# Patient Record
Sex: Male | Born: 1935 | Race: White | Hispanic: No | Marital: Married | Smoking: Never smoker
Health system: Southern US, Community
[De-identification: ages and names within clinical notes are randomized; demographics above are authoritative.]

## PROBLEM LIST (undated history)

## (undated) ENCOUNTER — Emergency Department (HOSPITAL_COMMUNITY): Discharge status: Against Medical Advice | Payer: Medicare Other

## (undated) DIAGNOSIS — I2510 Atherosclerotic heart disease of native coronary artery without angina pectoris: Secondary | ICD-10-CM

## (undated) DIAGNOSIS — E119 Type 2 diabetes mellitus without complications: Secondary | ICD-10-CM

## (undated) HISTORY — PX: SHOULDER SURGERY: SHX246

---

## 2011-11-27 ENCOUNTER — Emergency Department: Payer: Self-pay | Admitting: Emergency Medicine

## 2011-12-07 ENCOUNTER — Emergency Department: Payer: Self-pay | Admitting: Emergency Medicine

## 2013-08-27 ENCOUNTER — Inpatient Hospital Stay: Payer: Self-pay | Admitting: Internal Medicine

## 2013-08-27 LAB — BASIC METABOLIC PANEL
Anion Gap: 7 (ref 7–16)
BUN: 12 mg/dL (ref 7–18)
Calcium, Total: 9.1 mg/dL (ref 8.5–10.1)
Chloride: 106 mmol/L (ref 98–107)
Co2: 26 mmol/L (ref 21–32)
Creatinine: 1.22 mg/dL (ref 0.60–1.30)
EGFR (African American): >60
EGFR (Non-African Amer.): 57 — ABNORMAL LOW
Glucose: 154 mg/dL — ABNORMAL HIGH (ref 65–99)
Osmolality: 280 (ref 275–301)
Potassium: 4.2 mmol/L (ref 3.5–5.1)
Sodium: 139 mmol/L (ref 136–145)

## 2013-08-27 LAB — CBC WITH DIFFERENTIAL/PLATELET
Basophil #: 0 10*3/uL (ref 0.0–0.1)
Basophil %: 0.3 %
Eosinophil #: 0 10*3/uL (ref 0.0–0.7)
Eosinophil %: 0.4 %
HCT: 40.3 % (ref 40.0–52.0)
HGB: 12.9 g/dL — ABNORMAL LOW (ref 13.0–18.0)
Lymphocyte #: 0.4 10*3/uL — ABNORMAL LOW (ref 1.0–3.6)
Lymphocyte %: 4.3 %
MCH: 31.3 pg (ref 26.0–34.0)
MCHC: 32.1 g/dL (ref 32.0–36.0)
MCV: 98 fL (ref 80–100)
Monocyte #: 0.2 x10 3/mm (ref 0.2–1.0)
Monocyte %: 1.6 %
Neutrophil #: 8.9 10*3/uL — ABNORMAL HIGH (ref 1.4–6.5)
Neutrophil %: 93.4 %
Platelet: 180 10*3/uL (ref 150–440)
RBC: 4.13 10*6/uL — ABNORMAL LOW (ref 4.40–5.90)
RDW: 14.8 % — ABNORMAL HIGH (ref 11.5–14.5)
WBC: 9.6 10*3/uL (ref 3.8–10.6)

## 2013-08-27 LAB — TROPONIN I
Troponin-I: <0.02 ng/mL
Troponin-I: 0.04 ng/mL

## 2013-08-27 LAB — URINALYSIS, COMPLETE
Bilirubin,UR: NEGATIVE
Glucose,UR: >=500 mg/dL (ref 0–75)
Ketone: NEGATIVE
Leukocyte Esterase: NEGATIVE
Nitrite: NEGATIVE
Ph: 5 (ref 4.5–8.0)
Protein: NEGATIVE
RBC,UR: 2 /HPF (ref 0–5)
Specific Gravity: 1.021 (ref 1.003–1.030)
Squamous Epithelial: NONE SEEN
WBC UR: 1 /HPF (ref 0–5)

## 2013-08-27 LAB — CK-MB
CK-MB: 4 ng/mL — ABNORMAL HIGH (ref 0.5–3.6)
CK-MB: 1.4 ng/mL (ref 0.5–3.6)

## 2013-08-27 LAB — TSH: Thyroid Stimulating Horm: 2.07 u[IU]/mL

## 2013-08-27 LAB — MAGNESIUM: Magnesium: 1.4 mg/dL — ABNORMAL LOW

## 2013-08-28 LAB — CBC WITH DIFFERENTIAL/PLATELET
Basophil #: 0 10*3/uL (ref 0.0–0.1)
Basophil %: 0.2 %
Eosinophil #: 0 10*3/uL (ref 0.0–0.7)
Eosinophil %: 0 %
HCT: 34.9 % — ABNORMAL LOW (ref 40.0–52.0)
HGB: 11.4 g/dL — ABNORMAL LOW (ref 13.0–18.0)
Lymphocyte #: 0.5 10*3/uL — ABNORMAL LOW (ref 1.0–3.6)
Lymphocyte %: 3.7 %
MCH: 31.4 pg (ref 26.0–34.0)
MCHC: 32.6 g/dL (ref 32.0–36.0)
MCV: 96 fL (ref 80–100)
Monocyte #: 0.5 x10 3/mm (ref 0.2–1.0)
Monocyte %: 3.6 %
Neutrophil #: 13.2 10*3/uL — ABNORMAL HIGH (ref 1.4–6.5)
Neutrophil %: 92.5 %
Platelet: 140 10*3/uL — ABNORMAL LOW (ref 150–440)
RBC: 3.62 10*6/uL — ABNORMAL LOW (ref 4.40–5.90)
RDW: 14.8 % — ABNORMAL HIGH (ref 11.5–14.5)
WBC: 14.3 10*3/uL — ABNORMAL HIGH (ref 3.8–10.6)

## 2013-08-28 LAB — URINALYSIS, COMPLETE
Bilirubin,UR: NEGATIVE
Glucose,UR: 150 mg/dL (ref 0–75)
Hyaline Cast: 5
Ketone: NEGATIVE
Leukocyte Esterase: NEGATIVE
Nitrite: NEGATIVE
Ph: 5 (ref 4.5–8.0)
Protein: 30
RBC,UR: 6 /HPF (ref 0–5)
Specific Gravity: 1.018 (ref 1.003–1.030)
Squamous Epithelial: <1
WBC UR: 6 /HPF (ref 0–5)

## 2013-08-28 LAB — BASIC METABOLIC PANEL
Anion Gap: 11 (ref 7–16)
BUN: 18 mg/dL (ref 7–18)
Calcium, Total: 8.2 mg/dL — ABNORMAL LOW (ref 8.5–10.1)
Chloride: 106 mmol/L (ref 98–107)
Co2: 22 mmol/L (ref 21–32)
Creatinine: 1.49 mg/dL — ABNORMAL HIGH (ref 0.60–1.30)
EGFR (African American): 52 — ABNORMAL LOW
EGFR (Non-African Amer.): 45 — ABNORMAL LOW
Glucose: 162 mg/dL — ABNORMAL HIGH (ref 65–99)
Osmolality: 283 (ref 275–301)
Potassium: 3.5 mmol/L (ref 3.5–5.1)
Sodium: 139 mmol/L (ref 136–145)

## 2013-08-28 LAB — LIPID PANEL
Cholesterol: 89 mg/dL (ref 0–200)
HDL Cholesterol: 46 mg/dL (ref 40–60)
Ldl Cholesterol, Calc: 24 mg/dL (ref 0–100)
Triglycerides: 96 mg/dL (ref 0–200)
VLDL Cholesterol, Calc: 19 mg/dL (ref 5–40)

## 2013-08-28 LAB — TROPONIN I: Troponin-I: 0.09 ng/mL — ABNORMAL HIGH

## 2013-08-28 LAB — TSH: Thyroid Stimulating Horm: 1.44 u[IU]/mL

## 2013-08-28 LAB — APTT
Activated PTT: 39.2 secs — ABNORMAL HIGH (ref 23.6–35.9)
Activated PTT: >160 secs (ref 23.6–35.9)

## 2013-08-28 LAB — CK-MB: CK-MB: 1.2 ng/mL (ref 0.5–3.6)

## 2013-08-29 LAB — BASIC METABOLIC PANEL
Anion Gap: 14 (ref 7–16)
BUN: 30 mg/dL — ABNORMAL HIGH (ref 7–18)
Calcium, Total: 8 mg/dL — ABNORMAL LOW (ref 8.5–10.1)
Chloride: 103 mmol/L (ref 98–107)
Co2: 19 mmol/L — ABNORMAL LOW (ref 21–32)
Creatinine: 1.76 mg/dL — ABNORMAL HIGH (ref 0.60–1.30)
EGFR (African American): 42 — ABNORMAL LOW
EGFR (Non-African Amer.): 36 — ABNORMAL LOW
Glucose: 154 mg/dL — ABNORMAL HIGH (ref 65–99)
Osmolality: 281 (ref 275–301)
Potassium: 3.2 mmol/L — ABNORMAL LOW (ref 3.5–5.1)
Sodium: 136 mmol/L (ref 136–145)

## 2013-08-29 LAB — CBC WITH DIFFERENTIAL/PLATELET
Basophil #: 0 10*3/uL (ref 0.0–0.1)
Basophil %: 0.3 %
Eosinophil #: 0 10*3/uL (ref 0.0–0.7)
Eosinophil %: 0 %
HCT: 35.9 % — ABNORMAL LOW (ref 40.0–52.0)
HGB: 12 g/dL — ABNORMAL LOW (ref 13.0–18.0)
Lymphocyte #: 0.6 10*3/uL — ABNORMAL LOW (ref 1.0–3.6)
Lymphocyte %: 5.6 %
MCH: 31.7 pg (ref 26.0–34.0)
MCHC: 33.3 g/dL (ref 32.0–36.0)
MCV: 95 fL (ref 80–100)
Monocyte #: 0.3 x10 3/mm (ref 0.2–1.0)
Monocyte %: 2.8 %
Neutrophil #: 9.5 10*3/uL — ABNORMAL HIGH (ref 1.4–6.5)
Neutrophil %: 91.3 %
Platelet: 84 10*3/uL — ABNORMAL LOW (ref 150–440)
RBC: 3.77 10*6/uL — ABNORMAL LOW (ref 4.40–5.90)
RDW: 14.9 % — ABNORMAL HIGH (ref 11.5–14.5)
WBC: 10.4 10*3/uL (ref 3.8–10.6)

## 2013-08-29 LAB — APTT
Activated PTT: >160 secs (ref 23.6–35.9)
Activated PTT: >160 secs (ref 23.6–35.9)

## 2013-08-29 LAB — CLOSTRIDIUM DIFFICILE(ARMC)

## 2013-08-29 LAB — PLATELET COUNT: Platelet: 71 10*3/uL — ABNORMAL LOW (ref 150–440)

## 2013-08-29 LAB — MAGNESIUM: Magnesium: 2 mg/dL

## 2013-08-30 LAB — CBC WITH DIFFERENTIAL/PLATELET
Basophil #: 0 10*3/uL (ref 0.0–0.1)
Basophil %: 0.2 %
Eosinophil #: 0.3 10*3/uL (ref 0.0–0.7)
Eosinophil %: 2.1 %
HCT: 37.6 % — ABNORMAL LOW (ref 40.0–52.0)
HGB: 12.3 g/dL — ABNORMAL LOW (ref 13.0–18.0)
Lymphocyte #: 1.1 10*3/uL (ref 1.0–3.6)
Lymphocyte %: 8 %
MCH: 31.2 pg (ref 26.0–34.0)
MCHC: 32.7 g/dL (ref 32.0–36.0)
MCV: 96 fL (ref 80–100)
Monocyte #: 0.6 x10 3/mm (ref 0.2–1.0)
Monocyte %: 4.1 %
Neutrophil #: 11.5 10*3/uL — ABNORMAL HIGH (ref 1.4–6.5)
Neutrophil %: 85.6 %
Platelet: 67 10*3/uL — ABNORMAL LOW (ref 150–440)
RBC: 3.94 10*6/uL — ABNORMAL LOW (ref 4.40–5.90)
RDW: 14.7 % — ABNORMAL HIGH (ref 11.5–14.5)
WBC: 13.5 10*3/uL — ABNORMAL HIGH (ref 3.8–10.6)

## 2013-08-30 LAB — BASIC METABOLIC PANEL
Anion Gap: 8 (ref 7–16)
BUN: 28 mg/dL — ABNORMAL HIGH (ref 7–18)
Calcium, Total: 8.3 mg/dL — ABNORMAL LOW (ref 8.5–10.1)
Chloride: 107 mmol/L (ref 98–107)
Co2: 24 mmol/L (ref 21–32)
Creatinine: 1.28 mg/dL (ref 0.60–1.30)
EGFR (African American): >60
EGFR (Non-African Amer.): 54 — ABNORMAL LOW
Glucose: 158 mg/dL — ABNORMAL HIGH (ref 65–99)
Osmolality: 286 (ref 275–301)
Potassium: 3.8 mmol/L (ref 3.5–5.1)
Sodium: 139 mmol/L (ref 136–145)

## 2013-08-31 LAB — CBC WITH DIFFERENTIAL/PLATELET
Bands: 6 %
Bands: 7 %
Comment - H1-Com4: NORMAL
Comment - H1-Com5: NORMAL
HCT: 35.2 % — ABNORMAL LOW (ref 40.0–52.0)
HCT: 34.4 % — ABNORMAL LOW (ref 40.0–52.0)
HGB: 11.8 g/dL — ABNORMAL LOW (ref 13.0–18.0)
HGB: 11.4 g/dL — ABNORMAL LOW (ref 13.0–18.0)
Lymphocytes: 14 %
Lymphocytes: 8 %
MCH: 32.3 pg (ref 26.0–34.0)
MCH: 31.2 pg (ref 26.0–34.0)
MCHC: 33.6 g/dL (ref 32.0–36.0)
MCHC: 33.1 g/dL (ref 32.0–36.0)
MCV: 96 fL (ref 80–100)
MCV: 94 fL (ref 80–100)
Metamyelocyte: 1 %
Monocytes: 4 %
Monocytes: 2 %
Myelocyte: 1 %
Myelocyte: 1 %
NRBC/100 WBC: 1 /
Platelet: 63 10*3/uL — ABNORMAL LOW (ref 150–440)
Platelet: 68 10*3/uL — ABNORMAL LOW (ref 150–440)
RBC: 3.66 10*6/uL — ABNORMAL LOW (ref 4.40–5.90)
RBC: 3.65 10*6/uL — ABNORMAL LOW (ref 4.40–5.90)
RDW: 14.9 % — ABNORMAL HIGH (ref 11.5–14.5)
RDW: 14.7 % — ABNORMAL HIGH (ref 11.5–14.5)
Segmented Neutrophils: 74 %
Segmented Neutrophils: 82 %
WBC: 17.2 10*3/uL — ABNORMAL HIGH (ref 3.8–10.6)
WBC: 17.4 10*3/uL — ABNORMAL HIGH (ref 3.8–10.6)

## 2013-08-31 LAB — BASIC METABOLIC PANEL
Anion Gap: 9 (ref 7–16)
BUN: 28 mg/dL — ABNORMAL HIGH (ref 7–18)
Calcium, Total: 7.7 mg/dL — ABNORMAL LOW (ref 8.5–10.1)
Chloride: 110 mmol/L — ABNORMAL HIGH (ref 98–107)
Co2: 23 mmol/L (ref 21–32)
Creatinine: 1.11 mg/dL (ref 0.60–1.30)
EGFR (African American): >60
EGFR (Non-African Amer.): >60
Glucose: 173 mg/dL — ABNORMAL HIGH (ref 65–99)
Osmolality: 293 (ref 275–301)
Potassium: 4 mmol/L (ref 3.5–5.1)
Sodium: 142 mmol/L (ref 136–145)

## 2013-08-31 LAB — PRO B NATRIURETIC PEPTIDE: B-Type Natriuretic Peptide: 13978 pg/mL — ABNORMAL HIGH (ref 0–450)

## 2013-08-31 LAB — TROPONIN I
Troponin-I: 0.17 ng/mL — ABNORMAL HIGH
Troponin-I: 0.2 ng/mL — ABNORMAL HIGH

## 2013-08-31 LAB — CULTURE, BLOOD (SINGLE)

## 2013-09-01 LAB — CBC WITH DIFFERENTIAL/PLATELET
Bands: 3 %
HCT: 34 % — ABNORMAL LOW (ref 40.0–52.0)
HGB: 11.3 g/dL — ABNORMAL LOW (ref 13.0–18.0)
Lymphocytes: 12 %
MCH: 31.9 pg (ref 26.0–34.0)
MCHC: 33.3 g/dL (ref 32.0–36.0)
MCV: 96 fL (ref 80–100)
Metamyelocyte: 1 %
Monocytes: 3 %
Myelocyte: 1 %
Platelet: 64 10*3/uL — ABNORMAL LOW (ref 150–440)
RBC: 3.55 10*6/uL — ABNORMAL LOW (ref 4.40–5.90)
RDW: 14.7 % — ABNORMAL HIGH (ref 11.5–14.5)
Segmented Neutrophils: 80 %
WBC: 17.4 10*3/uL — ABNORMAL HIGH (ref 3.8–10.6)

## 2013-09-01 LAB — BASIC METABOLIC PANEL
Anion Gap: 4 — ABNORMAL LOW (ref 7–16)
BUN: 30 mg/dL — ABNORMAL HIGH (ref 7–18)
Calcium, Total: 8.1 mg/dL — ABNORMAL LOW (ref 8.5–10.1)
Chloride: 115 mmol/L — ABNORMAL HIGH (ref 98–107)
Co2: 26 mmol/L (ref 21–32)
Creatinine: 1.4 mg/dL — ABNORMAL HIGH (ref 0.60–1.30)
EGFR (African American): 56 — ABNORMAL LOW
EGFR (Non-African Amer.): 48 — ABNORMAL LOW
Glucose: 195 mg/dL — ABNORMAL HIGH (ref 65–99)
Osmolality: 300 (ref 275–301)
Potassium: 3.4 mmol/L — ABNORMAL LOW (ref 3.5–5.1)
Sodium: 145 mmol/L (ref 136–145)

## 2013-09-01 LAB — TROPONIN I: Troponin-I: 0.17 ng/mL — ABNORMAL HIGH

## 2013-09-01 LAB — MAGNESIUM: Magnesium: 2.6 mg/dL — ABNORMAL HIGH

## 2013-09-03 LAB — BASIC METABOLIC PANEL
Anion Gap: 5 — ABNORMAL LOW (ref 7–16)
BUN: 35 mg/dL — ABNORMAL HIGH (ref 7–18)
Calcium, Total: 9.2 mg/dL (ref 8.5–10.1)
Chloride: 126 mmol/L — ABNORMAL HIGH (ref 98–107)
Co2: 29 mmol/L (ref 21–32)
Creatinine: 1.5 mg/dL — ABNORMAL HIGH (ref 0.60–1.30)
EGFR (African American): 51 — ABNORMAL LOW
EGFR (Non-African Amer.): 44 — ABNORMAL LOW
Glucose: 226 mg/dL — ABNORMAL HIGH (ref 65–99)
Osmolality: 332 (ref 275–301)
Potassium: 3.6 mmol/L (ref 3.5–5.1)
Sodium: 160 mmol/L (ref 136–145)

## 2013-09-03 LAB — CBC WITH DIFFERENTIAL/PLATELET
Basophil #: 0 10*3/uL (ref 0.0–0.1)
Basophil %: 0.2 %
Eosinophil #: 0.1 10*3/uL (ref 0.0–0.7)
Eosinophil %: 0.5 %
HCT: 41.1 % (ref 40.0–52.0)
HGB: 13.7 g/dL (ref 13.0–18.0)
Lymphocyte #: 2.7 10*3/uL (ref 1.0–3.6)
Lymphocyte %: 15.1 %
MCH: 31.7 pg (ref 26.0–34.0)
MCHC: 33.3 g/dL (ref 32.0–36.0)
MCV: 95 fL (ref 80–100)
Monocyte #: 1.3 x10 3/mm — ABNORMAL HIGH (ref 0.2–1.0)
Monocyte %: 7.1 %
Neutrophil #: 13.5 10*3/uL — ABNORMAL HIGH (ref 1.4–6.5)
Neutrophil %: 77.1 %
Platelet: 125 10*3/uL — ABNORMAL LOW (ref 150–440)
RBC: 4.31 10*6/uL — ABNORMAL LOW (ref 4.40–5.90)
RDW: 15.4 % — ABNORMAL HIGH (ref 11.5–14.5)
WBC: 17.6 10*3/uL — ABNORMAL HIGH (ref 3.8–10.6)

## 2013-09-03 LAB — CULTURE, BLOOD (SINGLE)

## 2013-09-03 LAB — SODIUM: Sodium: 159 mmol/L — ABNORMAL HIGH (ref 136–145)

## 2013-09-04 LAB — SODIUM: Sodium: 159 mmol/L — ABNORMAL HIGH

## 2013-09-04 LAB — CBC WITH DIFFERENTIAL/PLATELET
Basophil #: 0.1 10*3/uL (ref 0.0–0.1)
Basophil %: 0.4 %
Eosinophil #: 0.2 10*3/uL (ref 0.0–0.7)
Eosinophil %: 1.2 %
HCT: 37.5 % — ABNORMAL LOW (ref 40.0–52.0)
HGB: 12.3 g/dL — ABNORMAL LOW (ref 13.0–18.0)
Lymphocyte #: 2.5 10*3/uL (ref 1.0–3.6)
Lymphocyte %: 12.4 %
MCH: 31.5 pg (ref 26.0–34.0)
MCHC: 32.7 g/dL (ref 32.0–36.0)
MCV: 96 fL (ref 80–100)
Monocyte #: 1 x10 3/mm (ref 0.2–1.0)
Monocyte %: 5.2 %
Neutrophil #: 16 10*3/uL — ABNORMAL HIGH (ref 1.4–6.5)
Neutrophil %: 80.8 %
Platelet: 144 10*3/uL — ABNORMAL LOW (ref 150–440)
RBC: 3.89 10*6/uL — ABNORMAL LOW (ref 4.40–5.90)
RDW: 15.7 % — ABNORMAL HIGH (ref 11.5–14.5)
WBC: 19.8 10*3/uL — ABNORMAL HIGH (ref 3.8–10.6)

## 2013-09-04 LAB — BASIC METABOLIC PANEL
BUN: 31 mg/dL — ABNORMAL HIGH (ref 7–18)
Calcium, Total: 8.7 mg/dL (ref 8.5–10.1)
Chloride: 126 mmol/L — ABNORMAL HIGH (ref 98–107)
Co2: 28 mmol/L (ref 21–32)
Creatinine: 1.56 mg/dL — ABNORMAL HIGH (ref 0.60–1.30)
EGFR (African American): 49 — ABNORMAL LOW
EGFR (Non-African Amer.): 42 — ABNORMAL LOW
Glucose: 272 mg/dL — ABNORMAL HIGH (ref 65–99)
Potassium: 3.2 mmol/L — ABNORMAL LOW (ref 3.5–5.1)
Sodium: >160 mmol/L (ref 136–145)

## 2013-09-05 LAB — BASIC METABOLIC PANEL
Anion Gap: 8 (ref 7–16)
BUN: 27 mg/dL — ABNORMAL HIGH (ref 7–18)
Calcium, Total: 8.1 mg/dL — ABNORMAL LOW (ref 8.5–10.1)
Chloride: 122 mmol/L — ABNORMAL HIGH (ref 98–107)
Co2: 29 mmol/L (ref 21–32)
Creatinine: 1.43 mg/dL — ABNORMAL HIGH (ref 0.60–1.30)
EGFR (African American): 54 — ABNORMAL LOW
EGFR (Non-African Amer.): 47 — ABNORMAL LOW
Glucose: 270 mg/dL — ABNORMAL HIGH (ref 65–99)
Osmolality: 329 (ref 275–301)
Potassium: 3.1 mmol/L — ABNORMAL LOW (ref 3.5–5.1)
Sodium: 159 mmol/L — ABNORMAL HIGH (ref 136–145)

## 2013-09-05 LAB — SODIUM
Sodium: 129 mmol/L — ABNORMAL LOW (ref 136–145)
Sodium: 157 mmol/L — ABNORMAL HIGH (ref 136–145)

## 2013-09-06 LAB — BASIC METABOLIC PANEL
Anion Gap: 6 — ABNORMAL LOW (ref 7–16)
BUN: 25 mg/dL — ABNORMAL HIGH (ref 7–18)
Calcium, Total: 8.2 mg/dL — ABNORMAL LOW (ref 8.5–10.1)
Chloride: 121 mmol/L — ABNORMAL HIGH (ref 98–107)
Co2: 28 mmol/L (ref 21–32)
Creatinine: 1.3 mg/dL (ref 0.60–1.30)
EGFR (African American): >60
EGFR (Non-African Amer.): 53 — ABNORMAL LOW
Glucose: 279 mg/dL — ABNORMAL HIGH (ref 65–99)
Osmolality: 322 (ref 275–301)
Potassium: 3.2 mmol/L — ABNORMAL LOW (ref 3.5–5.1)
Sodium: 155 mmol/L — ABNORMAL HIGH (ref 136–145)

## 2013-09-07 ENCOUNTER — Encounter: Payer: Self-pay | Admitting: *Deleted

## 2013-09-07 LAB — MAGNESIUM: Magnesium: 2.4 mg/dL

## 2013-09-07 LAB — BASIC METABOLIC PANEL
Anion Gap: 6 — ABNORMAL LOW (ref 7–16)
BUN: 24 mg/dL — ABNORMAL HIGH (ref 7–18)
Calcium, Total: 8.3 mg/dL — ABNORMAL LOW (ref 8.5–10.1)
Chloride: 121 mmol/L — ABNORMAL HIGH (ref 98–107)
Co2: 27 mmol/L (ref 21–32)
Creatinine: 1.32 mg/dL — ABNORMAL HIGH (ref 0.60–1.30)
EGFR (African American): 60 — ABNORMAL LOW
EGFR (Non-African Amer.): 52 — ABNORMAL LOW
Glucose: 317 mg/dL — ABNORMAL HIGH (ref 65–99)
Osmolality: 322 (ref 275–301)
Potassium: 3.8 mmol/L (ref 3.5–5.1)
Sodium: 154 mmol/L — ABNORMAL HIGH (ref 136–145)

## 2013-09-07 NOTE — PMR Pre-admission (Signed)
[facility] PMR Admission Coordinator Pre-Admission Assessment  Patient: Elijah [name] is an 80 y.o., male MRN: [id number] DOB: [birth year] Height: 6' 2"  (188 cm) Weight: 93.895 kg (207 lb)  Insurance Information HMO: yes    PPO:      PCP:      IPA:      80/20:      OTHER:  PRIMARY: [employer] Medicare Complete Plan 2      Policy#: [id number]      Subscriber: self CM Name: Anette Guarneri      Phone#: [phone], ext [phone]     Fax#: [phone] Approval given on 09-06-13 for seven days. However, pt was not admitted to inpatient rehab until 09-11-13. Updates due on 09-14-13. Pre-Cert#: [id number]      Employer: retired  Benefits:  Phone #: [phone]     Name: Blenda Bridegroom. Date: 03-22-13     Deduct: none      Out of Pocket Max: [phone] (met $66.80)      Life Max:  none CIR: $430/day for days 1-4; pre-auth needed      SNF: $0/day for days 1-20; $155/day for days 21-64; $0/day for days 65-100 (100 day visit max; pre-auth needed) Outpatient: covered     Co-Pay: $40 copay, no visit limits, based on medical necessity Home Health: covered      Co-Pay: none, no visit limits, based on medical necessity DME: 80%     Co-Pay: 20% Providers: in network  Emergency Contact Information Contact Information   Name Relation Home Work [place] Son [phone]  [place] [phone]        Current Medical History  Patient Admitting Diagnosis: bilateral CVAs  History of Present Illness: This 80 year old patient with previous history of diabetes, had sudden onset of shaking uncontrollably with slurred speech while he was putting some laminate floor on his house. His blood sugar was checked and it was in the 130's. EMS was called. When they arrived, they noticed him to be in irregular heart rhythm. When the patient arrived in ED at [facility], he was noticed to have frequent PVCs and junctional rhythm with some tachycardia. He denied any chest  pain or shortness of breath. Initial MRI brain revealed a very small right frontal infarct on 08-28-13. Pt had confusion and moderate dysarthia with less responsiveness overall. Work up also revealed gram positive cocci (sepsis with methicillin-sensitive staph aureus bacteremia) and pt was switched over to Ancef per infectious disease. Per infectious disease, he will need a total of 4 weeks of antibiotics from date of 6/10 onward. Since admission at [facility], pt was transferred to and from CCU secondary to worsening respiratory status due to aspiration, now improved and on 2L supplemental O2. Pt had a failed attempt at TEE and had bleeding from palate area due to laceration, needing cautery from ENT. Pt also with severe hypernatremia (as high as 160) and sodium has been trending down, now 147 as of 09-10-13 with IVF and on D5W. Repeat MRI brain on 6-19 demonstrated multiple small areas of acute infarct in bilateral cerebral white matter, measuring less than 1 cm each. Pt has had varying levels of alertness and was started on Provigil. Acute inpatient rehab was recommended by therapy and pt and supportive family are agreeable to pursue inpatient rehab to maximize pt's functional recovery.  Patient's medical record from [facility] has been reviewed by the rehabilitation admission coordinator and physician.  Past  Medical History  Diabetes mellitus II, hypertension and hyperlipidemia  Family History  family history is not on file.  Prior Rehab/Hospitalizations: none   Current Medications See MAR  Patients Current Diet:  Had modified barium swallow study on 09-10-13. Current diet recommendations are for: dys 1, nectar thick  Precautions / Restrictions Precautions Precautions: Fall (Aspiration precautions)   Prior Activity Level Community (5-7x/wk): pt was very active prior to CVA, driving, enjoys his garden and managing small corn crop. Pt was also tiling his floors.  Home Assistive  Devices / Equipment Home Assistive Devices/Equipment: None   Prior Functional Level Current Functional Level  Bed Mobility  Independent  Max assist (max assist with rolling, max to dependent assist with supine)   Transfers  Independent  Max assist;Total assist (max to total assist of 2 for squat pivot transfer to chair)   Mobility - Walk/Wheelchair  Independent  Other (not assessed at this time.)   Upper Body Dressing  Independent  Other (not assessed)   Lower Body Dressing  Independent  Other (not assessed)   Grooming  Independent    Not assessed  Eating/Drinking  Independent    Needs assist from family, not assessed  Toilet Transfer  Independent  Other (not assessed)   Bladder Continence   WFL    incontinent  Bowel Management  WFL    Last BM on 09-09-13  Stair Climbing   Independent  Other (not assessed)   Communication  WFL  dysarthric   Memory  WFL  unable to clearly assess   Cooking/Meal Prep  Independent      Housework  Independent    Money Management  Independent    Driving   Independent     Special needs/care consideration BiPAP/CPAP no  CPM no  Continuous Drip IV no  Dialysis no         Life Vest no  Oxygen - currently on 2 L O2 Special Bed no  Trach Size no  Wound Vac (area) no       Skin - right ear with skin irration, both ears red                              Bowel mgmt: incontinent Bladder mgmt: last BM on 09-09-13 Diabetic mgmt - managed with meds at home  Previous Home Environment Living Arrangements: Spouse/significant other  Lives With: Spouse Available Help at Discharge: Family;Available 24 hours/day (wife can provide 24-7 supervision, 2 sons and daughter-in-law can provide physical assistance, son does work and other son volunteers for [employer].) Type of Home: House Home Layout: One level Home Access: Stairs to enter Entrance Stairs-Rails: Psychiatric nurse of Steps: 3  Discharge Living  Setting Plans for Discharge Living Setting: Patient's home Type of Home at Discharge: House Discharge Home Layout: One level Discharge Home Access: Stairs to enter Entrance Stairs-Rails: Right;Left Entrance Stairs-Number of Steps: 3 Does the patient have any problems obtaining your medications?: No  Social/Family/Support Systems Patient Roles: Spouse Contact Information: wife Vaughan Basta and son Alvester Chou are primary contacts Anticipated Caregiver: wife and 2 sons Alvester Chou) (wife will be limited in amount of physical assist she can provide) Anticipated Caregiver's Contact Information: see above Ability/Limitations of Caregiver: wife can provide supervision and poss. light assistance. 2 sons and daughter-in-law can provide physical assist. Caregiver Availability: 24/7 Discharge Plan Discussed with Primary Caregiver: Yes (discussed with wife and son in person and by phone 6-18 & 6-19. Left voicemails  with pt's son on 6-22. Spoke with pt's son on 09-11-13.) Is Caregiver In Agreement with Plan?: Yes Does Caregiver/Family have Issues with Lodging/Transportation while Pt is in Rehab?: No  Goals/Additional Needs Patient/Family Goal for Rehab: Minimal to moderate assistance with PT/OT and minimal assistance with SLP Expected length of stay: 14-18 days Cultural Considerations: none Dietary Needs: dys 1, nectar thick, high aspiration risk Equipment Needs: to be determined Pt/Family Agrees to Admission and willing to participate: Yes Program Orientation Provided & Reviewed with Pt/Caregiver Including Roles  & Responsibilities: Yes  Note: pt may possibly need further skilled nursing care at end of rehab stay, depending on pt's overall progress with activity and family's ability to care for pt. Dr. Naaman Plummer is aware of this possibility and this discussion was made with pt's son and wife.  Patient Condition: I met with pt and his wife at [facility] on 6-18 to explain the possibility of  inpatient rehabilitation. This 80 year old patient was previously independent in mobility and in basic ADLs prior to his recent bilateral cerebral strokes. Pt is currently requiring maximal assistance of 2 to sit on edge of bed and pt has balance issues maintaining midline, needing moderate to contact guard assistance to keep sitting balance. He needed max to total assist of 2 for sit pivot transfer to chair. His ADL functions are quite limited by his decreased activity tolerance and were not able to be determined during OT eval. In addition, pt has had skilled speech therapy to address his dysphagia needs (was on a dysphagia 1 with nectar thick liquids). He is on strict aspiration precautions and will benefit from further skilled SLP services to progress his diet following his barium swallow study and to determine cognitive issues following his CVA. He will benefit greatly from the multi-disciplinary team of skilled PT, OT, SLP and rehab nursing to maximize his functional return following this new CVA. PT, OT and rehab nursing can focus on increasing strength for greater independence in bed mobility, transfers and pre-gait skills. Rehab nursing can focus on pt/family teaching with medication needs and bowel and bladder care as well as monitoring with skin issues. In addition, pt will benefit from further rehab physician intervention to monitor his antibiotic needs in light of sepsis and further anti-coagulation needs. Discussed case with Dr. Naaman Plummer who stated that pt is a good candidate for inpatient rehab and received medical clearance from Dr. Manuella Ghazi at [facility] on 09-11-13. Pt and his family are motivated to come to inpatient rehab and will benefit from the intensive services of skilled therapy under rehab physician guidance. Pt will be admitted today on 09-11-13.  Preadmission Screen Completed By:  Nanetta Batty, PT 09/11/2013 0922 am ______________________________________________________________________    Discussed status with Dr. Naaman Plummer on 09-11-13 at 0922am and received telephone approval for admission today.  Admission Coordinator:  Nanetta Batty, PT time 0922/Date 09-11-13   Assessment/Plan: Diagnosis: bilateral CVA's 1. Does the need for close, 24 hr/day  Medical supervision in concert with the patient's rehab needs make it unreasonable for this patient to be served in a less intensive setting? Yes 2. Co-Morbidities requiring supervision/potential complications: dm, htn, sepsis 3. Due to bladder management, bowel management, safety, skin/wound care, disease management, medication administration, pain management and patient education, does the patient require 24 hr/day rehab nursing? Yes 4. Does the patient require coordinated care of a physician, rehab nurse, PT (1-2 hrs/day, 5 days/week), OT (1-2 hrs/day, 5 days/week) and SLP (1-2 hrs/day, 5 days/week) to address  physical and functional deficits in the context of the above medical diagnosis(es)? Yes Addressing deficits in the following areas: balance, endurance, locomotion, strength, transferring, bowel/bladder control, bathing, dressing, feeding, grooming, toileting, cognition, speech, language, swallowing and psychosocial support 5. Can the patient actively participate in an intensive therapy program of at least 3 hrs of therapy 5 days a week? Yes 6. The potential for patient to make measurable gains while on inpatient rehab is excellent 7. Anticipated functional outcomes upon discharge from inpatients are: min assist and mod assist PT, min assist and mod assist OT, min assist and mod assist SLP 8. Estimated rehab length of stay to reach the above functional goals is: 14-18 9. Does the patient have adequate social supports to accommodate these discharge functional goals? Yes 10. Anticipated D/C setting: Home 11. Anticipated post D/C treatments: HH therapy and Outpatient therapy 12. Overall Rehab/Functional Prognosis:  excellent    RECOMMENDATIONS: This patient's condition is appropriate for continued rehabilitative care in the following setting: CIR Patient has agreed to participate in recommended program. Yes Note that insurance prior authorization may be required for reimbursement for recommended care.  Comment:  Admitting to CIR today.  Meredith Staggers, MD, [place], [place] 09/11/2013

## 2013-09-08 LAB — BASIC METABOLIC PANEL
Anion Gap: 5 — ABNORMAL LOW (ref 7–16)
BUN: 24 mg/dL — ABNORMAL HIGH (ref 7–18)
Calcium, Total: 8.6 mg/dL (ref 8.5–10.1)
Chloride: 119 mmol/L — ABNORMAL HIGH (ref 98–107)
Co2: 26 mmol/L (ref 21–32)
Creatinine: 1.4 mg/dL — ABNORMAL HIGH (ref 0.60–1.30)
EGFR (African American): 56 — ABNORMAL LOW
EGFR (Non-African Amer.): 48 — ABNORMAL LOW
Glucose: 357 mg/dL — ABNORMAL HIGH (ref 65–99)
Osmolality: 316 (ref 275–301)
Potassium: 4.2 mmol/L (ref 3.5–5.1)
Sodium: 150 mmol/L — ABNORMAL HIGH (ref 136–145)

## 2013-09-08 LAB — MAGNESIUM: Magnesium: 2.5 mg/dL — ABNORMAL HIGH

## 2013-09-09 LAB — CBC WITH DIFFERENTIAL/PLATELET
Basophil #: 0.1 10*3/uL (ref 0.0–0.1)
Basophil %: 0.4 %
Eosinophil #: 0.3 10*3/uL (ref 0.0–0.7)
Eosinophil %: 1.6 %
HCT: 33 % — ABNORMAL LOW (ref 40.0–52.0)
HGB: 10.1 g/dL — ABNORMAL LOW (ref 13.0–18.0)
Lymphocyte #: 1.9 10*3/uL (ref 1.0–3.6)
Lymphocyte %: 12 %
MCH: 30 pg (ref 26.0–34.0)
MCHC: 30.8 g/dL — ABNORMAL LOW (ref 32.0–36.0)
MCV: 98 fL (ref 80–100)
Monocyte #: 0.6 x10 3/mm (ref 0.2–1.0)
Monocyte %: 3.6 %
Neutrophil #: 13.4 10*3/uL — ABNORMAL HIGH (ref 1.4–6.5)
Neutrophil %: 82.4 %
Platelet: 166 10*3/uL (ref 150–440)
RBC: 3.38 10*6/uL — ABNORMAL LOW (ref 4.40–5.90)
RDW: 15.3 % — ABNORMAL HIGH (ref 11.5–14.5)
WBC: 16.2 10*3/uL — ABNORMAL HIGH (ref 3.8–10.6)

## 2013-09-09 LAB — BASIC METABOLIC PANEL
Anion Gap: 8 (ref 7–16)
BUN: 26 mg/dL — ABNORMAL HIGH (ref 7–18)
Calcium, Total: 8.6 mg/dL (ref 8.5–10.1)
Chloride: 115 mmol/L — ABNORMAL HIGH (ref 98–107)
Co2: 24 mmol/L (ref 21–32)
Creatinine: 1.47 mg/dL — ABNORMAL HIGH (ref 0.60–1.30)
EGFR (African American): 53 — ABNORMAL LOW
EGFR (Non-African Amer.): 45 — ABNORMAL LOW
Glucose: 268 mg/dL — ABNORMAL HIGH (ref 65–99)
Osmolality: 307 (ref 275–301)
Potassium: 4.1 mmol/L (ref 3.5–5.1)
Sodium: 147 mmol/L — ABNORMAL HIGH (ref 136–145)

## 2013-09-11 ENCOUNTER — Inpatient Hospital Stay (HOSPITAL_COMMUNITY)
Admission: RE | Admit: 2013-09-11 | Discharge: 2013-10-10 | Disposition: A | Discharge status: Skilled Nursing Facility | Payer: Medicare Other | Source: Other Acute Inpatient Hospital | Attending: Physical Medicine & Rehabilitation | Admitting: Physical Medicine & Rehabilitation

## 2013-09-11 ENCOUNTER — Inpatient Hospital Stay (HOSPITAL_COMMUNITY): Payer: Medicare Other

## 2013-09-11 ENCOUNTER — Other Ambulatory Visit (HOSPITAL_COMMUNITY): Payer: Self-pay | Admitting: Physician Assistant

## 2013-09-11 ENCOUNTER — Inpatient Hospital Stay (HOSPITAL_COMMUNITY)
Admission: RE | Admit: 2013-09-11 | Discharge: 2013-09-11 | DRG: 945 | Disposition: A | Discharge status: Skilled Nursing Facility | Payer: Medicare Other | Source: Intra-hospital | Attending: Physical Medicine & Rehabilitation | Admitting: Physical Medicine & Rehabilitation

## 2013-09-11 DIAGNOSIS — I4891 Unspecified atrial fibrillation: Secondary | ICD-10-CM | POA: Diagnosis present

## 2013-09-11 DIAGNOSIS — K4020 Bilateral inguinal hernia, without obstruction or gangrene, not specified as recurrent: Secondary | ICD-10-CM

## 2013-09-11 DIAGNOSIS — I639 Cerebral infarction, unspecified: Secondary | ICD-10-CM | POA: Diagnosis present

## 2013-09-11 DIAGNOSIS — E1149 Type 2 diabetes mellitus with other diabetic neurological complication: Secondary | ICD-10-CM | POA: Diagnosis present

## 2013-09-11 DIAGNOSIS — R1310 Dysphagia, unspecified: Secondary | ICD-10-CM | POA: Diagnosis present

## 2013-09-11 DIAGNOSIS — Z5189 Encounter for other specified aftercare: Principal | ICD-10-CM

## 2013-09-11 DIAGNOSIS — R5381 Other malaise: Secondary | ICD-10-CM | POA: Diagnosis present

## 2013-09-11 DIAGNOSIS — Z79899 Other long term (current) drug therapy: Secondary | ICD-10-CM

## 2013-09-11 DIAGNOSIS — R4789 Other speech disturbances: Secondary | ICD-10-CM | POA: Diagnosis present

## 2013-09-11 DIAGNOSIS — I10 Essential (primary) hypertension: Secondary | ICD-10-CM | POA: Diagnosis present

## 2013-09-11 DIAGNOSIS — I6340 Cerebral infarction due to embolism of unspecified cerebral artery: Secondary | ICD-10-CM

## 2013-09-11 DIAGNOSIS — R5383 Other fatigue: Secondary | ICD-10-CM

## 2013-09-11 DIAGNOSIS — N5089 Other specified disorders of the male genital organs: Secondary | ICD-10-CM | POA: Diagnosis present

## 2013-09-11 DIAGNOSIS — A4901 Methicillin susceptible Staphylococcus aureus infection, unspecified site: Secondary | ICD-10-CM

## 2013-09-11 DIAGNOSIS — E1142 Type 2 diabetes mellitus with diabetic polyneuropathy: Secondary | ICD-10-CM | POA: Diagnosis present

## 2013-09-11 DIAGNOSIS — I6322 Cerebral infarction due to unspecified occlusion or stenosis of basilar arteries: Secondary | ICD-10-CM

## 2013-09-11 DIAGNOSIS — E785 Hyperlipidemia, unspecified: Secondary | ICD-10-CM

## 2013-09-11 DIAGNOSIS — F17200 Nicotine dependence, unspecified, uncomplicated: Secondary | ICD-10-CM | POA: Diagnosis present

## 2013-09-11 DIAGNOSIS — R945 Abnormal results of liver function studies: Secondary | ICD-10-CM

## 2013-09-11 DIAGNOSIS — R7989 Other specified abnormal findings of blood chemistry: Secondary | ICD-10-CM

## 2013-09-11 LAB — CBC WITH DIFFERENTIAL/PLATELET
Basophil #: 0 10*3/uL (ref 0.0–0.1)
Basophil %: 0.4 %
Eosinophil #: 0.2 10*3/uL (ref 0.0–0.7)
Eosinophil %: 1.6 %
HCT: 24.3 % — ABNORMAL LOW (ref 40.0–52.0)
HGB: 8 g/dL — ABNORMAL LOW (ref 13.0–18.0)
Lymphocyte #: 1.9 10*3/uL (ref 1.0–3.6)
Lymphocyte %: 17.6 %
MCH: 32 pg (ref 26.0–34.0)
MCHC: 33.1 g/dL (ref 32.0–36.0)
MCV: 97 fL (ref 80–100)
Monocyte #: 0.5 x10 3/mm (ref 0.2–1.0)
Monocyte %: 4.9 %
Neutrophil #: 7.9 10*3/uL — ABNORMAL HIGH (ref 1.4–6.5)
Neutrophil %: 75.5 %
Platelet: 149 10*3/uL — ABNORMAL LOW (ref 150–440)
RBC: 2.51 10*6/uL — ABNORMAL LOW (ref 4.40–5.90)
RDW: 15.2 % — ABNORMAL HIGH (ref 11.5–14.5)
WBC: 10.5 10*3/uL (ref 3.8–10.6)

## 2013-09-11 LAB — MRSA PCR SCREENING: MRSA by PCR: NEGATIVE

## 2013-09-11 LAB — BASIC METABOLIC PANEL
Anion Gap: 6 — ABNORMAL LOW (ref 7–16)
BUN: 28 mg/dL — ABNORMAL HIGH (ref 7–18)
Calcium, Total: 8.7 mg/dL (ref 8.5–10.1)
Chloride: 115 mmol/L — ABNORMAL HIGH (ref 98–107)
Co2: 24 mmol/L (ref 21–32)
Creatinine: 1.31 mg/dL — ABNORMAL HIGH (ref 0.60–1.30)
EGFR (African American): >60
EGFR (Non-African Amer.): 52 — ABNORMAL LOW
Glucose: 278 mg/dL — ABNORMAL HIGH (ref 65–99)
Osmolality: 304 (ref 275–301)
Potassium: 4.4 mmol/L (ref 3.5–5.1)
Sodium: 145 mmol/L (ref 136–145)

## 2013-09-11 LAB — GLUCOSE, CAPILLARY: Glucose-Capillary: 231 mg/dL — ABNORMAL HIGH (ref 70–99)

## 2013-09-11 MED ORDER — CEFAZOLIN SODIUM-DEXTROSE 2-3 GM-% IV SOLR
2.0000 g | Freq: Three times a day (TID) | INTRAVENOUS | Status: DC
  Administered 2013-09-11 – 2013-09-27 (×47): 2 g via INTRAVENOUS
  Filled 2013-09-11 (×51): qty 50

## 2013-09-11 MED ORDER — ONDANSETRON HCL 4 MG/2ML IJ SOLN
4.0000 mg | Freq: Four times a day (QID) | INTRAMUSCULAR | Status: DC | PRN
  Administered 2013-10-02: 4 mg via INTRAVENOUS
  Filled 2013-09-11: qty 2

## 2013-09-11 MED ORDER — HYDRALAZINE HCL 10 MG PO TABS
10.0000 mg | ORAL_TABLET | Freq: Four times a day (QID) | ORAL | Status: DC
  Administered 2013-09-11 – 2013-10-10 (×110): 10 mg via ORAL
  Filled 2013-09-11 (×121): qty 1

## 2013-09-11 MED ORDER — ACETAMINOPHEN 325 MG PO TABS
325.0000 mg | ORAL_TABLET | ORAL | Status: DC | PRN
  Administered 2013-09-19 – 2013-10-01 (×16): 650 mg via ORAL
  Filled 2013-09-11 (×16): qty 2

## 2013-09-11 MED ORDER — INSULIN DETEMIR 100 UNIT/ML ~~LOC~~ SOLN
20.0000 [IU] | Freq: Every day | SUBCUTANEOUS | Status: DC
  Administered 2013-09-11 – 2013-09-12 (×2): 20 [IU] via SUBCUTANEOUS
  Filled 2013-09-11 (×3): qty 0.2

## 2013-09-11 MED ORDER — AMLODIPINE BESYLATE 10 MG PO TABS
10.0000 mg | ORAL_TABLET | Freq: Every day | ORAL | Status: DC
  Administered 2013-09-12 – 2013-10-10 (×29): 10 mg via ORAL
  Filled 2013-09-11 (×32): qty 1

## 2013-09-11 MED ORDER — METOPROLOL TARTRATE 25 MG PO TABS
25.0000 mg | ORAL_TABLET | Freq: Three times a day (TID) | ORAL | Status: DC
  Administered 2013-09-11 – 2013-10-10 (×84): 25 mg via ORAL
  Filled 2013-09-11 (×91): qty 1

## 2013-09-11 MED ORDER — ONDANSETRON HCL 4 MG PO TABS: 4.0000 mg | ORAL_TABLET | Freq: Four times a day (QID) | ORAL | Status: DC | PRN

## 2013-09-11 MED ORDER — APIXABAN 5 MG PO TABS
5.0000 mg | ORAL_TABLET | Freq: Two times a day (BID) | ORAL | Status: DC
  Administered 2013-09-11 – 2013-09-15 (×8): 5 mg via ORAL
  Filled 2013-09-11 (×10): qty 1

## 2013-09-11 MED ORDER — RESOURCE THICKENUP CLEAR PO POWD
ORAL | Status: DC | PRN
  Filled 2013-09-11: qty 125

## 2013-09-11 MED ORDER — ATORVASTATIN CALCIUM 40 MG PO TABS
40.0000 mg | ORAL_TABLET | Freq: Every day | ORAL | Status: DC
  Administered 2013-09-11 – 2013-09-13 (×3): 40 mg via ORAL
  Filled 2013-09-11 (×4): qty 1

## 2013-09-11 MED ORDER — SORBITOL 70 % SOLN: 30.0000 mL | Freq: Every day | Status: DC | PRN

## 2013-09-11 NOTE — Progress Notes (Signed)
Pt arrived to unit at 1330 via carelink from [facility]. Awaiting family arrival to complete admission process. Assessed skin with WOC nurse consulted.

## 2013-09-11 NOTE — Progress Notes (Signed)
Patient with excoriated buttocks as well as scrotal swelling noted upon history and physical with nurse at bedside. Will consult WOC nurse skin care recommendations. Keep Foley tube in place for now to maintain skin

## 2013-09-11 NOTE — Progress Notes (Signed)
[facility] Admission Coordinator Signed Physical Medicine and Rehabilitation PMR Pre-admission Service date: 09/07/2013 3:17 PM  Related encounter: Documentation from 09/07/2013 in [place] [facility] Admission Coordinator Pre-Admission Assessment   Patient: Elijah [name] is an 80 y.o., male MRN: [id number] DOB: [birth year] Height: 6' 2"  (188 cm) Weight: 93.895 kg (207 lb)   Insurance Information HMO: yes    PPO:      PCP:      IPA:      80/20:      OTHER:   PRIMARY: [employer] Medicare Complete Plan 2      Policy#: [id number]      Subscriber: self CM Name: Anette Guarneri      Phone#: [phone], ext [phone]     Fax#: [phone] Approval given on 09-06-13 for seven days. However, pt was not admitted to inpatient rehab until 09-11-13. Updates due on 09-14-13. Pre-Cert#: [id number]      Employer: retired           Benefits:  Phone #: [phone]     Name: Blenda Bridegroom. Date: 03-22-13     Deduct: none      Out of Pocket Max: [phone] (met $66.80)      Life Max:  none CIR: $430/day for days 1-4; pre-auth needed      SNF: $0/day for days 1-20; $155/day for days 21-64; $0/day for days 65-100 (100 day visit max; pre-auth needed) Outpatient: covered     Co-Pay: $40 copay, no visit limits, based on medical necessity Home Health: covered      Co-Pay: none, no visit limits, based on medical necessity DME: 80%     Co-Pay: 20% Providers: in network   Emergency Contact Information Contact Information     Name  Relation  Home  Work  [place]  Son  [phone]    [place]  [phone]             Current Medical History  Patient Admitting Diagnosis: bilateral CVAs   History of Present Illness: This 80 year old patient with previous history of diabetes, had sudden onset of shaking uncontrollably with slurred speech while he was putting some laminate floor on his house. His blood sugar  was checked and it was in the 130's. EMS was called. When they arrived, they noticed him to be in irregular heart rhythm. When the patient arrived in ED at [facility], he was noticed to have frequent PVCs and junctional rhythm with some tachycardia. He denied any chest pain or shortness of breath. Initial MRI brain revealed a very small right frontal infarct on 08-28-13. Pt had confusion and moderate dysarthia with less responsiveness overall. Work up also revealed gram positive cocci (sepsis with methicillin-sensitive staph aureus bacteremia) and pt was switched over to Ancef per infectious disease. Per infectious disease, he will need a total of 4 weeks of antibiotics from date of 6/10 onward. Since admission at [facility], pt was transferred to and from CCU secondary to worsening respiratory status due to aspiration, now improved and on 2L supplemental O2. Pt had a failed attempt at TEE and had bleeding from palate area due to laceration, needing cautery from ENT. Pt also with severe hypernatremia (as high as 160) and sodium has been trending down, now 147 as of 09-10-13 with IVF and on D5W. Repeat  MRI brain on 6-19 demonstrated multiple small areas of acute infarct in bilateral cerebral white matter, measuring less than 1 cm each. Pt has had varying levels of alertness and was started on Provigil. Acute inpatient rehab was recommended by therapy and pt and supportive family are agreeable to pursue inpatient rehab to maximize pt's functional recovery.   Patient's medical record from [facility] has been reviewed by the rehabilitation admission coordinator and physician.   Past Medical History  Diabetes mellitus II, hypertension and hyperlipidemia   Family History  family history is not on file.   Prior Rehab/Hospitalizations: none               Current Medications See MAR   Patients Current Diet:  Had modified barium swallow study on 09-10-13. Current diet  recommendations are for: dys 1, nectar thick   Precautions / Restrictions Precautions Precautions: Fall (Aspiration precautions)    Prior Activity Level Community (5-7x/wk): pt was very active prior to CVA, driving, enjoys his garden and managing small corn crop. Pt was also tiling his floors.   Home Assistive Devices / Equipment Home Assistive Devices/Equipment: None      Prior Functional Level  Current Functional Level   Bed Mobility   Independent   Max assist (max assist with rolling, max to dependent assist with supine)    Transfers   Independent   Max assist;Total assist (max to total assist of 2 for squat pivot transfer to chair)    Mobility - Walk/Wheelchair   Independent   Other (not assessed at this time.)    Upper Body Dressing   Independent   Other (not assessed)    Lower Body Dressing   Independent   Other (not assessed)    Grooming   Independent   Not assessed   Eating/Drinking   Independent   Needs assist from family, not assessed   Toilet Transfer   Independent   Other (not assessed)    Bladder Continence     WFL   incontinent   Bowel Management   WFL   Last BM on 09-09-13   Stair Climbing   Independent   Other (not assessed)    Communication   WFL   dysarthric    Memory   WFL   unable to clearly assess    Cooking/Meal Prep   Independent        Housework   Independent      Money Management   Independent      Driving   Independent         Special needs/care consideration BiPAP/CPAP no   CPM no   Continuous Drip IV no   Dialysis no          Life Vest no   Oxygen - currently on 2 L O2 Special Bed no   Trach Size no   Wound Vac (area) no        Skin - right ear with skin irration, both ears red                               Bowel mgmt: incontinent Bladder mgmt: last BM on 09-09-13 Diabetic mgmt - managed with meds at home   Previous Home Environment Living Arrangements: Spouse/significant other  Lives With:  Spouse Available Help at Discharge: Family;Available 24 hours/day (wife can provide 24-7 supervision, 2 sons and daughter-in-law can provide physical assistance, son does work and other  son volunteers for [employer].) Type of Home: House Home Layout: One level Home Access: Stairs to enter Entrance Stairs-Rails: Psychiatric nurse of Steps: 3   Discharge Living Setting Plans for Discharge Living Setting: Patient's home Type of Home at Discharge: House Discharge Home Layout: One level Discharge Home Access: Stairs to enter Entrance Stairs-Rails: Right;Left Entrance Stairs-Number of Steps: 3 Does the patient have any problems obtaining your medications?: No   Social/Family/Support Systems Patient Roles: Spouse Contact Information: wife Vaughan Basta and son Alvester Chou are primary contacts Anticipated Caregiver: wife and 2 sons Alvester Chou) (wife will be limited in amount of physical assist she can provide) Anticipated Caregiver's Contact Information: see above Ability/Limitations of Caregiver: wife can provide supervision and poss. light assistance. 2 sons and daughter-in-law can provide physical assist. Caregiver Availability: 24/7 Discharge Plan Discussed with Primary Caregiver: Yes (discussed with wife and son in person and by phone 6-18 & 6-19. Left voicemails with pt's son on 6-22. Spoke with pt's son on 09-11-13.) Is Caregiver In Agreement with Plan?: Yes Does Caregiver/Family have Issues with Lodging/Transportation while Pt is in Rehab?: No   Goals/Additional Needs Patient/Family Goal for Rehab: Minimal to moderate assistance with PT/OT and minimal assistance with SLP Expected length of stay: 14-18 days Cultural Considerations: none Dietary Needs: dys 1, nectar thick, high aspiration risk Equipment Needs: to be determined Pt/Family Agrees to Admission and willing to participate: Yes Program Orientation Provided & Reviewed with Pt/Caregiver Including Roles  & Responsibilities:  Yes   Note: pt may possibly need further skilled nursing care at end of rehab stay, depending on pt's overall progress with activity and family's ability to care for pt. Dr. Naaman Plummer is aware of this possibility and this discussion was made with pt's son and wife.   Patient Condition: I met with pt and his wife at [facility] on 6-18 to explain the possibility of inpatient rehabilitation. This 80 year old patient was previously independent in mobility and in basic ADLs prior to his recent bilateral cerebral strokes. Pt is currently requiring maximal assistance of 2 to sit on edge of bed and pt has balance issues maintaining midline, needing moderate to contact guard assistance to keep sitting balance. He needed max to total assist of 2 for sit pivot transfer to chair. His ADL functions are quite limited by his decreased activity tolerance and were not able to be determined during OT eval. In addition, pt has had skilled speech therapy to address his dysphagia needs (was on a dysphagia 1 with nectar thick liquids). He is on strict aspiration precautions and will benefit from further skilled SLP services to progress his diet following his barium swallow study and to determine cognitive issues following his CVA. He will benefit greatly from the multi-disciplinary team of skilled PT, OT, SLP and rehab nursing to maximize his functional return following this new CVA. PT, OT and rehab nursing can focus on increasing strength for greater independence in bed mobility, transfers and pre-gait skills. Rehab nursing can focus on pt/family teaching with medication needs and bowel and bladder care as well as monitoring with skin issues. In addition, pt will benefit from further rehab physician intervention to monitor his antibiotic needs in light of sepsis and further anti-coagulation needs. Discussed case with Dr. Naaman Plummer who stated that pt is a good candidate for inpatient rehab and received medical  clearance from Dr. Manuella Ghazi at [facility] on 09-11-13. Pt and his family are motivated to come to inpatient rehab and will benefit from the  intensive services of skilled therapy under rehab physician guidance. Pt will be admitted today on 09-11-13.   Preadmission Screen Completed By:  Nanetta Batty, PT 09/11/2013 0922 am ______________________________________________________________________    Discussed status with Dr. Naaman Plummer on 09-11-13 at 0922am and received telephone approval for admission today.   Admission Coordinator:  Nanetta Batty, PT time 0922/Date 09-11-13    Assessment/Plan: Diagnosis: bilateral CVA's Does the need for close, 24 hr/day  Medical supervision in concert with the patient's rehab needs make it unreasonable for this patient to be served in a less intensive setting? Yes Co-Morbidities requiring supervision/potential complications: dm, htn, sepsis Due to bladder management, bowel management, safety, skin/wound care, disease management, medication administration, pain management and patient education, does the patient require 24 hr/day rehab nursing? Yes Does the patient require coordinated care of a physician, rehab nurse, PT (1-2 hrs/day, 5 days/week), OT (1-2 hrs/day, 5 days/week) and SLP (1-2 hrs/day, 5 days/week) to address physical and functional deficits in the context of the above medical diagnosis(es)? Yes Addressing deficits in the following areas: balance, endurance, locomotion, strength, transferring, bowel/bladder control, bathing, dressing, feeding, grooming, toileting, cognition, speech, language, swallowing and psychosocial support Can the patient actively participate in an intensive therapy program of at least 3 hrs of therapy 5 days a week? Yes The potential for patient to make measurable gains while on inpatient rehab is excellent Anticipated functional outcomes upon discharge from inpatients are: min assist and mod assist PT, min assist and mod assist OT, min  assist and mod assist SLP Estimated rehab length of stay to reach the above functional goals is: 14-18 Does the patient have adequate social supports to accommodate these discharge functional goals? Yes Anticipated D/C setting: Home Anticipated post D/C treatments: HH therapy and Outpatient therapy Overall Rehab/Functional Prognosis: excellent       RECOMMENDATIONS: This patient's condition is appropriate for continued rehabilitative care in the following setting: CIR Patient has agreed to participate in recommended program. Yes Note that insurance prior authorization may be required for reimbursement for recommended care.   Comment:  Admitting to CIR today.   Meredith Staggers, MD, [place], [place] 09/11/2013  Revision History...     Date/Time User Action   09/11/2013 9:38 AM Meredith Staggers, MD Sign   09/11/2013 9:34 AM [place] Details Report

## 2013-09-11 NOTE — H&P (Addendum)
Physical Medicine and Rehabilitation Admission H&P  CC: weakness, confusion :  HPI: 80 year old right-handed Caucasian male with history of diabetes mellitus with peripheral neuropathy. Patient independent and active prior to admission. Ad[facility] 08[facility]27/2013 with slurred speech as well as diffuse weakness. His blood sugar was in the 130s. EKG showed frequent PVCs and junctional rhythm as well as tachycardia with nonspecific ST-T wave changes. Troponin level 0.02. CT of the head negative for acute changes. MRI of the brain showed multiple areas of acute infarct in the cerebral white matter bilaterally. Echocardiogram with ejection fraction of 60% without thrombi. Chest x-ray negative. Carotid Dopplers with no significant ICA stenosis. Initially placed on aspirin for CVA prophylaxis changed to eliquis secondary to runs of atrial fibrillation. An attempted TEE was aborted due to a lot of bleeding in the mouth due to the probe that needed cautery by ENT Dr. Andee PolesVaught. The Elliquis was was held for one day and later resumed without any further bleeding episodes. Hospital course hypernatremia 160 maintained on D5W IV fluids and his sodium slowly improved 139. Blood cultures methicillin sensitive staph aureus placed on Unasyn per infectious disease x4 weeks from date of 08/29/2013. Noted dysphagia modified barium swallow dysphagia 1 nectar liquids. Physical and occupational therapy ongoing patient max total assist. Patient is admitted for comprehensive rehabilitation program    Review of Systems  Gastrointestinal: Positive for constipation.  Musculoskeletal: Positive for myalgias.  Neurological: Positive for weakness.  All other systems reviewed and are negative.   PMH: Diabetes mellitus, hypertension, hyperlipidemia  PSH: None  Family history: Diabetes mellitus and hypertension  Social History: Patient smokes an occasional cigar denies alcohol. Lives with family and  assistance as needed  Allergies: None  PCP: Dr. Dewaine Oatsenny Tate  Medications prior to admission.: Metformin 500 mg 2 tablets twice a day.Pioglitazone 30 mg daily  Home:  married lives with wife one level home 3 steps to entry  Functional History:  independent prior to admission and active  Functional Status:  Mobility:  max total assist for mobility. Max total assist activities of daily living     ADL:  max total assist  Cognition:  slurred speech   Physical Exam:  Physical Exam  Constitutional: He appears well-developed. In no acute distress HENT: oral mucosa dry,pink Head: Normocephalic.  Eyes: EOM are normal.  Neck: Normal range of motion. Neck supple. No thyromegaly present.  Cardiovascular:  Cardiac rate controlled,. No murmurs or rubs, Respiratory: Effort normal and breath sounds normal. No respiratory distress. Mouth breathes (sometimes sounds labored). No wheezes or rhonchi GI: Soft. Bowel sounds are normal. He exhibits no distension. Non-tender Neurological: He appears intermittently alert. [facility] [place]. He told me where he lived and that he was here for a CVA.  Sometimes falls asleep during exam.  Speech is dysarthric but intelligible. Decreased oro-motor control. ?left HH and inattention to the left.  Moves right side more spontaneously then the left. RUE grossly 3+ to 4/5. LUE and LLE 2+ to 3/5. Sense pain in all 4's but right more sensitive than left.  Follows simple commands.  Skin: Skin is warm and dry. Excoriated area about sacrum/buttocks Psych: generally pleasant  Vitals: 130/58, temperature 98.4, pulse 100, respirations 18, oxygen saturation is 96% room air  No results found for this or any previous visit (from the past 48 hour(s)).  No results found.   Medical Problem List and Plan:  1. Functional deficits secondary to  acute cerebral white matter bilateral infarct  2. DVT Prophylaxis/Anticoagulation: Eliquis. Monitor for any  bleeding episodes  3. Pain Management: Tylenol as needed  4. Dysphagia with decreased nutritional storage. Dysphagia 1 nectar liquids. Followup speech therapy monitor hydration. Consider appetite stimulant  5. Neuropsych: This patient is not capable of making decisions on his own behalf.  6. Hypertension/atrial fibrillation. Norvasc 10 mg daily, hydralazine 10 mg 4 times a day, metoprolol 25 mg every 8 hours. Monitor with increased mobility  7 ID/methicillin sensitive staph aureus. Continue cefazolin 2 g intravenously every 8 hours x4 weeks total initiated 08/29/2013. Monitor for any fever  8. Diabetes mellitus with peripheral neuropathy. Check CBGs a.c. and at bedtime. Levemir 20 units each bedtime.  9. Integumentary: local skin care, turning, maintain foley for now   Post Admission Physician Evaluation:  1. Functional deficits secondary to embolic bi-cerebral infarcts. 2. Patient is admitted to receive collaborative, interdisciplinary care between the physiatrist, rehab nursing staff, and therapy team. 3. Patient's level of medical complexity and substantial therapy needs in context of that medical necessity cannot be provided at a lesser intensity of care such as a SNF. 4. Patient has experienced substantial functional loss from his/her baseline which was documented above under the "Functional History" and "Functional Status" headings. Judging by the patient's diagnosis, physical exam, and functional history, the patient has potential for functional progress which will result in measurable gains while on inpatient rehab. These gains will be of substantial and practical use upon discharge in facilitating mobility and self-care at the household level. 5. Physiatrist will provide 24 hour management of medical needs as well as oversight of the therapy plan/treatment and provide guidance as appropriate regarding the interaction of the two. 6. 24 hour rehab nursing will assist with bladder management,  bowel management, safety, skin/wound care, disease management, medication administration, pain management and patient education and help integrate therapy concepts, techniques,education, etc. 7. PT will assess and treat for/with: Lower extremity strength, range of motion, stamina, balance, functional mobility, safety, adaptive techniques and equipment, NMR, cognitive perceptual and visual perceptual awareness, stroke education. Goals are: supervision to min assist. 8. OT will assess and treat for/with: ADL's, functional mobility, safety, upper extremity strength, adaptive techniques and equipment, NMR, visual and cognitive perceptual awareness, family/caregiver ed. Goals are: supervision to min assist. 9. SLP will assess and treat for/with: cognition, communication, swallowing, caregiver ed. Goals are: min assist. 10. Case Management and Social Worker will assess and treat for psychological issues and discharge planning. 11. Team conference will be held weekly to assess progress toward goals and to determine barriers to discharge. 12. Patient will receive at least 3 hours of therapy per day at least 5 days per week. 13. ELOS: 20-25 days  14. Prognosis: good  Ranelle OysterZachary T. Swartz, MD, [facility] [place] [facility]   09/11/2013

## 2013-09-12 ENCOUNTER — Inpatient Hospital Stay (HOSPITAL_COMMUNITY): Payer: Medicare Other

## 2013-09-12 ENCOUNTER — Inpatient Hospital Stay (HOSPITAL_COMMUNITY): Payer: Medicare Other | Admitting: Occupational Therapy

## 2013-09-12 DIAGNOSIS — I6340 Cerebral infarction due to embolism of unspecified cerebral artery: Secondary | ICD-10-CM

## 2013-09-12 DIAGNOSIS — I69921 Dysphasia following unspecified cerebrovascular disease: Secondary | ICD-10-CM

## 2013-09-12 LAB — COMPREHENSIVE METABOLIC PANEL
ALT: 109 U/L — ABNORMAL HIGH (ref 0–53)
AST: 142 U/L — ABNORMAL HIGH (ref 0–37)
Albumin: 1.8 g/dL — ABNORMAL LOW (ref 3.5–5.2)
Alkaline Phosphatase: 1091 U/L — ABNORMAL HIGH (ref 39–117)
BUN: 22 mg/dL (ref 6–23)
CO2: 23 mEq/L (ref 19–32)
Calcium: 8.8 mg/dL (ref 8.4–10.5)
Chloride: 109 mEq/L (ref 96–112)
Creatinine, Ser: 0.99 mg/dL (ref 0.50–1.35)
GFR calc Af Amer: 89 mL/min — ABNORMAL LOW (ref >90)
GFR calc non Af Amer: 77 mL/min — ABNORMAL LOW (ref >90)
Glucose, Bld: 207 mg/dL — ABNORMAL HIGH (ref 70–99)
Potassium: 4.2 mEq/L (ref 3.7–5.3)
Sodium: 144 mEq/L (ref 137–147)
Total Bilirubin: 0.7 mg/dL (ref 0.3–1.2)
Total Protein: 7.2 g/dL (ref 6.0–8.3)

## 2013-09-12 LAB — CBC WITH DIFFERENTIAL/PLATELET
Basophils Absolute: 0 10*3/uL (ref 0.0–0.1)
Basophils Relative: 0 % (ref 0–1)
Eosinophils Absolute: 0.2 10*3/uL (ref 0.0–0.7)
Eosinophils Relative: 2 % (ref 0–5)
HCT: 29.3 % — ABNORMAL LOW (ref 39.0–52.0)
Hemoglobin: 9.5 g/dL — ABNORMAL LOW (ref 13.0–17.0)
Lymphocytes Relative: 23 % (ref 12–46)
Lymphs Abs: 2.8 10*3/uL (ref 0.7–4.0)
MCH: 30.8 pg (ref 26.0–34.0)
MCHC: 32.4 g/dL (ref 30.0–36.0)
MCV: 95.1 fL (ref 78.0–100.0)
Monocytes Absolute: 0.5 10*3/uL (ref 0.1–1.0)
Monocytes Relative: 4 % (ref 3–12)
Neutro Abs: 8.7 10*3/uL — ABNORMAL HIGH (ref 1.7–7.7)
Neutrophils Relative %: 71 % (ref 43–77)
Platelets: 221 10*3/uL (ref 150–400)
RBC: 3.08 MIL/uL — ABNORMAL LOW (ref 4.22–5.81)
RDW: 15.5 % (ref 11.5–15.5)
WBC: 12.2 10*3/uL — ABNORMAL HIGH (ref 4.0–10.5)

## 2013-09-12 LAB — GLUCOSE, CAPILLARY
Glucose-Capillary: 202 mg/dL — ABNORMAL HIGH (ref 70–99)
Glucose-Capillary: 142 mg/dL — ABNORMAL HIGH (ref 70–99)
Glucose-Capillary: 164 mg/dL — ABNORMAL HIGH (ref 70–99)
Glucose-Capillary: 358 mg/dL — ABNORMAL HIGH (ref 70–99)
Glucose-Capillary: 298 mg/dL — ABNORMAL HIGH (ref 70–99)
Glucose-Capillary: 336 mg/dL — ABNORMAL HIGH (ref 70–99)

## 2013-09-12 LAB — AMYLASE: Amylase: 65 U/L (ref 0–105)

## 2013-09-12 LAB — LIPASE, BLOOD: Lipase: 126 U/L — ABNORMAL HIGH (ref 11–59)

## 2013-09-12 LAB — GAMMA GT: GGT: 702 U/L — ABNORMAL HIGH (ref 7–51)

## 2013-09-12 MED ORDER — ENSURE PUDDING PO PUDG
1.0000 | Freq: Three times a day (TID) | ORAL | Status: DC
  Administered 2013-09-12 – 2013-10-09 (×73): 1 via ORAL

## 2013-09-12 MED ORDER — IOHEXOL 300 MG/ML  SOLN: 25.0000 mL | INTRAMUSCULAR | Status: AC

## 2013-09-12 MED ORDER — BISACODYL 10 MG RE SUPP
10.0000 mg | Freq: Every day | RECTAL | Status: DC | PRN
  Administered 2013-09-12 – 2013-09-21 (×2): 10 mg via RECTAL
  Filled 2013-09-12: qty 1

## 2013-09-12 MED ORDER — CHLORHEXIDINE GLUCONATE 0.12 % MT SOLN
15.0000 mL | Freq: Two times a day (BID) | OROMUCOSAL | Status: DC
  Administered 2013-09-13 – 2013-10-09 (×43): 15 mL via OROMUCOSAL
  Filled 2013-09-12 (×58): qty 15

## 2013-09-12 MED ORDER — BIOTENE DRY MOUTH MT LIQD
15.0000 mL | Freq: Two times a day (BID) | OROMUCOSAL | Status: DC
  Administered 2013-09-12 – 2013-10-10 (×42): 15 mL via OROMUCOSAL

## 2013-09-12 MED ORDER — IOHEXOL 300 MG/ML  SOLN: 100.0000 mL | Freq: Once | INTRAMUSCULAR | Status: AC | PRN

## 2013-09-12 NOTE — Evaluation (Signed)
Speech Language Pathology Assessment and Plan  Patient Details  Name: Elijah [name] MRN: [id number] Date of Birth: [birth year]  SLP Diagnosis: Dysarthria;Cognitive Impairments;Dysphagia  Rehab Potential: Good ELOS: 25-28 days   Today's Date: 09/12/2013 Time: 1320 (30 min co-tx with PT (CC))-1400 Time Calculation (min): 40 min  Problem List:  Patient Active Problem List   Diagnosis Date Noted  . CVA (cerebral infarction) 09/11/2013   Past Medical History: No past medical history on file. Past Surgical History: No past surgical history on file.  Assessment / Plan / Recommendation Clinical Impression Pt 80 year old right-handed Caucasian male with h/o diabetes mellitus with peripheral neuropathy. Pt independent and active PTA. Admitted to [facility] 08/27/2013 with slurred speech and diffuse weakness. His blood sugar was in the 130s. EKG showed frequent PVCs and junctional rhythm as well as tachycardia with nonspecific ST-T wave changes. Troponin level 0.02. CT of the head negative for acute changes. MRI of the brain showed multiple areas of acute infarct in the cerebral white matter bilaterally. Echocardiogram with ejection fraction of 60% without thrombi. CXR negative. Carotid Dopplers with no significant ICA stenosis. Initially placed on aspirin for CVA prophylaxis changed to Eliquis secondary to runs of atrial fibrillation. An attempted TEE was aborted due to a lot of bleeding in the mouth due to the probe that needed cautery by ENT Dr. Pryor Ochoa. The Elliquis was held for one day and later resumed without any further bleeding episodes. Hospital course hypernatremia 160 maintained on D5W IV fluids and his sodium slowly improved 139. Blood cultures methicillin sensitive staph aureus placed on Unasyn per infectious disease x4 weeks from date of 08/29/2013. Noted dysphagia MBS dysphagia 1 nectar liquids. PT/OT ongoing, pt Max-Total assist. Pt is admitted for comprehensive  rehabilitation program. SLP bedside-swallow and cognitive-linguistic evaluations were administered. Recommend to continue current diet textures (Dys 1, nectar thick liquids) given signs of oral and possible pharyngeal dysphagia. Pt is disoriented to time with impaired sustained attention and basic problem solving, storage of new information, and ability to follow basic commands. His speech is also noted to be moderately dysarthric and characterized by imprecise articulation and reduced breath support. Pt will benefit from skilled SLP services to maximize swallowing safety, cognitive function, and functional independence prior to discharge.   Skilled Therapeutic Interventions          Cognitive-linguistic and bedside-swallow evaluations completed with results and recommendations reviewed with patient.     SLP Assessment  Patient will need skilled Speech Lanaguage Pathology Services during CIR admission    Recommendations  Diet Recommendations: Dysphagia 1 (Puree);Nectar-thick liquid Liquid Administration via: Cup Medication Administration: Crushed with puree Supervision: Patient able to self feed;Full supervision/cueing for compensatory strategies Compensations: Slow rate;Small sips/bites;Check for pocketing Postural Changes and/or Swallow Maneuvers: Seated upright 90 degrees;Upright 30-60 min after meal Oral Care Recommendations: Oral care BID;Other (Comment) (after meals PRN) Patient destination: Home Follow up Recommendations: Home Health SLP;24 hour supervision/assistance Equipment Recommended: None recommended by SLP    SLP Frequency 5 out of 7 days   SLP Treatment/Interventions Cognitive remediation/compensation;Cueing hierarchy;Dysphagia/aspiration precaution training;Environmental controls;Functional tasks;Internal/external aids;Oral motor exercises;Patient/family education;Speech/Language facilitation    Pain Pain Assessment Pain Assessment: No/denies pain Prior  Functioning Cognitive/Linguistic Baseline: Information not available Type of Home: House  Lives With: Spouse Available Help at Discharge: Family;Available 24 hours/day Vocation: Retired  Industrial/product designer Term Goals: Week 1: SLP Short Term Goal 1 (Week 1): Pt will utilize recommended strategies with recommended diet with Mod cues SLP Short Term Goal 2 (Week  1): Pt will utilize speech intelligibility strategies at the phrase level with Mod cues SLP Short Term Goal 3 (Week 1): Pt will sustain attention to basic familiar task for 5 minutes with Mod cues SLP Short Term Goal 4 (Week 1): Pt will verbalize at least 1cognitive and 1 physical impairment with Mod cues SLP Short Term Goal 5 (Week 1): Pt will demonstrate basic problem solving during basic, familiar task with Mod cues SLP Short Term Goal 6 (Week 1): Pt will utilize external memory aids to recall new and daily information with Max cues  See FIM for current functional status Refer to Care Plan for Long Term Goals  Recommendations for other services: None  Discharge Criteria: Patient will be discharged from SLP if patient refuses treatment 3 consecutive times without medical reason, if treatment goals not met, if there is a change in medical status, if patient makes no progress towards goals or if patient is discharged from hospital.  The above assessment, treatment plan, treatment alternatives and goals were discussed and mutually agreed upon: No family available/patient unable   Germain Osgood, M.A. CCC-SLP [phone]  Germain Osgood 09/12/2013, 5:40 PM

## 2013-09-12 NOTE — Evaluation (Signed)
Occupational Therapy Assessment and Plan  Patient Details  Name: Elijah [name] MRN: [id number] Date of Birth: 1933/04/12  OT Diagnosis: abnormal posture, apraxia, cognitive deficits, disturbance of vision, hemiplegia affecting non-dominant side and muscle weakness (generalized) Rehab Potential: Rehab Potential: Good ELOS: 3-4 weeks   Today's Date: 09/12/2013 Time: 0800-0900 Time Calculation (min): 60 min  Problem List:  Patient Active Problem List   Diagnosis Date Noted  . CVA (cerebral infarction) 09/11/2013    Past Medical History: No past medical history on file. Past Surgical History: No past surgical history on file.  Assessment & Plan Clinical Impression:  80 year old right-handed Caucasian male with history of diabetes mellitus with peripheral neuropathy. Patient independent and active prior to admission. Admitted to [facility] 08/27/2013 with slurred speech as well as diffuse weakness. His blood sugar was in the 130s. EKG showed frequent PVCs and junctional rhythm as well as tachycardia with nonspecific ST-T wave changes. Troponin level 0.02. CT of the head negative for acute changes. MRI of the brain showed multiple areas of acute infarct in the cerebral white matter bilaterally. Echocardiogram with ejection fraction of 60% without thrombi. Chest x-ray negative. Carotid Dopplers with no significant ICA stenosis. Initially placed on aspirin for CVA prophylaxis changed to eliquis secondary to runs of atrial fibrillation. An attempted TEE was aborted due to a lot of bleeding in the mouth due to the probe that needed cautery by ENT Dr. Pryor Ochoa. The Elliquis was was held for one day and later resumed without any further bleeding episodes. Hospital course hypernatremia 160 maintained on D5W IV fluids and his sodium slowly improved 139. Blood cultures methicillin sensitive staph aureus placed on Unasyn per infectious disease x4 weeks from date of 08/29/2013. Noted  dysphagia modified barium swallow dysphagia 1 nectar liquids. Physical and occupational therapy ongoing patient max total assist. Patient is admitted for comprehensive rehabilitation program  Patient transferred to CIR on 09/11/2013 .    Patient currently requires total with basic self-care skills secondary to muscle weakness, decreased cardiorespiratoy endurance, abnormal tone, unbalanced muscle activation and motor apraxia, decreased visual perceptual skills, decreased visual motor skills and field cut, decreased attention to right, decreased initiation, decreased attention, decreased awareness, decreased problem solving, decreased safety awareness, decreased memory and delayed processing and decreased sitting balance, decreased standing balance, decreased postural control and hemiplegia.  Prior to hospitalization, patient was fully independent and very active working on home repairs.   Patient will benefit from skilled intervention to increase independence with basic self-care skills prior to discharge home with care partner.  Anticipate patient will require minimal physical assistance and follow up home health.  OT - End of Session Endurance Deficit: Yes OT Assessment Rehab Potential: Good OT Patient demonstrates impairments in the following area(s): Balance;Behavior;Cognition;Endurance;Motor;Safety;Perception;Vision OT Basic ADL's Functional Problem(s): Eating;Grooming;Bathing;Dressing;Toileting OT Transfers Functional Problem(s): Toilet;Tub/Shower OT Additional Impairment(s): Fuctional Use of Upper Extremity OT Plan OT Intensity: Minimum of 1-2 x/day, 45 to 90 minutes OT Frequency: 5 out of 7 days OT Duration/Estimated Length of Stay: 3-4 weeks OT Treatment/Interventions: Balance/vestibular training;Cognitive remediation/compensation;Discharge planning;DME/adaptive equipment instruction;Functional mobility training;Neuromuscular re-education;Patient/family education;Self Care/advanced ADL  retraining;Therapeutic Activities;Therapeutic Exercise;UE/LE Strength taining/ROM;Visual/perceptual remediation/compensation;UE/LE Coordination activities OT Self Feeding Anticipated Outcome(s): supervision/ set up OT Basic Self-Care Anticipated Outcome(s): supervision for grooming and UB dressing, min bathing and LB dressing OT Toileting Anticipated Outcome(s): min A OT Bathroom Transfers Anticipated Outcome(s): min A OT Recommendation Patient destination: Home Follow Up Recommendations: Home health OT Equipment Recommended: 3 in 1 bedside comode;Tub/shower bench  Skilled Therapeutic Intervention Pain: no c/o pain. Tx: Pt seen for initial evaluation and ADL retraining at a limited level. Pt was having a great deal of bowel incontinence during the session so much of the session involved bed mobility and clean up. Pt with significant skin issues, nursing will have wound care evaluate pt.  Pt was not able to follow commands well, overall total A today. Nursing with pt at end of the session.  OT Evaluation Precautions/Restrictions  Precautions Precautions: Fall Restrictions Weight Bearing Restrictions: No General Amount of Missed OT Time (min): 30 Minutes - Pt out for CT scan.      Home Living/Prior Functioning Home Living Available Help at Discharge: Family;Available 24 hours/day Type of Home: House Home Access: Stairs to enter [employer] of Steps: 3 Entrance Stairs-Rails: Right;Left Home Layout: One level  Lives With: Spouse Prior Function Level of Independence: Independent with basic ADLs;Independent with gait;Independent with transfers Comments: Family not present for full interview ADL ADL ADL Comments: Total A Vision/Perception  Vision- History Patient Visual Report: Other (comment) (Pt unable to report) Vision- Assessment Vision Assessment?: Yes Eye Alignment: Impaired (comment) Ocular Range of Motion: Restricted on the right;Restricted looking  up Alignment/Gaze Preference: Gaze left Tracking/Visual Pursuits: Unable to hold eye position out of midline;Requires cues, head turns, or add eye shifts to track Saccades: Impaired - to be further tested in functional context Convergence: Impaired (comment) Visual Fields: Right visual field deficit Diplopia Assessment: Other (comment) (Pt states he does not have double vision, but frequently blinks his eyes.) Depth Perception: Undershoots  Cognition Overall Cognitive Status: Impaired/Different from baseline Orientation Level: Oriented to person;Oriented to place;Disoriented to time;Oriented to situation Attention: Sustained Sustained Attention: Impaired Sustained Attention Impairment: Functional basic Memory: Impaired Memory Impairment: Storage deficit;Decreased recall of new information Awareness: Impaired Awareness Impairment: Intellectual impairment Problem Solving: Impaired Problem Solving Impairment: Functional basic Behaviors: Poor frustration tolerance Safety/Judgment: Impaired Sensation Sensation Light Touch: Appears Intact Stereognosis: Not tested Hot/Cold: Not tested Proprioception: Impaired by gross assessment Coordination Gross Motor Movements are Fluid and Coordinated: No Fine Motor Movements are Fluid and Coordinated: No Coordination and Movement Description: limb apraxia with functional movements, not able to follow directions for coordination testing Motor  Motor Motor: Motor apraxia Mobility    Total assist  Trunk/Postural Assessment    Not evaluated as pt was not able to get out of bed due to bowel incontinence Balance   Not evaluated as pt was not able to get out of bed due to bowel incontinence Extremity/Trunk Assessment RUE Assessment RUE Assessment: Exceptions to [facility] RUE Strength RUE Overall Strength Comments: 3+ to 4-/5 LUE Assessment LUE Assessment: Exceptions to [facility] LUE Strength LUE Overall Strength Comments: 3+ to 4-/5  FIM:  FIM -  Grooming Grooming Steps: Wash, rinse, dry face Grooming: 1: Patient completes 0 of 4 or 1 of 5 steps, or requires 2 helpers FIM - Bathing Bathing Steps Patient Completed: Chest Bathing: 1: Total-Patient completes 0-2 of 10 parts or less than 25% FIM - Upper Body Dressing/Undressing Upper body dressing/undressing: 0: Wears gown/pajamas-no public clothing FIM - Lower Body Dressing/Undressing Lower body dressing/undressing: 0: Wears Interior and spatial designer FIM - Toileting Toileting: 0: Activity did not occur FIM - Air cabin crew Transfers: 0-Activity did not occur FIM - Camera operator Transfers: 0-Activity did not occur or was simulated   Refer to Care Plan for Long Term Goals  Recommendations for other services: None  Discharge Criteria: Patient will be discharged from OT if patient refuses treatment  3 consecutive times without medical reason, if treatment goals not met, if there is a change in medical status, if patient makes no progress towards goals or if patient is discharged from hospital.  The above assessment, treatment plan, treatment alternatives and goals were discussed and mutually agreed upon: No family available/patient unable  [facility] 09/12/2013, 2:35 PM

## 2013-09-12 NOTE — Plan of Care (Signed)
Problem: RH SKIN INTEGRITY Goal: RH STG SKIN FREE OF INFECTION/BREAKDOWN Pt will remain free of infection and further skin breakdown with max assistance Outcome: Progressing Air mattress overlay; turn q2 hrs by staff; skin care as ordered

## 2013-09-12 NOTE — Plan of Care (Signed)
Problem: RH SKIN INTEGRITY Goal: RH STG MAINTAIN SKIN INTEGRITY WITH ASSISTANCE STG Maintain Skin Integrity With max Assistance.  Outcome: Not Progressing Pt incont of bowel; unable to turn self and provide skin care; staff provides skin care and keeps pt off sacrum and heels off bed

## 2013-09-12 NOTE — Plan of Care (Signed)
Problem: RH SAFETY Goal: RH STG ADHERE TO SAFETY PRECAUTIONS W/ASSISTANCE/DEVICE STG Adhere to Safety Precautions With max Assistance/Device.  Outcome: Not Progressing Pt requires total assist at this time

## 2013-09-12 NOTE — Plan of Care (Signed)
Problem: RH BOWEL ELIMINATION Goal: RH STG MANAGE BOWEL WITH ASSISTANCE STG Manage Bowel with max Assistance.  Outcome: Not Progressing Total assist

## 2013-09-12 NOTE — Progress Notes (Signed)
Since admission pt has continuous loose stools.  Suppository given at 0900 to help pt clear stool. Pt continues to have constant loose stool. Pt sacral area cleaned with barrier cream applied and ota. Pt on overlay mattress, turned to side with call bell within reach.  Notified Harvel Ricksan Anguilli., PA with no new orders at this time.

## 2013-09-12 NOTE — Consult Note (Addendum)
WOC wound consult note Reason for Consult: Consult requested for buttocks and sacral area.   Wound type: Skin red and macerated, moist with patchy areas of partial thickness skin loss across area approx 10X10cm to bilat buttocks and sacrum. Open areas are red and moist with small amt yellow-pink drainage.  No odor.  Red raised satellite lesions distributed across buttocks, scrotum, and lower back.  Appearace consistent with moisture-associated skin damage and candidiasis.    Pressure Ulcer POA: Yes; present on admission but this is NOT a pressure ulcer Dressing procedure/placement/frequency: Pt has been incontinent.  Currently foam dressings are in place.  If they become frequently soiled, then leave open to air and apply barrier cream and antifungal power with each turning or cleansing episode.  Air mattress to reduce pressure and promote airflow to affected areas. Pt has foley to contain urine but is frequently incontinent of stool.    Left heel with red boggy skin but does blanch.  Right heel with 2 areas of deep tissue injury; .1X.1cm and .1X.2cm.  Both present on admission. Float heels to reduce pressure. No other topical treatment indicated at this time.  Please re-consult if further assistance is needed.  Thank-you,  Cammie Mcgeeawn Engels MSN, RN, CWOCN, [place] West[place]CN-AP, CNS [phone]

## 2013-09-12 NOTE — Progress Notes (Signed)
INITIAL NUTRITION ASSESSMENT  Patient meets the criteria for severe MALNUTRITION in the context of acute illness with 4% weight loss in 1 week and PO intake <75% of estimated needs.   DOCUMENTATION CODES Per approved criteria  -Severe malnutrition in the context of acute illness or injury   INTERVENTION: -Ensure Pudding TID, each provides 170 kcal, 4 g protein -Encourage adequate intake of meals with assistance when alert.  -May benefit from appetite stimulant  NUTRITION DIAGNOSIS: Inadequate oral intake related to CVA as evidenced by 10% meal intake.   Goal: Patient will meet >/=90% of estimated nutrition needs  Monitor:  PO intake, weight, labs  Reason for Assessment: Low Braden  80 y.o. male  Admitting Dx: CVA  ASSESSMENT: 80 year old right-handed Caucasian male with history of diabetes mellitus with peripheral neuropathy. Patient independent and active prior to admission. Ad[facility] 08/27/2013 with slurred speech as well as diffuse weakness.  Unable to obtain history from patient due to decreased alertness. Patient with poor PO intake 0-10% of meals. MBS on 6/22 recommended Dysphagia 1-Nectar thick liquids. Patient with excoriated buttocks/sacrum and deep tissue injury to right heel. Consideration of appetite stimulant noted. Patient has lost about 9 pounds in the last week (4% of UBW), meeting the criteria for severe malnutrition in acute illness.   Height: Ht Readings from Last 1 Encounters:  09/11/13 6\' 2"  (1.88 m)    Weight: Wt Readings from Last 1 Encounters:  09/11/13 198 lb 13.7 oz (90.2 kg)    Ideal Body Weight: 190 pounds  % Ideal Body Weight: 108%  Wt Readings from Last 10 Encounters:  09/11/13 198 lb 13.7 oz (90.2 kg)  09/07/13 207 lb (93.895 kg)    Usual Body Weight: 207 pounds 1 week ago  % Usual Body Weight: 96%  BMI:  There is no weight on file to calculate BMI.  Estimated Nutritional Needs: Kcal:  2050-2200 kcal Protein: 110-125 g Fluid: >2.7 L/day  Skin: Excoriated area on buttocks/sacrum, deep tissue injury right heel  Diet Order: Dysphagia 1, nectar thick  EDUCATION NEEDS: -No education needs identified at this time   Intake/Output Summary (Last 24 hours) at 09/12/13 1317 Last data filed at 09/12/13 1254  Gross per 24 hour  Intake    120 ml  Output   2400 ml  Net  -2280 ml    Last BM: 6/24   Labs:   Recent Labs Lab 09/12/13 0120  NA 144  K 4.2  CL 109  CO2 23  BUN 22  CREATININE 0.99  CALCIUM 8.8  GLUCOSE 207*    CBG (last 3)   Recent Labs  09/11/13 2126 09/12/13 0735 09/12/13 1214  GLUCAP 202* 142* 164*    Scheduled Meds: . amLODipine  10 mg Oral Daily  . apixaban  5 mg Oral BID  . atorvastatin  40 mg Oral q1800  .  ceFAZolin (ANCEF) IV  2 g Intravenous 3 times per day  . hydrALAZINE  10 mg Oral 4 times per day  . insulin detemir  20 Units Subcutaneous QHS  . metoprolol tartrate  25 mg Oral 3 times per day    Continuous Infusions:   No past medical history on file.  No past surgical history on file.  Linnell FullingElyse Shearer, RD, LDN Pager #: [phone] After-Hours Pager #: [phone]

## 2013-09-12 NOTE — Progress Notes (Addendum)
Subjective/Complaints: No c/os, had some breakfast this am RN and OT note skin issues on buttocks and scrotum Pt oriented to self and hospital, mild lethargy some problems following commands for MMT No nausea or vomiting  Review of Systems - limited secondary to mental status Objective: Vital Signs: Blood pressure 116/67, pulse 78, temperature 98 F (36.7 C), temperature source Oral, resp. rate 18, SpO2 98.00%. Dg Chest Port 1 View  09/11/2013   CLINICAL DATA:  PICC line placement  EXAM: PORTABLE CHEST - 1 VIEW  COMPARISON:  Portable exam 1821 hr compared to 09/04/2013  FINDINGS: Kyphotic positioning with rotation to the LEFT.  Tip of RIGHT arm PICC line projects over cavoatrial junction.  Normal heart size, mediastinal contours and pulmonary vascularity.  RIGHT basilar atelectasis.  Improved aeration LEFT lower lobe.  Upper lungs clear.  No pleural effusion or pneumothorax.  Bones demineralized.  K-wires at RIGHT acromion and distal RIGHT clavicle.  IMPRESSION: Tip of RIGHT arm PICC line projects over cavoatrial junction.  RIGHT basilar atelectasis persists with improved aeration in LEFT lower lobe.   Electronically Signed   By: Lavonia Dana M.D.   On: 09/11/2013 18:35   Results for orders placed during the hospital encounter of 09/11/13 (from the past 72 hour(s))  GLUCOSE, CAPILLARY     Status: Abnormal   Collection Time    09/11/13  4:30 PM      Result Value Ref Range   Glucose-Capillary 231 (*) 70 - 99 mg/dL  GLUCOSE, CAPILLARY     Status: Abnormal   Collection Time    09/11/13  9:26 PM      Result Value Ref Range   Glucose-Capillary 202 (*) 70 - 99 mg/dL  CBC WITH DIFFERENTIAL     Status: Abnormal   Collection Time    09/12/13  1:20 AM      Result Value Ref Range   WBC 12.2 (*) 4.0 - 10.5 K/uL   RBC 3.08 (*) 4.22 - 5.81 MIL/uL   Hemoglobin 9.5 (*) 13.0 - 17.0 g/dL   HCT 29.3 (*) 39.0 - 52.0 %   MCV 95.1  78.0 - 100.0 fL   MCH 30.8  26.0 - 34.0 pg   MCHC 32.4  30.0 - 36.0 g/dL    RDW 15.5  11.5 - 15.5 %   Platelets 221  150 - 400 K/uL   Neutrophils Relative % 71  43 - 77 %   Neutro Abs 8.7 (*) 1.7 - 7.7 K/uL   Lymphocytes Relative 23  12 - 46 %   Lymphs Abs 2.8  0.7 - 4.0 K/uL   Monocytes Relative 4  3 - 12 %   Monocytes Absolute 0.5  0.1 - 1.0 K/uL   Eosinophils Relative 2  0 - 5 %   Eosinophils Absolute 0.2  0.0 - 0.7 K/uL   Basophils Relative 0  0 - 1 %   Basophils Absolute 0.0  0.0 - 0.1 K/uL  COMPREHENSIVE METABOLIC PANEL     Status: Abnormal   Collection Time    09/12/13  1:20 AM      Result Value Ref Range   Sodium 144  137 - 147 mEq/L   Potassium 4.2  3.7 - 5.3 mEq/L   Chloride 109  96 - 112 mEq/L   CO2 23  19 - 32 mEq/L   Glucose, Bld 207 (*) 70 - 99 mg/dL   BUN 22  6 - 23 mg/dL   Creatinine, Ser 0.99  0.50 - 1.35  mg/dL   Calcium 8.8  8.4 - 10.5 mg/dL   Total Protein 7.2  6.0 - 8.3 g/dL   Albumin 1.8 (*) 3.5 - 5.2 g/dL   AST 142 (*) 0 - 37 U/L   ALT 109 (*) 0 - 53 U/L   Alkaline Phosphatase 1091 (*) 39 - 117 U/L   Total Bilirubin 0.7  0.3 - 1.2 mg/dL   GFR calc non Af Amer 77 (*) >90 mL/min   GFR calc Af Amer 89 (*) >90 mL/min   Comment: (NOTE)     The eGFR has been calculated using the CKD EPI equation.     This calculation has not been validated in all clinical situations.     eGFR's persistently <90 mL/min signify possible Chronic Kidney     Disease.  GLUCOSE, CAPILLARY     Status: Abnormal   Collection Time    09/12/13  7:35 AM      Result Value Ref Range   Glucose-Capillary 142 (*) 70 - 99 mg/dL     HEENT: normal Cardio: irregular and no murmurs Resp: CTA B/L and unlabored GI: BS positive and large bilateral inguinal mass, non reducable mildly tender Extremity:  No Edema Skin:   Breakdown sacral erythema, eschar over L gluteal area, erythematous satellite lesions in peri area Neuro: Confused, Abnormal Sensory cannot assess secondary to mental status, Abnormal Motor 4/5 in BUE, 3/5 in BLE, Abnormal FMC Ataxic/ dec FMC,  Dysarthric and Apraxic Musc/Skel:  Normal Gen NAD   Assessment/Plan: 1. Functional deficits secondary to bilateral cerebral infarct embolic which require 3+ hours per day of interdisciplinary therapy in a comprehensive inpatient rehab setting. Physiatrist is providing close team supervision and 24 hour management of active medical problems listed below. Physiatrist and rehab team continue to assess barriers to discharge/monitor patient progress toward functional and medical goals. Team conference today please see physician documentation under team conference tab, met with team face-to-face to discuss problems,progress, and goals. Formulized individual treatment plan based on medical history, underlying problem and comorbidities. FIM:                                  Medical Problem List and Plan:  1. Functional deficits secondary to acute cerebral white matter bilateral infarct  2. DVT Prophylaxis/Anticoagulation: Eliquis. Monitor for any bleeding episodes  3. Pain Management: Tylenol as needed  4. Dysphagia with decreased nutritional storage. Dysphagia 1 nectar liquids. Followup speech therapy monitor hydration. Consider appetite stimulant  5. Neuropsych: This patient is not capable of making decisions on his own behalf.  6. Hypertension/atrial fibrillation. Norvasc 10 mg daily, hydralazine 10 mg 4 times a day, metoprolol 25 mg every 8 hours. Monitor with increased mobility  7 ID/methicillin sensitive staph aureus. Continue cefazolin 2 g intravenously every 8 hours x4 weeks total initiated 08/29/2013. Monitor for any fever  8. Diabetes mellitus with peripheral neuropathy. Check CBGs a.c. and at bedtime. Levemir 20 units each bedtime.  9. Integumentary: local skin care, turning, maintain foley for now, mattress overlay, WOC, Diflucan for yeast 10.  Inguinal mass, probable hernia, also has elevated LFTs, check CT abd and pelvis, ask Gen Surg to eval  LOS (Days) 1 A FACE  TO FACE EVALUATION WAS PERFORMED  Senna Lape E 09/12/2013, 8:38 AM

## 2013-09-12 NOTE — Progress Notes (Signed)
Patient information reviewed and entered into eRehab system by Marie Noel, RN, CRRN, PPS Coordinator.  Information including medical coding and functional independence measure will be reviewed and updated through discharge.     Per nursing patient was given "Data Collection Information Summary for Patients in Inpatient Rehabilitation Facilities with attached "Privacy Act Statement-Health Care Records" upon admission.  

## 2013-09-12 NOTE — Progress Notes (Signed)
Family came to visit at 1830.  Discussed rehab process and plan of care with family. Reviewed therapies and nursing care. Family very appreciative and thankful for the care thus far. Family pleased with care and feel pt is at the best place possible and is in good hands. I showed them around the unit and answered all questions at his time.  Pt resting in bed with call bell within reach.

## 2013-09-12 NOTE — Evaluation (Signed)
Physical Therapy Assessment and Plan  Patient Details  Name: Elijah [name] MRN: [id number] Date of Birth: [birth year]  PT Diagnosis: Abnormal posture, Abnormality of gait, Cognitive deficits, Hemiparesis non-dominant and Muscle weakness Rehab Potential: Good ELOS: 25-28 days   Today's Date: 09/12/2013 Time: 1320 (30 min co-tx with PT (CC))-1400 Time Calculation (min): 40 min  Problem List:  Patient Active Problem List   Diagnosis Date Noted  . CVA (cerebral infarction) 09/11/2013    Past Medical History: No past medical history on file. Past Surgical History: No past surgical history on file.  Assessment & Plan Clinical Impression:   80 year old right-handed Caucasian male with history of diabetes mellitus with peripheral neuropathy. Patient independent and active prior to admission. Admitted to [facility] 08/27/2013 with slurred speech as well as diffuse weakness. His blood sugar was in the 130s. EKG showed frequent PVCs and junctional rhythm as well as tachycardia with nonspecific ST-T wave changes. Troponin level 0.02. CT of the head negative for acute changes. MRI of the brain showed multiple areas of acute infarct in the cerebral white matter bilaterally. Echocardiogram with ejection fraction of 60% without thrombi. Chest x-ray negative. Carotid Dopplers with no significant ICA stenosis. Initially placed on aspirin for CVA prophylaxis changed to eliquis secondary to runs of atrial fibrillation.  Patient transferred to CIR on 09/11/2013 .   Patient currently requires total +2 assist with mobility secondary to decreased cardiorespiratoy endurance, impaired timing and sequencing, motor apraxia and decreased coordination and decreased initiation, decreased attention, decreased awareness, decreased problem solving, decreased safety awareness and delayed processing.  Prior to hospitalization, patient was independent  with mobility and lived with Spouse in a House home.   Home access is 3Stairs to enter.  Patient will benefit from skilled PT intervention to maximize safe functional mobility, minimize fall risk and decrease caregiver burden for planned discharge home with 24 hour assist.  Anticipate patient will benefit from follow up [facility] at discharge.  PT - End of Session Activity Tolerance: Tolerates < 10 min activity with changes in vital signs Endurance Deficit: Yes Endurance Deficit Description: DYSOE 3/4 upon sitting up EOB PT Assessment Rehab Potential: Good Barriers to Discharge: Decreased caregiver support PT Patient demonstrates impairments in the following area(s): Balance;Behavior;Endurance;Motor;Safety;Perception;Sensory;Skin Integrity PT Transfers Functional Problem(s): Bed Mobility;Bed to Chair;Car;Furniture PT Locomotion Functional Problem(s): Wheelchair Mobility (ambulation and stairs TBD) PT Plan PT Intensity: Minimum of 1-2 x/day ,45 to 90 minutes PT Frequency: 5 out of 7 days PT Duration Estimated Length of Stay: 25-28 days PT Treatment/Interventions: Ambulation/gait training;Balance/vestibular training;Cognitive remediation/compensation;Discharge planning;DME/adaptive equipment instruction;Functional electrical stimulation;Functional mobility training;Patient/family education;Neuromuscular re-education;Splinting/orthotics;Therapeutic Exercise;Therapeutic Activities;Stair training;UE/LE Strength taining/ROM;UE/LE Coordination activities;Visual/perceptual remediation/compensation;Wheelchair propulsion/positioning PT Transfers Anticipated Outcome(s): min assist basic and car PT Locomotion Anticipated Outcome(s): supervision w/c x 150'; ambulation and stairs TBD PT Recommendation Follow Up Recommendations: Home health PT Patient destination: Home Equipment Recommended: Wheelchair (measurements);Wheelchair cushion (measurements) (AD TBD)  Skilled Therapeutic Intervention: Evaluation time in AM missed due to pt going for abdominal CT due to  abdominal swelling.  CT results = inguinal hernias. Therapist attempted to recoup time in PM.  Short session with SLP focusing on bed mobility and transfer OOB to tilt-in-space w/c.  Pt has badly excoriated buttocks and RN highly recommended pt get OOB. Evalution is ongoing due to short session. Locomotion and steps LTGs TBD.  PT Evaluation Precautions/Restrictions Precautions Precautions: Fall Precaution Comments: pt resists movement Restrictions Weight Bearing Restrictions: No General Amount of Missed PT Time (min): 60 Minutes Missed Time Reason:  CT/MRI  Vital SignsTherapy Vitals Temp: 98.4 F (36.9 C) Temp src: Oral Pulse Rate: 90 Resp: 18 BP: 132/78 mmHg Patient Position (if appropriate): Sitting Oxygen Therapy SpO2: 96 % O2 Device: None (Room air) Pain Pain Assessment Pain Assessment: No/denies pain Faces Pain Scale: No hurt Home Living/Prior Functioning Home Living Available Help at Discharge: Family;Available 24 hours/day Type of Home: House Home Access: Stairs to enter [employer] of Steps: 3 Entrance Stairs-Rails: Right;Left Home Layout: One level  Lives With: Spouse Prior Function Level of Independence: Independent with basic ADLs;Independent with gait;Independent with transfers Vocation: Retired Comments: Family not present for full interview Vision/Perception  Vision - Assessment Eye Alignment: Impaired (comment) Tracking/Visual Pursuits: Unable to hold eye position out of midline;Requires cues, head turns, or add eye shifts to track Saccades: Impaired - to be further tested in functional context  Cognition Overall Cognitive Status: Impaired/Different from baseline Arousal/Alertness: Lethargic Orientation Level: Oriented to place;Oriented to situation;Oriented to person;Disoriented to time Attention: Sustained Sustained Attention: Impaired Sustained Attention Impairment: Functional basic;Verbal basic Memory: Impaired Memory Impairment:  Storage deficit;Decreased recall of new information Sensation Sensation Light Touch: Appears Intact (bil LEs) Proprioception: Not tested (TBD) Coordination Gross Motor Movements are Fluid and Coordinated: No Fine Motor Movements are Fluid and Coordinated: No Heel Shin Test: NT Motor  Motor Motor: Motor apraxia Motor - Skilled Clinical Observations: generalized weakness LLE more affected than RLE Mobility Bed Mobility Bed Mobility: Rolling Left;Left Sidelying to Sit Rolling Left: 3: Mod assist Rolling Left Details: Verbal cues for technique;Manual facilitation for weight shifting;Manual facilitation for placement Left Sidelying to Sit: HOB flat;1: +2 Total assist Left Sidelying to Sit: Patient Percentage: 20% Left Sidelying to Sit Details: Manual facilitation for placement;Verbal cues for technique Transfers Transfers: Yes Squat Pivot Transfers: 1: +2 Total assist;From elevated surface Squat Pivot Transfer Details: Manual facilitation for placement;Manual facilitation for weight shifting;Verbal cues for technique Squat Pivot Transfer Details (indicate cue type and reason): pt actively resisting movement using R hand; unable to shift wt onto feet, remained in posterior pelvic tilt Locomotion - TBD Ambulation Ambulation: No Gait Gait: No Wheelchair Mobility Wheelchair Mobility: No  Trunk/Postural Assessment  Cervical Assessment Cervical Assessment: Within Functional Limits Thoracic Assessment Thoracic Assessment: Exceptions to [facility] Thoracic PROM Overall Thoracic PROM Comments: kyphotic Lumbar Assessment Lumbar Assessment: Exceptions to WFL (TBD) Postural Control Postural Control: Deficits on evaluation (TBD)  Balance Balance Balance Assessed: Yes Static Sitting Balance Static Sitting - Balance Support: Bilateral upper extremity supported;Feet supported Static Sitting - Level of Assistance: 1: +2 Total assist (initially +2 assist upon sitting up, improving to stand by  assist during tx) Static Sitting - Comment/# of Minutes: 5 Extremity Assessment      RLE Assessment RLE Assessment: Exceptions to [facility] RLE Strength RLE Overall Strength Comments: very grossly in sitting: hip flex  and knee ext 3+/5; to be further tested LLE Assessment LLE Assessment: Exceptions to [facility] LLE Strength LLE Overall Strength Comments: very grossly in sitting: hip flex and knee ext 3-/5; to be further assessed  FIM:  FIM - Bed/Chair Transfer Bed/Chair Transfer: 1: Two helpers FIM - Locomotion: Wheelchair Locomotion: Wheelchair: 0: Activity did not occur FIM - Locomotion: Ambulation Locomotion: Ambulation: 0: Activity did not occur FIM - Locomotion: Stairs Locomotion: Stairs: 0: Activity did not occur   Refer to Care Plan for Long Term Goals  Recommendations for other services: None  Discharge Criteria: Patient will be discharged from PT if patient refuses treatment 3 consecutive times without medical reason, if treatment goals  not met, if there is a change in medical status, if patient makes no progress towards goals or if patient is discharged from hospital.  The above assessment, treatment plan, treatment alternatives and goals were discussed and mutually agreed upon: No family available/patient unable  COOK,CAROLINE 09/12/2013, 5:11 PM

## 2013-09-12 NOTE — Progress Notes (Signed)
Inpatient Diabetes Program Recommendations  AACE/ADA: New Consensus Statement on Inpatient Glycemic Control (2013)  Target Ranges:  Prepandial:   less than 140 mg/dL      Peak postprandial:   less than 180 mg/dL (1-2 hours)      Critically ill patients:  140 - 180 mg/dL  Results for [name], Tevion A (MRN [id number]) as of 09/12/2013 12:41  Ref. Range 09/11/2013 16:30 09/11/2013 21:26 09/12/2013 07:35 09/12/2013 12:14  Glucose-Capillary Latest Range: 70-99 mg/dL 829231 (H) 562202 (H) 130142 (H) 164 (H)   Inpatient Diabetes Program Recommendations Correction (SSI): consider adding Novolog sensitive scale TID per Glycemic Control Order-set Thank you  Piedad ClimesGina Rue Tinnel BSN, RN,CDE Inpatient Diabetes Coordinator [phone] (team pager)

## 2013-09-13 ENCOUNTER — Inpatient Hospital Stay (HOSPITAL_COMMUNITY): Payer: Medicare Other

## 2013-09-13 ENCOUNTER — Inpatient Hospital Stay (HOSPITAL_COMMUNITY): Payer: Medicare Other | Admitting: Occupational Therapy

## 2013-09-13 ENCOUNTER — Inpatient Hospital Stay (HOSPITAL_COMMUNITY): Payer: Medicare Other | Admitting: Speech Pathology

## 2013-09-13 DIAGNOSIS — I6340 Cerebral infarction due to embolism of unspecified cerebral artery: Secondary | ICD-10-CM

## 2013-09-13 LAB — GLUCOSE, CAPILLARY
Glucose-Capillary: 214 mg/dL — ABNORMAL HIGH (ref 70–99)
Glucose-Capillary: 264 mg/dL — ABNORMAL HIGH (ref 70–99)
Glucose-Capillary: 443 mg/dL — ABNORMAL HIGH (ref 70–99)
Glucose-Capillary: 329 mg/dL — ABNORMAL HIGH (ref 70–99)

## 2013-09-13 MED ORDER — FLUCONAZOLE 100 MG PO TABS
100.0000 mg | ORAL_TABLET | Freq: Every day | ORAL | Status: AC
  Administered 2013-09-13 – 2013-09-26 (×14): 100 mg via ORAL
  Filled 2013-09-13 (×14): qty 1

## 2013-09-13 MED ORDER — INSULIN ASPART 100 UNIT/ML ~~LOC~~ SOLN
0.0000 [IU] | Freq: Three times a day (TID) | SUBCUTANEOUS | Status: DC
  Administered 2013-09-13: 15 [IU] via SUBCUTANEOUS
  Administered 2013-09-14: 11 [IU] via SUBCUTANEOUS
  Administered 2013-09-14: 8 [IU] via SUBCUTANEOUS
  Administered 2013-09-14: 2 [IU] via SUBCUTANEOUS

## 2013-09-13 MED ORDER — INSULIN DETEMIR 100 UNIT/ML ~~LOC~~ SOLN
25.0000 [IU] | Freq: Every day | SUBCUTANEOUS | Status: DC
  Administered 2013-09-13: 25 [IU] via SUBCUTANEOUS
  Filled 2013-09-13 (×2): qty 0.25

## 2013-09-13 MED ORDER — SODIUM CHLORIDE 0.9 % IJ SOLN
10.0000 mL | INTRAMUSCULAR | Status: DC | PRN
  Administered 2013-09-13: 20 mL
  Administered 2013-09-14 – 2013-09-15 (×2): 10 mL
  Administered 2013-09-15: 20 mL
  Administered 2013-09-16: 10 mL
  Administered 2013-09-16: 20 mL
  Administered 2013-09-17 – 2013-10-07 (×17): 10 mL
  Administered 2013-10-08 (×2): 20 mL
  Administered 2013-10-09 – 2013-10-10 (×6): 10 mL

## 2013-09-13 NOTE — Care Management Note (Signed)
Inpatient Rehabilitation Center Individual Statement of Services  Patient Name:  Elijah [name]  Date:  09/13/2013  Welcome to the Inpatient Rehabilitation Center.  Our goal is to provide you with an individualized program based on your diagnosis and situation, designed to meet your specific needs.  With this comprehensive rehabilitation program, you will be expected to participate in at least 3 hours of rehabilitation therapies Monday-Friday, with modified therapy programming on the weekends.  Your rehabilitation program will include the following services:  Physical Therapy (PT), Occupational Therapy (OT), Speech Therapy (ST), 24 hour per day rehabilitation nursing, Neuropsychology, Case Management (Social Worker), Rehabilitation Medicine, Nutrition Services and Pharmacy Services  Weekly team conferences will be held on Wednesday to discuss your progress.  Your Social Worker will talk with you frequently to get your input and to update you on team discussions.  Team conferences with you and your family in attendance may also be held.  Expected length of stay: 25-28 days  Overall anticipated outcome: min level  Depending on your progress and recovery, your program may change. Your Social Worker will coordinate services and will keep you informed of any changes. Your Social Worker's name and contact numbers are listed  below.  The following services may also be recommended but are not provided by the Inpatient Rehabilitation Center:   Driving Evaluations  Home Health Rehabiltiation Services  Outpatient Rehabilitation Services    Arrangements will be made to provide these services after discharge if needed.  Arrangements include referral to agencies that provide these services.  Your insurance has been verified to be:  UHC-Medicare Your primary doctor is:  Elijah [name]  Pertinent information will be shared with your doctor and your insurance company.  Social Worker:  Dossie DerBecky Dupree, SW  [phone] or [phone]) [phone]  Information discussed with and copy given to patient by: Lucy Chrisupree, Rebecca G, 09/13/2013, 10:09 AM

## 2013-09-13 NOTE — Progress Notes (Signed)
Social Work Assessment and Plan Social Work Assessment and Plan  Patient Details  Name: Elijah [name] MRN: [id number] Date of Birth: 08/14/1933  Today's Date: 09/13/2013  Problem List:  Patient Active Problem List   Diagnosis Date Noted  . CVA (cerebral infarction) 09/11/2013   Past Medical History: No past medical history on file. Past Surgical History: No past surgical history on file. Social History:  has no tobacco, alcohol, and drug history on file.  Family / Support Systems Marital Status: Married How Long?: 52 years Patient Roles: Spouse;Parent;Volunteer Spouse/Significant Other: Linda  [phone]-home Children: Barry-son  [phone]-home  [phone] Other Supports: Another son who is local also Anticipated Caregiver: Wife and son's Ability/Limitations of Caregiver: Wife has some health issues, son's work but are Teacher, musiclocal Caregiver Availability: 24/7 Family Dynamics: Close knit family who live close to one another.  Son's do work but will do all they can to assist Dad at home.  Wife is in good health and can assist, does have a leg she fractured in 2010 that acts up when it rains.  All are hopeful he will do well here.  Social History Preferred language: English Religion: None Cultural Background: No issues Education: Trade School Read: Yes Write: Yes Employment Status: Retired Fish farm managerLegal Hisotry/Current Legal Issues: No issues Guardian/Conservator: None-according to MD pt is not capable of making decisions at this time.  Will look toward wife to make any decisions while here, wife wants Fonnie BirkenheadBarry-son also included in the decision   Abuse/Neglect Physical Abuse: Denies Verbal Abuse: Denies Sexual Abuse: Denies Exploitation of patient/patient's resources: Denies Self-Neglect: Denies  Emotional Status Pt's affect, behavior adn adjustment status: Wife reports she has been worried and upset since this happened.  She feels [facility] and is glad he is  here.  She feels he is getting good care now.  He has always been independent and taken care of everything, very active and thrives on this.  This is a huge change it is hard to adjust. Recent Psychosocial Issues: Healthy prior to admission and was active Pyschiatric History: No issues-deferred depression screen due to pt unable to participate in screen due to deficits.  Will continue to re-evaluate throughout stay Substance Abuse History: No issues  Patient / Family Perceptions, Expectations & Goals Pt/Family understanding of illness & functional limitations: Wife can explain his stroke and deficits.  She reports they talk to the MD and find out as much information as possible.  They want to know how he is doing and waht can they do to assist him.  Wife states: " I want him fixed."  She has a basic understanding of his condition.  Much has happened to him since admission June 9. Premorbid pt/family roles/activities: HUsband, Father, grandfather, retiree, Home owner, etc Anticipated changes in roles/activities/participation: resume Pt/family expectations/goals: Wife states: " I want him better and able to come home."    [employer] Resources Community Agencies: None Premorbid Home Care/DME Agencies: None Transportation available at discharge: Family-son's transport Mom, she doesn;t drive anymore Resource referrals recommended: Support group (specify);Neuropsychology ([facility] SUpport Group)  Discharge Planning Living Arrangements: Spouse/significant other Support Systems: Spouse/significant other;Children;Friends/neighbors;Church/faith community Type of Residence: Private residence Insurance Resources: Media plannerrivate Insurance (specify) Investment banker, operational([employer]HC-Medicare) Financial Resources: Social Security (laided tile) Financial Screen Referred: No Living Expenses: Lives with family Money Management: Spouse;Patient Does the patient have any problems obtaining your medications?: No Home Management: Wife, 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  Son's work during the day and can assist at evenings and nights.  Wife reports being in good health but have not seen her, visits in the evenings when son get off work and bring her up to hospital.  Will see how much progress pt makes and if realistic to discharge home. Social Work Anticipated Follow Up Needs: HH/OP;SNF;Support Group  Clinical Impression Pt due to deficits is unable to participate in my assessment.  Will continue to follow and re-assess when appropriate.  Wife seems to be still processing all that has occurred since coming into the  Hospital.  He was very active and independent and it is hard for wife to see him like this.  Family is very supportive and want to take home, but will need to see how he progresses and come up with  A realistic discharge plan.  Will provide support to family and work on discharge.  Lucy Chrisupree, Rebecca G 09/13/2013, 2:59 PM

## 2013-09-13 NOTE — Progress Notes (Signed)
Subjective/Complaints: No abd pain, no nausea or vomiting No nausea or vomiting  Review of Systems - limited secondary to mental status Objective: Vital Signs: Blood pressure 122/74, pulse 93, temperature 97.3 F (36.3 C), temperature source Oral, resp. rate 18, weight 85.594 kg (188 lb 11.2 oz), SpO2 94.00%. Ct Abdomen Pelvis W Contrast  09/12/2013   CLINICAL DATA:  Inguinal mass.  Elevated LFTs.  EXAM: CT ABDOMEN AND PELVIS WITH CONTRAST  TECHNIQUE: Multidetector CT imaging of the abdomen and pelvis was performed using the standard protocol following bolus administration of intravenous contrast.  CONTRAST:  100 cc Omnipaque 300 IV.  COMPARISON:  None.  FINDINGS: Linear areas of atelectasis in the lower lobes bilaterally. Dense coronary artery calcifications. Heart is normal size. No effusions.  Gallbladder is contracted. Liver, spleen, adrenals, kidneys are unremarkable.  Calcifications scattered throughout the pancreas compatible with chronic pancreatitis. Small cystic lesion at the tip of the pancreatic tail measuring up to 2.0 cm. No pancreatic or biliary ductal dilatation.  Appendix is visualized and is normal. Right colonic and sigmoid diverticulosis. No active diverticulitis.  Bilateral inguinal hernias are present. The right inguinal hernia contains distal small bowel. The left inguinal hernia contains sigmoid colon. No evidence of bowel obstruction.  Urinary bladder is decompressed with Foley catheter in place. No free fluid, free air or adenopathy. Moderate stool throughout the colon, particularly rectosigmoid colon.  Aorta and iliac vessels are calcified, non aneurysmal. No free fluid, free air or adenopathy.  No acute bony abnormality or focal bone lesion. Degenerative changes in the lumbar spine.  IMPRESSION: Large bilateral inguinal hernias. The right inguinal hernia contains distal small bowel. The left inguinal hernia contains sigmoid colon. No evidence of bowel obstruction.  Moderate  stool burden throughout the colon. Right colonic and sigmoid diverticulosis.  Changes of chronic pancreatitis. 2 cm cystic lesion within the pancreatic tail may represent a small pseudocyst given the chronic pancreatitis changes, but is indeterminate. This could be further characterized with MRI with and without contrast if clinically desirable, or followed with repeat CT in 6 months.  Coronary artery disease.   Electronically Signed   By: Rolm Baptise M.D.   On: 09/12/2013 11:34   Dg Chest Port 1 View  09/11/2013   CLINICAL DATA:  PICC line placement  EXAM: PORTABLE CHEST - 1 VIEW  COMPARISON:  Portable exam 1821 hr compared to 09/04/2013  FINDINGS: Kyphotic positioning with rotation to the LEFT.  Tip of RIGHT arm PICC line projects over cavoatrial junction.  Normal heart size, mediastinal contours and pulmonary vascularity.  RIGHT basilar atelectasis.  Improved aeration LEFT lower lobe.  Upper lungs clear.  No pleural effusion or pneumothorax.  Bones demineralized.  K-wires at RIGHT acromion and distal RIGHT clavicle.  IMPRESSION: Tip of RIGHT arm PICC line projects over cavoatrial junction.  RIGHT basilar atelectasis persists with improved aeration in LEFT lower lobe.   Electronically Signed   By: Lavonia Dana M.D.   On: 09/11/2013 18:35   Results for orders placed during the hospital encounter of 09/11/13 (from the past 72 hour(s))  GLUCOSE, CAPILLARY     Status: Abnormal   Collection Time    09/11/13  4:30 PM      Result Value Ref Range   Glucose-Capillary 231 (*) 70 - 99 mg/dL  GLUCOSE, CAPILLARY     Status: Abnormal   Collection Time    09/11/13  9:26 PM      Result Value Ref Range   Glucose-Capillary 202 (*) 70 -  99 mg/dL  CBC WITH DIFFERENTIAL     Status: Abnormal   Collection Time    09/12/13  1:20 AM      Result Value Ref Range   WBC 12.2 (*) 4.0 - 10.5 K/uL   RBC 3.08 (*) 4.22 - 5.81 MIL/uL   Hemoglobin 9.5 (*) 13.0 - 17.0 g/dL   HCT 29.3 (*) 39.0 - 52.0 %   MCV 95.1  78.0 - 100.0  fL   MCH 30.8  26.0 - 34.0 pg   MCHC 32.4  30.0 - 36.0 g/dL   RDW 15.5  11.5 - 15.5 %   Platelets 221  150 - 400 K/uL   Neutrophils Relative % 71  43 - 77 %   Neutro Abs 8.7 (*) 1.7 - 7.7 K/uL   Lymphocytes Relative 23  12 - 46 %   Lymphs Abs 2.8  0.7 - 4.0 K/uL   Monocytes Relative 4  3 - 12 %   Monocytes Absolute 0.5  0.1 - 1.0 K/uL   Eosinophils Relative 2  0 - 5 %   Eosinophils Absolute 0.2  0.0 - 0.7 K/uL   Basophils Relative 0  0 - 1 %   Basophils Absolute 0.0  0.0 - 0.1 K/uL  COMPREHENSIVE METABOLIC PANEL     Status: Abnormal   Collection Time    09/12/13  1:20 AM      Result Value Ref Range   Sodium 144  137 - 147 mEq/L   Potassium 4.2  3.7 - 5.3 mEq/L   Chloride 109  96 - 112 mEq/L   CO2 23  19 - 32 mEq/L   Glucose, Bld 207 (*) 70 - 99 mg/dL   BUN 22  6 - 23 mg/dL   Creatinine, Ser 0.99  0.50 - 1.35 mg/dL   Calcium 8.8  8.4 - 10.5 mg/dL   Total Protein 7.2  6.0 - 8.3 g/dL   Albumin 1.8 (*) 3.5 - 5.2 g/dL   AST 142 (*) 0 - 37 U/L   ALT 109 (*) 0 - 53 U/L   Alkaline Phosphatase 1091 (*) 39 - 117 U/L   Total Bilirubin 0.7  0.3 - 1.2 mg/dL   GFR calc non Af Amer 77 (*) >90 mL/min   GFR calc Af Amer 89 (*) >90 mL/min   Comment: (NOTE)     The eGFR has been calculated using the CKD EPI equation.     This calculation has not been validated in all clinical situations.     eGFR's persistently <90 mL/min signify possible Chronic Kidney     Disease.  GLUCOSE, CAPILLARY     Status: Abnormal   Collection Time    09/12/13  7:35 AM      Result Value Ref Range   Glucose-Capillary 142 (*) 70 - 99 mg/dL  GAMMA GT     Status: Abnormal   Collection Time    09/12/13 10:25 AM      Result Value Ref Range   GGT 702 (*) 7 - 51 U/L  GLUCOSE, CAPILLARY     Status: Abnormal   Collection Time    09/12/13 12:14 PM      Result Value Ref Range   Glucose-Capillary 164 (*) 70 - 99 mg/dL  AMYLASE     Status: None   Collection Time    09/12/13  1:00 PM      Result Value Ref Range    Amylase 65  0 - 105 U/L  LIPASE, BLOOD  Status: Abnormal   Collection Time    09/12/13  1:00 PM      Result Value Ref Range   Lipase 126 (*) 11 - 59 U/L  GLUCOSE, CAPILLARY     Status: Abnormal   Collection Time    09/12/13  3:19 PM      Result Value Ref Range   Glucose-Capillary 358 (*) 70 - 99 mg/dL  GLUCOSE, CAPILLARY     Status: Abnormal   Collection Time    09/12/13  5:31 PM      Result Value Ref Range   Glucose-Capillary 298 (*) 70 - 99 mg/dL   Comment 1 Notify RN    GLUCOSE, CAPILLARY     Status: Abnormal   Collection Time    09/12/13  8:40 PM      Result Value Ref Range   Glucose-Capillary 336 (*) 70 - 99 mg/dL  GLUCOSE, CAPILLARY     Status: Abnormal   Collection Time    09/13/13  7:15 AM      Result Value Ref Range   Glucose-Capillary 214 (*) 70 - 99 mg/dL   Comment 1 Notify RN       HEENT: normal Cardio: irregular and no murmurs Resp: CTA B/L and unlabored GI: BS positive and large bilateral inguinal mass, non reducable mildly tender Extremity:  No Edema Skin:   Breakdown sacral erythema, eschar over L gluteal area, erythematous satellite lesions in peri area Neuro: Confused, Abnormal Sensory cannot assess secondary to mental status, Abnormal Motor 4/5 in BUE, 3/5 in BLE, Abnormal FMC Ataxic/ dec FMC, Dysarthric and Apraxic Musc/Skel:  Normal Gen NAD   Assessment/Plan: 1. Functional deficits secondary to bilateral cerebral infarct embolic which require 3+ hours per day of interdisciplinary therapy in a comprehensive inpatient rehab setting. Physiatrist is providing close team supervision and 24 hour management of active medical problems listed below. Physiatrist and rehab team continue to assess barriers to discharge/monitor patient progress toward functional and medical goals.  FIM: FIM - Bathing Bathing Steps Patient Completed: Chest Bathing: 1: Total-Patient completes 0-2 of 10 parts or less than 25%  FIM - Upper Body Dressing/Undressing Upper body  dressing/undressing: 0: Wears gown/pajamas-no public clothing FIM - Lower Body Dressing/Undressing Lower body dressing/undressing: 0: Wears gown/pajamas-no public clothing  FIM - Toileting Toileting: 0: Activity did not occur  FIM - Air cabin crew Transfers: 0-Activity did not occur  FIM - IT sales professional Transfer: 1: Mechanical lift  FIM - Locomotion: Wheelchair Locomotion: Wheelchair: 0: Activity did not occur FIM - Locomotion: Ambulation Locomotion: Ambulation: 0: Activity did not occur  Comprehension Comprehension Mode: Auditory Comprehension: 2-Understands basic 25 - 49% of the time/requires cueing 51 - 75% of the time  Expression Expression Mode: Verbal Expression: 2-Expresses basic 25 - 49% of the time/requires cueing 50 - 75% of the time. Uses single words/gestures.  Social Interaction Social Interaction: 2-Interacts appropriately 25 - 49% of time - Needs frequent redirection.  Problem Solving Problem Solving: 1-Solves basic less than 25% of the time - needs direction nearly all the time or does not effectively solve problems and may need a restraint for safety  Memory Memory: 1-Recognizes or recalls less than 25% of the time/requires cueing greater than 75% of the time  Medical Problem List and Plan:  1. Functional deficits secondary to acute cerebral white matter bilateral infarct  2. DVT Prophylaxis/Anticoagulation: Eliquis. Monitor for any bleeding episodes  3. Pain Management: Tylenol as needed  4. Dysphagia with decreased nutritional storage. Dysphagia 1  nectar liquids. Followup speech therapy monitor hydration. Consider appetite stimulant  5. Neuropsych: This patient is not capable of making decisions on his own behalf.  6. Hypertension/atrial fibrillation. Norvasc 10 mg daily, hydralazine 10 mg 4 times a day, metoprolol 25 mg every 8 hours. Monitor with increased mobility  7 ID/methicillin sensitive staph aureus. Continue cefazolin 2 g  intravenously every 8 hours x4 weeks total initiated 08/29/2013. Monitor for any fever  8. Diabetes mellitus with peripheral neuropathy. Check CBGs a.c. and at bedtime. Levemir 20 units each bedtime.  9. Integumentary: local skin care, turning, maintain foley for now, mattress overlay, WOC, Diflucan for yeast 10.  Inguinal mass, bilateral hernia, will see if pt has been seen by Gen Surg in past, obviously chronic and not incarcerated, given recent CVA, no elective surg recommended at this time 11.  Elevated alk phos and lipase,? etiol abd exam normal except hernia LOS (Days) 2 A FACE TO FACE EVALUATION WAS PERFORMED  KIRSTEINS,ANDREW E 09/13/2013, 8:34 AM

## 2013-09-13 NOTE — Progress Notes (Signed)
Occupational Therapy Session Note  Patient Details  Name: Elijah [name] MRN: [id number] Date of Birth: 12/28/1933  Today's Date: 09/13/2013 Time: 0800-0900 and [id number] Time Calculation (min): 60 min and 30 min  Short Term Goals: Week 1:  OT Short Term Goal 1 (Week 1): Pt will be able to sit EOB with mod A x 1 for 10 min to engage in UB bathing and dressing. OT Short Term Goal 2 (Week 1): Pt will be able to bathe his UB with min A. OT Short Term Goal 3 (Week 1): Pt will be able to don a tshirt with mod A. OT Short Term Goal 4 (Week 1): Pt will be able to transfer to w/c from bed with max A x1. OT Short Term Goal 5 (Week 1): Pt will demonstrate improved direction following during simple grooming with min cues.  Skilled Therapeutic Interventions/Progress Updates:    Visit 1:  Pain: No c/o pain. Tx: Pt seen for ADL retraining of eating, grooming, bathing this am with a focus on attention and visual scanning to the R. Pt was sleeping in bed and it took awhile to arouse pt.  He was encouraged to wash his face, but continually verbally mimicked my requests. For example, "Elijah [name] please wash your face" Pt would repeat with a child like voice through out the start of the session. Pt would not use his hand and arms to try to wash his face or body even with hand over hand guiding. Pt needed max cues to look to his R but his eyes would immediately shift back to left.  Pt did state where he was and that he was in rehab, but it was very difficult to engage him.  Pt's breakfast tray was brought to him. Total A x 2 to adjust pt in bed.  He needed max A for the first 70% of the meal as he was having difficulty with using either hand and his attention was extremely poor. Pt demonstrated poor oral motor skills with Dys 1 eggs/ grits, not using his lips or tongue. Pt was then offered applesauce and sherbet and he became more alert, used his R hand to self feed with spoon.He also demonstrated adequate oral motor  skills with pushing food back into his mouth.  Nurse tech arrived to continue breakfast with pt at end of session.  Visit 2: Pain: no c/o pain Tx: Pt in bed sleeping at start of session.  Attempted for 10 min to arouse pt with UE ROM exercises, pt would wake briefly for less than 30 sec at a time.  A second therapist was available to assist with sitting pt to EOB. Pt given short, simple verbal instructions to sit to EOB. Pt became more alert and did put in good effort to sit up. Needed max A to lift trunk off bed. Once in sitting, the amount of A pt required varied from max A from posterior lean to very close Supervision when pt leaned forward slightly. Pt tolerated sitting for 10 minutes. Attempted to stand pt, but he was pushing back with great resistance. Adjusted pt back in bed. At end of session, pt stated he needed to have a BM. Pt fell asleep quickly after that, nursing informed that pt may need to be changed. Bed alarm set with call light in reach.  Therapy Documentation Precautions:  Precautions Precautions: Fall Precaution Comments: pt resists movement Restrictions Weight Bearing Restrictions: No       Pain Assessment Pain Assessment: No/denies pain ADL:  ADL ADL Comments: Total A  See FIM for current functional status  Therapy/Group: Individual Therapy  SAGUIER,JULIA 09/13/2013, 10:39 AM

## 2013-09-13 NOTE — Progress Notes (Signed)
09/13/13 1628 nsg MD made aware of patients cbg.

## 2013-09-13 NOTE — Plan of Care (Signed)
Problem: RH SKIN INTEGRITY Goal: RH STG SKIN FREE OF INFECTION/BREAKDOWN Pt will remain free of infection and further skin breakdown with max assistance  Outcome: Progressing Continued barrier cream and nystatin powder; antifungal medication ordered

## 2013-09-13 NOTE — Plan of Care (Signed)
Problem: RH KNOWLEDGE DEFICIT Goal: RH STG INCREASE KNOWLEDGE OF HYPERTENSION Outcome: Not Progressing Not retaining at this time needs to be cued most of the time

## 2013-09-13 NOTE — Progress Notes (Signed)
Speech Language Pathology Daily Session Note  Patient Details  Name: Elijah [name] MRN: [id number] Date of Birth: 07/30/1933  Today's Date: 09/13/2013 Time: [id number] Time Calculation (min): 40 min  Short Term Goals: Week 1: SLP Short Term Goal 1 (Week 1): Pt will utilize recommended strategies with recommended diet with Mod cues SLP Short Term Goal 2 (Week 1): Pt will utilize speech intelligibility strategies at the phrase level with Mod cues SLP Short Term Goal 3 (Week 1): Pt will sustain attention to basic familiar task for 5 minutes with Mod cues SLP Short Term Goal 4 (Week 1): Pt will verbalize at least 1cognitive and 1 physical impairment with Mod cues SLP Short Term Goal 5 (Week 1): Pt will demonstrate basic problem solving during basic, familiar task with Mod cues SLP Short Term Goal 6 (Week 1): Pt will utilize external memory aids to recall new and daily information with Max cues  Skilled Therapeutic Interventions: Skilled treatment session focused on addressing dysphagia and cognition goals.  SLP facilitated session with set-up and positioning assist for optional ability to self-feed.  Patient consumed Dys 1 textures and nectar-thick liquids with Mod physical assist and Max verbal cues for consumption of 4 bites.  Clinician increased physical cues to Max assist physical and verbal cues for 3 bites and then finally 2 bites at Total assist.  Patient sustained attention to PO intake for 1 bite at a time with Max cuing from SLP.  Patient's discomfort from upright positioning in chair appeared to impact abilities and at end of session patient reported pain and a bowel movement as a result patient was assisted back to bed 3+ and left in bed with nurse tech at side.      FIM:  Comprehension Comprehension Mode: Auditory Comprehension: 2-Understands basic 25 - 49% of the time/requires cueing 51 - 75% of the time Expression Expression Mode: Verbal Expression: 2-Expresses basic 25 - 49% of  the time/requires cueing 50 - 75% of the time. Uses single words/gestures. Social Interaction Social Interaction: 2-Interacts appropriately 25 - 49% of time - Needs frequent redirection. Problem Solving Problem Solving: 1-Solves basic less than 25% of the time - needs direction nearly all the time or does not effectively solve problems and may need a restraint for safety Memory Memory: 1-Recognizes or recalls less than 25% of the time/requires cueing greater than 75% of the time FIM - Eating Eating Activity: 4: Helper occasionally scoops food on utensil;5: Needs verbal cues/supervision;5: Set-up assist for open containers;4: Helper checks for pocketed food;4: Helper occasionally brings food to mouth;1: Helper feeds patient;4: Help with managing cup/glass  Pain Pain Assessment Pain Assessment: Faces Faces Pain Scale: Hurts whole lot Pain Location: Buttocks Pain Onset: Gradual Pain Intervention(s): RN made aware;Repositioned Multiple Pain Sites: No  Therapy/Group: Individual Therapy  Charlane FerrettiMelissa Bowie, M.A., CCC-SLP [phone]  BOWIE,MELISSA 09/13/2013, 12:29 PM

## 2013-09-13 NOTE — Progress Notes (Signed)
Physical Therapy Session Note  Patient Details  Name: Elijah [name] MRN: [id number] Date of Birth: 07/22/1933  Today's Date: 09/13/2013 Time: 0930-1030 Time Calculation (min): 60 min  Short Term Goals: Week 1:  PT Short Term Goal 1 (Week 1): pt will roll L with min assist PT Short Term Goal 2 (Week 1): pt will transfer bed >< w/c with assist of 1 person PT Short Term Goal 3 (Week 1): pt will tolerate standing x 5 minutes  Skilled Therapeutic Interventions/Progress Updates:   No pain voiced, but pt obviously uncomfortable sitting upright in w/c, trunk leaning R to un-weight L buttock. Pt tolerated more symmetrical sitting in tilted position.  Tx focused on orientation, bed mobility, sitting balance, activity tolerance, neuro re-ed,  bed> w/c transfer.  Pt extremely lethargic, with eyes closed and dozing the majority of session.  Pt rolling L with max assist, +2 for L sidelying> sit EOB, and +2 SBT to R.  Attempted to have pt stand from raised bed, and pt initiated movement, but unable to elevate hips. Pt did not resist movement as strongly as yesterday, and wt shifted intermittently to assist with SBT.  Pt assisted with legrest management, following directions for removing legs and placing back on legrests.  Pt was conversational for short conversation, stating his wife's and sons' names, and where sons work.  In tilt-in -space w/c, pt positioned with feet on tall footrest for comfort.  Left at nurse's station with quick release belt on, and RN informed.    Therapy Documentation Precautions:  Precautions Precautions: Fall Precaution Comments: pt resists movement Restrictions Weight Bearing Restrictions: No     See FIM for current functional status  Therapy/Group: Individual Therapy  COOK,CAROLINE 09/13/2013, 4:49 PM

## 2013-09-13 NOTE — Progress Notes (Signed)
Inpatient Diabetes Program Recommendations  AACE/ADA: New Consensus Statement on Inpatient Glycemic Control (2013)  Target Ranges:  Prepandial:   less than 140 mg/dL      Peak postprandial:   less than 180 mg/dL (1-2 hours)      Critically ill patients:  140 - 180 mg/dL  Results for Elijah [name], [name] (MRN [id number]) as of 09/13/2013 12:15  Ref. Range 09/12/2013 15:19 09/12/2013 17:31 09/12/2013 20:40 09/13/2013 07:15 09/13/2013 11:39  Glucose-Capillary Latest Range: 70-99 mg/dL 409358 (H) 811298 (H) 914336 (H) 214 (H) 264 (H)   Inpatient Diabetes Program Recommendations Correction (SSI): consider adding Novolog sensitive scale TID per Glycemic Control Order-set May also need to titrate basal dose up. Thank you  Piedad ClimesGina Davis BSN, RN,CDE Inpatient Diabetes Coordinator [phone] (team pager)

## 2013-09-14 ENCOUNTER — Inpatient Hospital Stay (HOSPITAL_COMMUNITY): Payer: Medicare Other | Admitting: *Deleted

## 2013-09-14 ENCOUNTER — Inpatient Hospital Stay (HOSPITAL_COMMUNITY): Payer: Medicare Other | Admitting: Occupational Therapy

## 2013-09-14 ENCOUNTER — Inpatient Hospital Stay (HOSPITAL_COMMUNITY): Payer: Medicare Other

## 2013-09-14 DIAGNOSIS — I6340 Cerebral infarction due to embolism of unspecified cerebral artery: Secondary | ICD-10-CM

## 2013-09-14 LAB — GLUCOSE, CAPILLARY
Glucose-Capillary: 141 mg/dL — ABNORMAL HIGH (ref 70–99)
Glucose-Capillary: 280 mg/dL — ABNORMAL HIGH (ref 70–99)
Glucose-Capillary: 304 mg/dL — ABNORMAL HIGH (ref 70–99)
Glucose-Capillary: 377 mg/dL — ABNORMAL HIGH (ref 70–99)
Glucose-Capillary: 378 mg/dL — ABNORMAL HIGH (ref 70–99)

## 2013-09-14 LAB — COMPREHENSIVE METABOLIC PANEL
ALT: 200 U/L — ABNORMAL HIGH (ref 0–53)
AST: 313 U/L — ABNORMAL HIGH (ref 0–37)
Albumin: 1.8 g/dL — ABNORMAL LOW (ref 3.5–5.2)
Alkaline Phosphatase: 1587 U/L — ABNORMAL HIGH (ref 39–117)
BUN: 30 mg/dL — ABNORMAL HIGH (ref 6–23)
CO2: 23 mEq/L (ref 19–32)
Calcium: 9.1 mg/dL (ref 8.4–10.5)
Chloride: 117 mEq/L — ABNORMAL HIGH (ref 96–112)
Creatinine, Ser: 1 mg/dL (ref 0.50–1.35)
GFR calc Af Amer: 82 mL/min — ABNORMAL LOW (ref >90)
GFR calc non Af Amer: 70 mL/min — ABNORMAL LOW (ref >90)
Glucose, Bld: 179 mg/dL — ABNORMAL HIGH (ref 70–99)
Potassium: 3.9 mEq/L (ref 3.7–5.3)
Sodium: 153 mEq/L — ABNORMAL HIGH (ref 137–147)
Total Bilirubin: 0.5 mg/dL (ref 0.3–1.2)
Total Protein: 7.7 g/dL (ref 6.0–8.3)

## 2013-09-14 LAB — IRON AND TIBC
Iron: 30 ug/dL — ABNORMAL LOW (ref 42–135)
Saturation Ratios: 16 % — ABNORMAL LOW (ref 20–55)
TIBC: 182 ug/dL — ABNORMAL LOW (ref 215–435)
UIBC: 152 ug/dL (ref 125–400)

## 2013-09-14 LAB — HEPATITIS B SURFACE ANTIBODY,QUALITATIVE: Hep B S Ab: NEGATIVE

## 2013-09-14 LAB — HEPATITIS B SURFACE ANTIGEN: Hepatitis B Surface Ag: NEGATIVE

## 2013-09-14 LAB — LIPASE, BLOOD: Lipase: 116 U/L — ABNORMAL HIGH (ref 11–59)

## 2013-09-14 LAB — FERRITIN: Ferritin: 1492 ng/mL — ABNORMAL HIGH (ref 22–322)

## 2013-09-14 LAB — HEPATITIS C ANTIBODY: HCV Ab: NEGATIVE

## 2013-09-14 MED ORDER — INSULIN DETEMIR 100 UNIT/ML ~~LOC~~ SOLN
25.0000 [IU] | Freq: Every day | SUBCUTANEOUS | Status: DC
  Administered 2013-09-14 – 2013-09-18 (×5): 25 [IU] via SUBCUTANEOUS
  Filled 2013-09-14 (×6): qty 0.25

## 2013-09-14 MED ORDER — INSULIN ASPART 100 UNIT/ML ~~LOC~~ SOLN: 0.0000 [IU] | Freq: Three times a day (TID) | SUBCUTANEOUS | Status: DC

## 2013-09-14 MED ORDER — INSULIN ASPART 100 UNIT/ML ~~LOC~~ SOLN
0.0000 [IU] | Freq: Every day | SUBCUTANEOUS | Status: DC
  Administered 2013-09-14: 5 [IU] via SUBCUTANEOUS
  Administered 2013-09-16: 4 [IU] via SUBCUTANEOUS
  Administered 2013-09-17: 3 [IU] via SUBCUTANEOUS
  Administered 2013-09-18: 4 [IU] via SUBCUTANEOUS
  Administered 2013-09-19: 2 [IU] via SUBCUTANEOUS
  Administered 2013-09-20: 3 [IU] via SUBCUTANEOUS
  Administered 2013-09-21 – 2013-10-07 (×6): 2 [IU] via SUBCUTANEOUS

## 2013-09-14 MED ORDER — INSULIN ASPART 100 UNIT/ML ~~LOC~~ SOLN
0.0000 [IU] | Freq: Three times a day (TID) | SUBCUTANEOUS | Status: DC
  Administered 2013-09-15 (×3): 5 [IU] via SUBCUTANEOUS
  Administered 2013-09-16: 2 [IU] via SUBCUTANEOUS
  Administered 2013-09-17: 3 [IU] via SUBCUTANEOUS
  Administered 2013-09-18: 2 [IU] via SUBCUTANEOUS
  Administered 2013-09-18 – 2013-09-19 (×4): 5 [IU] via SUBCUTANEOUS
  Administered 2013-09-20 (×2): 3 [IU] via SUBCUTANEOUS
  Administered 2013-09-20 – 2013-09-21 (×2): 5 [IU] via SUBCUTANEOUS
  Administered 2013-09-21: 2 [IU] via SUBCUTANEOUS
  Administered 2013-09-21: 8 [IU] via SUBCUTANEOUS
  Administered 2013-09-22: 3 [IU] via SUBCUTANEOUS
  Administered 2013-09-22: 8 [IU] via SUBCUTANEOUS
  Administered 2013-09-22: 5 [IU] via SUBCUTANEOUS
  Administered 2013-09-23: 8 [IU] via SUBCUTANEOUS
  Administered 2013-09-23: 08:00:00 via SUBCUTANEOUS
  Administered 2013-09-23: 3 [IU] via SUBCUTANEOUS
  Administered 2013-09-24: 5 [IU] via SUBCUTANEOUS
  Administered 2013-09-24 (×2): 2 [IU] via SUBCUTANEOUS
  Administered 2013-09-25 (×2): 3 [IU] via SUBCUTANEOUS
  Administered 2013-09-26 – 2013-09-27 (×5): 2 [IU] via SUBCUTANEOUS
  Administered 2013-09-28: 5 [IU] via SUBCUTANEOUS
  Administered 2013-09-28 – 2013-09-29 (×2): 3 [IU] via SUBCUTANEOUS
  Administered 2013-09-29 (×2): 2 [IU] via SUBCUTANEOUS
  Administered 2013-09-30 (×2): 3 [IU] via SUBCUTANEOUS
  Administered 2013-09-30: 2 [IU] via SUBCUTANEOUS
  Administered 2013-10-01: 8 [IU] via SUBCUTANEOUS
  Administered 2013-10-01: 3 [IU] via SUBCUTANEOUS
  Administered 2013-10-02: 2 [IU] via SUBCUTANEOUS
  Administered 2013-10-02 – 2013-10-04 (×4): 3 [IU] via SUBCUTANEOUS
  Administered 2013-10-04: 2 [IU] via SUBCUTANEOUS
  Administered 2013-10-05 (×2): 3 [IU] via SUBCUTANEOUS
  Administered 2013-10-06 (×2): 2 [IU] via SUBCUTANEOUS
  Administered 2013-10-07: 3 [IU] via SUBCUTANEOUS
  Administered 2013-10-07: 5 [IU] via SUBCUTANEOUS
  Administered 2013-10-08: 2 [IU] via SUBCUTANEOUS
  Administered 2013-10-08 – 2013-10-10 (×4): 3 [IU] via SUBCUTANEOUS

## 2013-09-14 MED ORDER — INSULIN DETEMIR 100 UNIT/ML ~~LOC~~ SOLN
25.0000 [IU] | Freq: Every day | SUBCUTANEOUS | Status: DC
  Filled 2013-09-14: qty 0.25

## 2013-09-14 NOTE — Progress Notes (Signed)
Physical Therapy Session Note  Patient Details  Name: Elijah [name] MRN: [id number] Date of Birth: 01/11/1934  Today's Date: 09/14/2013 Time: [id number] and 11:40-12:05 (25min)  Time Calculation (min): 60 min  Short Term Goals: Week 1:  PT Short Term Goal 1 (Week 1): pt will roll L with min assist PT Short Term Goal 2 (Week 1): pt will transfer bed >< w/c with assist of 1 person PT Short Term Goal 3 (Week 1): pt will tolerate standing x 5 minutes  Skilled Therapeutic Interventions/Progress Updates:  First tx focused on functional mobility, cognitive remediation, and NMR via forced use, manual facilitation, and verbal cues. Pt needed +2/total A for all aspects of mobility, but was more alert and able to follow 1-step commands 50% of the time as well as assist with transfers reaching and leaning. Pt oriented x3 following mod cues and was able to follow instructions for self-swabbing and drinking nectar-thick.   Performed bed mobility with total A for advancing LEs and bringing trunk forward. Pt able to reach for targets to grab and pull.    Pt needed +2 assist for slidingboard transfers bed>WC and WC>WC for better fitted seating. Assist needed for boar dmanagement, anterior weight shift and hip advancement.   Second tx focused on sustained attention and upright posture in standing frame. Pt very lethargic compared to morning, tx, unable to keep eyes open >3 seconds.   Performed manual facilitation for upright trunk and head in standing frame x5310min (WC moved so therapist could come closer for assistance. Attempted various UE tasks for attention, but pt limited by fatigue. Pt unable to assist with positioning in WC, but was able to use UEs to pull/push on frame to lean/forward.   Attempted single-step commands in WC, but also limited by fatigue. Positioned blanket rolls for optimal positioning in WC. Pt left up at nurses station, handoff.        Therapy Documentation Precautions:   Precautions Precautions: Fall Precaution Comments: pt resists movement Restrictions Weight Bearing Restrictions: No    Pain: no complaints    See FIM for current functional status  Therapy/Group: Individual Therapy Clydene Lamingole Kampen, PT, DPT   09/14/2013, 9:45 AM

## 2013-09-14 NOTE — Progress Notes (Signed)
Occupational Therapy Session Note  Patient Details  Name: Elijah CardJames A Schnetzer MRN: [id number] Date of Birth: 05/14/1933  Today's Date: 09/14/2013 Time: 0930-1100 Time Calculation (min): 90 min  Short Term Goals: Week 1:  OT Short Term Goal 1 (Week 1): Pt will be able to sit EOB with mod A x 1 for 10 min to engage in UB bathing and dressing. OT Short Term Goal 2 (Week 1): Pt will be able to bathe his UB with min A. OT Short Term Goal 3 (Week 1): Pt will be able to don a tshirt with mod A. OT Short Term Goal 4 (Week 1): Pt will be able to transfer to w/c from bed with max A x1. OT Short Term Goal 5 (Week 1): Pt will demonstrate improved direction following during simple grooming with min cues.  Skilled Therapeutic Interventions/Progress Updates:    Pt seen this session for ADL retraining with a focus on sustained attention, alertness, and functional mobility. Prior to start of session, assisted the PT with a SB from one w/c to the next to provide pt with increased support in sitting. Pt required total A x 2 to use SB and forward lean. Once in chair, pt engaged in basic grooming of oral care and washing his face with cues to attend to task. Pt can attend for 3-5 seconds at a time with basic functional tasks. He needs a cue to return to task. Pt was more focused and alert today with engagement in donning a shirt and shorts, Pt required total A but he did initiated and attempted to pull the clothing over his limbs. Pt required total A to come into a partial stand to adjust pants over his hips.  Pt did spontaneously forward lean without cues to prepare for the stand. Pt stated he needed to use the bathroom. Using grab bars,pt was able to assist more but continues to require total A with squat pivot, sit to partial stand and total for toileting tasks. Pt adjusted in w/c with rolled towels on each side of thighs to reduce hip abduction in sitting.  Pt taken to his nurse tech to provide him with  supervision.  Therapy Documentation Precautions:  Precautions Precautions: Fall Precaution Comments: pt resists movement Restrictions Weight Bearing Restrictions: No   Pain: Pain Assessment Pain Assessment: No/denies pain ADL: ADL ADL Comments: Total A     See FIM for current functional status  Therapy/Group: Individual Therapy  SAGUIER,JULIA 09/14/2013, 11:50 AM

## 2013-09-14 NOTE — Progress Notes (Signed)
Inpatient Diabetes Program Recommendations  AACE/ADA: New Consensus Statement on Inpatient Glycemic Control (2013)  Target Ranges:  Prepandial:   less than 140 mg/dL      Peak postprandial:   less than 180 mg/dL (1-2 hours)      Critically ill patients:  140 - 180 mg/dL  Results for Elijah [name], Elijah [name] (MRN [id number]) as of 09/14/2013 12:41  Ref. Range 09/13/2013 07:15 09/13/2013 11:39 09/13/2013 16:22 09/13/2013 21:29 09/14/2013 07:21 09/14/2013 12:14  Glucose-Capillary Latest Range: 70-99 mg/dL 782214 (H) 956264 (H) 213443 (H) 329 (H) 141 (H) 304 (H)   Between meal snack Ensure pudding is likely impacting cbgs.  Consider adding Novolog prandial dose. Thank you  Piedad ClimesGina Davis BSN, RN,CDE Inpatient Diabetes Coordinator [phone] (team pager)

## 2013-09-14 NOTE — Progress Notes (Signed)
Speech Language Pathology Daily Session Note  Patient Details  Name: Elijah [name] MRN: [id number] Date of Birth: 12/26/1933  Today's Date: 09/14/2013 Time: [id number] Time Calculation (min): 43 min  Short Term Goals: Week 1: SLP Short Term Goal 1 (Week 1): Pt will utilize recommended strategies with recommended diet with Mod cues SLP Short Term Goal 2 (Week 1): Pt will utilize speech intelligibility strategies at the phrase level with Mod cues SLP Short Term Goal 3 (Week 1): Pt will sustain attention to basic familiar task for 5 minutes with Mod cues SLP Short Term Goal 4 (Week 1): Pt will verbalize at least 1cognitive and 1 physical impairment with Mod cues SLP Short Term Goal 5 (Week 1): Pt will demonstrate basic problem solving during basic, familiar task with Mod cues SLP Short Term Goal 6 (Week 1): Pt will utilize external memory aids to recall new and daily information with Max cues  Skilled Therapeutic Interventions: Skilled treatment focused on cognitive goals. SLP facilitated session with Max cues for orientation to self, time, and location. Pt verbalized that he "had a stroke" without further cueing. Pt required Max cues throughout session for brief seconds of sustained attention to structured table tasks. Pt was however able to engage in conversation about biographical information for ~5 minutes without redirection, and provided accurate responses per information available in his chart. Pt receptively identified colors from a field of two with Max cues for sustained attention.  Continue plan of care.   FIM:  Comprehension Comprehension Mode: Auditory Comprehension: 2-Understands basic 25 - 49% of the time/requires cueing 51 - 75% of the time Expression Expression Mode: Verbal Expression: 2-Expresses basic 25 - 49% of the time/requires cueing 50 - 75% of the time. Uses single words/gestures. Social Interaction Social Interaction: 2-Interacts appropriately 25 - 49% of time -  Needs frequent redirection. Problem Solving Problem Solving: 1-Solves basic less than 25% of the time - needs direction nearly all the time or does not effectively solve problems and may need a restraint for safety Memory Memory: 1-Recognizes or recalls less than 25% of the time/requires cueing greater than 75% of the time  Pain Pain Assessment Pain Assessment: Faces Faces Pain Scale: No hurt  Therapy/Group: Individual Therapy   Elijah [name], M.A. CCC-SLP [phone]  Elijah Hamaiewonsky, Elijah [name] 09/14/2013, 3:59 PM

## 2013-09-14 NOTE — IPOC Note (Signed)
Overall Plan of Care [facility](IPOC) Patient Details Name: Elijah [name] MRN: [id number] DOB: 03/20/1934  Admitting Diagnosis: CVA  Hospital Problems: Active Problems:   CVA (cerebral infarction)     Functional Problem List: Nursing Bladder;Bowel;Edema;Endurance;Medication Management;Nutrition;Pain;Safety;Skin Integrity;Perception  PT Balance;Behavior;Endurance;Motor;Safety;Perception;Sensory;Skin Integrity  OT Balance;Behavior;Cognition;Endurance;Motor;Safety;Perception;Vision  SLP Cognition  TR         Basic ADL's: OT Eating;Grooming;Bathing;Dressing;Toileting     Advanced  ADL's: OT       Transfers: PT Bed Mobility;Bed to Chair;Car;Furniture  OT Toilet;Tub/Shower     Locomotion: PT Wheelchair Mobility (ambulation and stairs TBD)     Additional Impairments: OT Fuctional Use of Upper Extremity  SLP Swallowing;Communication;Social Cognition expression Problem Solving;Memory;Attention;Awareness  TR      Anticipated Outcomes Item Anticipated Outcome  Self Feeding supervision/ set up  Swallowing  Min with least restrictive PO   Basic self-care  supervision for grooming and UB dressing, min bathing and LB dressing  Toileting  min A   Bathroom Transfers min A  Bowel/Bladder  manage bowel and bladder with minimal assist.   Transfers  min assist basic and car  Locomotion  supervision w/c x 150'; ambulation and stairs TBD  Communication  Min  Cognition  Min  Pain  3 or less  Safety/Judgment  Mod assist   Therapy Plan: PT Intensity: Minimum of 1-2 x/day ,45 to 90 minutes PT Frequency: 5 out of 7 days PT Duration Estimated Length of Stay: 25-28 days OT Intensity: Minimum of 1-2 x/day, 45 to 90 minutes OT Frequency: 5 out of 7 days OT Duration/Estimated Length of Stay: 3-4 weeks SLP Intensity: Minumum of 1-2 x/day, 30 to 90 minutes SLP Frequency: 5 out of 7 days SLP Duration/Estimated Length of Stay: 25-28 days       Team Interventions: Nursing  Interventions Patient/Family Education;Bladder Management;Bowel Management;Disease Management/Prevention;Pain Management;Skin Care/Wound Management;Medication Management;Dysphagia/Aspiration Precaution Training;Cognitive Remediation/Compensation  PT interventions Ambulation/gait training;Balance/vestibular training;Cognitive remediation/compensation;Discharge planning;DME/adaptive equipment instruction;Functional electrical stimulation;Functional mobility training;Patient/family education;Neuromuscular re-education;Splinting/orthotics;Therapeutic Exercise;Therapeutic Activities;Stair training;UE/LE Strength taining/ROM;UE/LE Coordination activities;Visual/perceptual remediation/compensation;Wheelchair propulsion/positioning  OT Interventions Balance/vestibular training;Cognitive remediation/compensation;Discharge planning;DME/adaptive equipment instruction;Functional mobility training;Neuromuscular re-education;Patient/family education;Self Care/advanced ADL retraining;Therapeutic Activities;Therapeutic Exercise;UE/LE Strength taining/ROM;Visual/perceptual remediation/compensation;UE/LE Coordination activities  SLP Interventions Cognitive remediation/compensation;Cueing hierarchy;Dysphagia/aspiration precaution training;Environmental controls;Functional tasks;Internal/external aids;Oral motor exercises;Patient/family education;Speech/Language facilitation  TR Interventions    SW/CM Interventions Discharge Planning;Psychosocial Support;Patient/Family Education    Team Discharge Planning: Destination: PT-Home ,OT- Home , SLP-Home Projected Follow-up: PT-Home health PT, OT-  Home health OT, SLP-Home Health SLP;24 hour supervision/assistance Projected Equipment Needs: PT-Wheelchair (measurements);Wheelchair cushion (measurements) (AD TBD), OT- 3 in 1 bedside comode;Tub/shower bench, SLP-None recommended by SLP Equipment Details: PT- , OT-  Patient/family involved in discharge planning: PT- Patient  unable/family or caregiver not available,  OT-Patient unable/family or caregiver not available, SLP-Patient unable/family or caregive not available  MD ELOS: 20-25d Medical Rehab Prognosis:  Good Assessment: 80 year old right-handed Caucasian male with history of diabetes mellitus with peripheral neuropathy. Patient independent and active prior to admission. Admitted to [facility] 78/10/2013 with slurred speech as well as diffuse weakness. His blood sugar was in the 130s. EKG showed frequent PVCs and junctional rhythm as well as tachycardia with nonspecific ST-T wave changes. Troponin level 0.02. CT of the head negative for acute changes. MRI of the brain showed multiple areas of acute infarct in the cerebral white matter bilaterally. Echocardiogram with ejection fraction of 60% without thrombi. Chest x-ray negative. Carotid Dopplers with no significant ICA stenosis. Initially placed on aspirin for CVA prophylaxis changed to eliquis secondary to runs of  atrial fibrillation. An attempted TEE was aborted due to a lot of bleeding in the mouth due to the probe that needed cautery by ENT Dr. Andee PolesVaught. The Elliquis was was held for one day and later resumed without any further bleeding episodes  Now requiring 24/7 Rehab RN,MD, as well as CIR level PT, OT and SLP.  Treatment team will focus on ADLs, swallowing, cognition and mobility with goals set at min A    See Team Conference Notes for weekly updates to the plan of care

## 2013-09-14 NOTE — Progress Notes (Signed)
Subjective/Complaints: No abd pain, no nausea or vomiting No nausea or vomiting Confused oriented to self (unchanged vs admit) Review of Systems - limited secondary to mental status Objective: Vital Signs: Blood pressure 131/79, pulse 91, temperature 98.3 F (36.8 C), temperature source Oral, resp. rate 18, weight 85.594 kg (188 lb 11.2 oz), SpO2 95.00%. Ct Abdomen Pelvis W Contrast  09/12/2013   CLINICAL DATA:  Inguinal mass.  Elevated LFTs.  EXAM: CT ABDOMEN AND PELVIS WITH CONTRAST  TECHNIQUE: Multidetector CT imaging of the abdomen and pelvis was performed using the standard protocol following bolus administration of intravenous contrast.  CONTRAST:  100 cc Omnipaque 300 IV.  COMPARISON:  None.  FINDINGS: Linear areas of atelectasis in the lower lobes bilaterally. Dense coronary artery calcifications. Heart is normal size. No effusions.  Gallbladder is contracted. Liver, spleen, adrenals, kidneys are unremarkable.  Calcifications scattered throughout the pancreas compatible with chronic pancreatitis. Small cystic lesion at the tip of the pancreatic tail measuring up to 2.0 cm. No pancreatic or biliary ductal dilatation.  Appendix is visualized and is normal. Right colonic and sigmoid diverticulosis. No active diverticulitis.  Bilateral inguinal hernias are present. The right inguinal hernia contains distal small bowel. The left inguinal hernia contains sigmoid colon. No evidence of bowel obstruction.  Urinary bladder is decompressed with Foley catheter in place. No free fluid, free air or adenopathy. Moderate stool throughout the colon, particularly rectosigmoid colon.  Aorta and iliac vessels are calcified, non aneurysmal. No free fluid, free air or adenopathy.  No acute bony abnormality or focal bone lesion. Degenerative changes in the lumbar spine.  IMPRESSION: Large bilateral inguinal hernias. The right inguinal hernia contains distal small bowel. The left inguinal hernia contains sigmoid colon. No  evidence of bowel obstruction.  Moderate stool burden throughout the colon. Right colonic and sigmoid diverticulosis.  Changes of chronic pancreatitis. 2 cm cystic lesion within the pancreatic tail may represent a small pseudocyst given the chronic pancreatitis changes, but is indeterminate. This could be further characterized with MRI with and without contrast if clinically desirable, or followed with repeat CT in 6 months.  Coronary artery disease.   Electronically Signed   By: Kevin  Dover M.D.   On: 09/12/2013 11:34   Results for orders placed during the hospital encounter of 09/11/13 (from the past 72 hour(s))  GLUCOSE, CAPILLARY     Status: Abnormal   Collection Time    09/11/13  4:30 PM      Result Value Ref Range   Glucose-Capillary 231 (*) 70 - 99 mg/dL  GLUCOSE, CAPILLARY     Status: Abnormal   Collection Time    09/11/13  9:26 PM      Result Value Ref Range   Glucose-Capillary 202 (*) 70 - 99 mg/dL  CBC WITH DIFFERENTIAL     Status: Abnormal   Collection Time    09/12/13  1:20 AM      Result Value Ref Range   WBC 12.2 (*) 4.0 - 10.5 K/uL   RBC 3.08 (*) 4.22 - 5.81 MIL/uL   Hemoglobin 9.5 (*) 13.0 - 17.0 g/dL   HCT 29.3 (*) 39.0 - 52.0 %   MCV 95.1  78.0 - 100.0 fL   MCH 30.8  26.0 - 34.0 pg   MCHC 32.4  30.0 - 36.0 g/dL   RDW 15.5  11.5 - 15.5 %   Platelets 221  150 - 400 K/uL   Neutrophils Relative % 71  43 - 77 %   Neutro Abs   8.7 (*) 1.7 - 7.7 K/uL   Lymphocytes Relative 23  12 - 46 %   Lymphs Abs 2.8  0.7 - 4.0 K/uL   Monocytes Relative 4  3 - 12 %   Monocytes Absolute 0.5  0.1 - 1.0 K/uL   Eosinophils Relative 2  0 - 5 %   Eosinophils Absolute 0.2  0.0 - 0.7 K/uL   Basophils Relative 0  0 - 1 %   Basophils Absolute 0.0  0.0 - 0.1 K/uL  COMPREHENSIVE METABOLIC PANEL     Status: Abnormal   Collection Time    09/12/13  1:20 AM      Result Value Ref Range   Sodium 144  137 - 147 mEq/L   Potassium 4.2  3.7 - 5.3 mEq/L   Chloride 109  96 - 112 mEq/L   CO2 23  19 -  32 mEq/L   Glucose, Bld 207 (*) 70 - 99 mg/dL   BUN 22  6 - 23 mg/dL   Creatinine, Ser 0.99  0.50 - 1.35 mg/dL   Calcium 8.8  8.4 - 10.5 mg/dL   Total Protein 7.2  6.0 - 8.3 g/dL   Albumin 1.8 (*) 3.5 - 5.2 g/dL   AST 142 (*) 0 - 37 U/L   ALT 109 (*) 0 - 53 U/L   Alkaline Phosphatase 1091 (*) 39 - 117 U/L   Total Bilirubin 0.7  0.3 - 1.2 mg/dL   GFR calc non Af Amer 77 (*) >90 mL/min   GFR calc Af Amer 89 (*) >90 mL/min   Comment: (NOTE)     The eGFR has been calculated using the CKD EPI equation.     This calculation has not been validated in all clinical situations.     eGFR's persistently <90 mL/min signify possible Chronic Kidney     Disease.  GLUCOSE, CAPILLARY     Status: Abnormal   Collection Time    09/12/13  7:35 AM      Result Value Ref Range   Glucose-Capillary 142 (*) 70 - 99 mg/dL  GAMMA GT     Status: Abnormal   Collection Time    09/12/13 10:25 AM      Result Value Ref Range   GGT 702 (*) 7 - 51 U/L  GLUCOSE, CAPILLARY     Status: Abnormal   Collection Time    09/12/13 12:14 PM      Result Value Ref Range   Glucose-Capillary 164 (*) 70 - 99 mg/dL  AMYLASE     Status: None   Collection Time    09/12/13  1:00 PM      Result Value Ref Range   Amylase 65  0 - 105 U/L  LIPASE, BLOOD     Status: Abnormal   Collection Time    09/12/13  1:00 PM      Result Value Ref Range   Lipase 126 (*) 11 - 59 U/L  GLUCOSE, CAPILLARY     Status: Abnormal   Collection Time    09/12/13  3:19 PM      Result Value Ref Range   Glucose-Capillary 358 (*) 70 - 99 mg/dL  GLUCOSE, CAPILLARY     Status: Abnormal   Collection Time    09/12/13  5:31 PM      Result Value Ref Range   Glucose-Capillary 298 (*) 70 - 99 mg/dL   Comment 1 Notify RN    GLUCOSE, CAPILLARY     Status: Abnormal     Collection Time    09/12/13  8:40 PM      Result Value Ref Range   Glucose-Capillary 336 (*) 70 - 99 mg/dL  GLUCOSE, CAPILLARY     Status: Abnormal   Collection Time    09/13/13  7:15 AM       Result Value Ref Range   Glucose-Capillary 214 (*) 70 - 99 mg/dL   Comment 1 Notify RN    GLUCOSE, CAPILLARY     Status: Abnormal   Collection Time    09/13/13 11:39 AM      Result Value Ref Range   Glucose-Capillary 264 (*) 70 - 99 mg/dL   Comment 1 Notify RN    GLUCOSE, CAPILLARY     Status: Abnormal   Collection Time    09/13/13  4:22 PM      Result Value Ref Range   Glucose-Capillary 443 (*) 70 - 99 mg/dL   Comment 1 Notify RN    GLUCOSE, CAPILLARY     Status: Abnormal   Collection Time    09/13/13  9:29 PM      Result Value Ref Range   Glucose-Capillary 329 (*) 70 - 99 mg/dL  COMPREHENSIVE METABOLIC PANEL     Status: Abnormal   Collection Time    09/14/13  5:35 AM      Result Value Ref Range   Sodium 153 (*) 137 - 147 mEq/L   Potassium 3.9  3.7 - 5.3 mEq/L   Chloride 117 (*) 96 - 112 mEq/L   CO2 23  19 - 32 mEq/L   Glucose, Bld 179 (*) 70 - 99 mg/dL   BUN 30 (*) 6 - 23 mg/dL   Creatinine, Ser 1.00  0.50 - 1.35 mg/dL   Calcium 9.1  8.4 - 10.5 mg/dL   Total Protein 7.7  6.0 - 8.3 g/dL   Albumin 1.8 (*) 3.5 - 5.2 g/dL   AST 313 (*) 0 - 37 U/L   ALT 200 (*) 0 - 53 U/L   Alkaline Phosphatase 1587 (*) 39 - 117 U/L   Total Bilirubin 0.5  0.3 - 1.2 mg/dL   GFR calc non Af Amer 70 (*) >90 mL/min   GFR calc Af Amer 82 (*) >90 mL/min   Comment: (NOTE)     The eGFR has been calculated using the CKD EPI equation.     This calculation has not been validated in all clinical situations.     eGFR's persistently <90 mL/min signify possible Chronic Kidney     Disease.  LIPASE, BLOOD     Status: Abnormal   Collection Time    09/14/13  5:35 AM      Result Value Ref Range   Lipase 116 (*) 11 - 59 U/L  GLUCOSE, CAPILLARY     Status: Abnormal   Collection Time    09/14/13  7:21 AM      Result Value Ref Range   Glucose-Capillary 141 (*) 70 - 99 mg/dL   Comment 1 Notify RN       HEENT: normal Cardio: irregular and no murmurs Resp: CTA B/L and unlabored GI: BS positive and  large bilateral inguinal mass, non reducable mildly tender Extremity:  No Edema Skin:   Breakdown sacral erythema, eschar over L gluteal area, erythematous satellite lesions in peri area Neuro: Confused, Abnormal Sensory cannot assess secondary to mental status, Abnormal Motor 4/5 in BUE, 3/5 in BLE, Abnormal FMC Ataxic/ dec FMC, Dysarthric and Apraxic Musc/Skel:  Normal Gen NAD  Assessment/Plan: 1. Functional deficits secondary to bilateral cerebral infarct embolic which require 3+ hours per day of interdisciplinary therapy in a comprehensive inpatient rehab setting. Physiatrist is providing close team supervision and 24 hour management of active medical problems listed below. Physiatrist and rehab team continue to assess barriers to discharge/monitor patient progress toward functional and medical goals.  FIM: FIM - Bathing Bathing Steps Patient Completed: Chest Bathing: 1: Total-Patient completes 0-2 of 10 parts or less than 25%  FIM - Upper Body Dressing/Undressing Upper body dressing/undressing: 0: Wears gown/pajamas-no public clothing FIM - Lower Body Dressing/Undressing Lower body dressing/undressing: 0: Wears gown/pajamas-no public clothing  FIM - Toileting Toileting: 0: Activity did not occur  FIM - Air cabin crew Transfers: 0-Activity did not occur  FIM - Control and instrumentation engineer Devices: Sliding board Bed/Chair Transfer: 1: Two helpers  FIM - Locomotion: Wheelchair Locomotion: Wheelchair: 1: Total Assistance/staff pushes wheelchair (Pt<25%) FIM - Locomotion: Ambulation Locomotion: Ambulation: 0: Activity did not occur  Comprehension Comprehension Mode: Auditory Comprehension: 2-Understands basic 25 - 49% of the time/requires cueing 51 - 75% of the time  Expression Expression Mode: Verbal Expression: 2-Expresses basic 25 - 49% of the time/requires cueing 50 - 75% of the time. Uses single words/gestures.  Social  Interaction Social Interaction: 2-Interacts appropriately 25 - 49% of time - Needs frequent redirection.  Problem Solving Problem Solving: 1-Solves basic less than 25% of the time - needs direction nearly all the time or does not effectively solve problems and may need a restraint for safety  Memory Memory: 1-Recognizes or recalls less than 25% of the time/requires cueing greater than 75% of the time  Medical Problem List and Plan:  1. Functional deficits secondary to acute cerebral white matter bilateral infarct  2. DVT Prophylaxis/Anticoagulation: Eliquis. Monitor for any bleeding episodes  3. Pain Management: Tylenol as needed  4. Dysphagia with decreased nutritional storage. Dysphagia 1 nectar liquids. Followup speech therapy monitor hydration. Consider appetite stimulant  5. Neuropsych: This patient is not capable of making decisions on his own behalf.  6. Hypertension/atrial fibrillation. Norvasc 10 mg daily, hydralazine 10 mg 4 times a day, metoprolol 25 mg every 8 hours. Monitor with increased mobility  7 ID/methicillin sensitive staph aureus. Continue cefazolin 2 g intravenously every 8 hours x4 weeks total initiated 08/29/2013. Monitor for any fever  8. Diabetes mellitus with peripheral neuropathy. Check CBGs a.c. and at bedtime. Levemir 20 units each bedtime.  9. Integumentary: local skin care, turning, maintain foley for now, mattress overlay, WOC, Diflucan for yeast 10.  Inguinal mass, bilateral hernia, obviously chronic and not incarcerated, given recent CVA, no elective surg recommended at this time 11.  Elevated alk phos, AST,ALT and lipase, stop Lipitor, ? Etiol, CT abd with small pancreatic cyst at tail ,  abd exam normal except hernia, GI eval LOS (Days) 3 A FACE TO FACE EVALUATION WAS PERFORMED  KIRSTEINS,ANDREW E 09/14/2013, 8:14 AM

## 2013-09-14 NOTE — Consult Note (Signed)
Unassigned Consult  Reason for Consult: Elevated AP/AST/ALT Referring Physician: Rehab.  Stormy CardJames A Normoyle HPI: This is a 80 year old male transferred from [facility] for total rehabilitative treatment.  He initially presented to [place] on 08/27/2013 with slurred speech and weakness.  Further evaluation revealed that he had bilateral cerebral white matter infarcts.  Since his admission, to [facility] on 09/11/2013, he was noted to have an increase in his AP in the 1000 range.  His statin was discontinued.  No prior blood work prior to this [facility] admission for comparison.  CT scan of the ABM reveals chronic calcific pancreatitis and a 2 cm tail of the pancreas cyst.  He also has two large bilateral inguinal hernias.  When he was at [facility] he developed issues with bleeding while he was taking Eliquis.  The bleeding occurred when a TEE was attempted, which resulted in a laceration.  No past medical history on file.  No past surgical history on file.  No family history on file.  Social History:  has no tobacco, alcohol, and drug history on file.  Allergies: No Known Allergies  Medications:  Scheduled: . amLODipine  10 mg Oral Daily  . antiseptic oral rinse  15 mL Mouth Rinse q12n4p  . apixaban  5 mg Oral BID  .  ceFAZolin (ANCEF) IV  2 g Intravenous 3 times per day  . chlorhexidine  15 mL Mouth Rinse BID  . feeding supplement (ENSURE)  1 Container Oral TID BM  . fluconazole  100 mg Oral Daily  . hydrALAZINE  10 mg Oral 4 times per day  . insulin aspart  0-15 Units Subcutaneous TID WC  . insulin detemir  25 Units Subcutaneous QHS  . metoprolol tartrate  25 mg Oral 3 times per day   Continuous:   Results for orders placed during the hospital encounter of 09/11/13 (from the past 24 hour(s))  GLUCOSE, CAPILLARY     Status: Abnormal   Collection Time    09/13/13 11:39 AM      Result Value Ref Range   Glucose-Capillary 264 (*) 70 - 99 mg/dL   Comment 1 Notify RN     GLUCOSE, CAPILLARY     Status: Abnormal   Collection Time    09/13/13  4:22 PM      Result Value Ref Range   Glucose-Capillary 443 (*) 70 - 99 mg/dL   Comment 1 Notify RN    GLUCOSE, CAPILLARY     Status: Abnormal   Collection Time    09/13/13  9:29 PM      Result Value Ref Range   Glucose-Capillary 329 (*) 70 - 99 mg/dL  COMPREHENSIVE METABOLIC PANEL     Status: Abnormal   Collection Time    09/14/13  5:35 AM      Result Value Ref Range   Sodium 153 (*) 137 - 147 mEq/L   Potassium 3.9  3.7 - 5.3 mEq/L   Chloride 117 (*) 96 - 112 mEq/L   CO2 23  19 - 32 mEq/L   Glucose, Bld 179 (*) 70 - 99 mg/dL   BUN 30 (*) 6 - 23 mg/dL   Creatinine, Ser 1.611.00  0.50 - 1.35 mg/dL   Calcium 9.1  8.4 - 09.610.5 mg/dL   Total Protein 7.7  6.0 - 8.3 g/dL   Albumin 1.8 (*) 3.5 - 5.2 g/dL   AST 045313 (*) 0 - 37 U/L   ALT 200 (*) 0 - 53 U/L  Alkaline Phosphatase 1587 (*) 39 - 117 U/L   Total Bilirubin 0.5  0.3 - 1.2 mg/dL   GFR calc non Af Amer 70 (*) >90 mL/min   GFR calc Af Amer 82 (*) >90 mL/min  LIPASE, BLOOD     Status: Abnormal   Collection Time    09/14/13  5:35 AM      Result Value Ref Range   Lipase 116 (*) 11 - 59 U/L  GLUCOSE, CAPILLARY     Status: Abnormal   Collection Time    09/14/13  7:21 AM      Result Value Ref Range   Glucose-Capillary 141 (*) 70 - 99 mg/dL   Comment 1 Notify RN       Ct Abdomen Pelvis W Contrast  09/12/2013   CLINICAL DATA:  Inguinal mass.  Elevated LFTs.  EXAM: CT ABDOMEN AND PELVIS WITH CONTRAST  TECHNIQUE: Multidetector CT imaging of the abdomen and pelvis was performed using the standard protocol following bolus administration of intravenous contrast.  CONTRAST:  100 cc Omnipaque 300 IV.  COMPARISON:  None.  FINDINGS: Linear areas of atelectasis in the lower lobes bilaterally. Dense coronary artery calcifications. Heart is normal size. No effusions.  Gallbladder is contracted. Liver, spleen, adrenals, kidneys are unremarkable.  Calcifications scattered  throughout the pancreas compatible with chronic pancreatitis. Small cystic lesion at the tip of the pancreatic tail measuring up to 2.0 cm. No pancreatic or biliary ductal dilatation.  Appendix is visualized and is normal. Right colonic and sigmoid diverticulosis. No active diverticulitis.  Bilateral inguinal hernias are present. The right inguinal hernia contains distal small bowel. The left inguinal hernia contains sigmoid colon. No evidence of bowel obstruction.  Urinary bladder is decompressed with Foley catheter in place. No free fluid, free air or adenopathy. Moderate stool throughout the colon, particularly rectosigmoid colon.  Aorta and iliac vessels are calcified, non aneurysmal. No free fluid, free air or adenopathy.  No acute bony abnormality or focal bone lesion. Degenerative changes in the lumbar spine.  IMPRESSION: Large bilateral inguinal hernias. The right inguinal hernia contains distal small bowel. The left inguinal hernia contains sigmoid colon. No evidence of bowel obstruction.  Moderate stool burden throughout the colon. Right colonic and sigmoid diverticulosis.  Changes of chronic pancreatitis. 2 cm cystic lesion within the pancreatic tail may represent a small pseudocyst given the chronic pancreatitis changes, but is indeterminate. This could be further characterized with MRI with and without contrast if clinically desirable, or followed with repeat CT in 6 months.  Coronary artery disease.   Electronically Signed   By: Charlett Nose M.D.   On: 09/12/2013 11:34    ROS:  As stated above in the HPI otherwise negative.  Blood pressure 131/79, pulse 91, temperature 98.3 F (36.8 C), temperature source Oral, resp. rate 18, weight 188 lb 11.2 oz (85.594 kg), SpO2 95.00%.    PE: Gen: Somnolent Lungs: CTA Bilaterally CV: Irreg Irreg ABM: Soft, NTND, +BS Ext: No C/C/E  Assessment/Plan: 1) Elevated AP. 2) Elevated AST/ALT. 3) S/p bilateral embolic strokes. 4) Afib. 5) Chronic calcific  pancreatitis. 6) Pancreatic tail cyst - ? Pseudocyst.   His AP elevation is very high and he has also increased AST/ALT, but no increase in his TB.  With the high elevation in his AP I suspect this is from a drug reaction.  His GGT was also increased.  I was not able to obtain any baseline level of his transaminases from his work up at [facility].  In my review, I have a suspicion that this may be secondary to Eliquis, but this is a very rare occurrence.  It will also be reasonable to check other etiologies with the degree of his AP elevation.  Autoimmune etiologies can cause a very high elevation in transaminases.  Other infiltrative considerations would be sarcoidosis, amyloidosis, and metastatic diseases, but I think these are far less likely.  Plan: 1) Check HBV, HCV, AMA, ANA, ASMA, alpha-1-antitypsin, iron panel, and ferritin. 2) Hold Eliquis and switch to another anticoagulant. 3) Follow liver panel. 4) Obtain blood work from [facility].  HUNG,PATRICK D 09/14/2013, 10:10 AM

## 2013-09-15 ENCOUNTER — Inpatient Hospital Stay (HOSPITAL_COMMUNITY): Payer: Medicare Other | Admitting: Speech Pathology

## 2013-09-15 ENCOUNTER — Inpatient Hospital Stay (HOSPITAL_COMMUNITY): Payer: Medicare Other | Admitting: Occupational Therapy

## 2013-09-15 ENCOUNTER — Inpatient Hospital Stay (HOSPITAL_COMMUNITY): Payer: Medicare Other | Admitting: Physical Therapy

## 2013-09-15 DIAGNOSIS — I6322 Cerebral infarction due to unspecified occlusion or stenosis of basilar arteries: Secondary | ICD-10-CM

## 2013-09-15 LAB — GLUCOSE, CAPILLARY
Glucose-Capillary: 226 mg/dL — ABNORMAL HIGH (ref 70–99)
Glucose-Capillary: 230 mg/dL — ABNORMAL HIGH (ref 70–99)
Glucose-Capillary: 237 mg/dL — ABNORMAL HIGH (ref 70–99)
Glucose-Capillary: 141 mg/dL — ABNORMAL HIGH (ref 70–99)

## 2013-09-15 LAB — COMPREHENSIVE METABOLIC PANEL
ALT: 444 U/L — ABNORMAL HIGH (ref 0–53)
AST: 802 U/L — ABNORMAL HIGH (ref 0–37)
Albumin: 1.9 g/dL — ABNORMAL LOW (ref 3.5–5.2)
Alkaline Phosphatase: 1805 U/L — ABNORMAL HIGH (ref 39–117)
BUN: 33 mg/dL — ABNORMAL HIGH (ref 6–23)
CO2: 23 mEq/L (ref 19–32)
Calcium: 8.9 mg/dL (ref 8.4–10.5)
Chloride: 121 mEq/L — ABNORMAL HIGH (ref 96–112)
Creatinine, Ser: 1.12 mg/dL (ref 0.50–1.35)
GFR calc Af Amer: 71 mL/min — ABNORMAL LOW (ref >90)
GFR calc non Af Amer: 61 mL/min — ABNORMAL LOW (ref >90)
Glucose, Bld: 281 mg/dL — ABNORMAL HIGH (ref 70–99)
Potassium: 4 mEq/L (ref 3.7–5.3)
Sodium: 157 mEq/L — ABNORMAL HIGH (ref 137–147)
Total Bilirubin: 0.6 mg/dL (ref 0.3–1.2)
Total Protein: 7.9 g/dL (ref 6.0–8.3)

## 2013-09-15 LAB — IGG, IGA, IGM
IgA: 1040 mg/dL — ABNORMAL HIGH (ref 68–379)
IgG (Immunoglobin G), Serum: 2290 mg/dL — ABNORMAL HIGH (ref 650–1600)
IgM, Serum: 56 mg/dL — ABNORMAL LOW (ref 41–251)

## 2013-09-15 MED ORDER — SODIUM CHLORIDE 0.45 % IV SOLN
INTRAVENOUS | Status: DC
  Administered 2013-09-15 (×2): via INTRAVENOUS

## 2013-09-15 MED ORDER — ENOXAPARIN SODIUM 100 MG/ML ~~LOC~~ SOLN
85.0000 mg | Freq: Two times a day (BID) | SUBCUTANEOUS | Status: DC
Start: 2013-09-15 — End: 2013-09-20
  Administered 2013-09-15 – 2013-09-20 (×8): 85 mg via SUBCUTANEOUS
  Filled 2013-09-15 (×14): qty 1

## 2013-09-15 MED ORDER — ALTEPLASE 2 MG IJ SOLR
2.0000 mg | Freq: Once | INTRAMUSCULAR | Status: AC
  Administered 2013-09-15: 2 mg
  Filled 2013-09-15: qty 2

## 2013-09-15 NOTE — Progress Notes (Signed)
[name], male   DOB: 07/13/1933, 80 y.o.   MRN: [id number]  09/15/13.  Subjective/Complaints: 80 y/o admit for CIR with functional deficits secondary to bilateral cerebral infarct embolic No abd pain, no nausea or vomiting No nausea or vomiting Confused oriented to self (unchanged vs admit)  Review of Systems - limited secondary to mental status  Objective: Vital Signs: Blood pressure 133/83, pulse 86, temperature 97.7 F (36.5 C), temperature source Oral, resp. rate 19, weight 188 lb 11.2 oz (85.594 kg), SpO2 96.00%. No results found. Results for orders placed during the hospital encounter of 09/11/13 (from the past 72 hour(s))  GAMMA GT     Status: Abnormal   Collection Time    09/12/13 10:25 AM      Result Value Ref Range   GGT 702 (*) 7 - 51 U/L  GLUCOSE, CAPILLARY     Status: Abnormal   Collection Time    09/12/13 12:14 PM      Result Value Ref Range   Glucose-Capillary 164 (*) 70 - 99 mg/dL  AMYLASE     Status: None   Collection Time    09/12/13  1:00 PM      Result Value Ref Range   Amylase 65  0 - 105 U/L  LIPASE, BLOOD     Status: Abnormal   Collection Time    09/12/13  1:00 PM      Result Value Ref Range   Lipase 126 (*) 11 - 59 U/L  GLUCOSE, CAPILLARY     Status: Abnormal   Collection Time    09/12/13  3:19 PM      Result Value Ref Range   Glucose-Capillary 358 (*) 70 - 99 mg/dL  GLUCOSE, CAPILLARY     Status: Abnormal   Collection Time    09/12/13  5:31 PM      Result Value Ref Range   Glucose-Capillary 298 (*) 70 - 99 mg/dL   Comment 1 Notify RN    GLUCOSE, CAPILLARY     Status: Abnormal   Collection Time    09/12/13  8:40 PM      Result Value Ref Range   Glucose-Capillary 336 (*) 70 - 99 mg/dL  GLUCOSE, CAPILLARY     Status: Abnormal   Collection Time    09/13/13  7:15 AM      Result Value Ref Range   Glucose-Capillary 214 (*) 70 - 99 mg/dL   Comment 1 Notify RN    GLUCOSE, CAPILLARY     Status: Abnormal   Collection Time     09/13/13 11:39 AM      Result Value Ref Range   Glucose-Capillary 264 (*) 70 - 99 mg/dL   Comment 1 Notify RN    GLUCOSE, CAPILLARY     Status: Abnormal   Collection Time    09/13/13  4:22 PM      Result Value Ref Range   Glucose-Capillary 443 (*) 70 - 99 mg/dL   Comment 1 Notify RN    GLUCOSE, CAPILLARY     Status: Abnormal   Collection Time    09/13/13  9:29 PM      Result Value Ref Range   Glucose-Capillary 329 (*) 70 - 99 mg/dL  COMPREHENSIVE METABOLIC PANEL     Status: Abnormal   Collection Time    09/14/13  5:35 AM      Result Value Ref Range   Sodium 153 (*) 137 - 147 mEq/L   Potassium 3.9  3.7 - 5.3 mEq/L   Chloride 117 (*) 96 - 112 mEq/L   CO2 23  19 - 32 mEq/L   Glucose, Bld 179 (*) 70 - 99 mg/dL   BUN 30 (*) 6 - 23 mg/dL   Creatinine, Ser 1.00  0.50 - 1.35 mg/dL   Calcium 9.1  8.4 - 10.5 mg/dL   Total Protein 7.7  6.0 - 8.3 g/dL   Albumin 1.8 (*) 3.5 - 5.2 g/dL   AST 313 (*) 0 - 37 U/L   ALT 200 (*) 0 - 53 U/L   Alkaline Phosphatase 1587 (*) 39 - 117 U/L   Total Bilirubin 0.5  0.3 - 1.2 mg/dL   GFR calc non Af Amer 70 (*) >90 mL/min   GFR calc Af Amer 82 (*) >90 mL/min   Comment: (NOTE)     The eGFR has been calculated using the CKD EPI equation.     This calculation has not been validated in all clinical situations.     eGFR's persistently <90 mL/min signify possible Chronic Kidney     Disease.  LIPASE, BLOOD     Status: Abnormal   Collection Time    09/14/13  5:35 AM      Result Value Ref Range   Lipase 116 (*) 11 - 59 U/L  GLUCOSE, CAPILLARY     Status: Abnormal   Collection Time    09/14/13  7:21 AM      Result Value Ref Range   Glucose-Capillary 141 (*) 70 - 99 mg/dL   Comment 1 Notify RN    GLUCOSE, CAPILLARY     Status: Abnormal   Collection Time    09/14/13 12:14 PM      Result Value Ref Range   Glucose-Capillary 304 (*) 70 - 99 mg/dL   Comment 1 Notify RN    HEPATITIS B SURFACE ANTIBODY     Status: None   Collection Time    09/14/13   1:20 PM      Result Value Ref Range   Hep B S Ab NEGATIVE  NEGATIVE   Comment: Performed at [place]     Status: None   Collection Time    09/14/13  1:20 PM      Result Value Ref Range   Hepatitis B Surface Ag NEGATIVE  NEGATIVE   Comment: Performed at [place] ANTIBODY     Status: None   Collection Time    09/14/13  1:20 PM      Result Value Ref Range   HCV Ab NEGATIVE  NEGATIVE   Comment: Performed at [place], IGA, IGM     Status: Abnormal   Collection Time    09/14/13  1:20 PM      Result Value Ref Range   IgG (Immunoglobin G), Serum 2290 (*) 650 - 1600 mg/dL   IgA 1040 (*) 68 - 379 mg/dL   IgM, Serum 56 (*) 41 - 251 mg/dL   Comment: Performed at [place]     Status: Abnormal   Collection Time    09/14/13  1:20 PM      Result Value Ref Range   Ferritin 1492 (*) 22 - 322 ng/mL   Comment: Result repeated and verified.     Performed at [place] TIBC     Status: Abnormal   Collection Time    09/14/13  1:20  PM      Result Value Ref Range   Iron 30 (*) 42 - 135 ug/dL   TIBC 182 (*) 215 - 435 ug/dL   Saturation Ratios 16 (*) 20 - 55 %   UIBC 152  125 - 400 ug/dL   Comment: Performed at [place], CAPILLARY     Status: Abnormal   Collection Time    09/14/13  4:56 PM      Result Value Ref Range   Glucose-Capillary 280 (*) 70 - 99 mg/dL  GLUCOSE, CAPILLARY     Status: Abnormal   Collection Time    09/14/13  9:03 PM      Result Value Ref Range   Glucose-Capillary 378 (*) 70 - 99 mg/dL  GLUCOSE, CAPILLARY     Status: Abnormal   Collection Time    09/14/13 11:58 PM      Result Value Ref Range   Glucose-Capillary 377 (*) 70 - 99 mg/dL  COMPREHENSIVE METABOLIC PANEL     Status: Abnormal   Collection Time    09/15/13  4:49 AM      Result Value Ref Range   Sodium 157 (*) 137 - 147 mEq/L   Potassium 4.0  3.7 - 5.3 mEq/L    Chloride 121 (*) 96 - 112 mEq/L   CO2 23  19 - 32 mEq/L   Glucose, Bld 281 (*) 70 - 99 mg/dL   BUN 33 (*) 6 - 23 mg/dL   Creatinine, Ser 1.12  0.50 - 1.35 mg/dL   Calcium 8.9  8.4 - 10.5 mg/dL   Total Protein 7.9  6.0 - 8.3 g/dL   Albumin 1.9 (*) 3.5 - 5.2 g/dL   AST 802 (*) 0 - 37 U/L   ALT 444 (*) 0 - 53 U/L   Alkaline Phosphatase 1805 (*) 39 - 117 U/L   Total Bilirubin 0.6  0.3 - 1.2 mg/dL   GFR calc non Af Amer 61 (*) >90 mL/min   GFR calc Af Amer 71 (*) >90 mL/min   Comment: (NOTE)     The eGFR has been calculated using the CKD EPI equation.     This calculation has not been validated in all clinical situations.     eGFR's persistently <90 mL/min signify possible Chronic Kidney     Disease.  GLUCOSE, CAPILLARY     Status: Abnormal   Collection Time    09/15/13  7:16 AM      Result Value Ref Range   Glucose-Capillary 226 (*) 70 - 99 mg/dL   Comment 1 Notify RN       Intake/Output Summary (Last 24 hours) at 09/15/13 0844 Last data filed at 09/15/13 0508  Gross per 24 hour  Intake    600 ml  Output   1500 ml  Net   -900 ml    Patient Vitals for the past 24 hrs:  BP Temp Temp src Pulse Resp SpO2  09/15/13 0508 133/83 mmHg 97.7 F (36.5 C) Oral 86 19 96 %  09/14/13 2100 125/60 mmHg 99.2 F (37.3 C) Oral 91 18 99 %  09/14/13 1700 135/75 mmHg 98.5 F (36.9 C) Oral 86 17 95 %  09/14/13 1626 129/71 mmHg - - 97 - -  09/14/13 1230 137/72 mmHg - - - - -   CBG (last 3)   Recent Labs  09/14/13 2103 09/14/13 2358 09/15/13 0716  GLUCAP 378* 377* 226*    HEENT: normal Cardio: irregular and no murmurs Resp:  CTA B/L and unlabored GI: BS positive and large bilateral inguinal mass, non reducable mildly tender Extremity:  No Edema Skin:    PICC line R armNeuro: Confused, Abnormal Sensory cannot assess secondary to mental status, Abnormal Motor 4/5 in BUE, 3/5 in BLE, Abnormal FMC Ataxic/ dec FMC, Dysarthric and Apraxic Musc/Skel:  Normal Gen NAD GU- foley  cath   Assessment/Plan:  Medical Problem List and Plan:  1. Functional deficits secondary to acute cerebral white matter bilateral infarct  2. DVT Prophylaxis/Anticoagulation: Eliquis. Monitor for any bleeding episodes  3. Pain Management: Tylenol as needed  4. Dysphagia with decreased nutritional storage. Dysphagia 1 nectar liquids. Followup speech therapy monitor hydration. Consider appetite stimulant  5. Neuropsych: This patient is not capable of making decisions on his own behalf.  6. Hypertension/atrial fibrillation. Norvasc 10 mg daily, hydralazine 10 mg 4 times a day, metoprolol 25 mg every 8 hours. Monitor with increased mobility  7 ID/methicillin sensitive staph aureus. Continue cefazolin 2 g intravenously every 8 hours x4 weeks total initiated 08/29/2013. Monitor for any fever  8. Diabetes mellitus with peripheral neuropathy. Check CBGs a.c. and at bedtime. Levemir 20 units each bedtime.  Continue SSI coverage 9. Integumentary: local skin care, turning, maintain foley for now, mattress overlay, WOC, Diflucan for yeast 10.  Inguinal mass, bilateral hernia, obviously chronic and not incarcerated, given recent CVA, no elective surg recommended at this time 11.  Elevated alk phos, AST,ALT and lipase, stop Lipitor, ? Etiol, CT abd with small pancreatic cyst at tail ,  abd exam normal except hernia, GI eval 12. Worsening hypernatremia with poor po intake;  Will supplement with IVF today and check lab in am  LOS (Days) 4 A FACE TO FACE EVALUATION WAS PERFORMED  Nyoka Cowden 09/15/2013, 8:40 AM

## 2013-09-15 NOTE — Progress Notes (Signed)
Occupational Therapy Session Note  Patient Details  Name: Elijah [name] MRN: [id number] Date of Birth: 09/06/1933  Today's Date: 09/15/2013 Time: 0930-1040 Time Calculation (min): 70 min  Short Term Goals: Week 1:  OT Short Term Goal 1 (Week 1): Pt will be able to sit EOB with mod A x 1 for 10 min to engage in UB bathing and dressing. OT Short Term Goal 2 (Week 1): Pt will be able to bathe his UB with min A. OT Short Term Goal 3 (Week 1): Pt will be able to don a tshirt with mod A. OT Short Term Goal 4 (Week 1): Pt will be able to transfer to w/c from bed with max A x1. OT Short Term Goal 5 (Week 1): Pt will demonstrate improved direction following during simple grooming with min cues.  Skilled Therapeutic Interventions/Progress Updates: Patient scheduled for ADL and was "in and out" with lethargy-mostly out except he was attentive and focused enough to complete stand pivot Max A x 2 transfers twice this morning.  Patient visual perceptual (stated he saw 4 of this particular clinician & reported he has depth perception issues and left inattention)  and cognitive (signitificant "across the board" during this session) status inhibiting to his ability to attend, stay awake, and initiate tasks.   Patient with difficulty attending and planning self care tasks and required extra time to process and participate when he did.   His greatest accomplishments during this session were stand pivot transfers with Max A X 2 (once bed to w/c and then w/c to better w/c).   Patient was handed off to Phys Therapy team members following the OT session.      Therapy Documentation Precautions:  Precautions Precautions: Fall Precaution Comments: pt resists movement Restrictions Weight Bearing Restrictions: No Pain:  "hurting in my rear" but not able to rate.  Patient with rash and reddened skin but did not prefer pain meds,   Positioned in chair with pressure off bottom as much as possible   ADL ADL  Comments: Total Ax2 overall See FIM for current functional status  Therapy/Group: Individual Therapy  Bud Faceickett, [facility] 09/15/2013, 12:21 PM

## 2013-09-15 NOTE — Progress Notes (Signed)
ANTICOAGULATION CONSULT NOTE - Initial Consult  Pharmacy Consult for Lovenox Indication: atrial fibrillation  No Known Allergies  Patient Measurements: Weight: 188 lb 11.2 oz (85.594 kg) Heparin Dosing Weight:   Vital Signs: Temp: 97.7 F (36.5 C) (06/27 0508) Temp src: Oral (06/27 0508) BP: 133/83 mmHg (06/27 0508) Pulse Rate: 86 (06/27 0508)  Labs:  Recent Labs  09/14/13 0535 09/15/13 0449  CREATININE 1.00 1.12    The CrCl is unknown because both a height and weight (above a minimum accepted value) are required for this calculation.   Medical History: No past medical history on file.   Assessment: [age] YOM [facility] from [facility] CIR on 6/23, after being admitted for stroke on 6/8.   Anticoag: He was started on Eliquis on 6/15 [facility]. Wt = 90 kg, 5mg  BID dosage appropriate.Had bleeding ar [facility] with TEE. See GI note and assessment 6/26.  GI: LFTs extremely elevated and worsening-- lipitor d/c'd, CT abd chronic calcific pancreatitis and a 2 cm tail of the pancreas cyst. GI recommended Hold Eliquis and switch to another anticoagulant for possibly immune reaction. I paged and discussed GI recommendations with Dr. Amador CunasKwiatkowski. Coumadin not an option with severely elevated LFT's right now, but maybe when they correct.  ID: [facility] - on ancef x 4 weeks from 6/10, afebrile  Plan:  1. D/c Eliquis per GI recommendations 6/26. 2. Start Lovenox 85mg  SQ BID tonight when next dose of Eliquis would normally be due. 3. CBC q72h while on Lovenox. 4. Awaiting other autoimmune lab results.   Crystal S. Merilynn Finlandobertson, PharmD, BCPS Clinical Staff Pharmacist Pager [phone]  Misty Stanleyobertson, Crystal Stillinger 09/15/2013,12:01 PM

## 2013-09-15 NOTE — Progress Notes (Signed)
Physical Therapy Session Note  Patient Details  Name: Elijah [name] A Jaycox MRN: [id number] Date of Birth: 02/15/1934  Today's Date: 09/15/2013 Time: [id number] Time Calculation (min): 60 min  Short Term Goals: Week 1:  PT Short Term Goal 1 (Week 1): pt will roll L with min assist PT Short Term Goal 2 (Week 1): pt will transfer bed >< w/c with assist of 1 person PT Short Term Goal 3 (Week 1): pt will tolerate standing x 5 minutes  Skilled Therapeutic Interventions/Progress Updates:    Pt amenable to treatment with encouragement. Pt continues to be limited by arousal, resistance to movement, and poor postural control. Pt benefits from cues for resisted trunk flexion before facilitation into anterior weight shift. Pt demonstrates improved ability to perform standing activities using Carley HammedEva walker. Pt would continue to benefit from skilled PT services to increase functional mobility.  Therapy Documentation Precautions:  Precautions Precautions: Fall Precaution Comments: pt resists movement Restrictions Weight Bearing Restrictions: No Pain:  None reported Mobility:  Pt performs transfers with D assist or Max A x2 with cues for weight shift, technique, attention, core activation, and posture Locomotion : Unsafe to attempt    Trunk/Postural Assessment :    Balance:  sitting balance unsupported in chair fluctuates from close supervision to Min A. Pt Min A for all anterior reaching activities. Exercises:   Other Treatments:  Pt performs standing with Carley HammedEva walker 3' x2 with cues for posture. Pt initially Mod A x2 to perform, but decreased to Min A x2 and Mod Ax1 at points. Pt requires frequent rest breaks including cog rest, redirection, insight training, and passive orientation. Pt performs anterior weight shift followed by resisted trunk flexion 4x5. Pt performs partial sit to stands 2x5 with cues for weight shift. Pt performs scooting with D assist and cues for technique. Positioning with vertical  towel roll performed. B/L Pec stretch with myofascial release performed, HS stretch with contract relax, and gastroc stretch with talocrural mobs performed. Pt left sitting at RN station with needs and quick release donned.  See FIM for current functional status  Therapy/Group: Individual Therapy  Christia ReadingKinney, Steven G 09/15/2013, 4:20 PM

## 2013-09-15 NOTE — Progress Notes (Signed)
Physical Therapy Session Note  Patient Details  Name: Elijah [name] MRN: [id number] Date of Birth: 01/23/1934  Today's Date: 09/15/2013 Time: [id number] Time Calculation (min): 45 min  Short Term Goals: Week 1:  PT Short Term Goal 1 (Week 1): pt will roll L with min assist PT Short Term Goal 2 (Week 1): pt will transfer bed >< w/c with assist of 1 person PT Short Term Goal 3 (Week 1): pt will tolerate standing x 5 minutes  Skilled Therapeutic Interventions/Progress Updates:    Pt with limited arousal at points in session, but improves when in unsupported sitting. Pt limited by decreased attention, insight, and safety awareness requiring rest and redirection. Pt with inconsistent performance in transfers, likely due to aforementioned cognitive deficits. Pt with some R lateral pushing noted that increases when fatigued. Pt would continue to benefit from skilled PT services to increase functional mobility  Therapy Documentation Precautions:  Precautions Precautions: Fall Precaution Comments: pt resists movement Restrictions Weight Bearing Restrictions: No Pain: Pain Assessment Pain Assessment: No/denies pain Mobility:  Pt performs transfers with Max A x1 from surface to surfaces with cues for weight shift, sequencing, midline, and hand placement.  Pt performs sit to stands x5 with and without RW Max A x2 with cues for weight shift, hand placement, AD, and midline. Pt performs reaching to min and mod excursion spontaneously and occasionally on command. Balance:   Pt performs sitting balance Min A and at times mod assist EOB with cues for midline and weight shift. LUE weight bearing through elbow performed 2x30". Other Treatments:   Pt requires frequent cognitive rest and redirection in session. L/S soft tissue stretch performed for L lateral lumbar flexion. Pt passively oriented and insght training performed.  See FIM for current functional status  Therapy/Group: Individual  Therapy  Christia ReadingKinney, Steven G 09/15/2013, 12:50 PM

## 2013-09-15 NOTE — Progress Notes (Signed)
Speech Language Pathology Daily Session Note  Patient Details  Name: Elijah CardJames A Prestwood MRN: [id number] Date of Birth: 07/14/1933  Today's Date: 09/15/2013 Time: 0900-0930 Time Calculation (min): 30 min  Short Term Goals: Week 1: SLP Short Term Goal 1 (Week 1): Pt will utilize recommended strategies with recommended diet with Mod cues SLP Short Term Goal 2 (Week 1): Pt will utilize speech intelligibility strategies at the phrase level with Mod cues SLP Short Term Goal 3 (Week 1): Pt will sustain attention to basic familiar task for 5 minutes with Mod cues SLP Short Term Goal 4 (Week 1): Pt will verbalize at least 1cognitive and 1 physical impairment with Mod cues SLP Short Term Goal 5 (Week 1): Pt will demonstrate basic problem solving during basic, familiar task with Mod cues SLP Short Term Goal 6 (Week 1): Pt will utilize external memory aids to recall new and daily information with Max cues  Skilled Therapeutic Interventions: Skilled ST intervention provided with focus on dysphagia and cognitive goals. Slp repositioned pt to upright 90 degrees in bed, with assistance of nurse. Pt required max verbal cues for use of compensatory swallow strategies and to redirect to meal. Pt consumed puree/ NTL and trials of D2 with delayed cough noted following completion of snack. Pt able to clear oral cavity with liquid wash and min cuing. Pt noted with decreased attention, requiring cuing to open eyes and keep chewing every ~30 seconds. Pt followed 1-step simple commands, within context with 80% accuracy.  Slp observed pt taking meds, crushed in puree with no difficulties. Pt spit out small pieces of graham cracker in applesauce x2, during trial.  FIM:  Comprehension Comprehension Mode: Auditory Comprehension: 2-Understands basic 25 - 49% of the time/requires cueing 51 - 75% of the time Expression Expression Mode: Verbal Expression: 2-Expresses basic 25 - 49% of the time/requires cueing 50 - 75% of the  time. Uses single words/gestures. Social Interaction Social Interaction: 2-Interacts appropriately 25 - 49% of time - Needs frequent redirection. Problem Solving Problem Solving: 1-Solves basic less than 25% of the time - needs direction nearly all the time or does not effectively solve problems and may need a restraint for safety Memory Memory: 1-Recognizes or recalls less than 25% of the time/requires cueing greater than 75% of the time FIM - Eating Eating Activity: 4: Help with managing cup/glass;3: Helper brings food to mouth every scoop  Pain Pain Assessment Pain Assessment: No/denies pain  Therapy/Group: Individual Therapy  McMasters, Kara PacerLeah N 09/15/2013, 9:48 AM

## 2013-09-16 ENCOUNTER — Inpatient Hospital Stay (HOSPITAL_COMMUNITY): Payer: Medicare Other | Admitting: Occupational Therapy

## 2013-09-16 ENCOUNTER — Inpatient Hospital Stay (HOSPITAL_COMMUNITY): Payer: Medicare Other

## 2013-09-16 DIAGNOSIS — R7989 Other specified abnormal findings of blood chemistry: Secondary | ICD-10-CM

## 2013-09-16 LAB — BASIC METABOLIC PANEL
BUN: 26 mg/dL — ABNORMAL HIGH (ref 6–23)
CO2: 24 mEq/L (ref 19–32)
Calcium: 8.7 mg/dL (ref 8.4–10.5)
Chloride: 120 mEq/L — ABNORMAL HIGH (ref 96–112)
Creatinine, Ser: 0.91 mg/dL (ref 0.50–1.35)
GFR calc Af Amer: >90 mL/min (ref >90)
GFR calc non Af Amer: 80 mL/min — ABNORMAL LOW (ref >90)
Glucose, Bld: 151 mg/dL — ABNORMAL HIGH (ref 70–99)
Potassium: 3.5 mEq/L — ABNORMAL LOW (ref 3.7–5.3)
Sodium: 156 mEq/L — ABNORMAL HIGH (ref 137–147)

## 2013-09-16 LAB — HEPATIC FUNCTION PANEL
ALT: 198 U/L — ABNORMAL HIGH (ref 0–53)
AST: 251 U/L — ABNORMAL HIGH (ref 0–37)
Albumin: 1.8 g/dL — ABNORMAL LOW (ref 3.5–5.2)
Alkaline Phosphatase: 1534 U/L — ABNORMAL HIGH (ref 39–117)
Bilirubin, Direct: 0.3 mg/dL (ref 0.0–0.3)
Indirect Bilirubin: 0.4 mg/dL (ref 0.3–0.9)
Total Bilirubin: 0.7 mg/dL (ref 0.3–1.2)
Total Protein: 7.6 g/dL (ref 6.0–8.3)

## 2013-09-16 LAB — GLUCOSE, CAPILLARY
Glucose-Capillary: 141 mg/dL — ABNORMAL HIGH (ref 70–99)
Glucose-Capillary: 117 mg/dL — ABNORMAL HIGH (ref 70–99)
Glucose-Capillary: 113 mg/dL — ABNORMAL HIGH (ref 70–99)
Glucose-Capillary: 310 mg/dL — ABNORMAL HIGH (ref 70–99)

## 2013-09-16 LAB — PROTIME-INR
INR: 1.46 (ref 0.00–1.49)
Prothrombin Time: 17.7 seconds — ABNORMAL HIGH (ref 11.6–15.2)

## 2013-09-16 MED ORDER — POTASSIUM CHLORIDE IN NACL 20-0.45 MEQ/L-% IV SOLN
INTRAVENOUS | Status: DC
  Administered 2013-09-16 – 2013-10-08 (×32): via INTRAVENOUS
  Filled 2013-09-16 (×50): qty 1000

## 2013-09-16 NOTE — Progress Notes (Signed)
Progress Note   Subjective  No complaints. Awoke him from sleep   Objective   Vital signs in last 24 hours: Temp:  [98.1 F (36.7 C)-98.5 F (36.9 C)] 98.1 F (36.7 C) (06/28 0518) Pulse Rate:  [78-88] 78 (06/28 0518) Resp:  [16-18] 18 (06/28 0518) BP: (119-144)/(78-83) 137/78 mmHg (06/28 0518) SpO2:  [94 %-97 %] 94 % (06/28 0518) Last BM Date: 09/14/13 General:    white male in NAD Abdomen:  Soft, nontender and nondistended. Normal bowel sounds. Extremities:  Without edema. Neurologic:  Easily awoken. Confused. No significant asterixis but poor participation in exam Psych:  Cooperative.   BMET  Recent Labs  09/14/13 0535 09/15/13 0449 09/16/13 0440  NA 153* 157* 156*  K 3.9 4.0 3.5*  CL 117* 121* 120*  CO2 $Re'23 23 24  'bLQ$ GLUCOSE 179* 281* 151*  BUN 30* 33* 26*  CREATININE 1.00 1.12 0.91  CALCIUM 9.1 8.9 8.7   LFT  Recent Labs  09/15/13 0449  PROT 7.9  ALBUMIN 1.9*  AST 802*  ALT 444*  ALKPHOS 1805*  BILITOT 0.6     Assessment / Plan:    80 year old male with abnormal LFTs, mixed pattern with. elevated alkaline phos / transaminitis. Question of drug induced liver injury. Statin stopped. Was on Eliquis but not felt to be culprit. No labs in EPIC for comparison. CTscan reveals only chronic calcific pancreatitis. Chronic pancreatitis can cause elevated alkaline phos but usually not to this degree. PBC can cause such elevations in alk phos but IgG elevated raising possibility of autoimmune hepatitis. Autoimmune hepatitis, on other hand is more hepatocellular making the markedly elevated alk phos confusing.  Viral hep studies negative. Ferritin markedly elevated but saturation ratio low making hemochromatosis unlikely. Will need additional labs (ANA, AMA, ASMA) for look for autoimmune / genetic causes of liver disease. Alpha one antitrypsin pending.Will also obtain ultrasound with doppler studies of PV, HV,hepatic artery.   2. Chronic pancreatitis by CTscan. A 2 cm  cystic lesion is seen in pancreatic tail, may represent a small pseudocyst but is indeterminate.   3. CVA   LOS: 5 days   Tye Savoy  09/16/2013, 9:25 AM   Addendum:  unclear cause for elevated LFTs, possibly drug-induced liver injury Ultrasound liver with Doppler pending Additional autoimmune labs pending, followup thereafter If worsening and no clear cause, biopsy to be considered

## 2013-09-16 NOTE — Progress Notes (Signed)
Patient ID: MELINDA POTTINGER, male   DOB: Nov 10, 1933, 80 y.o.   MRN: [id number]   Patient ID: TRAVIUS CROCHET, male   DOB: 22-Feb-1934, 80 y.o.   MRN: [id number]  09/16/13.  Subjective/Complaints:  80 y/o admit for CIR with functional deficits secondary to bilateral cerebral infarct embolic  Confused oriented to self (unchanged vs admit).    Started on supplemental IVF yesterday due to hypernatremia and poor PO intake; Potassium down to 3.5 this am. Eliquis d/ced yesterday (possible caused of increased  LFTs) and lovenox substituted.  Review of Systems - limited secondary to mental status    Objective: Vital Signs: Blood pressure 137/78, pulse 78, temperature 98.1 F (36.7 C), temperature source Oral, resp. rate 18, weight 188 lb 11.2 oz (85.594 kg), SpO2 94.00%. No results found. Results for orders placed during the hospital encounter of 09/11/13 (from the past 72 hour(s))  GLUCOSE, CAPILLARY     Status: Abnormal   Collection Time    09/13/13 11:39 AM      Result Value Ref Range   Glucose-Capillary 264 (*) 70 - 99 mg/dL   Comment 1 Notify RN    GLUCOSE, CAPILLARY     Status: Abnormal   Collection Time    09/13/13  4:22 PM      Result Value Ref Range   Glucose-Capillary 443 (*) 70 - 99 mg/dL   Comment 1 Notify RN    GLUCOSE, CAPILLARY     Status: Abnormal   Collection Time    09/13/13  9:29 PM      Result Value Ref Range   Glucose-Capillary 329 (*) 70 - 99 mg/dL  COMPREHENSIVE METABOLIC PANEL     Status: Abnormal   Collection Time    09/14/13  5:35 AM      Result Value Ref Range   Sodium 153 (*) 137 - 147 mEq/L   Potassium 3.9  3.7 - 5.3 mEq/L   Chloride 117 (*) 96 - 112 mEq/L   CO2 23  19 - 32 mEq/L   Glucose, Bld 179 (*) 70 - 99 mg/dL   BUN 30 (*) 6 - 23 mg/dL   Creatinine, Ser 1.00  0.50 - 1.35 mg/dL   Calcium 9.1  8.4 - 10.5 mg/dL   Total Protein 7.7  6.0 - 8.3 g/dL   Albumin 1.8 (*) 3.5 - 5.2 g/dL   AST 313 (*) 0 - 37 U/L   ALT 200 (*) 0 - 53 U/L   Alkaline  Phosphatase 1587 (*) 39 - 117 U/L   Total Bilirubin 0.5  0.3 - 1.2 mg/dL   GFR calc non Af Amer 70 (*) >90 mL/min   GFR calc Af Amer 82 (*) >90 mL/min   Comment: (NOTE)     The eGFR has been calculated using the CKD EPI equation.     This calculation has not been validated in all clinical situations.     eGFR's persistently <90 mL/min signify possible Chronic Kidney     Disease.  LIPASE, BLOOD     Status: Abnormal   Collection Time    09/14/13  5:35 AM      Result Value Ref Range   Lipase 116 (*) 11 - 59 U/L  GLUCOSE, CAPILLARY     Status: Abnormal   Collection Time    09/14/13  7:21 AM      Result Value Ref Range   Glucose-Capillary 141 (*) 70 - 99 mg/dL   Comment 1 Notify RN    GLUCOSE,  CAPILLARY     Status: Abnormal   Collection Time    09/14/13 12:14 PM      Result Value Ref Range   Glucose-Capillary 304 (*) 70 - 99 mg/dL   Comment 1 Notify RN    HEPATITIS B SURFACE ANTIBODY     Status: None   Collection Time    09/14/13  1:20 PM      Result Value Ref Range   Hep B S Ab NEGATIVE  NEGATIVE   Comment: Performed at [place]     Status: None   Collection Time    09/14/13  1:20 PM      Result Value Ref Range   Hepatitis B Surface Ag NEGATIVE  NEGATIVE   Comment: Performed at [place]     Status: None   Collection Time    09/14/13  1:20 PM      Result Value Ref Range   HCV Ab NEGATIVE  NEGATIVE   Comment: Performed at [place], IGA, IGM     Status: Abnormal   Collection Time    09/14/13  1:20 PM      Result Value Ref Range   IgG (Immunoglobin G), Serum 2290 (*) 650 - 1600 mg/dL   IgA 1040 (*) 68 - 379 mg/dL   IgM, Serum 56 (*) 41 - 251 mg/dL   Comment: Performed at [place]     Status: Abnormal   Collection Time    09/14/13  1:20 PM      Result Value Ref Range   Ferritin 1492 (*) 22 - 322 ng/mL   Comment: Result repeated and verified.     Performed  at [place] TIBC     Status: Abnormal   Collection Time    09/14/13  1:20 PM      Result Value Ref Range   Iron 30 (*) 42 - 135 ug/dL   TIBC 182 (*) 215 - 435 ug/dL   Saturation Ratios 16 (*) 20 - 55 %   UIBC 152  125 - 400 ug/dL   Comment: Performed at [place], CAPILLARY     Status: Abnormal   Collection Time    09/14/13  4:56 PM      Result Value Ref Range   Glucose-Capillary 280 (*) 70 - 99 mg/dL  GLUCOSE, CAPILLARY     Status: Abnormal   Collection Time    09/14/13  9:03 PM      Result Value Ref Range   Glucose-Capillary 378 (*) 70 - 99 mg/dL  GLUCOSE, CAPILLARY     Status: Abnormal   Collection Time    09/14/13 11:58 PM      Result Value Ref Range   Glucose-Capillary 377 (*) 70 - 99 mg/dL  COMPREHENSIVE METABOLIC PANEL     Status: Abnormal   Collection Time    09/15/13  4:49 AM      Result Value Ref Range   Sodium 157 (*) 137 - 147 mEq/L   Potassium 4.0  3.7 - 5.3 mEq/L   Chloride 121 (*) 96 - 112 mEq/L   CO2 23  19 - 32 mEq/L   Glucose, Bld 281 (*) 70 - 99 mg/dL   BUN 33 (*) 6 - 23 mg/dL   Creatinine, Ser 1.12  0.50 - 1.35 mg/dL   Calcium 8.9  8.4 - 10.5 mg/dL  Total Protein 7.9  6.0 - 8.3 g/dL   Albumin 1.9 (*) 3.5 - 5.2 g/dL   AST 802 (*) 0 - 37 U/L   ALT 444 (*) 0 - 53 U/L   Alkaline Phosphatase 1805 (*) 39 - 117 U/L   Total Bilirubin 0.6  0.3 - 1.2 mg/dL   GFR calc non Af Amer 61 (*) >90 mL/min   GFR calc Af Amer 71 (*) >90 mL/min   Comment: (NOTE)     The eGFR has been calculated using the CKD EPI equation.     This calculation has not been validated in all clinical situations.     eGFR's persistently <90 mL/min signify possible Chronic Kidney     Disease.  GLUCOSE, CAPILLARY     Status: Abnormal   Collection Time    09/15/13  7:16 AM      Result Value Ref Range   Glucose-Capillary 226 (*) 70 - 99 mg/dL   Comment 1 Notify RN    GLUCOSE, CAPILLARY     Status: Abnormal   Collection Time    09/15/13 11:32  AM      Result Value Ref Range   Glucose-Capillary 230 (*) 70 - 99 mg/dL   Comment 1 Notify RN    GLUCOSE, CAPILLARY     Status: Abnormal   Collection Time    09/15/13  4:25 PM      Result Value Ref Range   Glucose-Capillary 237 (*) 70 - 99 mg/dL   Comment 1 Notify RN    GLUCOSE, CAPILLARY     Status: Abnormal   Collection Time    09/15/13  9:21 PM      Result Value Ref Range   Glucose-Capillary 141 (*) 70 - 99 mg/dL   Comment 1 Notify RN    BASIC METABOLIC PANEL     Status: Abnormal   Collection Time    09/16/13  4:40 AM      Result Value Ref Range   Sodium 156 (*) 137 - 147 mEq/L   Potassium 3.5 (*) 3.7 - 5.3 mEq/L   Chloride 120 (*) 96 - 112 mEq/L   CO2 24  19 - 32 mEq/L   Glucose, Bld 151 (*) 70 - 99 mg/dL   BUN 26 (*) 6 - 23 mg/dL   Creatinine, Ser 0.91  0.50 - 1.35 mg/dL   Calcium 8.7  8.4 - 10.5 mg/dL   GFR calc non Af Amer 80 (*) >90 mL/min   GFR calc Af Amer >90  >90 mL/min   Comment: (NOTE)     The eGFR has been calculated using the CKD EPI equation.     This calculation has not been validated in all clinical situations.     eGFR's persistently <90 mL/min signify possible Chronic Kidney     Disease.  GLUCOSE, CAPILLARY     Status: Abnormal   Collection Time    09/16/13  7:10 AM      Result Value Ref Range   Glucose-Capillary 141 (*) 70 - 99 mg/dL   Comment 1 Notify RN       Intake/Output Summary (Last 24 hours) at 09/16/13 0755 Last data filed at 09/16/13 0513  Gross per 24 hour  Intake    470 ml  Output   1250 ml  Net   -780 ml    Patient Vitals for the past 24 hrs:  BP Temp Temp src Pulse Resp SpO2  09/16/13 0518 137/78 mmHg 98.1 F (36.7 C) Oral 78  18 94 %  09/15/13 2203 144/83 mmHg 98.5 F (36.9 C) Oral 88 17 97 %  09/15/13 1500 119/78 mmHg 98.1 F (36.7 C) Oral 85 16 97 %   CBG (last 3)   Recent Labs  09/15/13 1625 09/15/13 2121 09/16/13 0710  GLUCAP 237* 141* 141*    HEENT: normal Cardio: irregular and no murmurs Resp: CTA B/L  and unlabored GI: BS positive and large bilateral inguinal mass, non reducable mildly tender Extremity:  No Edema Skin:    PICC line R armNeuro: Confused, Abnormal Sensory cannot assess secondary to mental status, Abnormal Motor 4/5 in BUE, 3/5 in BLE, Abnormal FMC Ataxic/ dec FMC, Dysarthric and Apraxic Musc/Skel:  Normal Gen NAD GU- foley cath   Assessment/Plan:  Medical Problem List and Plan:  1. Functional deficits secondary to acute cerebral white matter bilateral infarct  2. DVT Prophylaxis/Anticoagulation: Eliquis. Monitor for any bleeding episodes  3. Pain Management: Tylenol as needed  4. Dysphagia with decreased nutritional storage. Dysphagia 1 nectar liquids. Followup speech therapy monitor hydration. Consider appetite stimulant  5. Neuropsych: This patient is not capable of making decisions on his own behalf.  6. Hypertension/atrial fibrillation. Norvasc 10 mg daily, hydralazine 10 mg 4 times a day, metoprolol 25 mg every 8 hours. Monitor with increased mobility  7 ID/methicillin sensitive staph aureus. Continue cefazolin 2 g intravenously every 8 hours x4 weeks total initiated 08/29/2013. Monitor for any fever  8. Diabetes mellitus with peripheral neuropathy. Check CBGs a.c. and at bedtime. Levemir 20 units each bedtime.  Continue SSI coverage 9. Integumentary: local skin care, turning, maintain foley for now, mattress overlay, WOC, Diflucan for yeast 10.  Inguinal mass, bilateral hernia, obviously chronic and not incarcerated, given recent CVA, no elective surg recommended at this time 11.  Elevated alk phos, AST,ALT and lipase, stop Lipitor, ? Etiol, CT abd with small pancreatic cyst at tail ,  abd exam normal except hernia, GI eval 12. Worsening hypernatremia with poor po intake;  Will  Continue supplement with IVF today and add potassium supplement.  Check Bmet in am.  LOS (Days) 5 A FACE TO FACE EVALUATION WAS PERFORMED  Nyoka Cowden 09/16/2013, 7:55 AM

## 2013-09-17 ENCOUNTER — Inpatient Hospital Stay (HOSPITAL_COMMUNITY): Payer: Medicare Other | Admitting: Occupational Therapy

## 2013-09-17 ENCOUNTER — Inpatient Hospital Stay (HOSPITAL_COMMUNITY): Payer: Medicare Other

## 2013-09-17 ENCOUNTER — Inpatient Hospital Stay (HOSPITAL_COMMUNITY): Payer: Medicare Other | Admitting: Speech Pathology

## 2013-09-17 DIAGNOSIS — I6340 Cerebral infarction due to embolism of unspecified cerebral artery: Secondary | ICD-10-CM

## 2013-09-17 LAB — ALPHA-1-ANTITRYPSIN: A-1 Antitrypsin, Ser: 143 mg/dL (ref 83–199)

## 2013-09-17 LAB — MITOCHONDRIAL ANTIBODIES: Mitochondrial M2 Ab, IgG: 0.39 (ref <0.91)

## 2013-09-17 LAB — BASIC METABOLIC PANEL
BUN: 21 mg/dL (ref 6–23)
CO2: 24 mEq/L (ref 19–32)
Calcium: 8.5 mg/dL (ref 8.4–10.5)
Chloride: 119 mEq/L — ABNORMAL HIGH (ref 96–112)
Creatinine, Ser: 0.9 mg/dL (ref 0.50–1.35)
GFR calc Af Amer: >90 mL/min (ref >90)
GFR calc non Af Amer: 80 mL/min — ABNORMAL LOW (ref >90)
Glucose, Bld: 116 mg/dL — ABNORMAL HIGH (ref 70–99)
Potassium: 3.7 mEq/L (ref 3.7–5.3)
Sodium: 154 mEq/L — ABNORMAL HIGH (ref 137–147)

## 2013-09-17 LAB — HEPATIC FUNCTION PANEL
ALT: 259 U/L — ABNORMAL HIGH (ref 0–53)
AST: 363 U/L — ABNORMAL HIGH (ref 0–37)
Albumin: 1.8 g/dL — ABNORMAL LOW (ref 3.5–5.2)
Alkaline Phosphatase: 1720 U/L — ABNORMAL HIGH (ref 39–117)
Bilirubin, Direct: 0.3 mg/dL (ref 0.0–0.3)
Indirect Bilirubin: 0.3 mg/dL (ref 0.3–0.9)
Total Bilirubin: 0.6 mg/dL (ref 0.3–1.2)
Total Protein: 7.8 g/dL (ref 6.0–8.3)

## 2013-09-17 LAB — GLUCOSE, CAPILLARY
Glucose-Capillary: 108 mg/dL — ABNORMAL HIGH (ref 70–99)
Glucose-Capillary: 143 mg/dL — ABNORMAL HIGH (ref 70–99)
Glucose-Capillary: 190 mg/dL — ABNORMAL HIGH (ref 70–99)
Glucose-Capillary: 259 mg/dL — ABNORMAL HIGH (ref 70–99)

## 2013-09-17 LAB — ANA
Anti Nuclear Antibody(ANA): NEGATIVE
Anti Nuclear Antibody(ANA): NEGATIVE

## 2013-09-17 LAB — ANTI-SMOOTH MUSCLE ANTIBODY, IGG: F-Actin IgG: 17 U (ref <20)

## 2013-09-17 MED ORDER — OXYMETAZOLINE HCL 0.05 % NA SOLN
1.0000 | Freq: Three times a day (TID) | NASAL | Status: AC
  Administered 2013-09-17 – 2013-09-20 (×9): 1 via NASAL
  Filled 2013-09-17: qty 15

## 2013-09-17 NOTE — Progress Notes (Signed)
Subjective/Complaints: No abd pain, no nausea or vomiting No nausea or vomiting Confused oriented to self (unchanged vs admit) Appreciate GI notes Reviewed LFTs which are still elevated but down from peak on 6/27 Review of Systems - limited secondary to mental status Objective: Vital Signs: Blood pressure 156/81, pulse 80, temperature 98.2 F (36.8 C), temperature source Oral, resp. rate 19, weight 85.594 kg (188 lb 11.2 oz), SpO2 96.00%. [place] Art/ven Flow Abd Pelv Doppler  09/17/2013   CLINICAL DATA:  Elevated LFTs  EXAM: DUPLEX ULTRASOUND OF LIVER  TECHNIQUE: Color and duplex Doppler ultrasound was performed to evaluate the hepatic in-flow and out-flow vessels.  COMPARISON:  CT the abdomen pelvis- 09/12/2013  FINDINGS: The examination is minimally degraded due to patient's inability to breath hold during the examination as well as patient motion artifact.  Portal Vein Velocities  Main:  40.6 cm/sec  Right:  39.8 cm/sec  Left:  32.6 cm/sec  Hepatic Vein Velocities  Right:  44.8 cm/sec  Middle:  49.2 cm/sec  Left: Suboptimally visualized though appears patent with velocity of 36.2 cm/sec  Hepatic Artery Velocity:  74.8 cm/sec  Splenic Vein Velocity:  28.6 cm/sec  Varices: None visualize  Ascites: None present  Spleen:  The spleen is normal in size measuring 8.7 x 9.5 x 4.4 cm.  IMPRESSION: No explanation for patient's elevated LFTs. Specifically normal acquired directional flow, velocities, and waveforms within the hepatic vascular system.   Electronically Signed   By: Sandi Mariscal M.D.   On: 09/17/2013 08:01   Results for orders placed during the hospital encounter of 09/11/13 (from the past 72 hour(s))  GLUCOSE, CAPILLARY     Status: Abnormal   Collection Time    09/14/13 12:14 PM      Result Value Ref Range   Glucose-Capillary 304 (*) 70 - 99 mg/dL   Comment 1 Notify RN    HEPATITIS B SURFACE ANTIBODY     Status: None   Collection Time    09/14/13  1:20 PM      Result Value Ref Range   Hep B  S Ab NEGATIVE  NEGATIVE   Comment: Performed at [place]     Status: None   Collection Time    09/14/13  1:20 PM      Result Value Ref Range   Hepatitis B Surface Ag NEGATIVE  NEGATIVE   Comment: Performed at [facility]     Status: None   Collection Time    09/14/13  1:20 PM      Result Value Ref Range   HCV Ab NEGATIVE  NEGATIVE   Comment: Performed at [facility], IGA, IGM     Status: Abnormal   Collection Time    09/14/13  1:20 PM      Result Value Ref Range   IgG (Immunoglobin G), Serum 2290 (*) 650 - 1600 mg/dL   IgA 1040 (*) 68 - 379 mg/dL   IgM, Serum 56 (*) 41 - 251 mg/dL   Comment: Performed at [place]     Status: Abnormal   Collection Time    09/14/13  1:20 PM      Result Value Ref Range   Ferritin 1492 (*) 22 - 322 ng/mL   Comment: Result repeated and verified.     Performed at [place] TIBC     Status: Abnormal   Collection Time  09/14/13  1:20 PM      Result Value Ref Range   Iron 30 (*) 42 - 135 ug/dL   TIBC 182 (*) 215 - 435 ug/dL   Saturation Ratios 16 (*) 20 - 55 %   UIBC 152  125 - 400 ug/dL   Comment: Performed at [facility], CAPILLARY     Status: Abnormal   Collection Time    09/14/13  4:56 PM      Result Value Ref Range   Glucose-Capillary 280 (*) 70 - 99 mg/dL  GLUCOSE, CAPILLARY     Status: Abnormal   Collection Time    09/14/13  9:03 PM      Result Value Ref Range   Glucose-Capillary 378 (*) 70 - 99 mg/dL  GLUCOSE, CAPILLARY     Status: Abnormal   Collection Time    09/14/13 11:58 PM      Result Value Ref Range   Glucose-Capillary 377 (*) 70 - 99 mg/dL  COMPREHENSIVE METABOLIC PANEL     Status: Abnormal   Collection Time    09/15/13  4:49 AM      Result Value Ref Range   Sodium 157 (*) 137 - 147 mEq/L   Potassium 4.0  3.7 - 5.3 mEq/L   Chloride 121 (*) 96 - 112 mEq/L   CO2 23  19 -  32 mEq/L   Glucose, Bld 281 (*) 70 - 99 mg/dL   BUN 33 (*) 6 - 23 mg/dL   Creatinine, Ser 1.12  0.50 - 1.35 mg/dL   Calcium 8.9  8.4 - 10.5 mg/dL   Total Protein 7.9  6.0 - 8.3 g/dL   Albumin 1.9 (*) 3.5 - 5.2 g/dL   AST 802 (*) 0 - 37 U/L   ALT 444 (*) 0 - 53 U/L   Alkaline Phosphatase 1805 (*) 39 - 117 U/L   Total Bilirubin 0.6  0.3 - 1.2 mg/dL   GFR calc non Af Amer 61 (*) >90 mL/min   GFR calc Af Amer 71 (*) >90 mL/min   Comment: (NOTE)     The eGFR has been calculated using the CKD EPI equation.     This calculation has not been validated in all clinical situations.     eGFR's persistently <90 mL/min signify possible Chronic Kidney     Disease.  GLUCOSE, CAPILLARY     Status: Abnormal   Collection Time    09/15/13  7:16 AM      Result Value Ref Range   Glucose-Capillary 226 (*) 70 - 99 mg/dL   Comment 1 Notify RN    GLUCOSE, CAPILLARY     Status: Abnormal   Collection Time    09/15/13 11:32 AM      Result Value Ref Range   Glucose-Capillary 230 (*) 70 - 99 mg/dL   Comment 1 Notify RN    GLUCOSE, CAPILLARY     Status: Abnormal   Collection Time    09/15/13  4:25 PM      Result Value Ref Range   Glucose-Capillary 237 (*) 70 - 99 mg/dL   Comment 1 Notify RN    GLUCOSE, CAPILLARY     Status: Abnormal   Collection Time    09/15/13  9:21 PM      Result Value Ref Range   Glucose-Capillary 141 (*) 70 - 99 mg/dL   Comment 1 Notify RN    BASIC METABOLIC PANEL     Status: Abnormal   Collection Time  09/16/13  4:40 AM      Result Value Ref Range   Sodium 156 (*) 137 - 147 mEq/L   Potassium 3.5 (*) 3.7 - 5.3 mEq/L   Chloride 120 (*) 96 - 112 mEq/L   CO2 24  19 - 32 mEq/L   Glucose, Bld 151 (*) 70 - 99 mg/dL   BUN 26 (*) 6 - 23 mg/dL   Creatinine, Ser 0.91  0.50 - 1.35 mg/dL   Calcium 8.7  8.4 - 10.5 mg/dL   GFR calc non Af Amer 80 (*) >90 mL/min   GFR calc Af Amer >90  >90 mL/min   Comment: (NOTE)     The eGFR has been calculated using the CKD EPI equation.      This calculation has not been validated in all clinical situations.     eGFR's persistently <90 mL/min signify possible Chronic Kidney     Disease.  GLUCOSE, CAPILLARY     Status: Abnormal   Collection Time    09/16/13  7:10 AM      Result Value Ref Range   Glucose-Capillary 141 (*) 70 - 99 mg/dL   Comment 1 Notify RN    GLUCOSE, CAPILLARY     Status: Abnormal   Collection Time    09/16/13 11:43 AM      Result Value Ref Range   Glucose-Capillary 117 (*) 70 - 99 mg/dL   Comment 1 Notify RN    HEPATIC FUNCTION PANEL     Status: Abnormal   Collection Time    09/16/13  1:55 PM      Result Value Ref Range   Total Protein 7.6  6.0 - 8.3 g/dL   Albumin 1.8 (*) 3.5 - 5.2 g/dL   AST 251 (*) 0 - 37 U/L   ALT 198 (*) 0 - 53 U/L   Alkaline Phosphatase 1534 (*) 39 - 117 U/L   Total Bilirubin 0.7  0.3 - 1.2 mg/dL   Bilirubin, Direct 0.3  0.0 - 0.3 mg/dL   Indirect Bilirubin 0.4  0.3 - 0.9 mg/dL  PROTIME-INR     Status: Abnormal   Collection Time    09/16/13  1:55 PM      Result Value Ref Range   Prothrombin Time 17.7 (*) 11.6 - 15.2 seconds   INR 1.46  0.00 - 1.49  GLUCOSE, CAPILLARY     Status: Abnormal   Collection Time    09/16/13  5:22 PM      Result Value Ref Range   Glucose-Capillary 113 (*) 70 - 99 mg/dL   Comment 1 Notify RN    GLUCOSE, CAPILLARY     Status: Abnormal   Collection Time    09/16/13  8:48 PM      Result Value Ref Range   Glucose-Capillary 310 (*) 70 - 99 mg/dL   Comment 1 Notify RN    BASIC METABOLIC PANEL     Status: Abnormal   Collection Time    09/17/13  6:05 AM      Result Value Ref Range   Sodium 154 (*) 137 - 147 mEq/L   Potassium 3.7  3.7 - 5.3 mEq/L   Chloride 119 (*) 96 - 112 mEq/L   CO2 24  19 - 32 mEq/L   Glucose, Bld 116 (*) 70 - 99 mg/dL   BUN 21  6 - 23 mg/dL   Creatinine, Ser 0.90  0.50 - 1.35 mg/dL   Calcium 8.5  8.4 - 10.5 mg/dL  GFR calc non Af Amer 80 (*) >90 mL/min   GFR calc Af Amer >90  >90 mL/min   Comment: (NOTE)     The  eGFR has been calculated using the CKD EPI equation.     This calculation has not been validated in all clinical situations.     eGFR's persistently <90 mL/min signify possible Chronic Kidney     Disease.  HEPATIC FUNCTION PANEL     Status: Abnormal   Collection Time    09/17/13  6:05 AM      Result Value Ref Range   Total Protein 7.8  6.0 - 8.3 g/dL   Albumin 1.8 (*) 3.5 - 5.2 g/dL   AST 363 (*) 0 - 37 U/L   ALT 259 (*) 0 - 53 U/L   Alkaline Phosphatase 1720 (*) 39 - 117 U/L   Total Bilirubin 0.6  0.3 - 1.2 mg/dL   Bilirubin, Direct 0.3  0.0 - 0.3 mg/dL   Indirect Bilirubin 0.3  0.3 - 0.9 mg/dL  GLUCOSE, CAPILLARY     Status: Abnormal   Collection Time    09/17/13  7:51 AM      Result Value Ref Range   Glucose-Capillary 108 (*) 70 - 99 mg/dL     HEENT: normal Cardio: irregular and no murmurs Resp: CTA B/L and unlabored GI: BS positive and large bilateral inguinal mass, nontender Extremity:  No Edema  Neuro: Confused, Abnormal Sensory cannot assess secondary to mental status, Abnormal Motor 4/5 in BUE, 3/5 in BLE, Abnormal FMC Ataxic/ dec FMC, Dysarthric and Apraxic Musc/Skel:  Normal Gen NAD   Assessment/Plan: 1. Functional deficits secondary to bilateral cerebral infarct embolic which require 3+ hours per day of interdisciplinary therapy in a comprehensive inpatient rehab setting. Physiatrist is providing close team supervision and 24 hour management of active medical problems listed below. Physiatrist and rehab team continue to assess barriers to discharge/monitor patient progress toward functional and medical goals.  FIM: FIM - Bathing Bathing Steps Patient Completed: Chest Bathing: 0: Activity did not occur  FIM - Upper Body Dressing/Undressing Upper body dressing/undressing steps patient completed: Thread/unthread right sleeve of pullover shirt/dresss;Thread/unthread left sleeve of pullover shirt/dress Upper body dressing/undressing: 0: Activity did not occur FIM  - Lower Body Dressing/Undressing Lower body dressing/undressing: 1: Two helpers  FIM - Toileting Toileting: 0: Activity did not occur  FIM - Radio producer Devices: Bedside commode;Grab bars Toilet Transfers: 0-Activity did not occur  FIM - Control and instrumentation engineer Devices: Sliding board Bed/Chair Transfer: 2: Supine > Sit: Max A (lifting assist/Pt. 25-49%);1: Two helpers;2: Bed > Chair or W/C: Max A (lift and lower assist)  FIM - Locomotion: Wheelchair Locomotion: Wheelchair: 1: Total Assistance/staff pushes wheelchair (Pt<25%) FIM - Locomotion: Ambulation Locomotion: Ambulation Assistive Devices:  (unable to safely perform) Locomotion: Ambulation: 0: Activity did not occur  Comprehension Comprehension Mode: Auditory Comprehension: 2-Understands basic 25 - 49% of the time/requires cueing 51 - 75% of the time  Expression Expression Mode: Verbal Expression: 2-Expresses basic 25 - 49% of the time/requires cueing 50 - 75% of the time. Uses single words/gestures.  Social Interaction Social Interaction: 2-Interacts appropriately 25 - 49% of time - Needs frequent redirection.  Problem Solving Problem Solving: 1-Solves basic less than 25% of the time - needs direction nearly all the time or does not effectively solve problems and may need a restraint for safety  Memory Memory: 1-Recognizes or recalls less than 25% of the time/requires cueing greater than 75%  of the time  Medical Problem List and Plan:  1. Functional deficits secondary to acute cerebral white matter bilateral infarct  2. DVT Prophylaxis/Anticoagulation: Eliquis. Monitor for any bleeding episodes  3. Pain Management: Tylenol as needed  4. Dysphagia with decreased nutritional storage. Dysphagia 1 nectar liquids. Followup speech therapy monitor hydration. Consider appetite stimulant  5. Neuropsych: This patient is not capable of making decisions on his own behalf.   6. Hypertension/atrial fibrillation. Norvasc 10 mg daily, hydralazine 10 mg 4 times a day, metoprolol 25 mg every 8 hours. Monitor with increased mobility  7 ID/methicillin sensitive staph aureus. Continue cefazolin 2 g intravenously every 8 hours x4 weeks total initiated 08/29/2013. Monitor for any fever  8. Diabetes mellitus with peripheral neuropathy. Check CBGs a.c. and at bedtime. Levemir 20 units each bedtime.  9. Integumentary: local skin care, turning, maintain foley for now, mattress overlay, WOC, Diflucan for yeast 10.  Inguinal mass, bilateral hernia, obviously chronic and not incarcerated, given recent CVA, no elective surg recommended at this time 11.  Elevated alk phos, AST,ALT and lipase, stop Lipitor, appreciate GI eval, ? lipitor vs autoimmune with increased IgG and IgA LOS (Days) 6 A FACE TO FACE EVALUATION WAS PERFORMED  KIRSTEINS,ANDREW E 09/17/2013, 9:58 AM

## 2013-09-17 NOTE — Progress Notes (Signed)
Occupational Therapy Session Note  Patient Details  Name: Elijah [name] MRN: [id number] Date of Birth: 04/28/1933  Today's Date: 09/17/2013 Time: 0805-0902 Time Calculation (min): 57 min  Short Term Goals: Week 1:  OT Short Term Goal 1 (Week 1): Pt will be able to sit EOB with mod A x 1 for 10 min to engage in UB bathing and dressing. OT Short Term Goal 2 (Week 1): Pt will be able to bathe his UB with min A. OT Short Term Goal 3 (Week 1): Pt will be able to don a tshirt with mod A. OT Short Term Goal 4 (Week 1): Pt will be able to transfer to w/c from bed with max A x1. OT Short Term Goal 5 (Week 1): Pt will demonstrate improved direction following during simple grooming with min cues.  Skilled Therapeutic Interventions/Progress Updates:    Pt scheduled for B/D this am, but UB bathing/ dressing not completed due to pt nose bleed. Pt seen this am to facilitate transitional movements of bed mobility and transfers. Pt in bed and lethargic, but did respond to stimulation that he was to get out of bed soon. Pt soiled and needed cleansing and brief change. Pt demonstrated good initiation with rolling to his R, he needed increased guidance and assist to L during clean up. To don pants, pt asked to pull his pants over his hips using B hands from a bridge position. Difficult for pt to bridge, but he initiated on his own to roll and pull pants up in alternating fashion with assist initially to guide his hands.  Pt guided to lift head and reach with R shoulder to fully roll on to L, sat to EOB with max A to facilitate through trunk as pt pushed up with his L arm. Pt sat EOB statically with close S and then transferred to chair with SB with max facilitation for forward lean as pt slid into chair. Pt then had a significant nose bleed. Nursing arrived and PA notified. Pt was calm and was demonstrating improved posture in chair. When he was complimented on his improved posture, pt joked "I am glad that I am  looking snazzy!"  Therefore improve alertness, social interaction.  Pt provided with breakfast. After sitting for a few minutes, became less alert so needed increased cues and occasional hand over hand assist to self feed with spoon. Nurse tech arrived to take over session.  QRB on pt in chair.  Therapy Documentation Precautions:  Precautions Precautions: Fall Precaution Comments: pt resists movement Restrictions Weight Bearing Restrictions: No    Vital Signs: Therapy Vitals Temp: 98.2 F (36.8 C) Temp src: Oral Pulse Rate: 80 Resp: 19 BP: 156/81 mmHg Patient Position (if appropriate): Lying Oxygen Therapy SpO2: 96 % O2 Device: None (Room air) Pain: Pain Assessment Pain Assessment: No/denies pain ADL: ADL ADL Comments: Total A  See FIM for current functional status  Therapy/Group: Individual Therapy  SAGUIER,JULIA 09/17/2013, 9:25 AM

## 2013-09-17 NOTE — Progress Notes (Signed)
While pt was with OT pt had significant nose bleed. Deatra Inaan Angiulli, PA notified with orders to hold Lovenox this morning. Discussion with pharmacy on dose. Cont. To monitor pt.

## 2013-09-17 NOTE — IPOC Note (Signed)
Overall Plan of Care [facility] [place](IPOC) Patient Details Name: Stormy CardJames A Calma MRN: [id number] DOB: 03/15/1934  Admitting Diagnosis: CVA  Hospital Problems: Active Problems:   * No active hospital problems. *     Functional Problem List: Nursing    PT    OT    SLP    TR         Basic ADL's: OT       Advanced  ADL's: OT       Transfers: PT    OT       Locomotion: PT       Additional Impairments: OT    SLP        TR      Anticipated Outcomes Item Anticipated Outcome  Self Feeding    Swallowing      Basic self-care     Toileting      Bathroom Transfers    Bowel/Bladder     Transfers     Locomotion     Communication     Cognition     Pain     Safety/Judgment      Therapy Plan:             Team Interventions: Nursing Interventions    PT interventions    OT Interventions    SLP Interventions    TR Interventions    SW/CM Interventions      Team Discharge Planning: Destination: PT-  ,OT-   , SLP-  Projected Follow-up: PT- , OT-   , SLP-  Projected Equipment Needs: PT- , OT-  , SLP-  Equipment Details: PT- , OT-  Patient/family involved in discharge planning: PT-  ,  OT- , SLP-   MD ELOS: 21-28 days Medical Rehab Prognosis:  Good Assessment: 80 year old right-handed Caucasian male with history of diabetes mellitus with peripheral neuropathy. Patient independent and active prior to admission. Ad[facility]/2013 with slurred speech as well as diffuse weakness. His blood sugar was in the 130s. EKG showed frequent PVCs and junctional rhythm as well as tachycardia with nonspecific ST-T wave changes. Troponin level 0.02. CT of the head negative for acute changes. MRI of the brain showed multiple areas of acute infarct in the cerebral white matter bilaterally. Echocardiogram with ejection fraction of 60% without thrombi. Chest x-ray negative  Now requiring 24/7 Rehab RN,MD, as well as CIR level PT, OT and SLP.   Treatment team will focus on ADLs and mobility with goals set at min/mod A   See Team Conference Notes for weekly updates to the plan of care

## 2013-09-17 NOTE — Progress Notes (Signed)
Turned frequently during night , with pillows placed behind  back, but still able to lay on back. Buttocks macerated and very excoriated-removed brief & applied barrier cream per orders. Light green drainage noted from buttock. Foam dressing removed on previous shift R/T increased moisture and discharge.  Performed foley cath care. Poor initiation, needs encouragement to drink. Meds given to patient in puree. Bilateral feet with thick dry skin, lotion applied, needs eucerin cream. Dysarthric speech, needing to repeat self frequently. Alfredo MartinezMurray, Jamie A

## 2013-09-17 NOTE — Progress Notes (Signed)
Speech Language Pathology Daily Session Note  Patient Details  Name: Elijah [name] A Kings MRN: [id number] Date of Birth: 12/27/1933  Today's Date: 09/17/2013 Time: [id number] Time Calculation (min): 40 min  Short Term Goals: Week 1: SLP Short Term Goal 1 (Week 1): Pt will utilize recommended strategies with recommended diet with Mod cues SLP Short Term Goal 2 (Week 1): Pt will utilize speech intelligibility strategies at the phrase level with Mod cues SLP Short Term Goal 3 (Week 1): Pt will sustain attention to basic familiar task for 5 minutes with Mod cues SLP Short Term Goal 4 (Week 1): Pt will verbalize at least 1cognitive and 1 physical impairment with Mod cues SLP Short Term Goal 5 (Week 1): Pt will demonstrate basic problem solving during basic, familiar task with Mod cues SLP Short Term Goal 6 (Week 1): Pt will utilize external memory aids to recall new and daily information with Max cues  Skilled Therapeutic Interventions:  Pt was seen for skilled speech therapy targeting basic problem solving and use of external aids for recall.  Upon arrival, pt required max verbal cues and use of external aids aids to orient to place, date, and situation secondary to decreased sustained attention for answering functional questions.  Additionally, pt required mod-max assist verbal cuing for basic problem solving during a structured task that initially involved recreating 3 item patterns and was gradually increased to include 9 item patterns.  As the task complexity increased, pt was noted with longer periods of sustained attention, increasing from approximately 3-5 seconds to 20-30 seconds with mod-max verbal cues for redirection.   Continue per current plan of care.    FIM:  Comprehension Comprehension Mode: Auditory Comprehension: 2-Understands basic 25 - 49% of the time/requires cueing 51 - 75% of the time Expression Expression Mode: Verbal Expression: 2-Expresses basic 25 - 49% of the time/requires  cueing 50 - 75% of the time. Uses single words/gestures. Social Interaction Social Interaction: 3-Interacts appropriately 50 - 74% of the time - May be physically or verbally inappropriate. Problem Solving Problem Solving: 2-Solves basic 25 - 49% of the time - needs direction more than half the time to initiate, plan or complete simple activities Memory Memory: 1-Recognizes or recalls less than 25% of the time/requires cueing greater than 75% of the time  Pain Pain Assessment Pain Assessment: No/denies pain  Therapy/Group: Individual Therapy  Jackalyn LombardNicole Page, M.A. CCC-SLP  Page, Melanee SpryNicole L 09/17/2013, 4:10 PM

## 2013-09-17 NOTE — Progress Notes (Signed)
Pt discovered with nosebleed. Marissa NestlePam Love, PA contacted, order received to hold Lovenox injection for tonight and to start Afrin nasal spray  Each nostril TID x3 days.

## 2013-09-17 NOTE — Progress Notes (Signed)
UNASSIGNED PATIENT Subjective: Patient denies having any abdominal pain, nausea or vomiting. Tolerated his dinner well.  Trending downwards from 09/15/13.   Objective: Vital signs in last 24 hours: Temp:  [97 F (36.1 C)-98.7 F (37.1 C)] 97 F (36.1 C) (06/29 1315) Pulse Rate:  [80-109] 80 (06/29 1315) Resp:  [19-20] 19 (06/29 0611) BP: (118-156)/(61-81) 131/74 mmHg (06/29 1315) SpO2:  [96 %-100 %] 98 % (06/29 1315) Last BM Date: 09/14/13 (per report)  Intake/Output from previous day: 06/28 0701 - 06/29 0700 In: 2220 [P.O.:440; I.V.:530; IV Piggyback:50] Out: 1025 [Urine:1025] Intake/Output this shift: Total I/O In: 360 [P.O.:360] Out: 300 [Urine:300]  General appearance: cooperative, appears older than stated age and no distress Resp: clear to auscultation bilaterally Cardio: regular rate and rhythm, S1, S2 normal, no murmur, click, rub or gallop GI: soft, non-tender; bowel sounds normal; no masses,  no organomegaly  BMET  Recent Labs  09/15/13 0449 09/16/13 0440 09/17/13 0605  NA 157* 156* 154*  K 4.0 3.5* 3.7  CL 121* 120* 119*  CO2 23 24 24   GLUCOSE 281* 151* 116*  BUN 33* 26* 21  CREATININE 1.12 0.91 0.90  CALCIUM 8.9 8.7 8.5   LFT  Recent Labs  09/17/13 0605  PROT 7.8  ALBUMIN 1.8*  AST 363*  ALT 259*  ALKPHOS 1720*  BILITOT 0.6  BILIDIR 0.3  IBILI 0.3   PT/INR  Recent Labs  09/16/13 1355  LABPROT 17.7*  INR 1.46   Studies/Results: [place] Art/ven Flow Abd Pelv Doppler  09/17/2013   CLINICAL DATA:  Elevated LFTs  EXAM: DUPLEX ULTRASOUND OF LIVER  TECHNIQUE: Color and duplex Doppler ultrasound was performed to evaluate the hepatic in-flow and out-flow vessels.  COMPARISON:  CT the abdomen pelvis- 09/12/2013  FINDINGS: The examination is minimally degraded due to patient's inability to breath hold during the examination as well as patient motion artifact.  Portal Vein Velocities  Main:  40.6 cm/sec  Right:  39.8 cm/sec  Left:  32.6 cm/sec  Hepatic  Vein Velocities  Right:  44.8 cm/sec  Middle:  49.2 cm/sec  Left: Suboptimally visualized though appears patent with velocity of 36.2 cm/sec  Hepatic Artery Velocity:  74.8 cm/sec  Splenic Vein Velocity:  28.6 cm/sec  Varices: None visualize  Ascites: None present  Spleen:  The spleen is normal in size measuring 8.7 x 9.5 x 4.4 cm.  IMPRESSION: No explanation for patient's elevated LFTs. Specifically normal acquired directional flow, velocities, and waveforms within the hepatic vascular system.   Electronically Signed   By: Simonne ComeJohn  Watts M.D.   On: 09/17/2013 08:01   Medications: I have reviewed the patient's current medications.  Assessment/Plan: 1) Abnormal LFT's-workup in progress. Will wait for other liver test results and monitor LFT's for now. 2) Hypernatremia.   LOS: 6 days   MANN,JYOTHI 09/17/2013, 3:56 PM

## 2013-09-17 NOTE — Progress Notes (Signed)
Physical Therapy Session Note  Patient Details  Name: Elijah [name] MRN: [id number] Stormy CardDate of Birth: 04/20/1933  Today's Date: 09/17/2013 Time: 1105-1205 ; 1300-1330  Time Calculation (min): 60 min, 30 min  Short Term Goals: Week 1:  PT Short Term Goal 1 (Week 1): pt will roll L with min assist PT Short Term Goal 2 (Week 1): pt will transfer bed >< w/c with assist of 1 person PT Short Term Goal 3 (Week 1): pt will tolerate standing x 5 minutes    Skilled Therapeutic Interventions/Progress Updates:  Pt awake/alert this AM, but initially refusing tx, asking to return to bed. Standing in // attempted, but pt refused.  Sit> stand w/c> STEDY +2 assist, decreasing to +1 mod assist with repetition.  neuromuscular re-education via forced use, manual and VCs for wt shifting, anterior pelvic tilt, bil LE wt bearing.  Pt stood x 10-30 seconds, x 5, attending briefly to card game on table in front of him.  Pt used R hand to turn over 1 card.  He tends to lean forward and R when in standing.  Stedy> bed +3 assist to stabilize Stedy and assist with standing to clear sitting pads/seat.  RN stated that [facility] is [facility] for basic transfers. Rolling R and positioning with pillows to decrease pressure on buttocks.   Tx 2:  Pt asleep, snoring in bed, but easily aroused.  As HOB was raised, pt stated "let's see if I can stand up".   Sit> stand at EOB with mod/max assist of 1, partial pivot before assistance of another person was required.  Roho insert of w/c cushion was somewhat deflated; inflated and adjusted to decrease pressure over pt's buttocks when he is sitting. Pt was positioned with bil hips in neutral rotation with use of towel rolls between L and R thighs and armrests.  Pt was tilted back in w/c to decrease pressure on buttocks; left at nurse's station for supervision with quick release belt in place.     Therapy Documentation Precautions:  Precautions Precautions: Fall Precaution  Comments: pt resists movement Restrictions Weight Bearing Restrictions: No   Vital Signs: Therapy Vitals Pulse Rate: 109 BP: 140/65 mmHg Patient Position (if appropriate): Sitting         See FIM for current functional status  Therapy/Group: Individual Therapy  COOK,CAROLINE 09/17/2013, 12:15 PM

## 2013-09-18 ENCOUNTER — Inpatient Hospital Stay (HOSPITAL_COMMUNITY): Payer: Medicare Other | Admitting: Occupational Therapy

## 2013-09-18 ENCOUNTER — Inpatient Hospital Stay (HOSPITAL_COMMUNITY): Payer: Medicare Other | Admitting: *Deleted

## 2013-09-18 ENCOUNTER — Inpatient Hospital Stay (HOSPITAL_COMMUNITY): Payer: Medicare Other

## 2013-09-18 ENCOUNTER — Inpatient Hospital Stay (HOSPITAL_COMMUNITY): Payer: Medicare Other | Admitting: Speech Pathology

## 2013-09-18 DIAGNOSIS — I6340 Cerebral infarction due to embolism of unspecified cerebral artery: Secondary | ICD-10-CM

## 2013-09-18 LAB — HEPATIC FUNCTION PANEL
ALT: 218 U/L — ABNORMAL HIGH (ref 0–53)
AST: 301 U/L — ABNORMAL HIGH (ref 0–37)
Albumin: 1.7 g/dL — ABNORMAL LOW (ref 3.5–5.2)
Alkaline Phosphatase: 1688 U/L — ABNORMAL HIGH (ref 39–117)
Bilirubin, Direct: 0.3 mg/dL (ref 0.0–0.3)
Indirect Bilirubin: 0.3 mg/dL (ref 0.3–0.9)
Total Bilirubin: 0.6 mg/dL (ref 0.3–1.2)
Total Protein: 7.5 g/dL (ref 6.0–8.3)

## 2013-09-18 LAB — GLUCOSE, CAPILLARY
Glucose-Capillary: 144 mg/dL — ABNORMAL HIGH (ref 70–99)
Glucose-Capillary: 227 mg/dL — ABNORMAL HIGH (ref 70–99)
Glucose-Capillary: 216 mg/dL — ABNORMAL HIGH (ref 70–99)
Glucose-Capillary: 316 mg/dL — ABNORMAL HIGH (ref 70–99)

## 2013-09-18 LAB — ANTI-MICROSOMAL ANTIBODY LIVER / KIDNEY: Liver-Kidney Microsomal Ab: <=20 U (ref <=20.0)

## 2013-09-18 MED ORDER — COLLAGENASE 250 UNIT/GM EX OINT
TOPICAL_OINTMENT | Freq: Every day | CUTANEOUS | Status: DC
  Administered 2013-09-18 – 2013-10-03 (×16): via TOPICAL
  Filled 2013-09-18 (×3): qty 30

## 2013-09-18 NOTE — Progress Notes (Signed)
Subjective: No complaints.  Objective: Vital signs in last 24 hours: Temp:  [97.5 F (36.4 C)-98.3 F (36.8 C)] 98.3 F (36.8 C) (06/30 1603) Pulse Rate:  [72-85] 72 (06/30 1603) Resp:  [18] 18 (06/30 1603) BP: (97-144)/(54-83) 125/77 mmHg (06/30 1603) SpO2:  [97 %-98 %] 98 % (06/30 1603) Last BM Date: 09/18/13  Intake/Output from previous day: 06/29 0701 - 06/30 0700 In: 660 [P.O.:660] Out: 1450 [Urine:1450] Intake/Output this shift: Total I/O In: 540 [P.O.:540] Out: -   General appearance: alert and no distress GI: soft, non-tender; bowel sounds normal; no masses,  no organomegaly  Lab Results: No results found for this basename: WBC, HGB, HCT, PLT,  in the last 72 hours BMET  Recent Labs  09/16/13 0440 09/17/13 0605  NA 156* 154*  K 3.5* 3.7  CL 120* 119*  CO2 24 24  GLUCOSE 151* 116*  BUN 26* 21  CREATININE 0.91 0.90  CALCIUM 8.7 8.5   LFT  Recent Labs  09/18/13 1631  PROT 7.5  ALBUMIN 1.7*  AST 301*  ALT 218*  ALKPHOS 1688*  BILITOT 0.6  BILIDIR 0.3  IBILI 0.3   PT/INR  Recent Labs  09/16/13 1355  LABPROT 17.7*  INR 1.46   Hepatitis Panel No results found for this basename: HEPBSAG, HCVAB, HEPAIGM, HEPBIGM,  in the last 72 hours C-Diff No results found for this basename: CDIFFTOX,  in the last 72 hours Fecal Lactopherrin No results found for this basename: FECLLACTOFRN,  in the last 72 hours  Studies/Results: No results found.  Medications:  Scheduled: . amLODipine  10 mg Oral Daily  . antiseptic oral rinse  15 mL Mouth Rinse q12n4p  .  ceFAZolin (ANCEF) IV  2 g Intravenous 3 times per day  . chlorhexidine  15 mL Mouth Rinse BID  . collagenase   Topical Daily  . enoxaparin (LOVENOX) injection  85 mg Subcutaneous Q12H  . feeding supplement (ENSURE)  1 Container Oral TID BM  . fluconazole  100 mg Oral Daily  . hydrALAZINE  10 mg Oral 4 times per day  . insulin aspart  0-15 Units Subcutaneous TID WC  . insulin aspart  0-5 Units  Subcutaneous QHS  . insulin detemir  25 Units Subcutaneous QHS  . metoprolol tartrate  25 mg Oral 3 times per day  . oxymetazoline  1 spray Each Nare TID   Continuous: . 0.45 % NaCl with KCl 20 mEq / L 75 mL/hr at 09/18/13 1707    Assessment/Plan: 1) Abnormal liver enzymes. 2) Elevated IgG with negative ANA and AMA. 3) CVA.   He appears to be making progress from his CVA.  His AP has decreased from 1700.  Pending the trend of his AP, i.e., if he does not have any significant drop or a trend downwards, a liver biopsy will be required.  Plan: 1) Follow liver panel.   LOS: 7 days   HUNG,PATRICK D 09/18/2013, 5:45 PM

## 2013-09-18 NOTE — Progress Notes (Signed)
Physical Therapy Session Note  Patient Details  Name: Elijah CardJames A Tischer MRN: [id number] Date of Birth: 10/14/1933  Today's Date: 09/18/2013 Time: 0900-1000 Time Calculation (min): 60 min  Short Term Goals: Week 2:     Skilled Therapeutic Interventions/Progress Updates:    Tx 1: Pt received edge of bed with nursing staff, eating breakfast, urine catheter and IV in place. Pt denies pain and agrees to participate in PT session.   Neuromuscular Reeducation: 40 minutes PT instructs pt in static and dynamic sitting balance edge of bed while eating breakfast, cues to hold L bedrail with L UE and frequently, repeated cues to lean forward to weight shift over center of gravity. Pt req min A --> mod A with fatigue for sitting balance due to retropulsive posture. Pt spends 20 minutes eating pureed breakfast and thickened liquids with R UE using spoon req occasional assist with cup and stabilizing containers. In gym, PT instructs pt in forward weight shift in w/c with tactile cues for pt's B hand placement and verbal cues to whisper in PT's ear, while focusing on attention to task and increasing excursion and duration of forward lean.   Therapeutic Activity: 20 minutes PT instructs pt to scoot forward on edge of bed to get feet flat/supported, req mod A. PT instructs pt in squat-pivot transfer +2 max A for bed to w/c transfer to R, verbal cues for sequencing and tactile cues for hand placement. PT wheels pt to gym, dependently, with plans to work in standing frame, but pt's systolic BP is <100 mmHg, and so remainder of PT session is done with pt in semi-reclined position in w/c or upright in w/c. See Doc Flowsheet for BP readings.   Pt continues to demonstrate lethargy, poor attention to task, impaired coordination, and heavy dependence on CG for all functional mobility. Pt's sitting balance edge of bed appears to be improving for functional tasks, such as eating, but pt fatigues quickly and need for assist  increases. Pt's BP was low while sitting up in w/c, and so standing activities in standing frame were deferred.    Pt left fully reclined in tilt in space w/c at RN station with brakes on. Nurse Tech agrees to obtain quick release belt immediately for pt safety. Pt asleep in w/c as PT left.   Therapy Documentation Precautions:  Precautions Precautions: Fall Precaution Comments: pt resists movement (posterior lean) Restrictions Weight Bearing Restrictions: No General:   Vital Signs: Therapy Vitals BP: 98/55 mmHg Patient Position (if appropriate): Sitting Pain: Pain Assessment Pain Assessment: 0-10 Pain Score: 0-No pain Mobility: Bed Mobility Bed Mobility: Not assessed Transfers Transfers: Yes Squat Pivot Transfers: 1: +2 Total assist;With upper extremity assistance Squat Pivot Transfer Details: Tactile cues for placement;Verbal cues for sequencing;Verbal cues for technique;Verbal cues for safe use of DME/AE;Manual facilitation for weight bearing Squat Pivot Transfer Details (indicate cue type and reason): cues for R hand placement and 3 parts of squat-pivot transfer Locomotion : Ambulation Ambulation: No Gait Gait: No Stairs / Additional Locomotion Stairs: No Wheelchair Mobility Wheelchair Mobility: No  Trunk/Postural Assessment :    Balance: Balance Balance Assessed: Yes Static Sitting Balance Static Sitting - Balance Support: Left upper extremity supported;Feet supported Static Sitting - Level of Assistance: 3: Mod assist Static Sitting - Comment/# of Minutes: 20 minutes on edge of bed: min-mod A with fatigue; retropulsive  See FIM for current functional status  Therapy/Group: Individual Therapy  HYSLOP,AMANDA M 09/18/2013, 10:39 AM

## 2013-09-18 NOTE — Progress Notes (Signed)
Subjective/Complaints: No abd pain, no nausea or vomiting No nausea or vomiting Confused oriented to self (unchanged vs admit) Appreciate GI notes Reviewed LFTs which are still elevated  Review of Systems - limited secondary to mental status Objective: Vital Signs: Blood pressure 144/83, pulse 82, temperature 97.5 F (36.4 C), temperature source Oral, resp. rate 18, weight 85.594 kg (188 lb 11.2 oz), SpO2 98.00%. [place] Art/ven Flow Abd Pelv Doppler  09/17/2013   CLINICAL DATA:  Elevated LFTs  EXAM: DUPLEX ULTRASOUND OF LIVER  TECHNIQUE: Color and duplex Doppler ultrasound was performed to evaluate the hepatic in-flow and out-flow vessels.  COMPARISON:  CT the abdomen pelvis- 09/12/2013  FINDINGS: The examination is minimally degraded due to patient's inability to breath hold during the examination as well as patient motion artifact.  Portal Vein Velocities  Main:  40.6 cm/sec  Right:  39.8 cm/sec  Left:  32.6 cm/sec  Hepatic Vein Velocities  Right:  44.8 cm/sec  Middle:  49.2 cm/sec  Left: Suboptimally visualized though appears patent with velocity of 36.2 cm/sec  Hepatic Artery Velocity:  74.8 cm/sec  Splenic Vein Velocity:  28.6 cm/sec  Varices: None visualize  Ascites: None present  Spleen:  The spleen is normal in size measuring 8.7 x 9.5 x 4.4 cm.  IMPRESSION: No explanation for patient's elevated LFTs. Specifically normal acquired directional flow, velocities, and waveforms within the hepatic vascular system.   Electronically Signed   By: Sandi Mariscal M.D.   On: 09/17/2013 08:01   Results for orders placed during the hospital encounter of 09/11/13 (from the past 72 hour(s))  GLUCOSE, CAPILLARY     Status: Abnormal   Collection Time    09/15/13 11:32 AM      Result Value Ref Range   Glucose-Capillary 230 (*) 70 - 99 mg/dL   Comment 1 Notify RN    GLUCOSE, CAPILLARY     Status: Abnormal   Collection Time    09/15/13  4:25 PM      Result Value Ref Range   Glucose-Capillary 237 (*) 70 - 99  mg/dL   Comment 1 Notify RN    GLUCOSE, CAPILLARY     Status: Abnormal   Collection Time    09/15/13  9:21 PM      Result Value Ref Range   Glucose-Capillary 141 (*) 70 - 99 mg/dL   Comment 1 Notify RN    BASIC METABOLIC PANEL     Status: Abnormal   Collection Time    09/16/13  4:40 AM      Result Value Ref Range   Sodium 156 (*) 137 - 147 mEq/L   Potassium 3.5 (*) 3.7 - 5.3 mEq/L   Chloride 120 (*) 96 - 112 mEq/L   CO2 24  19 - 32 mEq/L   Glucose, Bld 151 (*) 70 - 99 mg/dL   BUN 26 (*) 6 - 23 mg/dL   Creatinine, Ser 0.91  0.50 - 1.35 mg/dL   Calcium 8.7  8.4 - 10.5 mg/dL   GFR calc non Af Amer 80 (*) >90 mL/min   GFR calc Af Amer >90  >90 mL/min   Comment: (NOTE)     The eGFR has been calculated using the CKD EPI equation.     This calculation has not been validated in all clinical situations.     eGFR's persistently <90 mL/min signify possible Chronic Kidney     Disease.  GLUCOSE, CAPILLARY     Status: Abnormal   Collection Time  09/16/13  7:10 AM      Result Value Ref Range   Glucose-Capillary 141 (*) 70 - 99 mg/dL   Comment 1 Notify RN    GLUCOSE, CAPILLARY     Status: Abnormal   Collection Time    09/16/13 11:43 AM      Result Value Ref Range   Glucose-Capillary 117 (*) 70 - 99 mg/dL   Comment 1 Notify RN    ANA     Status: None   Collection Time    09/16/13  1:55 PM      Result Value Ref Range   ANA NEGATIVE  NEGATIVE   Comment: Performed at [facility]: None   Collection Time    09/16/13  1:55 PM      Result Value Ref Range   Mitochondrial M2 Ab, IgG 0.39  <0.91   Comment: (NOTE)     Result        Interpretation     ---------     -----------------------------------------------     <0.91         Negative     0.91-1.09     Equivocal     >=1.10        Positive     Performed at [facility]  HEPATIC FUNCTION PANEL     Status: Abnormal   Collection Time    09/16/13  1:55 PM      Result Value Ref  Range   Total Protein 7.6  6.0 - 8.3 g/dL   Albumin 1.8 (*) 3.5 - 5.2 g/dL   AST 251 (*) 0 - 37 U/L   ALT 198 (*) 0 - 53 U/L   Alkaline Phosphatase 1534 (*) 39 - 117 U/L   Total Bilirubin 0.7  0.3 - 1.2 mg/dL   Bilirubin, Direct 0.3  0.0 - 0.3 mg/dL   Indirect Bilirubin 0.4  0.3 - 0.9 mg/dL  PROTIME-INR     Status: Abnormal   Collection Time    09/16/13  1:55 PM      Result Value Ref Range   Prothrombin Time 17.7 (*) 11.6 - 15.2 seconds   INR 1.46  0.00 - 1.49  GLUCOSE, CAPILLARY     Status: Abnormal   Collection Time    09/16/13  5:22 PM      Result Value Ref Range   Glucose-Capillary 113 (*) 70 - 99 mg/dL   Comment 1 Notify RN    GLUCOSE, CAPILLARY     Status: Abnormal   Collection Time    09/16/13  8:48 PM      Result Value Ref Range   Glucose-Capillary 310 (*) 70 - 99 mg/dL   Comment 1 Notify RN    BASIC METABOLIC PANEL     Status: Abnormal   Collection Time    09/17/13  6:05 AM      Result Value Ref Range   Sodium 154 (*) 137 - 147 mEq/L   Potassium 3.7  3.7 - 5.3 mEq/L   Chloride 119 (*) 96 - 112 mEq/L   CO2 24  19 - 32 mEq/L   Glucose, Bld 116 (*) 70 - 99 mg/dL   BUN 21  6 - 23 mg/dL   Creatinine, Ser 0.90  0.50 - 1.35 mg/dL   Calcium 8.5  8.4 - 10.5 mg/dL   GFR calc non Af Amer 80 (*) >90 mL/min   GFR calc Af Amer >90  >90 mL/min  Comment: (NOTE)     The eGFR has been calculated using the CKD EPI equation.     This calculation has not been validated in all clinical situations.     eGFR's persistently <90 mL/min signify possible Chronic Kidney     Disease.  HEPATIC FUNCTION PANEL     Status: Abnormal   Collection Time    09/17/13  6:05 AM      Result Value Ref Range   Total Protein 7.8  6.0 - 8.3 g/dL   Albumin 1.8 (*) 3.5 - 5.2 g/dL   AST 363 (*) 0 - 37 U/L   ALT 259 (*) 0 - 53 U/L   Alkaline Phosphatase 1720 (*) 39 - 117 U/L   Total Bilirubin 0.6  0.3 - 1.2 mg/dL   Bilirubin, Direct 0.3  0.0 - 0.3 mg/dL   Indirect Bilirubin 0.3  0.3 - 0.9 mg/dL   GLUCOSE, CAPILLARY     Status: Abnormal   Collection Time    09/17/13  7:51 AM      Result Value Ref Range   Glucose-Capillary 108 (*) 70 - 99 mg/dL  GLUCOSE, CAPILLARY     Status: Abnormal   Collection Time    09/17/13 12:03 PM      Result Value Ref Range   Glucose-Capillary 143 (*) 70 - 99 mg/dL  GLUCOSE, CAPILLARY     Status: Abnormal   Collection Time    09/17/13  4:11 PM      Result Value Ref Range   Glucose-Capillary 190 (*) 70 - 99 mg/dL  WOUND CULTURE     Status: None   Collection Time    09/17/13  5:30 PM      Result Value Ref Range   Specimen Description WOUND     Special Requests SACRUM     Gram Stain PENDING     Culture       Value: Culture reincubated for better growth     Performed at [facility]   Report Status PENDING    GLUCOSE, CAPILLARY     Status: Abnormal   Collection Time    09/17/13  9:14 PM      Result Value Ref Range   Glucose-Capillary 259 (*) 70 - 99 mg/dL  GLUCOSE, CAPILLARY     Status: Abnormal   Collection Time    09/18/13  7:07 AM      Result Value Ref Range   Glucose-Capillary 144 (*) 70 - 99 mg/dL   Comment 1 Notify RN       HEENT: normal Cardio: irregular and no murmurs Resp: CTA B/L and unlabored GI: BS positive and large bilateral inguinal mass, nontender Extremity:  No Edema  Neuro: Confused, Abnormal Sensory cannot assess secondary to mental status, Abnormal Motor 4/5 in BUE, 3/5 in BLE, Abnormal FMC Ataxic/ dec FMC, Dysarthric and Apraxic Musc/Skel:  Normal Gen NAD   Assessment/Plan: 1. Functional deficits secondary to bilateral cerebral infarct embolic which require 3+ hours per day of interdisciplinary therapy in a comprehensive inpatient rehab setting. Physiatrist is providing close team supervision and 24 hour management of active medical problems listed below. Physiatrist and rehab team continue to assess barriers to discharge/monitor patient progress toward functional and medical goals.  FIM: FIM -  Bathing Bathing Steps Patient Completed: Chest Bathing: 0: Activity did not occur  FIM - Upper Body Dressing/Undressing Upper body dressing/undressing steps patient completed: Thread/unthread right sleeve of pullover shirt/dresss;Thread/unthread left sleeve of pullover shirt/dress Upper body dressing/undressing: 0: Activity did  not occur FIM - Lower Body Dressing/Undressing Lower body dressing/undressing: 1: Two helpers  FIM - Toileting Toileting: 0: Activity did not occur  FIM - Radio producer Devices: Bedside commode;Grab bars Toilet Transfers: 0-Activity did not occur  FIM - Control and instrumentation engineer Devices: Bed rails;HOB elevated Bed/Chair Transfer: 3: Supine > Sit: Mod A (lifting assist/Pt. 50-74%/lift 2 legs;1: Two helpers  FIM - Locomotion: Wheelchair Locomotion: Wheelchair: 1: Total Assistance/staff pushes wheelchair (Pt<25%) FIM - Locomotion: Ambulation Locomotion: Ambulation Assistive Devices:  (unable to safely perform) Locomotion: Ambulation: 0: Activity did not occur  Comprehension Comprehension Mode: Auditory Comprehension: 2-Understands basic 25 - 49% of the time/requires cueing 51 - 75% of the time  Expression Expression Mode: Verbal Expression: 2-Expresses basic 25 - 49% of the time/requires cueing 50 - 75% of the time. Uses single words/gestures.  Social Interaction Social Interaction: 2-Interacts appropriately 25 - 49% of time - Needs frequent redirection.  Problem Solving Problem Solving: 2-Solves basic 25 - 49% of the time - needs direction more than half the time to initiate, plan or complete simple activities  Memory Memory: 1-Recognizes or recalls less than 25% of the time/requires cueing greater than 75% of the time  Medical Problem List and Plan:  1. Functional deficits secondary to acute cerebral white matter bilateral infarct  2. DVT Prophylaxis/Anticoagulation: Eliquis. Monitor for any  bleeding episodes  3. Pain Management: Tylenol as needed  4. Dysphagia with decreased nutritional storage. Dysphagia 1 nectar liquids. Followup speech therapy monitor hydration. Consider appetite stimulant  5. Neuropsych: This patient is not capable of making decisions on his own behalf.  6. Hypertension/atrial fibrillation. Norvasc 10 mg daily, hydralazine 10 mg 4 times a day, metoprolol 25 mg every 8 hours. Monitor with increased mobility  7 ID/methicillin sensitive staph aureus. Continue cefazolin 2 g intravenously every 8 hours x4 weeks total initiated 08/29/2013. Monitor for any fever  8. Diabetes mellitus with peripheral neuropathy. Check CBGs a.c. and at bedtime. Levemir 20 units each bedtime.  9. Integumentary: local skin care, turning, maintain foley for now, mattress overlay, WOC, Diflucan for yeast 10.  Inguinal mass, bilateral hernia, obviously chronic and not incarcerated, given recent CVA, no elective surg recommended at this time 11.  Elevated alk phos, AST,ALT and lipase, stop Lipitor, appreciate GI eval, ? lipitor vs autoimmune with increased IgG and IgA, liver ultrasound OK LOS (Days) 7 A FACE TO FACE EVALUATION WAS PERFORMED  KIRSTEINS,ANDREW E 09/18/2013, 8:40 AM

## 2013-09-18 NOTE — Progress Notes (Signed)
Occupational Therapy Session Note  Patient Details  Name: Elijah [name] MRN: [id number] Date of Birth: 08/30/1933  Today's Date: 09/18/2013 Time: 1430-1500 Time Calculation (min): 30 min  Short Term Goals: Week 1:  OT Short Term Goal 1 (Week 1): Pt will be able to sit EOB with mod A x 1 for 10 min to engage in UB bathing and dressing. OT Short Term Goal 2 (Week 1): Pt will be able to bathe his UB with min A. OT Short Term Goal 3 (Week 1): Pt will be able to don a tshirt with mod A. OT Short Term Goal 4 (Week 1): Pt will be able to transfer to w/c from bed with max A x1. OT Short Term Goal 5 (Week 1): Pt will demonstrate improved direction following during simple grooming with min cues.  Skilled Therapeutic Interventions/Progress Updates: ADL-retraining with focus on bed mobility, transfer, seated grooming.  Patient received supine in bed, asleep.   With min stimulation to arouse, patient engaged in brief conversation relating to LUE function and then agreed to participate in seated ADL at sink to shave.   Patient completed bed mobility with mod-max assist to rise from supine to sitting at edge of bed due to low air-loss mattress and patient reporting discomfort at buttocks.   Patient completed squat pivot transfer to w/c with mod assist and facilitation to weight-shift forward during pivot.   Pt required min assist to reposition himself in w/c while at sink.   Patient prepared his face with min facilitation to focus attention and sequence rinsing washcloth and applying it to his face.  Patient was able to follow cues to reposition his head during assisted shaving 50% of the time before falling back to sleep.  Patient left in w/c at RN station.       Therapy Documentation Precautions:  Precautions Precautions: Fall Precaution Comments: pt resists movement (posterior lean) Restrictions Weight Bearing Restrictions: No  Vital Signs: Therapy Vitals Pulse Rate: 78 BP: 119/72 mmHg Patient  Position (if appropriate): Lying  Pain: Pain Assessment Pain Assessment: Faces Faces Pain Scale: Hurts even more Pain Type: Acute pain Pain Location: Buttocks Pain Descriptors / Indicators: Aching Pain Onset: Gradual Pain Intervention(s): RN made aware;Repositioned Multiple Pain Sites: No  ADL: ADL ADL Comments: Total A  See FIM for current functional status  Therapy/Group: Individual Therapy  BARTHOLD,FRANK 09/18/2013, 3:16 PM

## 2013-09-18 NOTE — Progress Notes (Signed)
Speech Language Pathology Daily Session Note  Patient Details  Name: Elijah [name] MRN: [id number] Date of Birth: 07/31/1933  Today's Date: 09/18/2013 Time: 1030-1130 Time Calculation (min): 60 min  Short Term Goals: Week 1: SLP Short Term Goal 1 (Week 1): Pt will utilize recommended strategies with recommended diet with Mod cues SLP Short Term Goal 2 (Week 1): Pt will utilize speech intelligibility strategies at the phrase level with Mod cues SLP Short Term Goal 3 (Week 1): Pt will sustain attention to basic familiar task for 5 minutes with Mod cues SLP Short Term Goal 4 (Week 1): Pt will verbalize at least 1cognitive and 1 physical impairment with Mod cues SLP Short Term Goal 5 (Week 1): Pt will demonstrate basic problem solving during basic, familiar task with Mod cues SLP Short Term Goal 6 (Week 1): Pt will utilize external memory aids to recall new and daily information with Max cues  Skilled Therapeutic Interventions: Skilled treatment session focused on addressing cognitive goals. SLP received from RN station in wheelchair and was awake and participatory for ~10-12 minutes increments with Max cues for sustained attention to task.  During times of reduce cues patient easily drifted off to sleep and was provided with short rest periods x2 during session.  Patient verbalized orientation with Mod question cues.  Patient also participated in task that required him to compare and contrast card values, which he did with Mod cues.  Patient reported fatigue at end of session as requested to go back to bed; patient followed 1-step simple commands during transfer with Mod verbal and contextual cues.  Continue with current plan of care.    FIM:  Comprehension Comprehension Mode: Auditory Comprehension: 3-Understands basic 50 - 74% of the time/requires cueing 25 - 50%  of the time Expression Expression Mode: Verbal Expression: 3-Expresses basic 50 - 74% of the time/requires cueing 25 - 50% of  the time. Needs to repeat parts of sentences. Social Interaction Social Interaction: 3-Interacts appropriately 50 - 74% of the time - May be physically or verbally inappropriate. Problem Solving Problem Solving: 2-Solves basic 25 - 49% of the time - needs direction more than half the time to initiate, plan or complete simple activities Memory Memory: 2-Recognizes or recalls 25 - 49% of the time/requires cueing 51 - 75% of the time FIM - Eating Eating Activity: 4: Help with managing cup/glass  Pain Pain Assessment Pain Assessment: Faces Pain Score: 0-No pain Faces Pain Scale: Hurts even more Pain Type: Acute pain Pain Location: Buttocks Pain Descriptors / Indicators: Aching Pain Onset: Gradual Pain Intervention(s): RN made aware;Repositioned Multiple Pain Sites: No  Therapy/Group: Individual Therapy  Elijah [name], M.A., CCC-SLP [phone]  Heron Pitcock 09/18/2013, 1:16 PM

## 2013-09-18 NOTE — Progress Notes (Signed)
Occupational Therapy Session Note  Patient Details  Name: Elijah [name] MRN: [id number] Date of Birth: 11/30/1933  Today's Date: 09/18/2013 Time: 0800-0900 Time Calculation (min): 60 min  Short Term Goals: Week 1:  OT Short Term Goal 1 (Week 1): Pt will be able to sit EOB with mod A x 1 for 10 min to engage in UB bathing and dressing. OT Short Term Goal 2 (Week 1): Pt will be able to bathe his UB with min A. OT Short Term Goal 3 (Week 1): Pt will be able to don a tshirt with mod A. OT Short Term Goal 4 (Week 1): Pt will be able to transfer to w/c from bed with max A x1. OT Short Term Goal 5 (Week 1): Pt will demonstrate improved direction following during simple grooming with min cues.  Skilled Therapeutic Interventions/Progress Updates:  Patient resting in bed upon arrival.  Engaged in self care retraining to include sponge bath and dressing.  Focused session on activity tolerance, alertness, participation and engagement, following 1 step commands, static and dynamic sitting and standing balance.  Patient performed LB bath in bed then UB bath and dress sitting EOB.  Patient stood with +2 to pull up pants then sat EOB to eat breakfast with RN present.  Therapy Documentation Precautions:  Precautions Precautions: Fall Precaution Comments: pt resists movement Restrictions Weight Bearing Restrictions: No Pain: Denies pain ADL: See FIM for current functional status  Therapy/Group: Individual Therapy  SHAFFER, CHRISTINA 09/18/2013, 9:18 AM

## 2013-09-18 NOTE — Progress Notes (Signed)
NUTRITION FOLLOW UP  DOCUMENTATION CODES Per approved criteria  -Severe malnutrition in the context of acute illness or injury   INTERVENTION:  Continue Ensure Pudding po TID, each supplement provides 170 kcal and 4 grams of protein  Consider appetite stimulant  RD to follow for nutrition care plan  NUTRITION DIAGNOSIS: Inadequate oral intake related to CVA as evidenced by 10% meal intake, ongoing  Goal: Patient will meet >/=90% of estimated nutrition needs, progressing  Monitor:  PO & supplemental intake, weight, labs, I/O's  ASSESSMENT: 80 year old right-handed Caucasian male with history of diabetes mellitus with peripheral neuropathy. Patient independent and active prior to admission. Ad[facility] to [facility] 08/27/2013 with slurred speech as well as diffuse weakness.  Patient continues on a Dys 1, nectar-thick liquid diet.  PO intake remains variable at 0-90% per flowsheet records.  Overall improved.  Taking his Ensure Pudding supplements.  CWOCN follow up note reviewed 6/30.  2 areas with MASD to upper buttocks have declined.    GI following for elevated AP/AST/ALT.  Height: Ht Readings from Last 1 Encounters:  09/11/13 6\' 2"  (1.88 m)    Weight: -----> trending down Wt Readings from Last 1 Encounters:  09/12/13 188 lb 11.2 oz (85.594 kg)    6/23  198 lb 6/19  207 lb  BMI:  Body mass index is 24.22 kg/(m^2).  Estimated Nutritional Needs: Kcal: 2050-2200 kcal Protein: 110-125 g Fluid: >2.7 L/day  Skin: Excoriated area on buttocks/sacrum, deep tissue injury right heel  Diet Order: Dysphagia 1, nectar thick   Intake/Output Summary (Last 24 hours) at 09/18/13 1338 Last data filed at 09/18/13 1323  Gross per 24 hour  Intake   1080 ml  Output   1150 ml  Net    -70 ml    Labs:   Recent Labs Lab 09/15/13 0449 09/16/13 0440 09/17/13 0605  NA 157* 156* 154*  K 4.0 3.5* 3.7  CL 121* 120* 119*  CO2 23 24 24   BUN 33* 26* 21   CREATININE 1.12 0.91 0.90  CALCIUM 8.9 8.7 8.5  GLUCOSE 281* 151* 116*    CBG (last 3)   Recent Labs  09/17/13 2114 09/18/13 0707 09/18/13 1205  GLUCAP 259* 144* 227*    Scheduled Meds: . amLODipine  10 mg Oral Daily  . antiseptic oral rinse  15 mL Mouth Rinse q12n4p  .  ceFAZolin (ANCEF) IV  2 g Intravenous 3 times per day  . chlorhexidine  15 mL Mouth Rinse BID  . collagenase   Topical Daily  . enoxaparin (LOVENOX) injection  85 mg Subcutaneous Q12H  . feeding supplement (ENSURE)  1 Container Oral TID BM  . fluconazole  100 mg Oral Daily  . hydrALAZINE  10 mg Oral 4 times per day  . insulin aspart  0-15 Units Subcutaneous TID WC  . insulin aspart  0-5 Units Subcutaneous QHS  . insulin detemir  25 Units Subcutaneous QHS  . metoprolol tartrate  25 mg Oral 3 times per day  . oxymetazoline  1 spray Each Nare TID    Continuous Infusions: . 0.45 % NaCl with KCl 20 mEq / L 75 mL/hr at 09/18/13 0324    Maureen ChattersKatie Lamberton, RD, LDN Pager #: [phone] After-Hours Pager #: [phone]

## 2013-09-18 NOTE — Consult Note (Addendum)
WOC wound follow-up consult note Buttocks with moisture-associated skin damage which has not improved despite plan of care.  Scattered areas of partial thickness skin damage across bilat buttocks to area 10X10cm.  Red moist wound beds which bleed easily when touched.  Pt is frequently incontinent of stool and difficult to keep wounds from becoming soiled when previously covered by foam dressing. All preventive measures have been in place; air mattress replacement is on bed to reduce pressure, barrier cream to protect skin and repel moisture, repositioned frequently.  Nurse reported green drainage from site this weekend and wound culture was sent; results are pending.  2 areas to upper buttocks have declined and are 100% yellow slough; .2X2X.1cm and .2X3X.1cm.   Plan:  Begin Santyl for chemical debridement of nonviable tissue to upper buttocks wounds.  If wound culture is positive, then primary team can order antibiotics if desired. Cammie Mcgeeawn Engels MSN, RN, CWOCN, [place], CNS [phone]

## 2013-09-18 NOTE — Plan of Care (Signed)
Problem: RH SKIN INTEGRITY Goal: RH STG MAINTAIN SKIN INTEGRITY WITH ASSISTANCE STG Maintain Skin Integrity With max Assistance.  Outcome: Not Progressing Pt turning q2 hrs by staff with pt turning back to supine position. Pt remains incont. Of bowel.

## 2013-09-18 NOTE — Plan of Care (Signed)
Problem: RH SKIN INTEGRITY Goal: RH STG SKIN FREE OF INFECTION/BREAKDOWN Pt will remain free of infection and further skin breakdown with max assistance  Outcome: Not Progressing Pt on overlay mattress. Turning pt q2 hrs with pt continuing to turn back supine.

## 2013-09-18 NOTE — Progress Notes (Signed)
ANTICOAGULATION CONSULT NOTE - Follow Up Consult  Pharmacy Consult for lovenox Indication: atrial fibrillation  No Known Allergies  Patient Measurements: Weight: 188 lb 11.2 oz (85.594 kg) Heparin Dosing Weight:   Vital Signs: Temp: 97.5 F (36.4 C) (06/29 2100) Temp src: Oral (06/29 2100) BP: 144/83 mmHg (06/30 0500) Pulse Rate: 82 (06/30 0500)  Labs:  Recent Labs  09/16/13 0440 09/16/13 1355 09/17/13 0605  LABPROT  --  17.7*  --   INR  --  1.46  --   CREATININE 0.91  --  0.90    The CrCl is unknown because both a height and weight (above a minimum accepted value) are required for this calculation.   Medications:  Scheduled:  . amLODipine  10 mg Oral Daily  . antiseptic oral rinse  15 mL Mouth Rinse q12n4p  .  ceFAZolin (ANCEF) IV  2 g Intravenous 3 times per day  . chlorhexidine  15 mL Mouth Rinse BID  . collagenase   Topical Daily  . enoxaparin (LOVENOX) injection  85 mg Subcutaneous Q12H  . feeding supplement (ENSURE)  1 Container Oral TID BM  . fluconazole  100 mg Oral Daily  . hydrALAZINE  10 mg Oral 4 times per day  . insulin aspart  0-15 Units Subcutaneous TID WC  . insulin aspart  0-5 Units Subcutaneous QHS  . insulin detemir  25 Units Subcutaneous QHS  . metoprolol tartrate  25 mg Oral 3 times per day  . oxymetazoline  1 spray Each Nare TID   Infusions:  . 0.45 % NaCl with KCl 20 mEq / L 75 mL/hr at 09/18/13 [id number]    Assessment: 80 yo male with afib is currently on treatment dose of lovenox.  No lab today. Goal of Therapy:  Anti-Xa level 0.6-1 units/ml 4hrs after LMWH dose given Monitor platelets by anticoagulation protocol: Yes   Plan:  1) Continue lovenox at 85 mg sq q12h. 2) CBC every 72 hours 3) f/u plan on oral anticoagulation when LFT is corrected  So, Tsz-Yin 09/18/2013,8:13 AM

## 2013-09-19 ENCOUNTER — Inpatient Hospital Stay (HOSPITAL_COMMUNITY): Payer: Medicare Other

## 2013-09-19 ENCOUNTER — Inpatient Hospital Stay (HOSPITAL_COMMUNITY): Payer: Medicare Other | Admitting: Occupational Therapy

## 2013-09-19 ENCOUNTER — Inpatient Hospital Stay (HOSPITAL_COMMUNITY): Payer: Medicare Other | Admitting: Speech Pathology

## 2013-09-19 LAB — GLUCOSE, CAPILLARY
Glucose-Capillary: 66 mg/dL — ABNORMAL LOW (ref 70–99)
Glucose-Capillary: 81 mg/dL (ref 70–99)
Glucose-Capillary: 242 mg/dL — ABNORMAL HIGH (ref 70–99)
Glucose-Capillary: 241 mg/dL — ABNORMAL HIGH (ref 70–99)
Glucose-Capillary: 211 mg/dL — ABNORMAL HIGH (ref 70–99)

## 2013-09-19 LAB — CBC
HCT: 27.9 % — ABNORMAL LOW (ref 39.0–52.0)
Hemoglobin: 8.7 g/dL — ABNORMAL LOW (ref 13.0–17.0)
MCH: 30.3 pg (ref 26.0–34.0)
MCHC: 31.2 g/dL (ref 30.0–36.0)
MCV: 97.2 fL (ref 78.0–100.0)
Platelets: 242 10*3/uL (ref 150–400)
RBC: 2.87 MIL/uL — ABNORMAL LOW (ref 4.22–5.81)
RDW: 16.2 % — ABNORMAL HIGH (ref 11.5–15.5)
WBC: 12 10*3/uL — ABNORMAL HIGH (ref 4.0–10.5)

## 2013-09-19 LAB — HEPATIC FUNCTION PANEL
ALT: 221 U/L — ABNORMAL HIGH (ref 0–53)
AST: 269 U/L — ABNORMAL HIGH (ref 0–37)
Albumin: 1.8 g/dL — ABNORMAL LOW (ref 3.5–5.2)
Alkaline Phosphatase: 1645 U/L — ABNORMAL HIGH (ref 39–117)
Bilirubin, Direct: 0.2 mg/dL (ref 0.0–0.3)
Indirect Bilirubin: 0.4 mg/dL (ref 0.3–0.9)
Total Bilirubin: 0.6 mg/dL (ref 0.3–1.2)
Total Protein: 7.7 g/dL (ref 6.0–8.3)

## 2013-09-19 MED ORDER — INSULIN DETEMIR 100 UNIT/ML ~~LOC~~ SOLN
22.0000 [IU] | Freq: Every day | SUBCUTANEOUS | Status: DC
  Administered 2013-09-19 – 2013-09-23 (×5): 22 [IU] via SUBCUTANEOUS
  Filled 2013-09-19 (×6): qty 0.22

## 2013-09-19 MED ORDER — ASPIRIN 81 MG PO CHEW
81.0000 mg | CHEWABLE_TABLET | Freq: Every day | ORAL | Status: DC
  Administered 2013-09-19 – 2013-10-10 (×22): 81 mg via ORAL
  Filled 2013-09-19 (×22): qty 1

## 2013-09-19 NOTE — Progress Notes (Addendum)
Physical Therapy Weekly Progress Note  Patient Details  Name: KAYLIB FURNESS MRN: [id number] Date of Birth: February 20, 1934 Today's Date: 09/19/2013 1120-1205, 35 min; 1435-1500, 25 min Individual therapy  Short Term Goals:  Week 1: PT Short Term Goal 1 (Week 1): pt will roll L with min assist --not met PT Short Term Goal 2 (Week 1): pt will transfer bed >< w/c with assist of 1 person - not met PT Short Term Goal 3 (Week 1): pt will tolerate standing x 5 minutes- met  Pt is making slow progress as his lethargy decreases and he clears cognitively.  Initiation, alertness, strength, motor control, functional mobility continue to limit him.   Tx 1:  Pt lethargic, but became more alert during tx as he spend more time in upright sitting and standing. BP before tx started -see DOC flow sheet. Pt participated in game of checkers in standing, appropriately moving his pieces 100% of time, and recognizing strategy 25% of time. neuromuscular re-education via forced use, manual cues for: alternating L and RLE marching on Kinetron at 70 cm/sec, while sitting in w/c, x 20 cycles, x 25 cycles; sit> stand focusing on forward wt shift; standing for LE wt bearing and LE stretching. Pt stood x 4, +2 assist to assume standing, +1 max assist to maintain standing for 15-45 seconds.  Pt returned to nurse's station for observation, quick release belt in place.  Tx 2:  Family present and observed tx.  Pt more alert than in AM. Pt voiced no pain but appeared to have painful buttocks.  Therapeutic activity for wt bearing, stretching bil LEs, wt shifting forward, using static standing frame.  Pt tolerated standing frame x 10 minutes, including cognitive challenge of writing food items on mirror in front of him.  BP after standing x 10 min 109/63; HR 93.   Shavonte Zhao 09/19/2013, 11:43 AM

## 2013-09-19 NOTE — Progress Notes (Signed)
Speech Language Pathology Weekly Progress and Session Note  Patient Details  Name: Elijah [name] MRN: [id number] Date of Birth: 04-19-1933  Beginning of progress report period: September 12, 2013 End of progress report period: September 19, 2013  Today's Date: 09/19/2013 Time: 0830-0929 Time Calculation (min): 59 min  Short Term Goals: Week 1: SLP Short Term Goal 1 (Week 1): Pt will utilize recommended strategies with recommended diet with Mod cues SLP Short Term Goal 1 - Progress (Week 1): Met SLP Short Term Goal 2 (Week 1): Pt will utilize speech intelligibility strategies at the phrase level with Mod cues SLP Short Term Goal 2 - Progress (Week 1): Met SLP Short Term Goal 3 (Week 1): Pt will sustain attention to basic familiar task for 5 minutes with Mod cues SLP Short Term Goal 3 - Progress (Week 1): Met SLP Short Term Goal 4 (Week 1): Pt will verbalize at least 1cognitive and 1 physical impairment with Mod cues SLP Short Term Goal 4 - Progress (Week 1): Progressing toward goal SLP Short Term Goal 5 (Week 1): Pt will demonstrate basic problem solving during basic, familiar task with Mod cues SLP Short Term Goal 5 - Progress (Week 1): Met SLP Short Term Goal 6 (Week 1): Pt will utilize external memory aids to recall new and daily information with Max cues SLP Short Term Goal 6 - Progress (Week 1): Met    New Short Term Goals: Week 2: SLP Short Term Goal 1 (Week 2): Pt will utilize safe swallow strategies while consuming Dys 1 textures and nectar-thick liquids with Min cues SLP Short Term Goal 2 (Week 2): Pt will utilize speech intelligibility strategies at the phrase level with Min cues SLP Short Term Goal 3 (Week 2): Pt will sustain attention to basic familiar task for 10 minutes with Mod cues SLP Short Term Goal 4 (Week 2): Pt will verbalize at least 1cognitive and 1 physical impairment with Mod cues SLP Short Term Goal 5 (Week 2): Pt will demonstrate basic problem solving during basic,  familiar task with Min cues SLP Short Term Goal 6 (Week 2): Pt will utilize external memory aids to recall new and daily information with Mod cues  Weekly Progress Updates: Patient has made functional gains and has met 5 out of 6 short term goals this reporting period due to diet toleration, improved speech intelligibility, sustained attention, recall and basic problem solving.  Patient remains inconsistently oriented with poor intellectual awareness of deficits.  Currently, patient continues to require overall Mod assist for ability to safely complete any basic self-care task.  Patient and family education is ongoing and patient would benefit from family brining in dentures as well as them coming in for therapy.  Patient would benefit from continued skilled SLP intervention to maximize cognition in order to maximize his functional independence prior to discharge with 24/7 assist.    Intensity: Minumum of 1-2 x/day, 30 to 90 minutes Frequency: 5 out of 7 days Duration/Length of Stay: 25-28 days Treatment/Interventions: Cognitive remediation/compensation;Cueing hierarchy;Dysphagia/aspiration precaution training;Environmental controls;Functional tasks;Internal/external aids;Oral motor exercises;Patient/family education;Speech/Language facilitation   Daily Session  Skilled Therapeutic Interventions:  Skilled treatment session focused on addressing dysphagia and cognitive goals. Patient required Max cues for orientation today to place and time throughout session.  Patient repositioned in bed to facilitate safety with PO intake and to maximize self-feeding.  Patient able to attend to self-feeding for ~5 minutes increments with Mod cues for sustained attention to task. Pain was a frequent distractor today and patient  required rest breaks x3 during session. Patient consumed ~40% of breakfast with delayed cough x1 at end of meal.  Patient required Mod verbal cues to problem solve self-feeding and to utilize  safe swallow strategies, which appeared to be effective at reducing aspiration throughout meal.  Continue with current plan of care.       FIM:  Comprehension Comprehension Mode: Auditory Comprehension: 3-Understands basic 50 - 74% of the time/requires cueing 25 - 50%  of the time Expression Expression Mode: Verbal Expression: 3-Expresses basic 50 - 74% of the time/requires cueing 25 - 50% of the time. Needs to repeat parts of sentences. Social Interaction Social Interaction: 3-Interacts appropriately 50 - 74% of the time - May be physically or verbally inappropriate. Problem Solving Problem Solving: 3-Solves basic 50 - 74% of the time/requires cueing 25 - 49% of the time Memory Memory: 2-Recognizes or recalls 25 - 49% of the time/requires cueing 51 - 75% of the time FIM - Eating Eating Activity: 4: Help with managing cup/glass;4: Helper checks for pocketed food;5: Set-up assist for open containers;5: Needs verbal cues/supervision General  Amount of Missed SLP Time (min): 1 Minutes Pain Pain Assessment Pain Assessment: No/denies pain  Therapy/Group: Individual Therapy  Carmelia Roller., CCC-SLP [phone]  [place] 09/19/2013, 11:57 AM

## 2013-09-19 NOTE — Progress Notes (Signed)
Orthopedic Tech Progress Note Patient Details:  Elijah [name] 12/23/1933 [id number]  Advanced contacted for brace  [name], male   DOB: 02/02/1934, 80 y.o.   MRN: [id number]   Early CharsBaker,Kevyn Boquet Anthony 09/19/2013, 1:53 PM

## 2013-09-19 NOTE — Progress Notes (Signed)
Subjective: No acute events.  No complaints.  Objective: Vital signs in last 24 hours: Temp:  [97.6 F (36.4 C)-98.3 F (36.8 C)] 97.6 F (36.4 C) (07/01 0535) Pulse Rate:  [72-88] 76 (07/01 0535) Resp:  [18] 18 (07/01 0535) BP: (97-137)/(54-85) 126/80 mmHg (07/01 0535) SpO2:  [98 %-99 %] 98 % (07/01 0535) Last BM Date: 09/18/13  Intake/Output from previous day: 06/30 0701 - 07/01 0700 In: 780 [P.O.:780] Out: 2000 [Urine:2000] Intake/Output this shift:    General appearance: arousable from sleep GI: soft, non-tender; bowel sounds normal; no masses,  no organomegaly  Lab Results:  Recent Labs  09/19/13 0520  WBC 12.0*  HGB 8.7*  HCT 27.9*  PLT 242   BMET  Recent Labs  09/17/13 0605  NA 154*  K 3.7  CL 119*  CO2 24  GLUCOSE 116*  BUN 21  CREATININE 0.90  CALCIUM 8.5   LFT  Recent Labs  09/19/13 0520  PROT 7.7  ALBUMIN 1.8*  AST 269*  ALT 221*  ALKPHOS 1645*  BILITOT 0.6  BILIDIR 0.2  IBILI 0.4   PT/INR  Recent Labs  09/16/13 1355  LABPROT 17.7*  INR 1.46   Hepatitis Panel No results found for this basename: HEPBSAG, HCVAB, HEPAIGM, HEPBIGM,  in the last 72 hours C-Diff No results found for this basename: CDIFFTOX,  in the last 72 hours Fecal Lactopherrin No results found for this basename: FECLLACTOFRN,  in the last 72 hours  Studies/Results: No results found.  Medications:  Scheduled: . amLODipine  10 mg Oral Daily  . antiseptic oral rinse  15 mL Mouth Rinse q12n4p  .  ceFAZolin (ANCEF) IV  2 g Intravenous 3 times per day  . chlorhexidine  15 mL Mouth Rinse BID  . collagenase   Topical Daily  . enoxaparin (LOVENOX) injection  85 mg Subcutaneous Q12H  . feeding supplement (ENSURE)  1 Container Oral TID BM  . fluconazole  100 mg Oral Daily  . hydrALAZINE  10 mg Oral 4 times per day  . insulin aspart  0-15 Units Subcutaneous TID WC  . insulin aspart  0-5 Units Subcutaneous QHS  . insulin detemir  25 Units Subcutaneous QHS  .  metoprolol tartrate  25 mg Oral 3 times per day  . oxymetazoline  1 spray Each Nare TID   Continuous: . 0.45 % NaCl with KCl 20 mEq / L 75 mL/hr at 09/19/13 0554    Assessment/Plan: 1) Elevated AP - ? Drug reaction. 2) CVA.   His liver enzymes are improving, but slowly.  Clinically he is stable from the hepatic standpoint and he is making progress overall.  There is no pressing need to be aggressive about the work up of the elevated AP, i.e., pursuing a liver biopsy.  I am hoping that his levels will decrease off of Eliquis.  Plan: 1) Continue to follow liver panel.   LOS: 8 days   Eller Sweis D 09/19/2013, 8:12 AM

## 2013-09-19 NOTE — Progress Notes (Signed)
Results for [name], Elijah A (MRN [id number]) as of 09/19/2013 13:56  Ref. Range 09/18/2013 16:26 09/18/2013 21:23 09/19/2013 07:17 09/19/2013 07:44 09/19/2013 12:15  Glucose-Capillary Latest Range: 70-99 mg/dL 409216 (H) 811316 (H) 66 (L) 81 242 (H)   Postprandial CBGs continue to be greater than 180 mg/dl.  Recommend adding Novolog 3 units TID with meals for meal coverage if postprandials continue to be elevated and if eating at least 50% of meals.   Will continue to follow while in hospital.  Smith MinceKendra Corretta Munce RN BSN CDE

## 2013-09-19 NOTE — Patient Care Conference (Signed)
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 CVA (cerebral infarction) 09/11/2013    Expected Discharge Date: Expected Discharge Date: 10/09/13  Team Members Present: Physician leading conference: Dr. Claudette LawsAndrew Kirsteins Social Worker Present: Dossie DerBecky Awesome Jared, LCSW Nurse Present: Darnelle BosNastassia Cave, RN PT Present: Wanda Plumparoline Cook, PT;Blair Hobble, PT OT Present: Bretta BangKris Gellert, OT SLP Present: Other (comment) Joni Reining(Nicole Page-SP) PPS Coordinator present : Edson SnowballBecky Windsor, Chapman FitchPT;Marie Noel, RN, CRRN     Current Status/Progress Goal Weekly Team Focus  Medical   sacral decub, poor cognition, reduced po intake  maintain adequate po intake  appetite stim, dysphagia treatment   Bowel/Bladder   Incont bowel, F/C in place LBM 09/18/13   to be continent of B&B  Timed toileting once F/C removed   Swallow/Nutrition/ Hydration   Dys.1 textures and nectar-thick liquids with full staff supervision, Max assist   least restrictive PO intake with Min assist   trials of Dys 2 textures    ADL's     max-total A   min A overall   ADL training, cognition, attention and functional mobility  Mobility   Mod/Max A bed mobility, max/+2 transfers, pt able to maintain wakefulness and remain alert >5715min at this time, still yet to accomplish [facility] or gait  S bed mobiltiy, min A transfers transfers, gait and WC TBD  sitting balance, bed mobility, transfers,  sustained attention   Communication   Mod assist   Min assist   increase use of speech strategies as well as self-monitoring    Safety/Cognition/ Behavioral Observations  Max assist   Min assist   increase sustained attention; initiation and overall problem solving    Pain   No c/o pain  Pain <3/10  Assess for and treat paim q shift with prn meds   Skin   Miosture related issues on sacrum protective cream applied, rash back to thigh-mircoguard applied, Scrotum edema and reddened-elevated scrotum  No new skin breakdown  Assess skin q shift       *See Care Plan and progress notes for long and short-term goals.  Barriers to Discharge: see above    Possible Resolutions to Barriers:  repeat MBS , upgrade diet when feasible    Discharge Planning/Teaching Needs:  Family plans to take home and provide care-aware he will require 24 hr care.  Appears to be a long ELOS      Team Discussion:  More alert and more awake can participate in therapies.  Need dentures to try advancing diet-ask family for. BP issues yesterday-MD to look into. Skin issues-being treated.  Iniaition issues. Timed toileting once foley removed  Revisions to Treatment Plan:  None   Continued Need for Acute Rehabilitation Level of Care: The patient requires daily medical management by a physician with specialized training in physical medicine and rehabilitation for the following conditions: Daily direction of a multidisciplinary physical rehabilitation program to ensure safe treatment while  eliciting the highest outcome that is of practical value to the patient.: Yes Daily medical management of patient stability for increased activity during participation in an intensive rehabilitation regime.: Yes Daily analysis of laboratory values and/or radiology reports with any subsequent need for medication adjustment of medical intervention for : Neurological problems;Other  Lucy ChrisDupree, Nataley Bahri G 09/21/2013, 8:33 AM

## 2013-09-19 NOTE — Progress Notes (Signed)
Occupational Therapy Weekly Progress Note  Patient Details  Name: Elijah [name] MRN: [id number] Date of Birth: Mar 11, 1934  Beginning of progress report period: 09/13/13 End of progress report period: 09/19/13  Today's Date: 09/19/2013  Patient has met 4 of 5 short term goals.  Patient is making slow yet steady progress toward OT LTGs.  He did not meet the Max A X1 transfer goal secondary to he still requires +2 for safety.  Patient continues to demonstrate the following deficits: somnolent, initiation, muscle weakness, motor control, apraxia, abnormal posture, disturbance of vision, sustained attention, awareness, and problem solving.  Therefore, patient will continue to benefit from skilled OT intervention to enhance overall performance with BADL and Reduce care partner burden.  Patient progressing toward long term goals.  Continue plan of care.  OT Short Term Goals Week 1:   OT Short Term Goal 1 (Week 1): Pt will be able to sit EOB with mod A x 1 for 10 min to engage in UB bathing and dressing. OT Short Term Goal 1 - Progress (Week 1): Met OT Short Term Goal 2 (Week 1): Pt will be able to bathe his UB with min A. OT Short Term Goal 2 - Progress (Week 1): Met OT Short Term Goal 3 (Week 1): Pt will be able to don a tshirt with mod A. OT Short Term Goal 3 - Progress (Week 1): Met OT Short Term Goal 4 (Week 1): Pt will be able to transfer to w/c from bed with max A x1. OT Short Term Goal 4 - Progress (Week 1): Progressing toward goal OT Short Term Goal 5 (Week 1): Pt will demonstrate improved direction following during simple grooming with min cues. OT Short Term Goal 5 - Progress (Week 1): Met Week 2:  OT Short Term Goal 1 (Week 2): Groom:  complete 3 groom tasks while seated in w/c at sink OT Short Term Goal 2 (Week 2): Bath:  8/10 tasks sitting EOB with setup/cues OT Short Term Goal 3 (Week 2): LB Dressing:  Patient will be close supervision to donn feet into pants while sitting EOB. OT  Short Term Goal 4 (Week 2): Bed Mobility:  Min assist for side lying to sit without bed rail and with HOB down to prepare for BADL at EOB.  Therapy Documentation Precautions:  Precautions Precautions: Fall Precaution Comments: pt resists movement (posterior lean) Restrictions Weight Bearing Restrictions: No Pain: Pain Assessment Pain Assessment: No/denies pain ADL: Mod-total +2 See FIM for current functional status  Len Kluver 09/19/2013, 12:18 PM

## 2013-09-19 NOTE — Progress Notes (Signed)
Social Work [name], male   DOB: [birth year], 80 y.o.   MRN: [id number] Met with pt, son and daughter in-law to discuss team conference goals-min level and discharge 7/21.  Encouraged all to attend PT with pt since were here. Son reports they hope he does well and can return home from here.  Discussed with son will need to come up with a plan and encouraged him to talk with Mom  And brother.  Have given him my card and asked to call once discussed the plan.  Son is working on getting dentures they were lost at [facility].

## 2013-09-19 NOTE — Progress Notes (Signed)
Orthopedic Tech Progress Note Patient Details:  Elijah [name] 01/23/1934 [id number] Brace order completed by [employer] vendor.       [name], [name] 09/19/2013, 7:20 PM

## 2013-09-19 NOTE — Progress Notes (Signed)
Occupational Therapy Session Note  Patient Details  Name: Elijah CardJames A Gaubert MRN: [id number] Date of Birth: 04/30/1933  Today's Date: 09/19/2013 Time: [id number] Time Calculation (min): 63 min  Short Term Goals: Week 1:  OT Short Term Goal 1 (Week 1): Pt will be able to sit EOB with mod A x 1 for 10 min to engage in UB bathing and dressing. OT Short Term Goal 2 (Week 1): Pt will be able to bathe his UB with min A. OT Short Term Goal 3 (Week 1): Pt will be able to don a tshirt with mod A. OT Short Term Goal 4 (Week 1): Pt will be able to transfer to w/c from bed with max A x1. OT Short Term Goal 5 (Week 1): Pt will demonstrate improved direction following during simple grooming with min cues.  Skilled Therapeutic Interventions/Progress Updates:  Patient sleeping in bed upon arrival.  Engaged in self care retraining to include sponge bath, dress then transfer into w/c.  Focused session on activity tolerance, alert and engagement, following 1 step commands, problem solving, static and dynamic sitting balance, and dynamic standing balance.  Patient able to follow one step commands without cues during rolling and some simple BADL tasks when he was awake, especially once seated EOB.  Patient able to initiate simple problem solving (with increased time in quiet environment) to position himself most effectively and safely to bend over while seated EOB to doff both socks!  Patient was able to donn left leg into his pants.  Patient required +2 to stand to pull up his pants.  Patient stood 2 times yet with significant posterior bias.  Patient appears fearful to weight shift forward to unweight his bottom to prepare for stand and once on his feet, he resists weight shift forward for COG over BOS.  Patient +2 for safety during squat pivot bed>w/c traveling to his right secondary to w/c moving.  Therapy Documentation Precautions:  Precautions Precautions: Fall Precaution Comments: pt resists movement (posterior  lean) Restrictions Weight Bearing Restrictions: No Pain: Denies pain ADL: See FIM for current functional status  Therapy/Group: Individual Therapy  Rakhi Romagnoli 09/19/2013, 11:11 AM

## 2013-09-19 NOTE — Progress Notes (Signed)
Hypoglycemic Event  CBG: 66  Treatment: orange juice 4oz  Symptoms: No symptoms  Follow-up CBG: Time:0745 CBG Result:81  Possible Reasons for Event: Medication regimen: Insulin  Comments/MD notified:Dr. Trellis MomentKirstens    Meriah Shands M  Remember to initiate Hypoglycemia Order Set & complete

## 2013-09-19 NOTE — Progress Notes (Addendum)
Subjective/Complaints: No abd pain, no nausea or vomiting Eating better overall 50-90% Oriented person and place Appreciate GI notes Reviewed LFTs which are still elevated but slowly coming down Review of Systems - limited secondary to mental status Objective: Vital Signs: Blood pressure 120/72, pulse 88, temperature 97.6 F (36.4 C), temperature source Oral, resp. rate 18, weight 85.594 kg (188 lb 11.2 oz), SpO2 98.00%. No results found. Results for orders placed during the hospital encounter of 09/11/13 (from the past 72 hour(s))  GLUCOSE, CAPILLARY     Status: Abnormal   Collection Time    09/16/13 11:43 AM      Result Value Ref Range   Glucose-Capillary 117 (*) 70 - 99 mg/dL   Comment 1 Notify RN    ANA     Status: None   Collection Time    09/16/13  1:55 PM      Result Value Ref Range   ANA NEGATIVE  NEGATIVE   Comment: Performed at [facility]     Status: None   Collection Time    09/16/13  1:55 PM      Result Value Ref Range   Mitochondrial M2 Ab, IgG 0.39  <0.91   Comment: (NOTE)     Result        Interpretation     ---------     -----------------------------------------------     <0.91         Negative     0.91-1.09     Equivocal     >=1.10        Positive     Performed at [facility] PANEL     Status: Abnormal   Collection Time    09/16/13  1:55 PM      Result Value Ref Range   Total Protein 7.6  6.0 - 8.3 g/dL   Albumin 1.8 (*) 3.5 - 5.2 g/dL   AST 251 (*) 0 - 37 U/L   ALT 198 (*) 0 - 53 U/L   Alkaline Phosphatase 1534 (*) 39 - 117 U/L   Total Bilirubin 0.7  0.3 - 1.2 mg/dL   Bilirubin, Direct 0.3  0.0 - 0.3 mg/dL   Indirect Bilirubin 0.4  0.3 - 0.9 mg/dL  PROTIME-INR     Status: Abnormal   Collection Time    09/16/13  1:55 PM      Result Value Ref Range   Prothrombin Time 17.7 (*) 11.6 - 15.2 seconds   INR 1.46  0.00 - 1.49  GLUCOSE, CAPILLARY     Status: Abnormal   Collection Time     09/16/13  5:22 PM      Result Value Ref Range   Glucose-Capillary 113 (*) 70 - 99 mg/dL   Comment 1 Notify RN    GLUCOSE, CAPILLARY     Status: Abnormal   Collection Time    09/16/13  8:48 PM      Result Value Ref Range   Glucose-Capillary 310 (*) 70 - 99 mg/dL   Comment 1 Notify RN    BASIC METABOLIC PANEL     Status: Abnormal   Collection Time    09/17/13  6:05 AM      Result Value Ref Range   Sodium 154 (*) 137 - 147 mEq/L   Potassium 3.7  3.7 - 5.3 mEq/L   Chloride 119 (*) 96 - 112 mEq/L   CO2 24  19 - 32 mEq/L   Glucose, Bld 116 (*)  70 - 99 mg/dL   BUN 21  6 - 23 mg/dL   Creatinine, Ser 0.90  0.50 - 1.35 mg/dL   Calcium 8.5  8.4 - 10.5 mg/dL   GFR calc non Af Amer 80 (*) >90 mL/min   GFR calc Af Amer >90  >90 mL/min   Comment: (NOTE)     The eGFR has been calculated using the CKD EPI equation.     This calculation has not been validated in all clinical situations.     eGFR's persistently <90 mL/min signify possible Chronic Kidney     Disease.  HEPATIC FUNCTION PANEL     Status: Abnormal   Collection Time    09/17/13  6:05 AM      Result Value Ref Range   Total Protein 7.8  6.0 - 8.3 g/dL   Albumin 1.8 (*) 3.5 - 5.2 g/dL   AST 363 (*) 0 - 37 U/L   ALT 259 (*) 0 - 53 U/L   Alkaline Phosphatase 1720 (*) 39 - 117 U/L   Total Bilirubin 0.6  0.3 - 1.2 mg/dL   Bilirubin, Direct 0.3  0.0 - 0.3 mg/dL   Indirect Bilirubin 0.3  0.3 - 0.9 mg/dL  GLUCOSE, CAPILLARY     Status: Abnormal   Collection Time    09/17/13  7:51 AM      Result Value Ref Range   Glucose-Capillary 108 (*) 70 - 99 mg/dL  GLUCOSE, CAPILLARY     Status: Abnormal   Collection Time    09/17/13 12:03 PM      Result Value Ref Range   Glucose-Capillary 143 (*) 70 - 99 mg/dL  GLUCOSE, CAPILLARY     Status: Abnormal   Collection Time    09/17/13  4:11 PM      Result Value Ref Range   Glucose-Capillary 190 (*) 70 - 99 mg/dL  WOUND CULTURE     Status: None   Collection Time    09/17/13  5:30 PM       Result Value Ref Range   Specimen Description WOUND     Special Requests SACRUM     Gram Stain       Value: NO WBC SEEN     NO SQUAMOUS EPITHELIAL CELLS SEEN     RARE GRAM NEGATIVE RODS     RARE GRAM POSITIVE RODS     Performed at [facility]   Culture       Value: MODERATE GRAM NEGATIVE RODS     Performed at [facility]   Report Status PENDING    GLUCOSE, CAPILLARY     Status: Abnormal   Collection Time    09/17/13  9:14 PM      Result Value Ref Range   Glucose-Capillary 259 (*) 70 - 99 mg/dL  GLUCOSE, CAPILLARY     Status: Abnormal   Collection Time    09/18/13  7:07 AM      Result Value Ref Range   Glucose-Capillary 144 (*) 70 - 99 mg/dL   Comment 1 Notify RN    GLUCOSE, CAPILLARY     Status: Abnormal   Collection Time    09/18/13 12:05 PM      Result Value Ref Range   Glucose-Capillary 227 (*) 70 - 99 mg/dL   Comment 1 Notify RN    GLUCOSE, CAPILLARY     Status: Abnormal   Collection Time    09/18/13  4:26 PM      Result Value  Ref Range   Glucose-Capillary 216 (*) 70 - 99 mg/dL   Comment 1 Notify RN    HEPATIC FUNCTION PANEL     Status: Abnormal   Collection Time    09/18/13  4:31 PM      Result Value Ref Range   Total Protein 7.5  6.0 - 8.3 g/dL   Albumin 1.7 (*) 3.5 - 5.2 g/dL   AST 301 (*) 0 - 37 U/L   ALT 218 (*) 0 - 53 U/L   Alkaline Phosphatase 1688 (*) 39 - 117 U/L   Total Bilirubin 0.6  0.3 - 1.2 mg/dL   Bilirubin, Direct 0.3  0.0 - 0.3 mg/dL   Indirect Bilirubin 0.3  0.3 - 0.9 mg/dL  GLUCOSE, CAPILLARY     Status: Abnormal   Collection Time    09/18/13  9:23 PM      Result Value Ref Range   Glucose-Capillary 316 (*) 70 - 99 mg/dL  CBC     Status: Abnormal   Collection Time    09/19/13  5:20 AM      Result Value Ref Range   WBC 12.0 (*) 4.0 - 10.5 K/uL   RBC 2.87 (*) 4.22 - 5.81 MIL/uL   Hemoglobin 8.7 (*) 13.0 - 17.0 g/dL   HCT 27.9 (*) 39.0 - 52.0 %   MCV 97.2  78.0 - 100.0 fL   MCH 30.3  26.0 - 34.0 pg   MCHC 31.2  30.0 -  36.0 g/dL   RDW 16.2 (*) 11.5 - 15.5 %   Platelets 242  150 - 400 K/uL  HEPATIC FUNCTION PANEL     Status: Abnormal   Collection Time    09/19/13  5:20 AM      Result Value Ref Range   Total Protein 7.7  6.0 - 8.3 g/dL   Albumin 1.8 (*) 3.5 - 5.2 g/dL   AST 269 (*) 0 - 37 U/L   ALT 221 (*) 0 - 53 U/L   Alkaline Phosphatase 1645 (*) 39 - 117 U/L   Total Bilirubin 0.6  0.3 - 1.2 mg/dL   Bilirubin, Direct 0.2  0.0 - 0.3 mg/dL   Indirect Bilirubin 0.4  0.3 - 0.9 mg/dL  GLUCOSE, CAPILLARY     Status: Abnormal   Collection Time    09/19/13  7:17 AM      Result Value Ref Range   Glucose-Capillary 66 (*) 70 - 99 mg/dL   Comment 1 Notify RN    GLUCOSE, CAPILLARY     Status: None   Collection Time    09/19/13  7:44 AM      Result Value Ref Range   Glucose-Capillary 81  70 - 99 mg/dL   Comment 1 Notify RN       HEENT: normal Cardio: irregular and no murmurs Resp: CTA B/L and unlabored GI: BS positive and large bilateral inguinal mass, nontender Extremity:  No Edema  Neuro: Confused, , Abnormal Motor 4/5 in BUE, 3/5 in BLE, Abnormal FMC Ataxic/ dec FMC, Dysarthric and Apraxic Musc/Skel:  Normal Gen NAD Skin:  Heels red  Assessment/Plan: 1. Functional deficits secondary to bilateral cerebral infarct embolic which require 3+ hours per day of interdisciplinary therapy in a comprehensive inpatient rehab setting. Physiatrist is providing close team supervision and 24 hour management of active medical problems listed below. Physiatrist and rehab team continue to assess barriers to discharge/monitor patient progress toward functional and medical goals.  FIM: FIM - Bathing Bathing Steps Patient Completed: Chest;Right  Arm;Left Arm;Abdomen Bathing: 2: Max-Patient completes 3-4 57f10 parts or 25-49% (LB bed level, UB EOB)  FIM - Upper Body Dressing/Undressing Upper body dressing/undressing steps patient completed: Thread/unthread left sleeve of pullover shirt/dress;Put head through  opening of pull over shirt/dress Upper body dressing/undressing: 3: Mod-Patient completed 50-74% of tasks FIM - Lower Body Dressing/Undressing Lower body dressing/undressing steps patient completed: Thread/unthread left pants leg Lower body dressing/undressing: 1: Two helpers (stand to pull up pants)  FIM - Toileting Toileting: 0: Activity did not occur  FIM - TRadio producerDevices: Bedside commode;Grab bars Toilet Transfers: 0-Activity did not occur  FIM - BControl and instrumentation engineerDevices: Bed rails;HOB elevated Bed/Chair Transfer: 1: Two helpers;1: Bed > Chair or W/C: Total A (helper does all/Pt. < 25%)  FIM - Locomotion: Wheelchair Locomotion: Wheelchair: 1: Total Assistance/staff pushes wheelchair (Pt<25%) FIM - Locomotion: Ambulation Locomotion: Ambulation Assistive Devices:  (unable to safely perform) Locomotion: Ambulation: 0: Activity did not occur  Comprehension Comprehension Mode: Auditory Comprehension: 3-Understands basic 50 - 74% of the time/requires cueing 25 - 50%  of the time  Expression Expression Mode: Verbal Expression: 3-Expresses basic 50 - 74% of the time/requires cueing 25 - 50% of the time. Needs to repeat parts of sentences.  Social Interaction Social Interaction: 4-Interacts appropriately 75 - 89% of the time - Needs redirection for appropriate language or to initiate interaction.  Problem Solving Problem Solving: 2-Solves basic 25 - 49% of the time - needs direction more than half the time to initiate, plan or complete simple activities  Memory Memory: 2-Recognizes or recalls 25 - 49% of the time/requires cueing 51 - 75% of the time  Medical Problem List and Plan:  1. Functional deficits secondary to acute cerebral white matter bilateral infarct  2. DVT Prophylaxis/Anticoagulation:off Eliquis per GI due to liver failure, change to ASA until ok to resume Eliquis. Monitor for any bleeding episodes   3. Pain Management: Tylenol as needed  4. Dysphagia with decreased nutritional storage. Dysphagia 1 nectar liquids. Followup speech therapy monitor hydration. Consider appetite stimulant  5. Neuropsych: This patient is not capable of making decisions on his own behalf.  6. Hypertension/atrial fibrillation. Norvasc 10 mg daily, hydralazine 10 mg 4 times a day, metoprolol 25 mg every 8 hours. Monitor with increased mobility  7 ID/methicillin sensitive staph aureus. Continue cefazolin 2 g intravenously every 8 hours x4 weeks total initiated 08/29/2013. Monitor for any fever  8. Diabetes mellitus with peripheral neuropathy. Check CBGs a.c. and at bedtime.reduce Levemir 22 units each bedtime.  9. Integumentary: local skin care, turning, maintain foley for now, mattress overlay, WOC, Diflucan for yeast PRAFOs for heels 10.  Inguinal mass, bilateral hernia, obviously chronic and not incarcerated, given recent CVA, no elective surg recommended at this time 11.  Elevated alk phos, AST,ALT and lipase, stop Lipitor, appreciate GI eval, ? lipitor vs autoimmune with increased IgG and IgA, liver ultrasound OK, appreciate close GI f/u LOS (Days) 8 A FACE TO FACE EVALUATION WAS PERFORMED  Elijah [name] E 09/19/2013, 9:10 AM

## 2013-09-20 ENCOUNTER — Inpatient Hospital Stay (HOSPITAL_COMMUNITY): Payer: Medicare Other | Admitting: *Deleted

## 2013-09-20 ENCOUNTER — Inpatient Hospital Stay (HOSPITAL_COMMUNITY): Payer: Medicare Other | Admitting: Occupational Therapy

## 2013-09-20 ENCOUNTER — Inpatient Hospital Stay (HOSPITAL_COMMUNITY): Payer: Medicare Other | Admitting: Speech Pathology

## 2013-09-20 DIAGNOSIS — I6340 Cerebral infarction due to embolism of unspecified cerebral artery: Secondary | ICD-10-CM

## 2013-09-20 LAB — GLUCOSE, CAPILLARY
Glucose-Capillary: 167 mg/dL — ABNORMAL HIGH (ref 70–99)
Glucose-Capillary: 173 mg/dL — ABNORMAL HIGH (ref 70–99)
Glucose-Capillary: 223 mg/dL — ABNORMAL HIGH (ref 70–99)
Glucose-Capillary: 262 mg/dL — ABNORMAL HIGH (ref 70–99)

## 2013-09-20 LAB — HEPATIC FUNCTION PANEL
ALT: 155 U/L — ABNORMAL HIGH (ref 0–53)
AST: 175 U/L — ABNORMAL HIGH (ref 0–37)
Albumin: 1.8 g/dL — ABNORMAL LOW (ref 3.5–5.2)
Alkaline Phosphatase: 1645 U/L — ABNORMAL HIGH (ref 39–117)
Bilirubin, Direct: 0.2 mg/dL (ref 0.0–0.3)
Indirect Bilirubin: 0.4 mg/dL (ref 0.3–0.9)
Total Bilirubin: 0.6 mg/dL (ref 0.3–1.2)
Total Protein: 7.7 g/dL (ref 6.0–8.3)

## 2013-09-20 LAB — WOUND CULTURE: Gram Stain: NONE SEEN

## 2013-09-20 LAB — CBC
HCT: 27.5 % — ABNORMAL LOW (ref 39.0–52.0)
Hemoglobin: 8.7 g/dL — ABNORMAL LOW (ref 13.0–17.0)
MCH: 30.4 pg (ref 26.0–34.0)
MCHC: 31.6 g/dL (ref 30.0–36.0)
MCV: 96.2 fL (ref 78.0–100.0)
Platelets: 239 10*3/uL (ref 150–400)
RBC: 2.86 MIL/uL — ABNORMAL LOW (ref 4.22–5.81)
RDW: 16.4 % — ABNORMAL HIGH (ref 11.5–15.5)
WBC: 12.5 10*3/uL — ABNORMAL HIGH (ref 4.0–10.5)

## 2013-09-20 NOTE — Progress Notes (Signed)
Subjective/Complaints: Nose Bleed recurrent, 3rd episode but larger volume Reviewed LFTs which are still elevated but slowly coming down Review of Systems - limited secondary to mental status Objective: Vital Signs: Blood pressure 126/72, pulse 93, temperature 97.9 F (36.6 C), temperature source Oral, resp. rate 20, weight 85.594 kg (188 lb 11.2 oz), SpO2 99.00%. No results found. Results for orders placed during the hospital encounter of 09/11/13 (from the past 72 hour(s))  GLUCOSE, CAPILLARY     Status: Abnormal   Collection Time    09/17/13 12:03 PM      Result Value Ref Range   Glucose-Capillary 143 (*) 70 - 99 mg/dL  GLUCOSE, CAPILLARY     Status: Abnormal   Collection Time    09/17/13  4:11 PM      Result Value Ref Range   Glucose-Capillary 190 (*) 70 - 99 mg/dL  WOUND CULTURE     Status: None   Collection Time    09/17/13  5:30 PM      Result Value Ref Range   Specimen Description WOUND     Special Requests SACRUM     Gram Stain       Value: NO WBC SEEN     NO SQUAMOUS EPITHELIAL CELLS SEEN     RARE GRAM NEGATIVE RODS     RARE GRAM POSITIVE RODS     Performed at [facility]   Culture       Value: MODERATE PSEUDOMONAS AERUGINOSA     Performed at [facility]   Report Status 09/20/2013 FINAL     Organism ID, Bacteria PSEUDOMONAS AERUGINOSA    GLUCOSE, CAPILLARY     Status: Abnormal   Collection Time    09/17/13  9:14 PM      Result Value Ref Range   Glucose-Capillary 259 (*) 70 - 99 mg/dL  GLUCOSE, CAPILLARY     Status: Abnormal   Collection Time    09/18/13  7:07 AM      Result Value Ref Range   Glucose-Capillary 144 (*) 70 - 99 mg/dL   Comment 1 Notify RN    GLUCOSE, CAPILLARY     Status: Abnormal   Collection Time    09/18/13 12:05 PM      Result Value Ref Range   Glucose-Capillary 227 (*) 70 - 99 mg/dL   Comment 1 Notify RN    GLUCOSE, CAPILLARY     Status: Abnormal   Collection Time    09/18/13  4:26 PM      Result Value Ref Range    Glucose-Capillary 216 (*) 70 - 99 mg/dL   Comment 1 Notify RN    HEPATIC FUNCTION PANEL     Status: Abnormal   Collection Time    09/18/13  4:31 PM      Result Value Ref Range   Total Protein 7.5  6.0 - 8.3 g/dL   Albumin 1.7 (*) 3.5 - 5.2 g/dL   AST 301 (*) 0 - 37 U/L   ALT 218 (*) 0 - 53 U/L   Alkaline Phosphatase 1688 (*) 39 - 117 U/L   Total Bilirubin 0.6  0.3 - 1.2 mg/dL   Bilirubin, Direct 0.3  0.0 - 0.3 mg/dL   Indirect Bilirubin 0.3  0.3 - 0.9 mg/dL  GLUCOSE, CAPILLARY     Status: Abnormal   Collection Time    09/18/13  9:23 PM      Result Value Ref Range   Glucose-Capillary 316 (*) 70 - 99 mg/dL  CBC     Status: Abnormal   Collection Time    09/19/13  5:20 AM      Result Value Ref Range   WBC 12.0 (*) 4.0 - 10.5 K/uL   RBC 2.87 (*) 4.22 - 5.81 MIL/uL   Hemoglobin 8.7 (*) 13.0 - 17.0 g/dL   HCT 27.9 (*) 39.0 - 52.0 %   MCV 97.2  78.0 - 100.0 fL   MCH 30.3  26.0 - 34.0 pg   MCHC 31.2  30.0 - 36.0 g/dL   RDW 16.2 (*) 11.5 - 15.5 %   Platelets 242  150 - 400 K/uL  HEPATIC FUNCTION PANEL     Status: Abnormal   Collection Time    09/19/13  5:20 AM      Result Value Ref Range   Total Protein 7.7  6.0 - 8.3 g/dL   Albumin 1.8 (*) 3.5 - 5.2 g/dL   AST 269 (*) 0 - 37 U/L   ALT 221 (*) 0 - 53 U/L   Alkaline Phosphatase 1645 (*) 39 - 117 U/L   Total Bilirubin 0.6  0.3 - 1.2 mg/dL   Bilirubin, Direct 0.2  0.0 - 0.3 mg/dL   Indirect Bilirubin 0.4  0.3 - 0.9 mg/dL  GLUCOSE, CAPILLARY     Status: Abnormal   Collection Time    09/19/13  7:17 AM      Result Value Ref Range   Glucose-Capillary 66 (*) 70 - 99 mg/dL   Comment 1 Notify RN    GLUCOSE, CAPILLARY     Status: None   Collection Time    09/19/13  7:44 AM      Result Value Ref Range   Glucose-Capillary 81  70 - 99 mg/dL   Comment 1 Notify RN    GLUCOSE, CAPILLARY     Status: Abnormal   Collection Time    09/19/13 12:15 PM      Result Value Ref Range   Glucose-Capillary 242 (*) 70 - 99 mg/dL   Comment 1  Notify RN    GLUCOSE, CAPILLARY     Status: Abnormal   Collection Time    09/19/13  5:07 PM      Result Value Ref Range   Glucose-Capillary 241 (*) 70 - 99 mg/dL  GLUCOSE, CAPILLARY     Status: Abnormal   Collection Time    09/19/13  9:03 PM      Result Value Ref Range   Glucose-Capillary 211 (*) 70 - 99 mg/dL  GLUCOSE, CAPILLARY     Status: Abnormal   Collection Time    09/20/13  7:34 AM      Result Value Ref Range   Glucose-Capillary 167 (*) 70 - 99 mg/dL   Comment 1 Notify RN    HEPATIC FUNCTION PANEL     Status: Abnormal   Collection Time    09/20/13  8:00 AM      Result Value Ref Range   Total Protein 7.7  6.0 - 8.3 g/dL   Albumin 1.8 (*) 3.5 - 5.2 g/dL   AST 175 (*) 0 - 37 U/L   ALT 155 (*) 0 - 53 U/L   Alkaline Phosphatase 1645 (*) 39 - 117 U/L   Total Bilirubin 0.6  0.3 - 1.2 mg/dL   Bilirubin, Direct 0.2  0.0 - 0.3 mg/dL   Indirect Bilirubin 0.4  0.3 - 0.9 mg/dL     HEENT: normal Cardio: irregular and no murmurs Resp: CTA B/L and unlabored GI: BS  positive and large bilateral inguinal mass, nontender Extremity:  No Edema  Neuro: Confused, , Abnormal Motor 4/5 in BUE, 3/5 in BLE, Abnormal FMC Ataxic/ dec FMC, Dysarthric and Apraxic Musc/Skel:  Normal Gen NAD Skin:  Heels red  Assessment/Plan: 1. Functional deficits secondary to bilateral cerebral infarct embolic which require 3+ hours per day of interdisciplinary therapy in a comprehensive inpatient rehab setting. Physiatrist is providing close team supervision and 24 hour management of active medical problems listed below. Physiatrist and rehab team continue to assess barriers to discharge/monitor patient progress toward functional and medical goals.  FIM: FIM - Bathing Bathing Steps Patient Completed: Chest;Right Arm;Left Arm;Abdomen;Right upper leg;Left upper leg (supine & roll for peri area & buttocks) Bathing: 3: Mod-Patient completes 5-7 [id number] parts or 50-74%  FIM - Upper Body  Dressing/Undressing Upper body dressing/undressing steps patient completed: Thread/unthread left sleeve of pullover shirt/dress;Put head through opening of pull over shirt/dress Upper body dressing/undressing: 3: Mod-Patient completed 50-74% of tasks FIM - Lower Body Dressing/Undressing Lower body dressing/undressing steps patient completed: Thread/unthread left pants leg (able to doff both socks) Lower body dressing/undressing: 1: Two helpers (stand to pull up pants)  FIM - Toileting Toileting: 0: Activity did not occur  FIM - TRadio producerDevices: Bedside commode;Grab bars Toilet Transfers: 0-Activity did not occur  FIM - BControl and instrumentation engineerDevices: Bed rails;HOB elevated Bed/Chair Transfer: 0: Activity did not occur  FIM - Locomotion: Wheelchair Locomotion: Wheelchair: 1: Total Assistance/staff pushes wheelchair (Pt<25%) FIM - Locomotion: Ambulation Locomotion: Ambulation Assistive Devices:  (unable to safely perform) Locomotion: Ambulation: 0: Activity did not occur  Comprehension Comprehension Mode: Auditory Comprehension: 3-Understands basic 50 - 74% of the time/requires cueing 25 - 50%  of the time  Expression Expression Mode: Verbal Expression: 3-Expresses basic 50 - 74% of the time/requires cueing 25 - 50% of the time. Needs to repeat parts of sentences.  Social Interaction Social Interaction: 4-Interacts appropriately 75 - 89% of the time - Needs redirection for appropriate language or to initiate interaction.  Problem Solving Problem Solving: 3-Solves basic 50 - 74% of the time/requires cueing 25 - 49% of the time  Memory Memory: 2-Recognizes or recalls 25 - 49% of the time/requires cueing 51 - 75% of the time  Medical Problem List and Plan:  1. Functional deficits secondary to acute cerebral white matter bilateral infarct  2. DVT Prophylaxis/Anticoagulation:off Eliquis per GI due to liver failure,  change to ASA until ok to resume Eliquis. D/C Lovenox due to bleeding episodes, epistaxis, and small amt bleeding around foley  3. Pain Management: Tylenol as needed  4. Dysphagia with decreased nutritional storage. Dysphagia 1 nectar liquids. Followup speech therapy monitor hydration. Consider appetite stimulant  5. Neuropsych: This patient is not capable of making decisions on his own behalf.  6. Hypertension/atrial fibrillation. Norvasc 10 mg daily, hydralazine 10 mg 4 times a day, metoprolol 25 mg every 8 hours. Monitor with increased mobility  7 ID/methicillin sensitive staph aureus. Continue cefazolin 2 g intravenously every 8 hours x4 weeks total initiated 08/29/2013. Monitor for any fever  8. Diabetes mellitus with peripheral neuropathy. Check CBGs a.c. and at bedtime.reduce Levemir 22 units each bedtime.  9. Integumentary: local skin care, turning, maintain foley for now, mattress overlay, WOC, Diflucan for yeast PRAFOs for heels 10.  Inguinal mass, bilateral hernia, obviously chronic and not incarcerated, given recent CVA, no elective surg recommended at this time 11.  Elevated alk phos, AST,ALT and lipase, stop Lipitor, appreciate  GI eval, ? lipitor vs autoimmune with increased IgG and IgA, liver ultrasound OK, appreciate close GI f/u 12.  Epistaxis d/c lovenox and apply local measures, afrin, may need ENT if not able to control LOS (Days) 9 A FACE TO FACE EVALUATION WAS PERFORMED  Kenyata Guess E 09/20/2013, 10:19 AM

## 2013-09-20 NOTE — H&P (Signed)
09/20/2013 Pt has bloody drainage around insertion site of catheter. Site cleaned no visual blood in urine. Pt does not complain of pain.

## 2013-09-20 NOTE — Progress Notes (Signed)
Subjective: No complaints.  Feeling well.  Objective: Vital signs in last 24 hours: Temp:  [97.6 F (36.4 C)-97.9 F (36.6 C)] 97.6 F (36.4 C) (07/02 1512) Pulse Rate:  [82-96] 82 (07/02 1512) Resp:  [18-20] 18 (07/02 1512) BP: (124-153)/(70-103) 132/77 mmHg (07/02 1512) SpO2:  [97 %-99 %] 97 % (07/02 1512) Last BM Date: 09/18/13  Intake/Output from previous day: 07/01 0701 - 07/02 0700 In: 320 [P.O.:320] Out: 1150 [Urine:1150] Intake/Output this shift: Total I/O In: 240 [P.O.:240] Out: -   General appearance: arousable from sleep GI: soft, non-tender; bowel sounds normal; no masses,  no organomegaly  Lab Results:  Recent Labs  09/19/13 0520 09/20/13 1225  WBC 12.0* 12.5*  HGB 8.7* 8.7*  HCT 27.9* 27.5*  PLT 242 239   BMET No results found for this basename: NA, K, CL, CO2, GLUCOSE, BUN, CREATININE, CALCIUM,  in the last 72 hours LFT  Recent Labs  09/20/13 0800  PROT 7.7  ALBUMIN 1.8*  AST 175*  ALT 155*  ALKPHOS 1645*  BILITOT 0.6  BILIDIR 0.2  IBILI 0.4   PT/INR No results found for this basename: LABPROT, INR,  in the last 72 hours Hepatitis Panel No results found for this basename: HEPBSAG, HCVAB, HEPAIGM, HEPBIGM,  in the last 72 hours C-Diff No results found for this basename: CDIFFTOX,  in the last 72 hours Fecal Lactopherrin No results found for this basename: FECLLACTOFRN,  in the last 72 hours  Studies/Results: No results found.  Medications:  Scheduled: . amLODipine  10 mg Oral Daily  . antiseptic oral rinse  15 mL Mouth Rinse q12n4p  . aspirin  81 mg Oral Daily  .  ceFAZolin (ANCEF) IV  2 g Intravenous 3 times per day  . chlorhexidine  15 mL Mouth Rinse BID  . collagenase   Topical Daily  . feeding supplement (ENSURE)  1 Container Oral TID BM  . fluconazole  100 mg Oral Daily  . hydrALAZINE  10 mg Oral 4 times per day  . insulin aspart  0-15 Units Subcutaneous TID WC  . insulin aspart  0-5 Units Subcutaneous QHS  . insulin  detemir  22 Units Subcutaneous QHS  . metoprolol tartrate  25 mg Oral 3 times per day  . oxymetazoline  1 spray Each Nare TID   Continuous: . 0.45 % NaCl with KCl 20 mEq / L 75 mL/hr at 09/20/13 0908    Assessment/Plan: 1) Elevated AP. 2) CVA.   It appears that his AP has stalled at 1645, but the AST/ALT continue to trend downwards.  Plan: 1) Recheck liver panel on Monday. 2) If the AP has not trended downwards a liver biopsy will be pursued.   LOS: 9 days   Elijah [name] D 09/20/2013, 5:48 PM

## 2013-09-20 NOTE — Progress Notes (Signed)
Physical Therapy Session Note  Patient Details  Name: Elijah [name] MRN: [id number] Date of Birth: 06/01/1933  Today's Date: 09/20/2013 Time: 10:31-11:35 (64min)   Short Term Goals: Week 2:  PT Short Term Goal 1 (Week 2): pt will move supine > sit with min assist PT Short Term Goal 2 (Week 2): pt will transfer with assist of 1 person, > 50% of attempts PT Short Term Goal 3 (Week 2): pt will propel w/c (regular) x 25' with mod assist,  PT Short Term Goal 4 (Week 2): pt will perform gait x 10' with assist of 2 people, 4 out of 5 days  Skilled Therapeutic Interventions/Progress Updates:  Pt just got back in bed following nose bleed, nursing advised to bedside tx with minimal exertion to prevent further bleeding. BP monitored and WNL throughout tx.   Tx focused on sustained attention, extensive LE stretching, manually resisted therex for strengthening, bed mobility, and unsupported sitting balance. Pt needed stimulation throughout to maintain wakefulness, including full S drinking D3 liquids.   Pt engaged in stretching all major  muscle groups, 2x412min each in supine and sidelying for: heel cords, HS, hip flexors and rotators. Hip flexors and HSs very tight. Performed supine and sidelying ex including bridgng, manually resisted mass hip/knee flexion/ext, active ankle, hip, and knee flexion ext x20 with cues for technique. Bridging was very challenging and required stabilizing assist, but pt able to perform with max cues and encouragement.   Performed bed mobility including rolling R/L x4 each and scooting to HOB x3 with Mod A overall and cues for hand placement. Performed supine>sidelying>sit with Mod A for trunk lifting and cues for efficiency. Sit>supine with min A.   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 Pt able to continue discussion up to 5 min with min cues to continue. Pt continues to have posterior pelvic tilt  and declined stand-lateral step or [facility], preferring to lay down and scoot due to fatigue.   Pt left sidelying in bed for pressure relief with all needs in reach, asleep before PT left the room. Nursing updated.         Therapy Documentation Precautions:  Precautions Precautions: Fall Precaution Comments: pt resists movement (posterior lean) Restrictions Weight Bearing Restrictions: No General:   Vital Signs: Therapy Vitals BP: 131/73 mmHg Patient Position (if appropriate): Lying Pain: none  See FIM for current functional status  Therapy/Group: Individual Therapy Clydene Lamingole Jakhia Buxton, PT, DPT  09/20/2013, 11:23 AM

## 2013-09-20 NOTE — Progress Notes (Signed)
Speech Language Pathology Daily Session Note  Patient Details  Name: Elijah [name] MRN: [id number] Date of Birth: 09/22/1933  Today's Date: 09/20/2013 Time: [id number] Time Calculation (min): 56 min  Short Term Goals: Week 2: SLP Short Term Goal 1 (Week 2): Pt will utilize safe swallow strategies while consuming Dys 1 textures and nectar-thick liquids with Min cues SLP Short Term Goal 2 (Week 2): Pt will utilize speech intelligibility strategies at the phrase level with Min cues SLP Short Term Goal 3 (Week 2): Pt will sustain attention to basic familiar task for 10 minutes with Mod cues SLP Short Term Goal 4 (Week 2): Pt will verbalize at least 1cognitive and 1 physical impairment with Mod cues SLP Short Term Goal 5 (Week 2): Pt will demonstrate basic problem solving during basic, familiar task with Min cues SLP Short Term Goal 6 (Week 2): Pt will utilize external memory aids to recall new and daily information with Mod cues  Skilled Therapeutic Interventions: Skilled treatment session focused on addressing cognitive goals. Patient required 2+ assist and Max multimodal cues to follow directions for transfer from bed to chair.  Patient oriented x3 and required Mod cues to utilize calendar for temporal awareness.  Patient awake and participatory for ~1 minute increments with Max cues for sustained attention to basic task.  Nose bleed occurred during session that was stopped after 15 minutes MD, RN and PA made aware and as a result, patient was assisted back to bed 2+ with Max cues.  Patient fatigued as a result breakfast was not consumed during session nurse tech was made aware.   Continue with current plan of care.   FIM:  Comprehension Comprehension Mode: Auditory Comprehension: 2-Understands basic 25 - 49% of the time/requires cueing 51 - 75% of the time Expression Expression Mode: Verbal Expression: 2-Expresses basic 25 - 49% of the time/requires cueing 50 - 75% of the time. Uses single  words/gestures. Social Interaction Social Interaction: 2-Interacts appropriately 25 - 49% of time - Needs frequent redirection. Problem Solving Problem Solving: 2-Solves basic 25 - 49% of the time - needs direction more than half the time to initiate, plan or complete simple activities Memory Memory: 3-Recognizes or recalls 50 - 74% of the time/requires cueing 25 - 49% of the time FIM - Eating Eating Activity: 0: Activity did not occur  Pain Pain Assessment Pain Assessment: No/denies pain Pain Score: Asleep  Therapy/Group: Individual Therapy  Charlane FerrettiMelissa Duston Smolenski, M.A., CCC-SLP [phone]  Elijah [name] 09/20/2013, 1:26 PM

## 2013-09-20 NOTE — Progress Notes (Signed)
Occupational Therapy Session Note  Patient Details  Name: Elijah CardJames A Pawelski MRN: [id number] Date of Birth: 05/17/1933  Today's Date: 09/20/2013 Time: 0822-0859 Time Calculation (min): 37 min (missed 23 min due to nursing care-IV RN and wound care)  Short Term Goals: Week 2:  OT Short Term Goal 1 (Week 2): Groom:  complete 3 groom tasks while seated in w/c at sink OT Short Term Goal 2 (Week 2): Bath:  8/10 tasks sitting EOB with setup/cues OT Short Term Goal 3 (Week 2): LB Dressing:  Patient will be close supervision to donn feet into pants while sitting EOB. OT Short Term Goal 4 (Week 2): Bed Mobility:  Min assist for side lying to sit without bed rail and with HOB down to prepare for BADL at EOB.  Skilled Therapeutic Interventions/Progress Updates:  Patient sleeping in bed upon arrival with IV RN preparing to perform care at PICC.  Engaged in self care retraining to include sponge bath and dressing at bed level due to time limitations.  Focused session on activity tolerance, engagement, alertness, sustained attention, bed mobility, and problem solving.  Patient required assist to donn shirt correctly (LUE in neck of shirt and patient unaware), required assist to problem solve how to pull down shirt in front and back, was able to initiate bridging and rolling to pull up pants.  Patient continues to be very sleepy during sessions.  Therapy Documentation Precautions:  Precautions Precautions: Fall Precaution Comments: pt resists movement (posterior lean) Restrictions Weight Bearing Restrictions: No Pain: Denies pain except occasionally reports LBP (not rated) during hip and back flexion for LB bath and dressing ADL: See FIM for current functional status  Therapy/Group: Individual Therapy  Grisel Blumenstock 09/20/2013, 9:14 AM

## 2013-09-20 NOTE — Progress Notes (Signed)
At approximately 1040 am patient with speech therapist noted to have a nosebleed, and blood also coming from mouth. A small open area noted at top of mouth. Patient vital signs 130/84, 84, 99%. Patient appeared to be in no distress. Patient assisted back to bed vital signs obtained 116/72 88,99%. Notified Malissa Hippoan A., PA, he assessed patient. Will continue to monitor patient.[place]

## 2013-09-21 ENCOUNTER — Inpatient Hospital Stay (HOSPITAL_COMMUNITY): Payer: Medicare Other | Admitting: Speech Pathology

## 2013-09-21 ENCOUNTER — Inpatient Hospital Stay (HOSPITAL_COMMUNITY): Payer: Medicare Other | Admitting: Occupational Therapy

## 2013-09-21 ENCOUNTER — Inpatient Hospital Stay (HOSPITAL_COMMUNITY): Payer: Medicare Other | Admitting: *Deleted

## 2013-09-21 LAB — GLUCOSE, CAPILLARY
Glucose-Capillary: 136 mg/dL — ABNORMAL HIGH (ref 70–99)
Glucose-Capillary: 245 mg/dL — ABNORMAL HIGH (ref 70–99)
Glucose-Capillary: 288 mg/dL — ABNORMAL HIGH (ref 70–99)
Glucose-Capillary: 223 mg/dL — ABNORMAL HIGH (ref 70–99)

## 2013-09-21 MED ORDER — NAPHAZOLINE HCL 0.1 % OP SOLN
1.0000 [drp] | Freq: Four times a day (QID) | OPHTHALMIC | Status: DC | PRN
  Filled 2013-09-21: qty 15

## 2013-09-21 NOTE — Progress Notes (Signed)
Occupational Therapy Session Notes  Patient Details  Name: Elijah [name] MRN: [id number] Date of Birth: 02/20/1934  Today's Date: 09/21/2013 Time: [id number] and 220-250 Time Calculation (min): 53 min and 30 min  Short Term Goals: Week 2:  OT Short Term Goal 1 (Week 2): Groom:  complete 3 groom tasks while seated in w/c at sink OT Short Term Goal 2 (Week 2): Bath:  8/10 tasks sitting EOB with setup/cues OT Short Term Goal 3 (Week 2): LB Dressing:  Patient will be close supervision to donn feet into pants while sitting EOB. OT Short Term Goal 4 (Week 2): Bed Mobility:  Min assist for side lying to sit without bed rail and with HOB down to prepare for BADL at EOB.  Skilled Therapeutic Interventions/Progress Updates:  1)  Patient resting in bed with RN in the room who reports patient has just finished breakfast and wound care is completed on his bottom.  Engaged in self care retraining to include roll to don adult diaper, sit EOB for bath and dressing to include +2 to stand to pull up pants. Transferred with squat pivot bed>w/c with +2 from NT to stabilize w/c.  Focused session on activity tolerance, bed mobility, problem solving, dynamic sitting and standing balance, forward weight shifts while seated to prepare for scoot and for squat pivot transfers.  Once in w/c, patient assisted with shampoo cap while seated at sink and comb hair.  Patient more alert today.  2)  Patient resting in bed upon arrival with RN providing care.  Patient with mild nose bleed and RN assisted to stop it.  Engaged in bed mobility, bed positioning, sitting EOB and standing from EOB.  Patient requires cues for technique for supine>side lying>sit>side lying>supine.  Patient able to scoot self closer to EOB with supervision then this clinician sat in a chair in front of him and he reached forward with his hands on the armrests of chair and lifted his bottom all the way off bed with legs straight for ~20 sec then again after  rest for ~30 sec. Patient requesting to ly back down and was positioned on his left side to provide pressure relief on his bottom  Therapy Documentation Precautions:  Precautions Precautions: Fall Precaution Comments: pt resists movement (posterior lean) Restrictions Weight Bearing Restrictions: No Pain: 1)  Reports low back pain with activity, not rated, RN aware 2)  Reports pain on bottom while seated EOB, changed position and patient reported releif ADL: See FIM for current functional status  Therapy/Group: Individual Therapy both sessions  Jathan Balling 09/21/2013, 11:07 AM

## 2013-09-21 NOTE — Progress Notes (Signed)
Subjective/Complaints: No further nose bleeds Reviewed LFTs which are still elevated but slowly coming down, appreciate GI note Looks more alert today, c/o blurry vision Review of Systems - limited secondary to mental status Objective: Vital Signs: Blood pressure 99/66, pulse 86, temperature 96.7 F (35.9 C), temperature source Oral, resp. rate 18, weight 85.594 kg (188 lb 11.2 oz), SpO2 95.00%. No results found. Results for orders placed during the hospital encounter of 09/11/13 (from the past 72 hour(s))  GLUCOSE, CAPILLARY     Status: Abnormal   Collection Time    09/18/13 12:05 PM      Result Value Ref Range   Glucose-Capillary 227 (*) 70 - 99 mg/dL   Comment 1 Notify RN    GLUCOSE, CAPILLARY     Status: Abnormal   Collection Time    09/18/13  4:26 PM      Result Value Ref Range   Glucose-Capillary 216 (*) 70 - 99 mg/dL   Comment 1 Notify RN    HEPATIC FUNCTION PANEL     Status: Abnormal   Collection Time    09/18/13  4:31 PM      Result Value Ref Range   Total Protein 7.5  6.0 - 8.3 g/dL   Albumin 1.7 (*) 3.5 - 5.2 g/dL   AST 301 (*) 0 - 37 U/L   ALT 218 (*) 0 - 53 U/L   Alkaline Phosphatase 1688 (*) 39 - 117 U/L   Total Bilirubin 0.6  0.3 - 1.2 mg/dL   Bilirubin, Direct 0.3  0.0 - 0.3 mg/dL   Indirect Bilirubin 0.3  0.3 - 0.9 mg/dL  GLUCOSE, CAPILLARY     Status: Abnormal   Collection Time    09/18/13  9:23 PM      Result Value Ref Range   Glucose-Capillary 316 (*) 70 - 99 mg/dL  CBC     Status: Abnormal   Collection Time    09/19/13  5:20 AM      Result Value Ref Range   WBC 12.0 (*) 4.0 - 10.5 K/uL   RBC 2.87 (*) 4.22 - 5.81 MIL/uL   Hemoglobin 8.7 (*) 13.0 - 17.0 g/dL   HCT 27.9 (*) 39.0 - 52.0 %   MCV 97.2  78.0 - 100.0 fL   MCH 30.3  26.0 - 34.0 pg   MCHC 31.2  30.0 - 36.0 g/dL   RDW 16.2 (*) 11.5 - 15.5 %   Platelets 242  150 - 400 K/uL  HEPATIC FUNCTION PANEL     Status: Abnormal   Collection Time    09/19/13  5:20 AM      Result Value Ref Range    Total Protein 7.7  6.0 - 8.3 g/dL   Albumin 1.8 (*) 3.5 - 5.2 g/dL   AST 269 (*) 0 - 37 U/L   ALT 221 (*) 0 - 53 U/L   Alkaline Phosphatase 1645 (*) 39 - 117 U/L   Total Bilirubin 0.6  0.3 - 1.2 mg/dL   Bilirubin, Direct 0.2  0.0 - 0.3 mg/dL   Indirect Bilirubin 0.4  0.3 - 0.9 mg/dL  GLUCOSE, CAPILLARY     Status: Abnormal   Collection Time    09/19/13  7:17 AM      Result Value Ref Range   Glucose-Capillary 66 (*) 70 - 99 mg/dL   Comment 1 Notify RN    GLUCOSE, CAPILLARY     Status: None   Collection Time    09/19/13  7:44 AM  Result Value Ref Range   Glucose-Capillary 81  70 - 99 mg/dL   Comment 1 Notify RN    GLUCOSE, CAPILLARY     Status: Abnormal   Collection Time    09/19/13 12:15 PM      Result Value Ref Range   Glucose-Capillary 242 (*) 70 - 99 mg/dL   Comment 1 Notify RN    GLUCOSE, CAPILLARY     Status: Abnormal   Collection Time    09/19/13  5:07 PM      Result Value Ref Range   Glucose-Capillary 241 (*) 70 - 99 mg/dL  GLUCOSE, CAPILLARY     Status: Abnormal   Collection Time    09/19/13  9:03 PM      Result Value Ref Range   Glucose-Capillary 211 (*) 70 - 99 mg/dL  GLUCOSE, CAPILLARY     Status: Abnormal   Collection Time    09/20/13  7:34 AM      Result Value Ref Range   Glucose-Capillary 167 (*) 70 - 99 mg/dL   Comment 1 Notify RN    HEPATIC FUNCTION PANEL     Status: Abnormal   Collection Time    09/20/13  8:00 AM      Result Value Ref Range   Total Protein 7.7  6.0 - 8.3 g/dL   Albumin 1.8 (*) 3.5 - 5.2 g/dL   AST 175 (*) 0 - 37 U/L   ALT 155 (*) 0 - 53 U/L   Alkaline Phosphatase 1645 (*) 39 - 117 U/L   Total Bilirubin 0.6  0.3 - 1.2 mg/dL   Bilirubin, Direct 0.2  0.0 - 0.3 mg/dL   Indirect Bilirubin 0.4  0.3 - 0.9 mg/dL  GLUCOSE, CAPILLARY     Status: Abnormal   Collection Time    09/20/13 11:43 AM      Result Value Ref Range   Glucose-Capillary 173 (*) 70 - 99 mg/dL   Comment 1 Notify RN    CBC     Status: Abnormal   Collection Time     09/20/13 12:25 PM      Result Value Ref Range   WBC 12.5 (*) 4.0 - 10.5 K/uL   RBC 2.86 (*) 4.22 - 5.81 MIL/uL   Hemoglobin 8.7 (*) 13.0 - 17.0 g/dL   HCT 27.5 (*) 39.0 - 52.0 %   MCV 96.2  78.0 - 100.0 fL   MCH 30.4  26.0 - 34.0 pg   MCHC 31.6  30.0 - 36.0 g/dL   RDW 16.4 (*) 11.5 - 15.5 %   Platelets 239  150 - 400 K/uL  GLUCOSE, CAPILLARY     Status: Abnormal   Collection Time    09/20/13  5:06 PM      Result Value Ref Range   Glucose-Capillary 223 (*) 70 - 99 mg/dL   Comment 1 Notify RN    GLUCOSE, CAPILLARY     Status: Abnormal   Collection Time    09/20/13  9:24 PM      Result Value Ref Range   Glucose-Capillary 262 (*) 70 - 99 mg/dL  GLUCOSE, CAPILLARY     Status: Abnormal   Collection Time    09/21/13  7:43 AM      Result Value Ref Range   Glucose-Capillary 136 (*) 70 - 99 mg/dL   Comment 1 Notify RN       HEENT: normal Cardio: irregular and no murmurs Resp: CTA B/L and unlabored GI: BS positive and large  bilateral inguinal mass, nontender Extremity:  No Edema  Neuro: Confused, , Abnormal Motor 4/5 in BUE, 3/5 in BLE, Abnormal FMC Ataxic/ dec FMC, Dysarthric and Apraxic Musc/Skel:  Normal Gen NAD Skin:  Heels red  Assessment/Plan: 1. Functional deficits secondary to bilateral cerebral infarct embolic which require 3+ hours per day of interdisciplinary therapy in a comprehensive inpatient rehab setting. Physiatrist is providing close team supervision and 24 hour management of active medical problems listed below. Physiatrist and rehab team continue to assess barriers to discharge/monitor patient progress toward functional and medical goals.  FIM: FIM - Bathing Bathing Steps Patient Completed: Chest;Right Arm;Left Arm;Abdomen (supine & roll for peri area & buttocks) Bathing: 2: Max-Patient completes 3-4 [id number] parts or 25-49%  FIM - Upper Body Dressing/Undressing Upper body dressing/undressing steps patient completed: Put head through opening of pull over  shirt/dress;Thread/unthread right sleeve of pullover shirt/dresss Upper body dressing/undressing: 3: Mod-Patient completed 50-74% of tasks FIM - Lower Body Dressing/Undressing Lower body dressing/undressing steps patient completed: Pull pants up/down (bridging in bed to pull up pants) Lower body dressing/undressing: 1: Total-Patient completed less than 25% of tasks  FIM - Toileting Toileting: 0: Activity did not occur  FIM - TRadio producerDevices: Bedside commode;Grab bars Toilet Transfers: 0-Activity did not occur  FIM - BControl and instrumentation engineerDevices: Bed rails Bed/Chair Transfer: 3: Supine > Sit: Mod A (lifting assist/Pt. 50-74%/lift 2 legs;4: Sit > Supine: Min A (steadying pt. > 75%/lift 1 leg)  FIM - Locomotion: Wheelchair Locomotion: Wheelchair: 0: Activity did not occur FIM - Locomotion: Ambulation Locomotion: Ambulation Assistive Devices:  (unable to safely perform) Locomotion: Ambulation: 0: Activity did not occur  Comprehension Comprehension Mode: Auditory Comprehension: 2-Understands basic 25 - 49% of the time/requires cueing 51 - 75% of the time  Expression Expression Mode: Verbal Expression: 2-Expresses basic 25 - 49% of the time/requires cueing 50 - 75% of the time. Uses single words/gestures.  Social Interaction Social Interaction: 2-Interacts appropriately 25 - 49% of time - Needs frequent redirection.  Problem Solving Problem Solving: 2-Solves basic 25 - 49% of the time - needs direction more than half the time to initiate, plan or complete simple activities  Memory Memory: 3-Recognizes or recalls 50 - 74% of the time/requires cueing 25 - 49% of the time  Medical Problem List and Plan:  1. Functional deficits secondary to acute cerebral white matter bilateral infarct  2. DVT Prophylaxis/Anticoagulation:off Eliquis per GI due to liver failure, change to ASA until ok to resume Eliquis. D/C Lovenox due to  bleeding episodes, epistaxis, and small amt bleeding around foley  3. Pain Management: Tylenol as needed  4. Dysphagia with decreased nutritional storage. Dysphagia 1 nectar liquids. Followup speech therapy monitor hydration. Consider appetite stimulant  5. Neuropsych: This patient is not capable of making decisions on his own behalf.  6. Hypertension/atrial fibrillation. Norvasc 10 mg daily, hydralazine 10 mg 4 times a day, metoprolol 25 mg every 8 hours. Monitor with increased mobility  7 ID/methicillin sensitive staph aureus. Continue cefazolin 2 g intravenously every 8 hours x4 weeks total initiated 08/29/2013. Monitor for any fever  8. Diabetes mellitus with peripheral neuropathy. Check CBGs a.c. and at bedtime.reduce Levemir 22 units each bedtime.  9. Integumentary: local skin care, turning, maintain foley for now, mattress overlay, WOC, Diflucan for yeast PRAFOs for heels 10.  Inguinal mass, bilateral hernia, obviously chronic and not incarcerated, given recent CVA, no elective surg recommended at this time 11.  Elevated alk phos,  off Lipitor,  ? lipitor vs autoimmune with increased IgG and IgA, liver ultrasound OK, appreciate close GI f/u 12.  Epistaxis d/c lovenox and apply local measures, afrin,  LOS (Days) 10 A FACE TO FACE EVALUATION WAS PERFORMED  KIRSTEINS,ANDREW E 09/21/2013, 10:33 AM

## 2013-09-21 NOTE — Progress Notes (Signed)
Physical Therapy Session Note  Patient Details  Name: Elijah CardJames A Sopher MRN: [id number] Date of Birth: 08/13/1933  Today's Date: 09/21/2013 Time: [id number] Time Calculation (min): 56 min  Short Term Goals: Week 2:  PT Short Term Goal 1 (Week 2): pt will move supine > sit with min assist PT Short Term Goal 2 (Week 2): pt will transfer with assist of 1 person, > 50% of attempts PT Short Term Goal 3 (Week 2): pt will propel w/c (regular) x 25' with mod assist,  PT Short Term Goal 4 (Week 2): pt will perform gait x 10' with assist of 2 people, 4 out of 5 days  Skilled Therapeutic Interventions/Progress Updates:  Tx focused on functional mobility, standing in // bars, WC propulsion, and NMR via forced use, manual facilitation, and verbal cues. [place]off from OT, pt up in tilt WC, c/o chronic LBP, which limited his participation. Pt needed mod encouragement to participate initially, wanting to rest in bed. Pt educated on importance of mobility on all body systems and DC plans.   Performed sit<>stands in // bars x3 with 2mi nrests between trials: x15 sec, x30sec, x45sec with min A for lifting and steadying. Standing posture marked by forward flexed head and trunk with decreased hip and knee ext.  Pt declining walking attempt today.   Performed passive seated HS stretch x722min bil for functional lengthening. Educated pt on functional importance of stretching.   Instructed pt in [facility] propulsion technique for efficiency and safety. Pt propelled WC 3x75' with S and turning cues. Distance limited by fatigue and pt tending to veere R. Pt will still benefit from daily positioning in tilt WC, but strengthening from propulsion in manual WC with therapy.   Performed stand-pivot transfer WC>bed with max A to complete turn and safety lower. Pt able to transition sit>sidelying with S and increased time and effort. Pt positioning with pillows to remain sidelying. Bed alarm on, all needs in reach, and pt asleep before  therapist exited room.       Therapy Documentation Precautions:  Precautions Precautions: Fall Precaution Comments: pt resists movement (posterior lean) Restrictions Weight Bearing Restrictions: No    Vital Signs: Therapy Vitals BP: 99/66 mmHg Patient Position (if appropriate): Sitting  Pain: 9/10 back pain, asked PA for K-pad order, modified tx   Locomotion : Wheelchair Mobility Distance: 75   See FIM for current functional status  Therapy/Group: Individual Therapy Clydene Lamingole Fleetwood Pierron, PT, DPT 09/21/2013, 10:57 AM

## 2013-09-21 NOTE — Progress Notes (Signed)
Late entry: nursing 09/21/13  Patient had 1 episode of nosebleed this am ; controlled and no further episodes.

## 2013-09-21 NOTE — Progress Notes (Signed)
Speech Language Pathology Daily Session Note  Patient Details  Name: Elijah [name] MRN: [id number] Date of Birth: 09/08/1933  Today's Date: 09/21/2013 Time: 1316-1401 Time Calculation (min): 45 min  Short Term Goals: Week 2: SLP Short Term Goal 1 (Week 2): Pt will utilize safe swallow strategies while consuming Dys 1 textures and nectar-thick liquids with Min cues SLP Short Term Goal 2 (Week 2): Pt will utilize speech intelligibility strategies at the phrase level with Min cues SLP Short Term Goal 3 (Week 2): Pt will sustain attention to basic familiar task for 10 minutes with Mod cues SLP Short Term Goal 4 (Week 2): Pt will verbalize at least 1cognitive and 1 physical impairment with Mod cues SLP Short Term Goal 5 (Week 2): Pt will demonstrate basic problem solving during basic, familiar task with Min cues SLP Short Term Goal 6 (Week 2): Pt will utilize external memory aids to recall new and daily information with Mod cues  Skilled Therapeutic Interventions:  Pt was seen for skilled speech therapy targeting sustained attention and basic problem solving.  Pt was approximately 75% accurate for counting and sorting money with mod-max assist verbal cuing.  Additionally, pt generated the appropriate amount of change with coins when named for >75% accuracy with min-mod cuing for sustained attention and thought organization.  Pt required max assist for recall of daily events and information related to his therapy schedule; however, he was oriented to place, date, and situation with modified independence. Continue per current plan of care.    FIM:  Comprehension Comprehension Mode: Auditory Comprehension: 4-Understands basic 75 - 89% of the time/requires cueing 10 - 24% of the time Expression Expression Mode: Verbal Expression: 3-Expresses basic 50 - 74% of the time/requires cueing 25 - 50% of the time. Needs to repeat parts of sentences. Social Interaction Social Interaction: 4-Interacts  appropriately 75 - 89% of the time - Needs redirection for appropriate language or to initiate interaction. Problem Solving Problem Solving: 3-Solves basic 50 - 74% of the time/requires cueing 25 - 49% of the time Memory Memory: 2-Recognizes or recalls 25 - 49% of the time/requires cueing 51 - 75% of the time FIM - Eating Eating Activity: 1: Helper feeds patient  Pain Pain Assessment Pain Assessment: No/denies pain  Therapy/Group: Individual Therapy  Isaiha Asare, Melanee SpryNicole L 09/21/2013, 2:57 PM

## 2013-09-21 NOTE — Progress Notes (Signed)
09/20/13 1426 nursing Pt had another nosebleed episode minimal amount; controlled; will continue to monitor.

## 2013-09-22 ENCOUNTER — Inpatient Hospital Stay (HOSPITAL_COMMUNITY): Payer: Medicare Other | Admitting: *Deleted

## 2013-09-22 ENCOUNTER — Inpatient Hospital Stay (HOSPITAL_COMMUNITY): Payer: Medicare Other

## 2013-09-22 ENCOUNTER — Inpatient Hospital Stay (HOSPITAL_COMMUNITY): Payer: Medicare Other | Admitting: Physical Therapy

## 2013-09-22 DIAGNOSIS — E1149 Type 2 diabetes mellitus with other diabetic neurological complication: Secondary | ICD-10-CM

## 2013-09-22 LAB — CBC
HCT: 24.9 % — ABNORMAL LOW (ref 39.0–52.0)
Hemoglobin: 8 g/dL — ABNORMAL LOW (ref 13.0–17.0)
MCH: 30.5 pg (ref 26.0–34.0)
MCHC: 32.1 g/dL (ref 30.0–36.0)
MCV: 95 fL (ref 78.0–100.0)
Platelets: 209 10*3/uL (ref 150–400)
RBC: 2.62 MIL/uL — ABNORMAL LOW (ref 4.22–5.81)
RDW: 16.6 % — ABNORMAL HIGH (ref 11.5–15.5)
WBC: 12.8 10*3/uL — ABNORMAL HIGH (ref 4.0–10.5)

## 2013-09-22 LAB — GLUCOSE, CAPILLARY
Glucose-Capillary: 187 mg/dL — ABNORMAL HIGH (ref 70–99)
Glucose-Capillary: 236 mg/dL — ABNORMAL HIGH (ref 70–99)
Glucose-Capillary: 293 mg/dL — ABNORMAL HIGH (ref 70–99)
Glucose-Capillary: 164 mg/dL — ABNORMAL HIGH (ref 70–99)

## 2013-09-22 NOTE — Progress Notes (Signed)
Nursing Note: Pt given a supp. earlier after he kept having smears and pt had small loose stool .Went in to re-apply dressing to bottom and noted that rectum was stretched w/ stool visible.A: Pt was gently dis-impacted for a moderate amount of soft stool.Pt tolerated fair.wbb

## 2013-09-22 NOTE — Progress Notes (Signed)
Unable to keep turned, patient continues to reposition self onto back. Bilateral PRAFO's in place. A & O to self only. Dressing to sacrum. No further stools. No bleeding noted.  Foley patent and draining clear yellow urine. Edema (not new) to scrotal/pubic area. Poor initiation. Alfredo MartinezMurray, Sabrena Gavitt A

## 2013-09-22 NOTE — Progress Notes (Signed)
[name], male   DOB: 09-16-1933, 80 y.o.   MRN: [id number]   [name], male   DOB: 1934/01/29, 80 y.o.   MRN: [id number]   [name], male   DOB: 18-Dec-1933, 80 y.o.   MRN: [id number]  09/22/13  Subjective/Complaints:  80 y/o admit for CIR with functional deficits secondary to bilateral cerebral infarct embolic  Mental status continues to improve; much more alert this weekend.  Blood sugars remained quite labile.  Foley catheter remains in place;  remains afebrile.  Hemoglobin down to 8.0  grams percent today.  Remains on Ancef  Review of Systems - limited secondary to mental status    Objective: Vital Signs: Blood pressure 136/84, pulse 85, temperature 98.5 F (36.9 C), temperature source Oral, resp. rate 18, weight 188 lb 11.2 oz (85.594 kg), SpO2 100.00%. No results found. Results for orders placed during the hospital encounter of 09/11/13 (from the past 72 hour(s))  GLUCOSE, CAPILLARY     Status: Abnormal   Collection Time    09/19/13 12:15 PM      Result Value Ref Range   Glucose-Capillary 242 (*) 70 - 99 mg/dL   Comment 1 Notify RN    GLUCOSE, CAPILLARY     Status: Abnormal   Collection Time    09/19/13  5:07 PM      Result Value Ref Range   Glucose-Capillary 241 (*) 70 - 99 mg/dL  GLUCOSE, CAPILLARY     Status: Abnormal   Collection Time    09/19/13  9:03 PM      Result Value Ref Range   Glucose-Capillary 211 (*) 70 - 99 mg/dL  GLUCOSE, CAPILLARY     Status: Abnormal   Collection Time    09/20/13  7:34 AM      Result Value Ref Range   Glucose-Capillary 167 (*) 70 - 99 mg/dL   Comment 1 Notify RN    HEPATIC FUNCTION PANEL     Status: Abnormal   Collection Time    09/20/13  8:00 AM      Result Value Ref Range   Total Protein 7.7  6.0 - 8.3 g/dL   Albumin 1.8 (*) 3.5 - 5.2 g/dL   AST 175 (*) 0 - 37 U/L   ALT 155 (*) 0 - 53 U/L   Alkaline Phosphatase 1645 (*) 39 - 117 U/L   Total Bilirubin 0.6  0.3 - 1.2  mg/dL   Bilirubin, Direct 0.2  0.0 - 0.3 mg/dL   Indirect Bilirubin 0.4  0.3 - 0.9 mg/dL  GLUCOSE, CAPILLARY     Status: Abnormal   Collection Time    09/20/13 11:43 AM      Result Value Ref Range   Glucose-Capillary 173 (*) 70 - 99 mg/dL   Comment 1 Notify RN    CBC     Status: Abnormal   Collection Time    09/20/13 12:25 PM      Result Value Ref Range   WBC 12.5 (*) 4.0 - 10.5 K/uL   RBC 2.86 (*) 4.22 - 5.81 MIL/uL   Hemoglobin 8.7 (*) 13.0 - 17.0 g/dL   HCT 27.5 (*) 39.0 - 52.0 %   MCV 96.2  78.0 - 100.0 fL   MCH 30.4  26.0 - 34.0 pg   MCHC 31.6  30.0 - 36.0 g/dL   RDW 16.4 (*) 11.5 - 15.5 %   Platelets 239  150 - 400 K/uL  GLUCOSE, CAPILLARY  Status: Abnormal   Collection Time    09/20/13  5:06 PM      Result Value Ref Range   Glucose-Capillary 223 (*) 70 - 99 mg/dL   Comment 1 Notify RN    GLUCOSE, CAPILLARY     Status: Abnormal   Collection Time    09/20/13  9:24 PM      Result Value Ref Range   Glucose-Capillary 262 (*) 70 - 99 mg/dL  GLUCOSE, CAPILLARY     Status: Abnormal   Collection Time    09/21/13  7:43 AM      Result Value Ref Range   Glucose-Capillary 136 (*) 70 - 99 mg/dL   Comment 1 Notify RN    GLUCOSE, CAPILLARY     Status: Abnormal   Collection Time    09/21/13 11:14 AM      Result Value Ref Range   Glucose-Capillary 245 (*) 70 - 99 mg/dL   Comment 1 Notify RN    GLUCOSE, CAPILLARY     Status: Abnormal   Collection Time    09/21/13  4:40 PM      Result Value Ref Range   Glucose-Capillary 288 (*) 70 - 99 mg/dL   Comment 1 Notify RN    GLUCOSE, CAPILLARY     Status: Abnormal   Collection Time    09/21/13  8:43 PM      Result Value Ref Range   Glucose-Capillary 223 (*) 70 - 99 mg/dL   Comment 1 Notify RN    CBC     Status: Abnormal   Collection Time    09/22/13  4:30 AM      Result Value Ref Range   WBC 12.8 (*) 4.0 - 10.5 K/uL   RBC 2.62 (*) 4.22 - 5.81 MIL/uL   Hemoglobin 8.0 (*) 13.0 - 17.0 g/dL   HCT 24.9 (*) 39.0 - 52.0 %   MCV  95.0  78.0 - 100.0 fL   MCH 30.5  26.0 - 34.0 pg   MCHC 32.1  30.0 - 36.0 g/dL   RDW 16.6 (*) 11.5 - 15.5 %   Platelets 209  150 - 400 K/uL  GLUCOSE, CAPILLARY     Status: Abnormal   Collection Time    09/22/13  7:13 AM      Result Value Ref Range   Glucose-Capillary 187 (*) 70 - 99 mg/dL   Comment 1 Notify RN       Intake/Output Summary (Last 24 hours) at 09/22/13 0808 Last data filed at 09/22/13 0528  Gross per 24 hour  Intake   1380 ml  Output   1926 ml  Net   -546 ml    Patient Vitals for the past 24 hrs:  BP Temp Temp src Pulse Resp SpO2  09/22/13 0519 136/84 mmHg - - 85 - -  09/22/13 0514 140/80 mmHg 98.5 F (36.9 C) Oral 84 18 100 %  09/22/13 0027 138/78 mmHg 97.3 F (36.3 C) Oral 80 - -  09/21/13 2136 130/73 mmHg 100.2 F (37.9 C) Oral 88 17 96 %  09/21/13 2131 122/60 mmHg - - 80 - -  09/21/13 1500 107/71 mmHg 99 F (37.2 C) Oral 80 18 97 %  09/21/13 0955 99/66 mmHg - - - - -   CBG (last 3)   Recent Labs  09/21/13 1640 09/21/13 2043 09/22/13 0713  GLUCAP 288* 223* 187*    HEENT: normal Cardio: irregular and no murmurs Resp: CTA B/L and unlabored  Cheyne stokes respirations  GI: BS positive and large bilateral inguinal mass, non reducable mildly tender Extremity:  No Edema Skin:    PICC line R armNeuro: more alert , Abnormal Motor 4/5 in BUE, 3/5 in BLE, Abnormal FMC  Musc/Skel:  Normal Gen NAD GU- foley cath   Assessment/Plan:  Medical Problem List and Plan:  1. Functional deficits secondary to acute cerebral white matter bilateral infarct  2. DVT Prophylaxis/Anticoagulation: Eliquis. Monitor for any bleeding episodes  3. Pain Management: Tylenol as needed  4. Dysphagia with decreased nutritional storage. Dysphagia 1 nectar liquids. Followup speech therapy monitor hydration. Consider appetite stimulant  5. Neuropsych: This patient is not capable of making decisions on his own behalf.  6. Hypertension/atrial fibrillation. Norvasc 10 mg daily,  hydralazine 10 mg 4 times a day, metoprolol 25 mg every 8 hours. Monitor with increased mobility  7 ID/methicillin sensitive staph aureus. Continue cefazolin 2 g intravenously every 8 hours x4 weeks total initiated 08/29/2013. Monitor for any fever  8. Diabetes mellitus with peripheral neuropathy. Check CBGs a.c. and at bedtime. Levemir 20 units each bedtime.  Continue SSI coverage 9. Integumentary: local skin care, turning, maintain foley for now, mattress overlay, WOC, Diflucan for yeast 10.  Inguinal mass, bilateral hernia, obviously chronic and not incarcerated, given recent CVA, no elective surg recommended at this time 11.  Elevated alk phos, AST,ALT and lipase, stop Lipitor, ? Etiol, CT abd with small pancreatic cyst at tail ,  abd exam normal except hernia, GI eval 12. Worsening hypernatremia with poor po intake;  Will  Continue supplement with IVF today and add potassium supplement.  Check Bmet in am.  LOS (Days) Ida EVALUATION WAS PERFORMED  Nyoka Cowden 09/22/2013, 8:08 AM

## 2013-09-22 NOTE — Progress Notes (Signed)
Physical Therapy Session Note  Patient Details  Name: Elijah CardJames A Urda MRN: [id number] Date of Birth: 05/09/1933  Today's Date: 09/22/2013 Time: [id number] Time Calculation (min): 32 min  Short Term Goals: Week 2:  PT Short Term Goal 1 (Week 2): pt will move supine > sit with min assist PT Short Term Goal 2 (Week 2): pt will transfer with assist of 1 person, > 50% of attempts PT Short Term Goal 3 (Week 2): pt will propel w/c (regular) x 25' with mod assist,  PT Short Term Goal 4 (Week 2): pt will perform gait x 10' with assist of 2 people, 4 out of 5 days  Skilled Therapeutic Interventions/Progress Updates:    Pt with difficulty sequencing multi-step commands in session and requires frequent cueing for safety and impulsivity throughout. This difficulties limited mobility training in session. Pt does best with simple single cues to best sequence an activity. Pt would continue to benefit from skilled PT services to increase functional mobility.  Therapy Documentation Precautions:  Precautions Precautions: Fall Precaution Comments: pt resists movement (posterior lean) Restrictions Weight Bearing Restrictions: No Pain:  Pt reports no pain Mobility:  Mod A with cues for weight shift and safety in transfers and bed mobility Locomotion :   Not performed Other Treatments:  Pt performs bridging 2x10 and rolling x5 B/L in bed performing activities with functional context. Pt performs seated dynamic balance activities with MinGuard with dual motor task including reaching, grasping, weight shifting, and LE manipulation in functional contexts.  See FIM for current functional status  Therapy/Group: Individual Therapy  Christia ReadingKinney, Pieter Fooks G 09/22/2013, 12:01 PM

## 2013-09-23 ENCOUNTER — Inpatient Hospital Stay (HOSPITAL_COMMUNITY): Payer: Medicare Other

## 2013-09-23 LAB — GLUCOSE, CAPILLARY
Glucose-Capillary: 163 mg/dL — ABNORMAL HIGH (ref 70–99)
Glucose-Capillary: 158 mg/dL — ABNORMAL HIGH (ref 70–99)
Glucose-Capillary: 270 mg/dL — ABNORMAL HIGH (ref 70–99)
Glucose-Capillary: 215 mg/dL — ABNORMAL HIGH (ref 70–99)

## 2013-09-23 LAB — CBC WITH DIFFERENTIAL/PLATELET
Basophils Absolute: 0 10*3/uL (ref 0.0–0.1)
Basophils Relative: 0 % (ref 0–1)
Eosinophils Absolute: 0.4 10*3/uL (ref 0.0–0.7)
Eosinophils Relative: 3 % (ref 0–5)
HCT: 24.4 % — ABNORMAL LOW (ref 39.0–52.0)
Hemoglobin: 7.8 g/dL — ABNORMAL LOW (ref 13.0–17.0)
Lymphocytes Relative: 18 % (ref 12–46)
Lymphs Abs: 2.7 10*3/uL (ref 0.7–4.0)
MCH: 30.5 pg (ref 26.0–34.0)
MCHC: 32 g/dL (ref 30.0–36.0)
MCV: 95.3 fL (ref 78.0–100.0)
Monocytes Absolute: 0.7 10*3/uL (ref 0.1–1.0)
Monocytes Relative: 5 % (ref 3–12)
Neutro Abs: 10.8 10*3/uL — ABNORMAL HIGH (ref 1.7–7.7)
Neutrophils Relative %: 74 % (ref 43–77)
Platelets: 220 10*3/uL (ref 150–400)
RBC: 2.56 MIL/uL — ABNORMAL LOW (ref 4.22–5.81)
RDW: 16.7 % — ABNORMAL HIGH (ref 11.5–15.5)
WBC: 14.6 10*3/uL — ABNORMAL HIGH (ref 4.0–10.5)

## 2013-09-23 NOTE — Progress Notes (Signed)
[name], male   DOB: 1933-08-27, 80 y.o.   MRN: [id number]  [name], male   DOB: Jun 16, 1933, 80 y.o.   MRN: [id number]   [name], male   DOB: [birth year], 80 y.o.   MRN: [id number]   [name], male   DOB: 12-19-1933, 80 y.o.   MRN: [id number]  09/23/13  Subjective/Complaints:  80 y/o admit for CIR with functional deficits secondary to bilateral cerebral infarct embolic  Mental status continues to improve; much more alert this weekend.  Blood sugars remained quite labile.  Foley catheter remains in place;  remains afebrile.  Hemoglobin down to 8.0  grams percent yesterday.  Remains on Ancef  Review of Systems - limited secondary to mental status  No past medical history on file.   Objective: Vital Signs: Blood pressure 141/77, pulse 86, temperature 97.5 F (36.4 C), temperature source Oral, resp. rate 20, weight 188 lb 11.2 oz (85.594 kg), SpO2 99.00%. No results found. Results for orders placed during the hospital encounter of 09/11/13 (from the past 72 hour(s))  HEPATIC FUNCTION PANEL     Status: Abnormal   Collection Time    09/20/13  8:00 AM      Result Value Ref Range   Total Protein 7.7  6.0 - 8.3 g/dL   Albumin 1.8 (*) 3.5 - 5.2 g/dL   AST 175 (*) 0 - 37 U/L   ALT 155 (*) 0 - 53 U/L   Alkaline Phosphatase 1645 (*) 39 - 117 U/L   Total Bilirubin 0.6  0.3 - 1.2 mg/dL   Bilirubin, Direct 0.2  0.0 - 0.3 mg/dL   Indirect Bilirubin 0.4  0.3 - 0.9 mg/dL  GLUCOSE, CAPILLARY     Status: Abnormal   Collection Time    09/20/13 11:43 AM      Result Value Ref Range   Glucose-Capillary 173 (*) 70 - 99 mg/dL   Comment 1 Notify RN    CBC     Status: Abnormal   Collection Time    09/20/13 12:25 PM      Result Value Ref Range   WBC 12.5 (*) 4.0 - 10.5 K/uL   RBC 2.86 (*) 4.22 - 5.81 MIL/uL   Hemoglobin 8.7 (*) 13.0 - 17.0 g/dL   HCT 27.5 (*) 39.0 - 52.0 %   MCV 96.2  78.0 - 100.0 fL   MCH 30.4  26.0 -  34.0 pg   MCHC 31.6  30.0 - 36.0 g/dL   RDW 16.4 (*) 11.5 - 15.5 %   Platelets 239  150 - 400 K/uL  GLUCOSE, CAPILLARY     Status: Abnormal   Collection Time    09/20/13  5:06 PM      Result Value Ref Range   Glucose-Capillary 223 (*) 70 - 99 mg/dL   Comment 1 Notify RN    GLUCOSE, CAPILLARY     Status: Abnormal   Collection Time    09/20/13  9:24 PM      Result Value Ref Range   Glucose-Capillary 262 (*) 70 - 99 mg/dL  GLUCOSE, CAPILLARY     Status: Abnormal   Collection Time    09/21/13  7:43 AM      Result Value Ref Range   Glucose-Capillary 136 (*) 70 - 99 mg/dL   Comment 1 Notify RN    GLUCOSE, CAPILLARY     Status: Abnormal   Collection Time    09/21/13  11:14 AM      Result Value Ref Range   Glucose-Capillary 245 (*) 70 - 99 mg/dL   Comment 1 Notify RN    GLUCOSE, CAPILLARY     Status: Abnormal   Collection Time    09/21/13  4:40 PM      Result Value Ref Range   Glucose-Capillary 288 (*) 70 - 99 mg/dL   Comment 1 Notify RN    GLUCOSE, CAPILLARY     Status: Abnormal   Collection Time    09/21/13  8:43 PM      Result Value Ref Range   Glucose-Capillary 223 (*) 70 - 99 mg/dL   Comment 1 Notify RN    CBC     Status: Abnormal   Collection Time    09/22/13  4:30 AM      Result Value Ref Range   WBC 12.8 (*) 4.0 - 10.5 K/uL   RBC 2.62 (*) 4.22 - 5.81 MIL/uL   Hemoglobin 8.0 (*) 13.0 - 17.0 g/dL   HCT 24.9 (*) 39.0 - 52.0 %   MCV 95.0  78.0 - 100.0 fL   MCH 30.5  26.0 - 34.0 pg   MCHC 32.1  30.0 - 36.0 g/dL   RDW 16.6 (*) 11.5 - 15.5 %   Platelets 209  150 - 400 K/uL  GLUCOSE, CAPILLARY     Status: Abnormal   Collection Time    09/22/13  7:13 AM      Result Value Ref Range   Glucose-Capillary 187 (*) 70 - 99 mg/dL   Comment 1 Notify RN    GLUCOSE, CAPILLARY     Status: Abnormal   Collection Time    09/22/13 11:31 AM      Result Value Ref Range   Glucose-Capillary 236 (*) 70 - 99 mg/dL   Comment 1 Notify RN    GLUCOSE, CAPILLARY     Status: Abnormal    Collection Time    09/22/13  4:31 PM      Result Value Ref Range   Glucose-Capillary 293 (*) 70 - 99 mg/dL   Comment 1 Notify RN    GLUCOSE, CAPILLARY     Status: Abnormal   Collection Time    09/22/13  8:57 PM      Result Value Ref Range   Glucose-Capillary 164 (*) 70 - 99 mg/dL   Comment 1 Notify RN    GLUCOSE, CAPILLARY     Status: Abnormal   Collection Time    09/23/13  7:02 AM      Result Value Ref Range   Glucose-Capillary 163 (*) 70 - 99 mg/dL   Comment 1 Notify RN       Intake/Output Summary (Last 24 hours) at 09/23/13 0745 Last data filed at 09/22/13 2300  Gross per 24 hour  Intake   2030 ml  Output    900 ml  Net   1130 ml    Patient Vitals for the past 24 hrs:  BP Temp Temp src Pulse Resp SpO2  09/23/13 0521 141/77 mmHg 97.5 F (36.4 C) Oral 86 20 99 %  09/22/13 2053 131/80 mmHg 98.1 F (36.7 C) Oral 85 18 97 %  09/22/13 1500 136/73 mmHg 98 F (36.7 C) Oral 78 16 97 %   CBG (last 3)   Recent Labs  09/22/13 1631 09/22/13 2057 09/23/13 0702  GLUCAP 293* 164* 163*    HEENT: normal Cardio: irregular and no murmurs Resp: CTA B/L and unlabored  Cheyne stokes  respirations GI: BS positive and large bilateral inguinal mass, non reducable mildly tender Extremity:  No Edema  Ankle braces in place Skin:    PICC line R armNeuro: more alert , Abnormal Motor 4/5 in BUE, 3/5 in BLE, Abnormal FMC  Musc/Skel:  Normal Gen NAD GU- foley cath   Assessment/Plan:  Medical Problem List and Plan:  1. Functional deficits secondary to acute cerebral white matter bilateral infarct  2. DVT Prophylaxis/Anticoagulation:  On hold 3. Pain Management: Tylenol as needed  4. Dysphagia with decreased nutritional storage. Dysphagia 1 nectar liquids. Followup speech therapy monitor hydration. Consider appetite stimulant  5. Neuropsych: This patient is not capable of making decisions on his own behalf.  6. Hypertension/atrial fibrillation. Norvasc 10 mg daily, hydralazine 10 mg 4  times a day, metoprolol 25 mg every 8 hours. Monitor with increased mobility  7 ID/methicillin sensitive staph aureus. Continue cefazolin 2 g intravenously every 8 hours x4 weeks total initiated 08/29/2013. Monitor for any fever  8. Diabetes mellitus with peripheral neuropathy. Check CBGs a.c. and at bedtime. Levemir 20 units each bedtime.  Continue SSI coverage 9. Integumentary: local skin care, turning, maintain foley for now, mattress overlay, WOC, Diflucan for yeast 10.  Inguinal mass, bilateral hernia, obviously chronic and not incarcerated, given recent CVA, no elective surg recommended at this time 11.  Elevated alk phos, AST,ALT and lipase, stop Lipitor, ? Etiol, CT abd with small pancreatic cyst at tail ,  abd exam normal except hernia, GI eval 12. Worsening Anemia- will check CBC today;  If stable, consider resuming Lovenox LOS (Days) 12 A FACE TO FACE EVALUATION WAS PERFORMED  Nyoka Cowden 09/23/2013, 7:45 AM

## 2013-09-23 NOTE — Progress Notes (Signed)
Occupational Therapy Note  Patient Details  Name: Elijah [name] MRN: [id number] Date of Birth: 11/07/1933 Today's Date: 09/23/2013  Time: 1430-1500 Pt c/o pain in mid back with activity; RN aware and repositioned on Lt side to keep pressure off buttocks Individual Therapy  Pt resting in bed upon arrival with guests (family) present.  Pt greeted therapist appropriately but mistakenly thought he had worked with this therapist earlier.  After donning pants pt engaged in sitting EOB and sit<>stand.  Pt required mod instructional cues for sequencing when going from supine->sit EOB.  Pt required min A and min verbal cues to maintain sitting balance EOB.  Pt introduced all of guests correctly while seated EOB and washed face with wash cloth.  Pt performed sit<>stand from elevated bed X 3 requiring mod A but was unable to come to complete upright position and could only maintain for approx 5 seconds.  Pt returned to bed laying on left side to relieve pressure on buttocks.  Pt repositioned X 3 but patient continues to role over onto back.   Elijah [name], Elijah Osborn [facility] 09/23/2013, 3:21 PM

## 2013-09-23 NOTE — Progress Notes (Signed)
Nose bleed this morning. Difficult to keep off back/buttocks, patient returns to back after turning. Bilateral PRAFO boots in place. Alfredo MartinezMurray, Ronne Stefanski A

## 2013-09-24 ENCOUNTER — Inpatient Hospital Stay (HOSPITAL_COMMUNITY): Payer: Medicare Other | Admitting: Speech Pathology

## 2013-09-24 ENCOUNTER — Inpatient Hospital Stay (HOSPITAL_COMMUNITY): Payer: Medicare Other | Admitting: Occupational Therapy

## 2013-09-24 ENCOUNTER — Inpatient Hospital Stay (HOSPITAL_COMMUNITY): Payer: Medicare Other

## 2013-09-24 DIAGNOSIS — I6340 Cerebral infarction due to embolism of unspecified cerebral artery: Secondary | ICD-10-CM

## 2013-09-24 LAB — GLUCOSE, CAPILLARY
Glucose-Capillary: 130 mg/dL — ABNORMAL HIGH (ref 70–99)
Glucose-Capillary: 204 mg/dL — ABNORMAL HIGH (ref 70–99)
Glucose-Capillary: 180 mg/dL — ABNORMAL HIGH (ref 70–99)
Glucose-Capillary: 185 mg/dL — ABNORMAL HIGH (ref 70–99)

## 2013-09-24 LAB — HEPATIC FUNCTION PANEL
ALT: 56 U/L — ABNORMAL HIGH (ref 0–53)
AST: 57 U/L — ABNORMAL HIGH (ref 0–37)
Albumin: 1.8 g/dL — ABNORMAL LOW (ref 3.5–5.2)
Alkaline Phosphatase: 1161 U/L — ABNORMAL HIGH (ref 39–117)
Bilirubin, Direct: 0.2 mg/dL (ref 0.0–0.3)
Indirect Bilirubin: 0.3 mg/dL (ref 0.3–0.9)
Total Bilirubin: 0.5 mg/dL (ref 0.3–1.2)
Total Protein: 7.3 g/dL (ref 6.0–8.3)

## 2013-09-24 MED ORDER — INSULIN DETEMIR 100 UNIT/ML ~~LOC~~ SOLN
25.0000 [IU] | Freq: Every day | SUBCUTANEOUS | Status: DC
  Administered 2013-09-24 – 2013-10-05 (×11): 25 [IU] via SUBCUTANEOUS
  Filled 2013-09-24 (×14): qty 0.25

## 2013-09-24 NOTE — Progress Notes (Signed)
Inpatient Diabetes Program Recommendations  AACE/ADA: New Consensus Statement on Inpatient Glycemic Control (2013)  Target Ranges:  Prepandial:   less than 140 mg/dL      Peak postprandial:   less than 180 mg/dL (1-2 hours)      Critically ill patients:  140 - 180 mg/dL   Reason for Visit: Post-prandial blood sugars elevated  Results for Elijah [name], Elijah [name] (MRN [id number]) as of 09/24/2013 13:54  Ref. Range 09/23/2013 11:28 09/23/2013 16:45 09/23/2013 20:57 09/24/2013 07:03 09/24/2013 11:15  Glucose-Capillary Latest Range: 70-99 mg/dL 409158 (H) 811270 (H) 914215 (H) 130 (H) 204 (H)   Morning blood sugars acceptable. Post-prandial blood sugars elevated.  Consider addition of Novolog 4 units tidwc for meal coverage insulin.  Will continue to follow. Thank you. Ailene Ardshonda Cayla Wiegand, RD, LDN, CDE Inpatient Diabetes Coordinator [phone]

## 2013-09-24 NOTE — Progress Notes (Addendum)
Speech Language Pathology Daily Session Note  Patient Details  Name: Elijah [name] MRN: [id number] Date of Birth: 11/26/1933  Today's Date: 09/24/2013 Time: [id number] Time Calculation (min): 48 min  Short Term Goals: Week 2: SLP Short Term Goal 1 (Week 2): Pt will utilize safe swallow strategies while consuming Dys 1 textures and nectar-thick liquids with Min cues SLP Short Term Goal 2 (Week 2): Pt will utilize speech intelligibility strategies at the phrase level with Min cues SLP Short Term Goal 3 (Week 2): Pt will sustain attention to basic familiar task for 10 minutes with Mod cues SLP Short Term Goal 4 (Week 2): Pt will verbalize at least 1cognitive and 1 physical impairment with Mod cues SLP Short Term Goal 5 (Week 2): Pt will demonstrate basic problem solving during basic, familiar task with Min cues SLP Short Term Goal 6 (Week 2): Pt will utilize external memory aids to recall new and daily information with Mod cues  Skilled Therapeutic Interventions: Skilled treatment session focused on addressing cognitive goals. Patient initially sleeping with family at bedside.  Patient required Max multimodal cues for arousal and orientation.  Due to pain patient refused to get out of bed for therapy; patient required Max multimodal cues to utilize call bell to request pain medication.  Patient awake and participatory for 1-2 minute increments with Max cues for sustained attention to basic, familiar task.  Patient reported sitting up to be too painful and that he was tired and would do therapy tomorrow after he rested; as a result, session ended early and patient missed 12 minutes of skilled therapy.  Continue with current plan of care.   FIM:  Comprehension Comprehension Mode: Auditory Comprehension: 3-Understands basic 50 - 74% of the time/requires cueing 25 - 50%  of the time Expression Expression Mode: Verbal Expression: 3-Expresses basic 50 - 74% of the time/requires cueing 25 - 50% of  the time. Needs to repeat parts of sentences. Social Interaction Social Interaction: 2-Interacts appropriately 25 - 49% of time - Needs frequent redirection. Problem Solving Problem Solving: 2-Solves basic 25 - 49% of the time - needs direction more than half the time to initiate, plan or complete simple activities Memory Memory: 2-Recognizes or recalls 25 - 49% of the time/requires cueing 51 - 75% of the time FIM - Eating Eating Activity: 1: Helper performs IV, parenteral, or tube feeding  Pain Pain Assessment Pain Assessment: 0-10 Pain Score: 8  Pain Location: Back Pain Orientation: Lower;Mid Pain Onset: On-going Patients Stated Pain Goal: 4 Pain Intervention(s): Medication (See eMAR), RN notified   Therapy/Group: Individual Therapy  Charlane FerrettiMelissa Gladis Soley, M.A., CCC-SLP [phone]   Elijah [name] 09/24/2013, 1:55 PM

## 2013-09-24 NOTE — Progress Notes (Signed)
Subjective/Complaints: Denies any issues. In good spirits.   Review of Systems - limited secondary to mental status Objective: Vital Signs: Blood pressure 119/81, pulse 87, temperature 97.9 F (36.6 C), temperature source Oral, resp. rate 18, weight 85.594 kg (188 lb 11.2 oz), SpO2 97.00%. No results found. Results for orders placed during the hospital encounter of 09/11/13 (from the past 72 hour(s))  GLUCOSE, CAPILLARY     Status: Abnormal   Collection Time    09/21/13 11:14 AM      Result Value Ref Range   Glucose-Capillary 245 (*) 70 - 99 mg/dL   Comment 1 Notify RN    GLUCOSE, CAPILLARY     Status: Abnormal   Collection Time    09/21/13  4:40 PM      Result Value Ref Range   Glucose-Capillary 288 (*) 70 - 99 mg/dL   Comment 1 Notify RN    GLUCOSE, CAPILLARY     Status: Abnormal   Collection Time    09/21/13  8:43 PM      Result Value Ref Range   Glucose-Capillary 223 (*) 70 - 99 mg/dL   Comment 1 Notify RN    CBC     Status: Abnormal   Collection Time    09/22/13  4:30 AM      Result Value Ref Range   WBC 12.8 (*) 4.0 - 10.5 K/uL   RBC 2.62 (*) 4.22 - 5.81 MIL/uL   Hemoglobin 8.0 (*) 13.0 - 17.0 g/dL   HCT 24.9 (*) 39.0 - 52.0 %   MCV 95.0  78.0 - 100.0 fL   MCH 30.5  26.0 - 34.0 pg   MCHC 32.1  30.0 - 36.0 g/dL   RDW 16.6 (*) 11.5 - 15.5 %   Platelets 209  150 - 400 K/uL  GLUCOSE, CAPILLARY     Status: Abnormal   Collection Time    09/22/13  7:13 AM      Result Value Ref Range   Glucose-Capillary 187 (*) 70 - 99 mg/dL   Comment 1 Notify RN    GLUCOSE, CAPILLARY     Status: Abnormal   Collection Time    09/22/13 11:31 AM      Result Value Ref Range   Glucose-Capillary 236 (*) 70 - 99 mg/dL   Comment 1 Notify RN    GLUCOSE, CAPILLARY     Status: Abnormal   Collection Time    09/22/13  4:31 PM      Result Value Ref Range   Glucose-Capillary 293 (*) 70 - 99 mg/dL   Comment 1 Notify RN    GLUCOSE, CAPILLARY     Status: Abnormal   Collection Time   09/22/13  8:57 PM      Result Value Ref Range   Glucose-Capillary 164 (*) 70 - 99 mg/dL   Comment 1 Notify RN    GLUCOSE, CAPILLARY     Status: Abnormal   Collection Time    09/23/13  7:02 AM      Result Value Ref Range   Glucose-Capillary 163 (*) 70 - 99 mg/dL   Comment 1 Notify RN    CBC WITH DIFFERENTIAL     Status: Abnormal   Collection Time    09/23/13  8:30 AM      Result Value Ref Range   WBC 14.6 (*) 4.0 - 10.5 K/uL   RBC 2.56 (*) 4.22 - 5.81 MIL/uL   Hemoglobin 7.8 (*) 13.0 - 17.0 g/dL   HCT 24.4 (*)  39.0 - 52.0 %   MCV 95.3  78.0 - 100.0 fL   MCH 30.5  26.0 - 34.0 pg   MCHC 32.0  30.0 - 36.0 g/dL   RDW 16.7 (*) 11.5 - 15.5 %   Platelets 220  150 - 400 K/uL   Neutrophils Relative % 74  43 - 77 %   Neutro Abs 10.8 (*) 1.7 - 7.7 K/uL   Lymphocytes Relative 18  12 - 46 %   Lymphs Abs 2.7  0.7 - 4.0 K/uL   Monocytes Relative 5  3 - 12 %   Monocytes Absolute 0.7  0.1 - 1.0 K/uL   Eosinophils Relative 3  0 - 5 %   Eosinophils Absolute 0.4  0.0 - 0.7 K/uL   Basophils Relative 0  0 - 1 %   Basophils Absolute 0.0  0.0 - 0.1 K/uL  GLUCOSE, CAPILLARY     Status: Abnormal   Collection Time    09/23/13 11:28 AM      Result Value Ref Range   Glucose-Capillary 158 (*) 70 - 99 mg/dL   Comment 1 Notify RN    GLUCOSE, CAPILLARY     Status: Abnormal   Collection Time    09/23/13  4:45 PM      Result Value Ref Range   Glucose-Capillary 270 (*) 70 - 99 mg/dL   Comment 1 Notify RN    GLUCOSE, CAPILLARY     Status: Abnormal   Collection Time    09/23/13  8:57 PM      Result Value Ref Range   Glucose-Capillary 215 (*) 70 - 99 mg/dL   Comment 1 Notify RN    HEPATIC FUNCTION PANEL     Status: Abnormal   Collection Time    09/24/13  5:03 AM      Result Value Ref Range   Total Protein 7.3  6.0 - 8.3 g/dL   Albumin 1.8 (*) 3.5 - 5.2 g/dL   AST 57 (*) 0 - 37 U/L   ALT 56 (*) 0 - 53 U/L   Alkaline Phosphatase 1161 (*) 39 - 117 U/L   Total Bilirubin 0.5  0.3 - 1.2 mg/dL    Bilirubin, Direct 0.2  0.0 - 0.3 mg/dL   Indirect Bilirubin 0.3  0.3 - 0.9 mg/dL  GLUCOSE, CAPILLARY     Status: Abnormal   Collection Time    09/24/13  7:03 AM      Result Value Ref Range   Glucose-Capillary 130 (*) 70 - 99 mg/dL   Comment 1 Notify RN       HEENT: normal Cardio: irregular and no murmurs Resp: CTA B/L and unlabored GI: BS positive and large bilateral inguinal mass, nontender Extremity:  No Edema  Neuro: Confused, , Abnormal Motor 4/5 in BUE, 3 to 3+/5 in BLE, Abnormal FMC Ataxic/ dec FMC, Dysarthric and Apraxic. More alert Musc/Skel:  Normal Gen NAD Skin:  Heels red  Assessment/Plan: 1. Functional deficits secondary to bilateral cerebral infarct embolic which require 3+ hours per day of interdisciplinary therapy in a comprehensive inpatient rehab setting. Physiatrist is providing close team supervision and 24 hour management of active medical problems listed below. Physiatrist and rehab team continue to assess barriers to discharge/monitor patient progress toward functional and medical goals.  FIM: FIM - Bathing Bathing Steps Patient Completed: Chest;Right Arm;Left Arm;Abdomen (supine & roll for peri area & buttocks) Bathing: 2: Max-Patient completes 3-4 76f10 parts or 25-49%  FIM - Upper Body Dressing/Undressing Upper body  dressing/undressing steps patient completed: Put head through opening of pull over shirt/dress;Thread/unthread right sleeve of pullover shirt/dresss Upper body dressing/undressing: 3: Mod-Patient completed 50-74% of tasks FIM - Lower Body Dressing/Undressing Lower body dressing/undressing steps patient completed: Pull pants up/down (bridging in bed to pull up pants) Lower body dressing/undressing: 1: Total-Patient completed less than 25% of tasks  FIM - Toileting Toileting: 0: Activity did not occur  FIM - Radio producer Devices: Bedside commode;Grab bars Toilet Transfers: 0-Activity did not occur  FIM -  Control and instrumentation engineer Devices: Bed rails;Arm rests Bed/Chair Transfer: 3: Supine > Sit: Mod A (lifting assist/Pt. 50-74%/lift 2 legs;3: Bed > Chair or W/C: Mod A (lift or lower assist)  FIM - Locomotion: Wheelchair Distance: 75 Locomotion: Wheelchair: 0: Activity did not occur FIM - Locomotion: Ambulation Locomotion: Ambulation Assistive Devices:  (unable to safely perform) Locomotion: Ambulation: 0: Activity did not occur  Comprehension Comprehension Mode: Auditory Comprehension: 4-Understands basic 75 - 89% of the time/requires cueing 10 - 24% of the time  Expression Expression Mode: Verbal Expression: 3-Expresses basic 50 - 74% of the time/requires cueing 25 - 50% of the time. Needs to repeat parts of sentences.  Social Interaction Social Interaction: 4-Interacts appropriately 75 - 89% of the time - Needs redirection for appropriate language or to initiate interaction.  Problem Solving Problem Solving: 3-Solves basic 50 - 74% of the time/requires cueing 25 - 49% of the time  Memory Memory: 2-Recognizes or recalls 25 - 49% of the time/requires cueing 51 - 75% of the time  Medical Problem List and Plan:  1. Functional deficits secondary to acute cerebral white matter bilateral infarct  2. DVT Prophylaxis/Anticoagulation:off Eliquis per GI due to liver failure, change to ASA until ok to resume Eliquis. Off lovenox due to bleeding 3. Pain Management: Tylenol as needed  4. Dysphagia with decreased nutritional storage. Dysphagia 1 nectar liquids. Followup speech therapy monitor hydration. Consider appetite stimulant  5. Neuropsych: This patient is not capable of making decisions on his own behalf.  6. Hypertension/atrial fibrillation. Norvasc 10 mg daily, hydralazine 10 mg 4 times a day, metoprolol 25 mg every 8 hours. Monitor with increased mobility  7 ID/methicillin sensitive staph aureus. Continue cefazolin 2 g intravenously every 8 hours x4 weeks total  initiated 08/29/2013.   8. Diabetes mellitus with peripheral neuropathy.    -increase Levemir to 25 units each bedtime.  9. Integumentary: local skin care, turning, maintain foley for now, mattress overlay, WOC, Diflucan for yeast PRAFOs for heels 10.  Inguinal mass, bilateral hernia, obviously chronic and not incarcerated, given recent CVA, no elective surg recommended at this time 11.  Elevated alk phos, off Lipitor,  ? lipitor vs autoimmune with increased IgG and IgA, liver ultrasound OK, appreciate close GI f/u  -AP down to 1100 today 12.  Epistaxis d/c lovenox and apply local measures, afrin,  LOS (Days) 13 A FACE TO FACE EVALUATION WAS PERFORMED  Courtne Lighty T 09/24/2013, 8:49 AM

## 2013-09-24 NOTE — Progress Notes (Signed)
Occupational Therapy Session Note  Patient Details  Name: Elijah [name] MRN: [id number] Date of Birth: 1933-04-08  Today's Date: 09/24/2013 Time: 0800-0900 Time Calculation (min): 60 min  Short Term Goals: Week 1:  OT Short Term Goal 1 (Week 1): Pt will be able to sit EOB with mod A x 1 for 10 min to engage in UB bathing and dressing. OT Short Term Goal 1 - Progress (Week 1): Met OT Short Term Goal 2 (Week 1): Pt will be able to bathe his UB with min A. OT Short Term Goal 2 - Progress (Week 1): Met OT Short Term Goal 3 (Week 1): Pt will be able to don a tshirt with mod A. OT Short Term Goal 3 - Progress (Week 1): Met OT Short Term Goal 4 (Week 1): Pt will be able to transfer to w/c from bed with max A x1. OT Short Term Goal 4 - Progress (Week 1): Progressing toward goal OT Short Term Goal 5 (Week 1): Pt will demonstrate improved direction following during simple grooming with min cues. OT Short Term Goal 5 - Progress (Week 1): Met Week 2:  OT Short Term Goal 1 (Week 2): Groom:  complete 3 groom tasks while seated in w/c at sink OT Short Term Goal 2 (Week 2): Bath:  8/10 tasks sitting EOB with setup/cues OT Short Term Goal 3 (Week 2): LB Dressing:  Patient will be close supervision to donn feet into pants while sitting EOB. OT Short Term Goal 4 (Week 2): Bed Mobility:  Min assist for side lying to sit without bed rail and with HOB down to prepare for BADL at EOB.  Skilled Therapeutic Interventions/Progress Updates:      Pt seen for BADL retraining of toileting, bathing, and dressing with a focus on attention to task, dynamic sitting balance, trunk control.  Pt was asleep in bed, but awoke easily. He requested to dress from lying down and stay in bed. With encouragement, pt agreed to sit EOB. Pt initially had brief donned from supine with pt actively rolling left and right without assistance. He was able to sit to EOB with min A to guide legs off bed and to push with arms without using arm  rail.   Immediately pt c/o low back pain in sitting. Pt stated he has chronic back problems and it is very uncomfortable to sit EOB. He was able to maintain his balance with S.  Pt tried to lie back down several times, so used quick distraction intervention to keep pt focused. Pt bathed UB and donned tshirt without A.  He attempted to don short over feet, but his back was too painful to bend forward. To pull pants over feet, pt wanted to bridge in supine. Attempted to have pt stand, but he was not willing to try. He did work on full lateral leans to pull pants over hips with A.  Pt then demonstrated excellent trunk control, strength, initiation, attention, and praxis to use SB to w/c with only min A to steady pt. He was also able to adjust his hips in w/c with just one cue. Pt was adjusted in tilt position in tilt and space w/c with QRB so he could rest before next session. Pt provided with heat pad to low back. Pt given call light and pt was able to identify which button to push to call nurse if needed.  Pt's nurse tech informed that pt in chair in room.  Therapy Documentation Precautions:  Precautions Precautions:  Fall Precaution Comments: pt resists movement (posterior lean) Restrictions Weight Bearing Restrictions: No       Pain: Pain Assessment Faces Pain Scale: Hurts even more Pain Type: Acute pain Pain Location: Back Pain Orientation: Lower Pain Descriptors / Indicators: Aching Pain Onset: On-going Pain Intervention(s): RN made aware;Repositioned;Heat applied;Distraction  See FIM for current functional status  Therapy/Group: Individual Therapy  [place] 09/24/2013, 9:29 AM

## 2013-09-24 NOTE — Progress Notes (Signed)
Physical Therapy Session Note  Patient Details  Name: Elijah CardJames A Filla MRN: [id number] Date of Birth: 03/15/1934  Today's Date: 09/24/2013 Time: 1005-1105 Time Calculation (min): 60 min  Short Term Goals: Week 2:  PT Short Term Goal 1 (Week 2): pt will move supine > sit with min assist PT Short Term Goal 2 (Week 2): pt will transfer with assist of 1 person, > 50% of attempts PT Short Term Goal 3 (Week 2): pt will propel w/c (regular) x 25' with mod assist,  PT Short Term Goal 4 (Week 2): pt will perform gait x 10' with assist of 2 people, 4 out of 5 days  Skilled Therapeutic Interventions/Progress Updates:  Pt alert and oriented; requested to go back to bed due to painful buttocks and chronic LBP, but able to be re-directed repeatedly in order to participate for full session. +2 assist for safe mobility and management of IV pole.  Unable to rate pain.  neuromuscular re-education via forced use, VCS, manual cues, wt bearing during use of Sara Plus for sit>< stand repeatedly and gait.  Pt assisted with sit> stand around 50%.  He stood x 4, maximum of 3 minutes.  Gait using [place] DecSara Plus x 12' x 2, +2 assist to manage IV and [place] DecSara.  Posture in [place] Plus forward flexed, bil knees flexed, but knees did not buckle during gait.  Therapeutic activity in standing in [place]ara Plus, playing horseshoes which he threw with R hand, x 2 rounds x 2-3 minutes.. Pt demonstrated appropriate turn -taking and reactions to his accuracy with throwing. He was unable to attend to keep score.  Returned to bed via [place]og[place]Sara Plus,+2 assist.  K pad placed under low back and pt positioned for comfort in bed.  Call bell placed within reach and pt demonstrated appropriate use of it when requested. Bed alarm set.    Therapy Documentation Precautions:  Precautions Precautions: Fall Precaution Comments: pt resists movement (posterior lean) Restrictions Weight Bearing Restrictions: No   Pain: Pain Assessment Faces Pain Scale: Hurts  even more Pain Type: Acute pain Pain Location: Back Pain Orientation: Lower Pain Descriptors / Indicators: Aching Pain Onset: On-going Pain Intervention(s): RN made aware;Repositioned;Heat applied;Distraction   Locomotion : Ambulation Ambulation/Gait Assistance: 1: +2 Total assist     See FIM for current functional status  Therapy/Group: Individual Therapy  Bransen Fassnacht 09/24/2013, 12:24 PM

## 2013-09-25 ENCOUNTER — Inpatient Hospital Stay (HOSPITAL_COMMUNITY): Payer: Medicare Other | Admitting: Speech Pathology

## 2013-09-25 ENCOUNTER — Inpatient Hospital Stay (HOSPITAL_COMMUNITY): Payer: Medicare Other | Admitting: *Deleted

## 2013-09-25 ENCOUNTER — Inpatient Hospital Stay (HOSPITAL_COMMUNITY): Payer: Medicare Other | Admitting: Occupational Therapy

## 2013-09-25 LAB — CBC
HCT: 24.4 % — ABNORMAL LOW (ref 39.0–52.0)
Hemoglobin: 7.8 g/dL — ABNORMAL LOW (ref 13.0–17.0)
MCH: 30.4 pg (ref 26.0–34.0)
MCHC: 32 g/dL (ref 30.0–36.0)
MCV: 94.9 fL (ref 78.0–100.0)
Platelets: 221 10*3/uL (ref 150–400)
RBC: 2.57 MIL/uL — ABNORMAL LOW (ref 4.22–5.81)
RDW: 16.6 % — ABNORMAL HIGH (ref 11.5–15.5)
WBC: 13 10*3/uL — ABNORMAL HIGH (ref 4.0–10.5)

## 2013-09-25 LAB — GLUCOSE, CAPILLARY
Glucose-Capillary: 112 mg/dL — ABNORMAL HIGH (ref 70–99)
Glucose-Capillary: 196 mg/dL — ABNORMAL HIGH (ref 70–99)
Glucose-Capillary: 155 mg/dL — ABNORMAL HIGH (ref 70–99)
Glucose-Capillary: 169 mg/dL — ABNORMAL HIGH (ref 70–99)

## 2013-09-25 MED ORDER — PRO-STAT SUGAR FREE PO LIQD
30.0000 mL | Freq: Two times a day (BID) | ORAL | Status: DC
  Administered 2013-09-25 – 2013-09-29 (×7): 30 mL via ORAL
  Filled 2013-09-25 (×30): qty 30

## 2013-09-25 NOTE — Progress Notes (Addendum)
Pt has an episode of nose bleed and it was stop by putting pressure on it. Elijah [name] was made aware

## 2013-09-25 NOTE — Progress Notes (Signed)
NUTRITION FOLLOW UP  DOCUMENTATION CODES Per approved criteria  -Severe malnutrition in the context of acute illness or injury   INTERVENTION:  Recommend addition of Pro-Stat BID  Continue Ensure Pudding po TID, each supplement provides 170 kcal and 4 grams of protein RD to follow for nutrition care plan  NUTRITION DIAGNOSIS: Inadequate oral intake related to CVA as evidenced by 10% meal intake, ongoing  Goal: Patient will meet >/=90% of estimated nutrition needs, progressing  Monitor:  PO & supplemental intake, weight, labs, I/O's  ASSESSMENT: 80 year old right-handed Caucasian male with history of diabetes mellitus with peripheral neuropathy. Patient independent and active prior to admission. [facility]/2013 with slurred speech as well as diffuse weakness.  Patient continues on a Dys 1, nectar-thick liquid diet.  PO intake remains variable at 0-90% per flowsheet records.  Overall improved.  Taking his Ensure Pudding supplements.  CWOCN follow up note reviewed 6/30.  2 areas with MASD to upper buttocks have declined.    GI following for elevated AP/AST/ALT.  7/07: -Per discussion with RN, pt with improving appetite. Consumed 100% of breakfast, and 25% of lunch -Is tolerating Ensure pudding TID. RN noted pt enjoys taste and he finishes 100% of supplement by consuming them with his medications -As pt not consistently consuming <75% of meals, discussed addition of Pro-Stat protein supplement with RN. Encouraged to mix with pudding/applesauce. RN in agreement -Pt with constipation, which is also likely contributing to decreased intake. RN disimpacted pt on 7/04 and noted  small amount of very soft stool. Consider addition of laxative to assist with bowel regularity -SLP evaluation on 7/06, continue with current diet textures   Height: Ht Readings from Last 1 Encounters:  09/11/13 6\' 2"  (1.88 m)    Weight: -----> trending down Wt  Readings from Last 1 Encounters:  09/12/13 188 lb 11.2 oz (85.594 kg)  6/23  198 lb 6/19  207 lb  BMI:  Body mass index is 24.22 kg/(m^2).  Estimated Nutritional Needs: Kcal: 2050-2200 kcal Protein: 110-125 g Fluid: >2.7 L/day  Skin: Excoriated area on buttocks/sacrum, deep tissue injury right heel  Diet Order: Dysphagia 1, nectar thick   Intake/Output Summary (Last 24 hours) at 09/25/13 1520 Last data filed at 09/25/13 0645  Gross per 24 hour  Intake    120 ml  Output   1925 ml  Net  -1805 ml    Labs:  No results found for this basename: NA, K, CL, CO2, BUN, CREATININE, CALCIUM, MG, PHOS, GLUCOSE,  in the last 168 hours  CBG (last 3)   Recent Labs  09/24/13 2041 09/25/13 0708 09/25/13 1116  GLUCAP 185* 112* 196*    Scheduled Meds: . amLODipine  10 mg Oral Daily  . antiseptic oral rinse  15 mL Mouth Rinse q12n4p  . aspirin  81 mg Oral Daily  .  ceFAZolin (ANCEF) IV  2 g Intravenous 3 times per day  . chlorhexidine  15 mL Mouth Rinse BID  . collagenase   Topical Daily  . feeding supplement (ENSURE)  1 Container Oral TID BM  . fluconazole  100 mg Oral Daily  . hydrALAZINE  10 mg Oral 4 times per day  . insulin aspart  0-15 Units Subcutaneous TID WC  . insulin aspart  0-5 Units Subcutaneous QHS  . insulin detemir  25 Units Subcutaneous QHS  . metoprolol tartrate  25 mg Oral 3 times per day    Continuous Infusions: . 0.45 % NaCl with KCl  20 mEq / L 75 mL/hr at 09/25/13 0850    Lloyd HugerSarah F Robley Matassa MS RD LDN Clinical Dietitian Pager:[phone]

## 2013-09-25 NOTE — Progress Notes (Signed)
Speech Language Pathology Daily Session Note  Patient Details  Name: Elijah CardJames A Graziosi MRN: [id number] Date of Birth: 08/22/1933  Today's Date: 09/25/2013 Time: 1010-1110 Time Calculation (min): 60 min  Short Term Goals: Week 2: SLP Short Term Goal 1 (Week 2): Pt will utilize safe swallow strategies while consuming Dys 1 textures and nectar-thick liquids with Min cues SLP Short Term Goal 2 (Week 2): Pt will utilize speech intelligibility strategies at the phrase level with Min cues SLP Short Term Goal 3 (Week 2): Pt will sustain attention to basic familiar task for 10 minutes with Mod cues SLP Short Term Goal 4 (Week 2): Pt will verbalize at least 1cognitive and 1 physical impairment with Mod cues SLP Short Term Goal 5 (Week 2): Pt will demonstrate basic problem solving during basic, familiar task with Min cues SLP Short Term Goal 6 (Week 2): Pt will utilize external memory aids to recall new and daily information with Mod cues  Skilled Therapeutic Interventions: Skilled treatment session focused on addressing cognitive-linguistic goals. Patient required physical assist from RN and Max verbal cues from SLP to problem solve and sequence transfer from bed to wheelchair.  Patient was asked to name and write items that would be found at the grocery store for each letter of the alphabet. Patient required Mod verbal cues to utilize external aid to recall place during a structured categorical naming task, sustain attention to task for ~10 minute increments.  Patient required Mod cues to utilize slow, over articulated speech throughout session for word-phrase level speech intelligibility.  Continue with current plan of care.    FIM:  Comprehension Comprehension Mode: Auditory Comprehension: 4-Understands basic 75 - 89% of the time/requires cueing 10 - 24% of the time Expression Expression Mode: Verbal Expression: 3-Expresses basic 50 - 74% of the time/requires cueing 25 - 50% of the time. Needs to  repeat parts of sentences. Social Interaction Social Interaction: 2-Interacts appropriately 25 - 49% of time - Needs frequent redirection. Problem Solving Problem Solving: 2-Solves basic 25 - 49% of the time - needs direction more than half the time to initiate, plan or complete simple activities Memory Memory: 2-Recognizes or recalls 25 - 49% of the time/requires cueing 51 - 75% of the time  Pain Pain Assessment Pain Assessment: 0-10 Pain Score: 6  Pain Location: Back Pain Orientation: Lower Pain Descriptors / Indicators: Aching Pain Onset: On-going Pain Intervention(s): Repositioned;RN made aware  Therapy/Group: Individual Therapy  Charlane FerrettiMelissa Vanessa Alesi, M.A., CCC-SLP [phone]  Freddie Dymek 09/25/2013, 1:23 PM

## 2013-09-25 NOTE — Progress Notes (Signed)
Occupational Therapy Session Note  Patient Details  Name: Elijah [name] MRN: [id number] Date of Birth: 1933/12/06  Today's Date: 09/25/2013 Time: 0806-0902 Time Calculation (min): 56 min  Short Term Goals: Week 1:  OT Short Term Goal 1 (Week 1): Pt will be able to sit EOB with mod A x 1 for 10 min to engage in UB bathing and dressing. OT Short Term Goal 1 - Progress (Week 1): Met OT Short Term Goal 2 (Week 1): Pt will be able to bathe his UB with min A. OT Short Term Goal 2 - Progress (Week 1): Met OT Short Term Goal 3 (Week 1): Pt will be able to don a tshirt with mod A. OT Short Term Goal 3 - Progress (Week 1): Met OT Short Term Goal 4 (Week 1): Pt will be able to transfer to w/c from bed with max A x1. OT Short Term Goal 4 - Progress (Week 1): Progressing toward goal OT Short Term Goal 5 (Week 1): Pt will demonstrate improved direction following during simple grooming with min cues. OT Short Term Goal 5 - Progress (Week 1): Met Week 2:  OT Short Term Goal 1 (Week 2): Groom:  complete 3 groom tasks while seated in w/c at sink OT Short Term Goal 2 (Week 2): Bath:  8/10 tasks sitting EOB with setup/cues OT Short Term Goal 3 (Week 2): LB Dressing:  Patient will be close supervision to donn feet into pants while sitting EOB. OT Short Term Goal 4 (Week 2): Bed Mobility:  Min assist for side lying to sit without bed rail and with HOB down to prepare for BADL at EOB.  Skilled Therapeutic Interventions/Progress Updates:    Pt seen for BADL retraining of bathing and dressing with a focus on attention to task, initiation, problem solving, active use of all extremities. Pt lying in bed at start of session. Pt was agreeable to getting bathed and dressed. Pt sat to EOB with min A and to guide trunk forward to move from sidelying to sit and cues to move feet off bed first. Pt was able to sit EOB for several minutes with supervision as chair and cushion were set up. Pt wanted to try a transfer without  a SB. He did extremely well moving his trunk forward and weight shifting to elevate his hips to complete a squat pivot to the chair with min A.  Once in chair, pt actively engaged in bathing and was able to reach towards his shins to wash his legs.  He needed more A to don pants over R foot from catheter bag and the type of pants with a liner.  He needed mod cues to problem solve how to put L foot into pants.  Pt was asked to stand up to pull up his pants. He used BUE to push off arm rests and came into a tall stand with only min A and pulled pants over hips 80% of the way. Pt did not want to stand for long. Pt encouraged to stand again and he did with only min a and was able to fully stand upright without trunk flexion.  Pt stated he wanted to work on his crossword puzzles. Pt's chair set up with QRB and table in front with puzzle books. Pt had call light on table. Nurse and NT aware pt in the room.  Therapy Documentation Precautions:  Precautions Precautions: Fall Precaution Comments: pt resists movement (posterior lean) Restrictions Weight Bearing Restrictions: No  General Amount of  Missed OT Time (min): 4 Minutes Vital Signs: Therapy Vitals Temp: 98.1 F (36.7 C) Temp src: Oral Pulse Rate: 97 Resp: 18 BP: 131/77 mmHg Patient Position (if appropriate): Lying Oxygen Therapy SpO2: 97 % O2 Device: None (Room air) Pain: Pain Assessment Pain Assessment: 0-10 Pain Score: 5  Pain Location: Back Pain Orientation: Lower Pain Onset: On-going Pain Intervention(s): Medication (See eMAR)  See FIM for current functional status  Therapy/Group: Individual Therapy  [place] 09/25/2013, 9:23 AM

## 2013-09-25 NOTE — Progress Notes (Signed)
Subjective/Complaints: Rested well. Denies pain.  Review of Systems - limited secondary to mental status  Objective: Vital Signs: Blood pressure 131/77, pulse 97, temperature 98.1 F (36.7 C), temperature source Oral, resp. rate 18, weight 85.594 kg (188 lb 11.2 oz), SpO2 97.00%. No results found. Results for orders placed during the hospital encounter of 09/11/13 (from the past 72 hour(s))  GLUCOSE, CAPILLARY     Status: Abnormal   Collection Time    09/22/13 11:31 AM      Result Value Ref Range   Glucose-Capillary 236 (*) 70 - 99 mg/dL   Comment 1 Notify RN    GLUCOSE, CAPILLARY     Status: Abnormal   Collection Time    09/22/13  4:31 PM      Result Value Ref Range   Glucose-Capillary 293 (*) 70 - 99 mg/dL   Comment 1 Notify RN    GLUCOSE, CAPILLARY     Status: Abnormal   Collection Time    09/22/13  8:57 PM      Result Value Ref Range   Glucose-Capillary 164 (*) 70 - 99 mg/dL   Comment 1 Notify RN    GLUCOSE, CAPILLARY     Status: Abnormal   Collection Time    09/23/13  7:02 AM      Result Value Ref Range   Glucose-Capillary 163 (*) 70 - 99 mg/dL   Comment 1 Notify RN    CBC WITH DIFFERENTIAL     Status: Abnormal   Collection Time    09/23/13  8:30 AM      Result Value Ref Range   WBC 14.6 (*) 4.0 - 10.5 K/uL   RBC 2.56 (*) 4.22 - 5.81 MIL/uL   Hemoglobin 7.8 (*) 13.0 - 17.0 g/dL   HCT 24.4 (*) 39.0 - 52.0 %   MCV 95.3  78.0 - 100.0 fL   MCH 30.5  26.0 - 34.0 pg   MCHC 32.0  30.0 - 36.0 g/dL   RDW 16.7 (*) 11.5 - 15.5 %   Platelets 220  150 - 400 K/uL   Neutrophils Relative % 74  43 - 77 %   Neutro Abs 10.8 (*) 1.7 - 7.7 K/uL   Lymphocytes Relative 18  12 - 46 %   Lymphs Abs 2.7  0.7 - 4.0 K/uL   Monocytes Relative 5  3 - 12 %   Monocytes Absolute 0.7  0.1 - 1.0 K/uL   Eosinophils Relative 3  0 - 5 %   Eosinophils Absolute 0.4  0.0 - 0.7 K/uL   Basophils Relative 0  0 - 1 %   Basophils Absolute 0.0  0.0 - 0.1 K/uL  GLUCOSE, CAPILLARY     Status: Abnormal   Collection Time    09/23/13 11:28 AM      Result Value Ref Range   Glucose-Capillary 158 (*) 70 - 99 mg/dL   Comment 1 Notify RN    GLUCOSE, CAPILLARY     Status: Abnormal   Collection Time    09/23/13  4:45 PM      Result Value Ref Range   Glucose-Capillary 270 (*) 70 - 99 mg/dL   Comment 1 Notify RN    GLUCOSE, CAPILLARY     Status: Abnormal   Collection Time    09/23/13  8:57 PM      Result Value Ref Range   Glucose-Capillary 215 (*) 70 - 99 mg/dL   Comment 1 Notify RN    HEPATIC FUNCTION PANEL  Status: Abnormal   Collection Time    09/24/13  5:03 AM      Result Value Ref Range   Total Protein 7.3  6.0 - 8.3 g/dL   Albumin 1.8 (*) 3.5 - 5.2 g/dL   AST 57 (*) 0 - 37 U/L   ALT 56 (*) 0 - 53 U/L   Alkaline Phosphatase 1161 (*) 39 - 117 U/L   Total Bilirubin 0.5  0.3 - 1.2 mg/dL   Bilirubin, Direct 0.2  0.0 - 0.3 mg/dL   Indirect Bilirubin 0.3  0.3 - 0.9 mg/dL  GLUCOSE, CAPILLARY     Status: Abnormal   Collection Time    09/24/13  7:03 AM      Result Value Ref Range   Glucose-Capillary 130 (*) 70 - 99 mg/dL   Comment 1 Notify RN    GLUCOSE, CAPILLARY     Status: Abnormal   Collection Time    09/24/13 11:15 AM      Result Value Ref Range   Glucose-Capillary 204 (*) 70 - 99 mg/dL  GLUCOSE, CAPILLARY     Status: Abnormal   Collection Time    09/24/13  4:26 PM      Result Value Ref Range   Glucose-Capillary 180 (*) 70 - 99 mg/dL  GLUCOSE, CAPILLARY     Status: Abnormal   Collection Time    09/24/13  8:41 PM      Result Value Ref Range   Glucose-Capillary 185 (*) 70 - 99 mg/dL  CBC     Status: Abnormal   Collection Time    09/25/13  5:02 AM      Result Value Ref Range   WBC 13.0 (*) 4.0 - 10.5 K/uL   RBC 2.57 (*) 4.22 - 5.81 MIL/uL   Hemoglobin 7.8 (*) 13.0 - 17.0 g/dL   HCT 24.4 (*) 39.0 - 52.0 %   MCV 94.9  78.0 - 100.0 fL   MCH 30.4  26.0 - 34.0 pg   MCHC 32.0  30.0 - 36.0 g/dL   RDW 16.6 (*) 11.5 - 15.5 %   Platelets 221  150 - 400 K/uL  GLUCOSE,  CAPILLARY     Status: Abnormal   Collection Time    09/25/13  7:08 AM      Result Value Ref Range   Glucose-Capillary 112 (*) 70 - 99 mg/dL     HEENT: normal Cardio: irregular and no murmurs Resp: CTA B/L and unlabored GI: BS positive and large bilateral inguinal mass, nontender Extremity:  No Edema  Neuro: Confused, , Abnormal Motor 4/5 in BUE, 3 to 3+/5 in BLE, Abnormal FMC Ataxic/ dec FMC, Dysarthric and Apraxic. More alert Musc/Skel:  Normal Gen NAD Skin:  Heels red  Assessment/Plan: 1. Functional deficits secondary to bilateral cerebral infarct embolic which require 3+ hours per day of interdisciplinary therapy in a comprehensive inpatient rehab setting. Physiatrist is providing close team supervision and 24 hour management of active medical problems listed below. Physiatrist and rehab team continue to assess barriers to discharge/monitor patient progress toward functional and medical goals.  FIM: FIM - Bathing Bathing Steps Patient Completed: Chest;Right Arm;Left Arm;Abdomen Bathing: 2: Max-Patient completes 3-4 60f10 parts or 25-49%  FIM - Upper Body Dressing/Undressing Upper body dressing/undressing steps patient completed: Put head through opening of pull over shirt/dress;Thread/unthread right sleeve of pullover shirt/dresss;Thread/unthread left sleeve of pullover shirt/dress;Pull shirt over trunk Upper body dressing/undressing: 5: Supervision: Safety issues/verbal cues FIM - Lower Body Dressing/Undressing Lower body dressing/undressing steps patient  completed: Pull pants up/down (bridging in bed to pull up pants) Lower body dressing/undressing: 1: Total-Patient completed less than 25% of tasks  FIM - Toileting Toileting: 0: Activity did not occur  FIM - Radio producer Devices: Bedside commode;Grab bars Toilet Transfers: 0-Activity did not occur  FIM - Control and instrumentation engineer Devices: Sliding board Bed/Chair  Transfer: 1: Mechanical lift  FIM - Locomotion: Wheelchair Distance: 75 Locomotion: Wheelchair: 1: Total Assistance/staff pushes wheelchair (Pt<25%) FIM - Locomotion: Ambulation Locomotion: Ambulation Assistive Devices: Sara Plus Ambulation/Gait Assistance: 1: +2 Total assist Locomotion: Ambulation: 1: Two helpers  Comprehension Comprehension Mode: Auditory Comprehension: 3-Understands basic 50 - 74% of the time/requires cueing 25 - 50%  of the time  Expression Expression Mode: Verbal Expression: 3-Expresses basic 50 - 74% of the time/requires cueing 25 - 50% of the time. Needs to repeat parts of sentences.  Social Interaction Social Interaction: 2-Interacts appropriately 25 - 49% of time - Needs frequent redirection.  Problem Solving Problem Solving: 2-Solves basic 25 - 49% of the time - needs direction more than half the time to initiate, plan or complete simple activities  Memory Memory: 2-Recognizes or recalls 25 - 49% of the time/requires cueing 51 - 75% of the time  Medical Problem List and Plan:  1. Functional deficits secondary to acute cerebral white matter bilateral infarct  2. DVT Prophylaxis/Anticoagulation:off Eliquis per GI due to liver failure, change to ASA until ok to resume Eliquis. Off lovenox due to bleeding 3. Pain Management: Tylenol as needed  4. Dysphagia with decreased nutritional storage. Dysphagia 1 nectar liquids.   -appetite showing some signs of improvement 5. Neuropsych: This patient is not capable of making decisions on his own behalf.  6. Hypertension/atrial fibrillation. Norvasc 10 mg daily, hydralazine 10 mg 4 times a day, metoprolol 25 mg every 8 hours.   7 ID/methicillin sensitive staph aureus. Continue cefazolin 2 g intravenously every 8 hours x4 weeks total initiated 08/29/2013.   8. Diabetes mellitus with peripheral neuropathy.    -increased Levemir to 25 units each bedtime.   -consider meal time covg when po intake is more consistent 9.  Integumentary: local skin care, turning, maintain foley for now, mattress overlay, WOC, Diflucan for yeast PRAFOs for heels 10.  Inguinal mass, bilateral hernia, obviously chronic and not incarcerated, given recent CVA, no elective surg recommended at this time 11.  Elevated alk phos, off Lipitor,  ? lipitor vs autoimmune with increased IgG and IgA, liver ultrasound OK, appreciate close GI f/u  -AP down to 1100 today 12.  Epistaxis d/c lovenox and apply local measures, afrin,  LOS (Days) 14 A FACE TO FACE EVALUATION WAS PERFORMED  Nykerria Macconnell T 09/25/2013, 8:37 AM

## 2013-09-25 NOTE — Progress Notes (Signed)
Physical Therapy Session Note  Patient Details  Name: Elijah CardJames A Beaumont MRN: [id number] Date of Birth: 08/06/1933  Today's Date: 09/25/2013 Time: [id number] Time Calculation (min): 65 min  Short Term Goals: Week 2:  PT Short Term Goal 1 (Week 2): pt will move supine > sit with min assist PT Short Term Goal 2 (Week 2): pt will transfer with assist of 1 person, > 50% of attempts PT Short Term Goal 3 (Week 2): pt will propel w/c (regular) x 25' with mod assist,  PT Short Term Goal 4 (Week 2): pt will perform gait x 10' with assist of 2 people, 4 out of 5 days  Skilled Therapeutic Interventions/Progress Updates:  Tx focused on functional mobility training, gait with Carley HammedEva walker, therxe and stretching for functional strengthening and joint positioning. Pt asleep in bed, needing max encouragement for participation throughout tx, which was limited by LBP. PT requested that nursing attempt to pre-medicate. Pt had nose bleed at start of tx, needing compression x5 min. Performed supine HS, heel cord, and hip rotator stretching bil during this time. Provided education on importance of engagement in PT and therapies for increased odds of returning home with caregiver support.   Performed bed mobility with elevated HOB and cues for technique with S only x2 attempts with increased time and effort required.  Performed stand-pivot transfer with cues for hand placement and min A for completing turn. Pt needed initiation assist due to decreased motivation.   Gait training with Carley HammedEva walker with +2 for IV pole and WC follow x10' with mod A overall for trunk support and device management. Pt limited by pain and declining further attempts, as well as declining standing.   Therex for increased activity tolerance and strengthening at [facility] and UE bike x 7min each total, with therapeutic rest breaks needed after every minute due to fatigue.    Performed supported sitting ball bat/toss for increased speed and recations  of movements x523min.   Pt requesting to go back to bed, but agreeable to tilt-in-space "for a while," left up with lap belt, puzzle book, and all needs in reach.       Therapy Documentation Precautions:  Precautions Precautions: Fall Precaution Comments: pt resists movement (posterior lean) Restrictions Weight Bearing Restrictions: No Vital Signs: Therapy Vitals Temp: 97.6 F (36.4 C) Temp src: Oral Pulse Rate: 88 Resp: 18 BP: 120/75 mmHg Patient Position (if appropriate): Lying Oxygen Therapy SpO2: 100 % O2 Device: None (Room air)  Pain: 7/10 LBP, modified tx and RN made aware, bringing pain meds.       Locomotion : Ambulation Ambulation/Gait Assistance: 1: +2 Total assist   See FIM for current functional status  Therapy/Group: Individual Therapy Clydene Lamingole Maysen Bonsignore, PT, DPT  09/25/2013, 3:57 PM

## 2013-09-26 ENCOUNTER — Inpatient Hospital Stay (HOSPITAL_COMMUNITY): Payer: Medicare Other

## 2013-09-26 ENCOUNTER — Inpatient Hospital Stay (HOSPITAL_COMMUNITY): Payer: Medicare Other | Admitting: Occupational Therapy

## 2013-09-26 ENCOUNTER — Inpatient Hospital Stay (HOSPITAL_COMMUNITY): Payer: Medicare Other | Admitting: Speech Pathology

## 2013-09-26 DIAGNOSIS — I6340 Cerebral infarction due to embolism of unspecified cerebral artery: Secondary | ICD-10-CM

## 2013-09-26 LAB — GLUCOSE, CAPILLARY
Glucose-Capillary: 137 mg/dL — ABNORMAL HIGH (ref 70–99)
Glucose-Capillary: 147 mg/dL — ABNORMAL HIGH (ref 70–99)
Glucose-Capillary: 131 mg/dL — ABNORMAL HIGH (ref 70–99)
Glucose-Capillary: 162 mg/dL — ABNORMAL HIGH (ref 70–99)

## 2013-09-26 MED ORDER — LIDOCAINE 5 % EX PTCH
2.0000 | MEDICATED_PATCH | CUTANEOUS | Status: DC
  Administered 2013-09-26 – 2013-09-27 (×2): 2 via TRANSDERMAL
  Filled 2013-09-26 (×3): qty 2

## 2013-09-26 NOTE — Progress Notes (Signed)
Social Work [name], male   DOB: 02-05-1934, 80 y.o.   MRN: [id number] Met with pt and spoke with son-Alan regarding pt's team conference progression toward goals and discharge date of 7/21.  Discussed we need to come up with a plan, since pt's wife can not provide assist and both son's work.  HE will talk with his family members.  He also wants to know about if a dentist could fit him here for dentures, since His were lost at [facility].  Directed him back to [facility] and the person he spoke with previously.  Informed dentist is here for emergency's only not denture fitting.  Son to follow up with. Pt is aware his wife can not assist him at discharge.  Will need to come up with plan for discharge, aware of the options of NHP and coverage for this.

## 2013-09-26 NOTE — Patient Care Conference (Signed)
Inpatient RehabilitationTeam Conference and Plan of Care Update Date: 09/26/2013   Time: 9;55 AM    Patient Name: Elijah [name]      Medical Record Number: [id number]  Date of Birth: 11/08/1933 Sex: Male         Room/Bed: 4W03C/4W03C-01 Payor Info: Payor: Advertising copywriterUNITED HEALTHCARE MEDICARE / Plan: AARP MEDICARE COMPLETE / Product Type: *No Product type* /    Admitting Diagnosis: CVA  Admit Date/Time:  09/11/2013  2:27 PM Admission Comments: No comment available   Primary Diagnosis:  <principal problem not specified> Principal Problem: <principal problem not specified>  Patient Active Problem List   Diagnosis Date Noted  . CVA (cerebral infarction) 09/11/2013    Expected Discharge Date: Expected Discharge Date: 10/09/13  Team Members Present: Physician leading conference: Dr. Faith RogueZachary Swartz Social Worker Present: Dossie DerBecky Rheannon Cerney, LCSW Nurse Present: Carlean PurlMaryann Barbour, RN PT Present: Wanda Plumparoline Cook, PT;Blair Hobble, PT OT Present: Bretta BangKris Gellert, OT SLP Present: Jackalyn LombardNicole Page, SLP PPS Coordinator present : Edson SnowballBecky Windsor, PT     Current Status/Progress Goal Weekly Team Focus  Medical   skin care improving, cognitively advanced, having low back pain which is inhibiting therapy  improve nutrition, activity tolerance  skin care, bladder mgt (dc foley)   Bowel/Bladder   Incont bowel and bladder, F/C LBM7/09/2013  To become continent  of bowel and bladder by discharge.  Timed toileting once F/C removed   Swallow/Nutrition/ Hydration   Dys 1 textures and nectar-thick liquids with full staff supervision, Max assist   least restrictive PO intake with Min assist   trials of Dys 2 textures   ADL's   Much improved initiation, attention, problem solving, trunk control and overall strength:  min A squat pivot transfers, cues/ set up UB bathing and dressing, max A LB bathing and dressing, min A to stand at sink for LB dressing  min A overall  ADL retraining, cognitive activites, functional mobility,  pt/family education   Mobility   Min A bed mobility, Min/mod transfers, gait x15' with Huntley DecSara Plus or Fara BorosEva Walker, [facility] LLCWC propulsion x75' with s/min A  S bed mobiltiy, min A transfers transfers, gait x50' min A, WC x150' with S  Standing tolerance, gait training, WC propulsion, transfer training, stretching/strengthening, LBP management   Communication   Mod assist   Min assist   increase recall of taught compensatory strategies as well as self-monitoring    Safety/Cognition/ Behavioral Observations  Max assist   Min assist   continue to increase sustained attenton, problem solving and awareness   Pain   No c/o pain  Pain scale <3   Assess for verbal and non verbal cues of pain.    Skin   Moisture related buttoks breakdown , protective cream applied, Scrotom red and edematous, abdominal hernia.  Stay free of infection and skin breakdown  Assess skin q shift      *See Care Plan and progress notes for long and short-term goals.  Barriers to Discharge: pain, neuro deficits    Possible Resolutions to Barriers:  continue NMR, rx pain,    Discharge Planning/Teaching Needs:  Family has been here and observed and want to take pt home but still trying to figure out a plan.  Aware of NH option and feel it will come to this for the need for longer rehab      Team Discussion:  Making progress-back pain limiting.  Try lidoderm patch for pain relieve.  Skin better.  More alert and more oriented.  Fatigue and inattention still  issues. Trial of DYS 2 with SP only.  Foley may be d/c soon-incont of bowel.  Revisions to Treatment Plan:  Possible NHP, family to discuss plan   Continued Need for Acute Rehabilitation Level of Care: The patient requires daily medical management by a physician with specialized training in physical medicine and rehabilitation for the following conditions: Daily direction of a multidisciplinary physical rehabilitation program to ensure safe treatment while eliciting the highest  outcome that is of practical value to the patient.: Yes Daily medical management of patient stability for increased activity during participation in an intensive rehabilitation regime.: Yes Daily analysis of laboratory values and/or radiology reports with any subsequent need for medication adjustment of medical intervention for : Neurological problems;Other  Elijah [name], Elijah [name] 09/26/2013, 1:56 PM

## 2013-09-26 NOTE — Progress Notes (Signed)
Subjective/Complaints: Uneventful night. Denies pain. Review of Systems - limited secondary to mental status  Objective: Vital Signs: Blood pressure 126/73, pulse 120, temperature 98.2 F (36.8 C), temperature source Oral, resp. rate 18, weight 85.594 kg (188 lb 11.2 oz), SpO2 97.00%. No results found. Results for orders placed during the hospital encounter of 09/11/13 (from the past 72 hour(s))  GLUCOSE, CAPILLARY     Status: Abnormal   Collection Time    09/23/13 11:28 AM      Result Value Ref Range   Glucose-Capillary 158 (*) 70 - 99 mg/dL   Comment 1 Notify RN    GLUCOSE, CAPILLARY     Status: Abnormal   Collection Time    09/23/13  4:45 PM      Result Value Ref Range   Glucose-Capillary 270 (*) 70 - 99 mg/dL   Comment 1 Notify RN    GLUCOSE, CAPILLARY     Status: Abnormal   Collection Time    09/23/13  8:57 PM      Result Value Ref Range   Glucose-Capillary 215 (*) 70 - 99 mg/dL   Comment 1 Notify RN    HEPATIC FUNCTION PANEL     Status: Abnormal   Collection Time    09/24/13  5:03 AM      Result Value Ref Range   Total Protein 7.3  6.0 - 8.3 g/dL   Albumin 1.8 (*) 3.5 - 5.2 g/dL   AST 57 (*) 0 - 37 U/L   ALT 56 (*) 0 - 53 U/L   Alkaline Phosphatase 1161 (*) 39 - 117 U/L   Total Bilirubin 0.5  0.3 - 1.2 mg/dL   Bilirubin, Direct 0.2  0.0 - 0.3 mg/dL   Indirect Bilirubin 0.3  0.3 - 0.9 mg/dL  GLUCOSE, CAPILLARY     Status: Abnormal   Collection Time    09/24/13  7:03 AM      Result Value Ref Range   Glucose-Capillary 130 (*) 70 - 99 mg/dL   Comment 1 Notify RN    GLUCOSE, CAPILLARY     Status: Abnormal   Collection Time    09/24/13 11:15 AM      Result Value Ref Range   Glucose-Capillary 204 (*) 70 - 99 mg/dL  GLUCOSE, CAPILLARY     Status: Abnormal   Collection Time    09/24/13  4:26 PM      Result Value Ref Range   Glucose-Capillary 180 (*) 70 - 99 mg/dL  GLUCOSE, CAPILLARY     Status: Abnormal   Collection Time    09/24/13  8:41 PM      Result Value  Ref Range   Glucose-Capillary 185 (*) 70 - 99 mg/dL  CBC     Status: Abnormal   Collection Time    09/25/13  5:02 AM      Result Value Ref Range   WBC 13.0 (*) 4.0 - 10.5 K/uL   RBC 2.57 (*) 4.22 - 5.81 MIL/uL   Hemoglobin 7.8 (*) 13.0 - 17.0 g/dL   HCT 24.4 (*) 39.0 - 52.0 %   MCV 94.9  78.0 - 100.0 fL   MCH 30.4  26.0 - 34.0 pg   MCHC 32.0  30.0 - 36.0 g/dL   RDW 16.6 (*) 11.5 - 15.5 %   Platelets 221  150 - 400 K/uL  GLUCOSE, CAPILLARY     Status: Abnormal   Collection Time    09/25/13  7:08 AM      Result  Value Ref Range   Glucose-Capillary 112 (*) 70 - 99 mg/dL  GLUCOSE, CAPILLARY     Status: Abnormal   Collection Time    09/25/13 11:16 AM      Result Value Ref Range   Glucose-Capillary 196 (*) 70 - 99 mg/dL  GLUCOSE, CAPILLARY     Status: Abnormal   Collection Time    09/25/13  4:34 PM      Result Value Ref Range   Glucose-Capillary 155 (*) 70 - 99 mg/dL  GLUCOSE, CAPILLARY     Status: Abnormal   Collection Time    09/25/13  8:50 PM      Result Value Ref Range   Glucose-Capillary 169 (*) 70 - 99 mg/dL   Comment 1 Notify RN    GLUCOSE, CAPILLARY     Status: Abnormal   Collection Time    09/26/13  7:23 AM      Result Value Ref Range   Glucose-Capillary 137 (*) 70 - 99 mg/dL   Comment 1 Notify RN       HEENT: normal Cardio: irregular and no murmurs Resp: CTA B/L and unlabored GI: BS positive and large bilateral inguinal mass, nontender Extremity:  No Edema  Neuro: Confused, , Abnormal Motor 4/5 in BUE, 3 to 3+/5 in BLE, Abnormal FMC Ataxic/ dec FMC, Dysarthric and Apraxic. More alert Musc/Skel:  Normal Gen NAD Skin:  Heels red  Assessment/Plan: 1. Functional deficits secondary to bilateral cerebral infarct embolic which require 3+ hours per day of interdisciplinary therapy in a comprehensive inpatient rehab setting. Physiatrist is providing close team supervision and 24 hour management of active medical problems listed below. Physiatrist and rehab team  continue to assess barriers to discharge/monitor patient progress toward functional and medical goals.  FIM: FIM - Bathing Bathing Steps Patient Completed: Chest;Right Arm;Left Arm;Abdomen;Right upper leg;Left upper leg Bathing: 3: Mod-Patient completes 5-7 75f10 parts or 50-74%  FIM - Upper Body Dressing/Undressing Upper body dressing/undressing steps patient completed: Put head through opening of pull over shirt/dress;Thread/unthread right sleeve of pullover shirt/dresss;Thread/unthread left sleeve of pullover shirt/dress;Pull shirt over trunk Upper body dressing/undressing: 5: Supervision: Safety issues/verbal cues FIM - Lower Body Dressing/Undressing Lower body dressing/undressing steps patient completed: Thread/unthread left pants leg (pulled pants up 80% of the way in standing) Lower body dressing/undressing: 2: Max-Patient completed 25-49% of tasks  FIM - Toileting Toileting: 0: Activity did not occur  FIM - TRadio producerDevices: Bedside commode;Grab bars Toilet Transfers: 0-Activity did not occur  FIM - BControl and instrumentation engineerDevices: Arm rests;HOB elevated;Bed rails Bed/Chair Transfer: 5: Supine > Sit: Supervision (verbal cues/safety issues);4: Bed > Chair or W/C: Min A (steadying Pt. > 75%)  FIM - Locomotion: Wheelchair Distance: 75 Locomotion: Wheelchair: 1: Total Assistance/staff pushes wheelchair (Pt<25%) FIM - Locomotion: Ambulation Locomotion: Ambulation Assistive Devices: WEthelene HalAmbulation/Gait Assistance: 1: +2 Total assist Locomotion: Ambulation: 1: Two helpers  Comprehension Comprehension Mode: Auditory Comprehension: 4-Understands basic 75 - 89% of the time/requires cueing 10 - 24% of the time  Expression Expression Mode: Verbal Expression: 3-Expresses basic 50 - 74% of the time/requires cueing 25 - 50% of the time. Needs to repeat parts of sentences.  Social Interaction Social Interaction:  2-Interacts appropriately 25 - 49% of time - Needs frequent redirection.  Problem Solving Problem Solving: 2-Solves basic 25 - 49% of the time - needs direction more than half the time to initiate, plan or complete simple activities  Memory Memory: 2-Recognizes or recalls 25 -  49% of the time/requires cueing 51 - 75% of the time  Medical Problem List and Plan:  1. Functional deficits secondary to acute cerebral white matter bilateral infarct  2. DVT Prophylaxis/Anticoagulation:off Eliquis per GI due to liver failure, change to ASA until ok to resume Eliquis. Off lovenox due to bleeding 3. Pain Management: Tylenol as needed  4. Dysphagia with decreased nutritional storage. Dysphagia 1 nectar liquids.   -appetite showing some signs of improvement 5. Neuropsych: This patient is not capable of making decisions on his own behalf.  6. Hypertension/atrial fibrillation. Norvasc 10 mg daily, hydralazine 10 mg 4 times a day, metoprolol 25 mg every 8 hours.   7 ID/methicillin sensitive staph aureus. Continue cefazolin 2 g intravenously every 8 hours x4 weeks total initiated 08/29/2013.   8. Diabetes mellitus with peripheral neuropathy.  (some improvement noted)  -increased Levemir to 25 units each bedtime.   -consider meal time covg when po intake is more consistent 9. Integumentary: local skin care, turning, maintain foley for now, mattress overlay, WOC, Diflucan for yeast PRAFOs for heels 10.  Inguinal mass, bilateral hernia, obviously chronic and not incarcerated, given recent CVA, no elective surg recommended at this time 11.  Elevated alk phos, off Lipitor,  ? lipitor vs autoimmune with increased IgG and IgA, liver ultrasound OK, appreciate close GI f/u  -AP trending down 12.  Epistaxis d/c'ed  lovenox , afrin,  LOS (Days) 15 A FACE TO FACE EVALUATION WAS PERFORMED  Tylek Boney T 09/26/2013, 8:49 AM

## 2013-09-26 NOTE — Progress Notes (Signed)
Occupational Therapy Session Note  Patient Details  Name: Elijah [name] MRN: [id number] Date of Birth: 15-Jun-1933  Today's Date: 09/26/2013 Time: 0730-0830 Time Calculation (min): 60 min  Short Term Goals: Week 1:  OT Short Term Goal 1 (Week 1): Pt will be able to sit EOB with mod A x 1 for 10 min to engage in UB bathing and dressing. OT Short Term Goal 1 - Progress (Week 1): Met OT Short Term Goal 2 (Week 1): Pt will be able to bathe his UB with min A. OT Short Term Goal 2 - Progress (Week 1): Met OT Short Term Goal 3 (Week 1): Pt will be able to don a tshirt with mod A. OT Short Term Goal 3 - Progress (Week 1): Met OT Short Term Goal 4 (Week 1): Pt will be able to transfer to w/c from bed with max A x1. OT Short Term Goal 4 - Progress (Week 1): Progressing toward goal OT Short Term Goal 5 (Week 1): Pt will demonstrate improved direction following during simple grooming with min cues. OT Short Term Goal 5 - Progress (Week 1): Met Week 2:  OT Short Term Goal 1 (Week 2): Groom:  complete 3 groom tasks while seated in w/c at sink OT Short Term Goal 2 (Week 2): Bath:  8/10 tasks sitting EOB with setup/cues OT Short Term Goal 3 (Week 2): LB Dressing:  Patient will be close supervision to donn feet into pants while sitting EOB. OT Short Term Goal 4 (Week 2): Bed Mobility:  Min assist for side lying to sit without bed rail and with HOB down to prepare for BADL at EOB.  Skilled Therapeutic Interventions/Progress Updates:    Pt seen for BADL retraining of B/D with a focus on attention, initiation, problem solving, strengthening, trunk control, and transitional movement skills. Pt was sleeping in bed and needed a great deal of encouragement/ prompting to cooperate with getting out of bed as he stated his back was in a great deal of pain. Discussed his planned discharge date in 13 days and the need for him to work hard on a daily basis.  Nursing notified for pain medication.  Pt needed more A to sit  up in bed and to squat pivot to w/c (mod A) as he was very distracted by his pain. Pt sitting in chair constantly scooting forward due to back discomfort, used distraction techniques to keep pt focused.  Pt sat at sink to wash UB and part of LB. He donned shirt but really struggled to don shorts over feet from back pain.  Pt did come into a stand 1x with min a to pull pants over hips with 50% help. He only tolerated standing for less than 10 seconds and declined to stand again. Pt's back pain seems to be muscular in nature. In chair provided pt with a few minutes of massage and then place kpad behind pt's back. Pt demonstrated that he was knowledgeable and able to press the nurse call button.  His nurse stated that the pt did use the call light 2x yesterday to contact him. Pt had QRB on. His NT arrived to assist him with breakfast.    Therapy Documentation Precautions:  Precautions Precautions: Fall Precaution Comments: pt resists movement (posterior lean) Restrictions Weight Bearing Restrictions: No    Vital Signs: Therapy Vitals Pulse Rate: 120 BP: 126/73 mmHg Patient Position (if appropriate): Standing Pain: Pain Assessment Pain Assessment: 0-10 Pain Score: 6  Pain Location: Back Pain Orientation: Lower  Pain Descriptors / Indicators: Aching Pain Intervention(s): Medication (See eMAR)  See FIM for current functional status  Therapy/Group: Individual Therapy  [place] 09/26/2013, 9:01 AM

## 2013-09-26 NOTE — Progress Notes (Signed)
Speech Language Pathology Daily Session Note  Patient Details  Name: Elijah [name] MRN: [id number] Date of Birth: 10/07/1933  Today's Date: 09/26/2013 Time: 1101-1201 Time Calculation (min): 60 min  Short Term Goals: Week 2: SLP Short Term Goal 1 (Week 2): Pt will utilize safe swallow strategies while consuming Dys 1 textures and nectar-thick liquids with Min cues SLP Short Term Goal 2 (Week 2): Pt will utilize speech intelligibility strategies at the phrase level with Min cues SLP Short Term Goal 3 (Week 2): Pt will sustain attention to basic familiar task for 10 minutes with Mod cues SLP Short Term Goal 4 (Week 2): Pt will verbalize at least 1cognitive and 1 physical impairment with Mod cues SLP Short Term Goal 5 (Week 2): Pt will demonstrate basic problem solving during basic, familiar task with Min cues SLP Short Term Goal 6 (Week 2): Pt will utilize external memory aids to recall new and daily information with Mod cues  Skilled Therapeutic Interventions: Skilled treatment session focused on addressing dysphagia and cognitive goals. Patient received up in wheelchair.  Patient required Mod multimodal cues for arousal and orientation with use of external aids.  Patient reported pain, RN made aware and medication received.  Patient required Mod cues for sustained attention to self-feeding task for ~5 minutes at a time with intermittent rest breaks for chair tips due to pain  Patient consumed Dys 1 and 2 textures with Mod cues to attend to bolus and manage oral residue post swallow.  Patient demonstrated no overt s/s of aspiration with nectar-thick liquid cup sips.  Recommend Dys 2 trial tray tomorrow with therapy.    FIM:  Comprehension Comprehension Mode: Auditory Comprehension: 4-Understands basic 75 - 89% of the time/requires cueing 10 - 24% of the time Expression Expression Mode: Verbal Expression: 3-Expresses basic 50 - 74% of the time/requires cueing 25 - 50% of the time. Needs to  repeat parts of sentences. Social Interaction Social Interaction: 3-Interacts appropriately 50 - 74% of the time - May be physically or verbally inappropriate. Problem Solving Problem Solving: 3-Solves basic 50 - 74% of the time/requires cueing 25 - 49% of the time Memory Memory: 3-Recognizes or recalls 50 - 74% of the time/requires cueing 25 - 49% of the time FIM - Eating Eating Activity: 5: Set-up assist for open containers;5: Set-up assist for cut food;5: Needs verbal cues/supervision;4: Helper checks for pocketed food  Pain Pain Assessment Pain Assessment: 0-10 Pain Score: 4  Faces Pain Scale: Hurts even more Pain Location: Back Pain Orientation: Left Pain Intervention(s): RN made aware  Therapy/Group: Individual Therapy  Charlane FerrettiMelissa Annalicia Renfrew, M.A., CCC-SLP [phone]  Gorge Almanza 09/26/2013, 12:34 PM

## 2013-09-26 NOTE — Progress Notes (Addendum)
Physical Therapy Weekly Progress Note  Patient Details  Name: Elijah [name] MRN: [id number] Date of Birth: 1933/09/27  Beginning of progress report period: September 20, 2013 End of progress report period: September 26, 2013  Today's Date: 09/26/2013 Time: 2376-2831; 5176-1607 Time Calculation (min): 45 min, 30 min  Patient has met 2 of 4 short term goals.  Pain in low back has limited his bed mobility and participation ambulating. MD is aware and will address with meds.  Patient continues to demonstrate the following deficits: strength, activity tolerance, flexibility, pain, functional mobility, locomotion and therefore will continue to benefit from skilled PT intervention to enhance overall performance with activity tolerance, balance, postural control, ability to compensate for deficits, functional use of  right upper extremity, right lower extremity, left upper extremity and left lower extremity and awareness.  Patient progressing toward long term goals., limited by pain.  Continue plan of care.  PT Short Term Goals Week 2:  PT Short Term Goal 1 (Week 2): pt will move supine > sit with min assist PT Short Term Goal 1 - Progress (Week 2): Progressing toward goal PT Short Term Goal 2 (Week 2): pt will transfer with assist of 1 person, > 50% of attempts PT Short Term Goal 2 - Progress (Week 2): Met PT Short Term Goal 3 (Week 2): pt will propel w/c (regular) x 25' with mod assist,  PT Short Term Goal 3 - Progress (Week 2): Met PT Short Term Goal 4 (Week 2): pt will perform gait x 10' with assist of 2 people, 4 out of 5 days PT Short Term Goal 4 - Progress (Week 2): Partly met     Skilled Therapeutic Interventions/Progress Updates:  Tx 1:  Pt up in tilt in space, oriented x 4, asking to return to bed but eventually motivated to try standing.  Sit> stand x 2 with +2 assist, pt performing 50% of effort.  Attempted gait x 2 with grocery cart; pt held onto cart and took 1 step, but stated low back  pain was too much; pt apologized repeatedly for not being able to ambulate.  W/c> mat with mod assist squat pivot, unsafely sitting on wheel; sit> R sidelying with min guard assist.  Pt positioned comfortably in R sidelying and heat applied to L low back, over iliac crest.  Pt fell asleep and was snoring immediately. R sidelying> sit with mod assist for LLE and trunk elevation, due to pain.  Stand pivot to L mat> w/c with mod / max assist.  Returned to NT with quick release belt in place.  Tx 2:  Bed mobility in flat bed with mod assist supine> sit.  Stand pivot to R bed> regular w/c, 2 attempts, mod assist focusing on safely pivoting enough to clear L hip.  W/c propulsion using bil UEs x 100' with supervision due to veering L, but self correcting 50% of time.  Pt left at nurse's station for observation, looking at magazine with quick release belt in place.    Therapy Documentation Precautions:  Precautions Precautions: Fall Precaution Comments:  pain low back limits volitional movement Restrictions Weight Bearing Restrictions: No   Pain: Faces Pain Scale: Hurts even more Pain Location: Back Pain Orientation: Left Pain Intervention(s): RN made aware; heat applied, positioned in R sidelying      See FIM for current functional status  Therapy/Group: Individual Therapy  Elijah [name] 09/26/2013, 12:17 PM

## 2013-09-26 NOTE — Progress Notes (Signed)
Intravenous Ancef to be completed tomorrow 09/27/2013 for methicillin sensitive staph aureus. Patient remains afebrile.

## 2013-09-26 NOTE — Consult Note (Addendum)
WOC wound follow-up consult note Buttocks with moisture-associated skin damage which has improved since previous assessment.  Pt is frequently incontinent of stool and difficult to keep wounds from becoming soiled. Foley is helping promote healing by controlling urinary incontinence. All preventive measures have been in place; air mattress replacement is on bed to reduce pressure, barrier cream to protect skin and repel moisture, repositioned frequently.  Pt continues to have mod amt green drainage from upper buttocks sitesX2 and wound culture indicates pseudomonas. Upper buttocks remain with100% yellow slough; .2X2X.1cm and .2X3.5X.1cm. Pink wound edges and bleed slightly when cleansed. Bilat heels with no further wounds; previously noted areas have healed with intact skin. Plan: Continue present plan of care with Santyl for chemical debridement of nonviable tissue to upper buttocks wounds and leave foley if possible to protect areas and promote healing.  Please re-consult if further assistance is needed.  Thank-you,  Cammie Mcgeeawn Oyinkansola Truax MSN, RN, CWOCN, GenevaWCN-AP, CNS [phone]

## 2013-09-27 ENCOUNTER — Inpatient Hospital Stay (HOSPITAL_COMMUNITY): Payer: Medicare Other

## 2013-09-27 ENCOUNTER — Inpatient Hospital Stay (HOSPITAL_COMMUNITY): Payer: Medicare Other | Admitting: Occupational Therapy

## 2013-09-27 ENCOUNTER — Ambulatory Visit (HOSPITAL_COMMUNITY): Payer: Medicare Other | Admitting: Speech Pathology

## 2013-09-27 LAB — GLUCOSE, CAPILLARY
Glucose-Capillary: 103 mg/dL — ABNORMAL HIGH (ref 70–99)
Glucose-Capillary: 156 mg/dL — ABNORMAL HIGH (ref 70–99)
Glucose-Capillary: 126 mg/dL — ABNORMAL HIGH (ref 70–99)
Glucose-Capillary: 165 mg/dL — ABNORMAL HIGH (ref 70–99)

## 2013-09-27 NOTE — Progress Notes (Addendum)
Subjective/Complaints: No new issues. Denies pain.  Review of Systems - limited secondary to mental status  Objective: Vital Signs: Blood pressure 121/80, pulse 85, temperature 98.8 F (37.1 C), temperature source Oral, resp. rate 18, weight 85.594 kg (188 lb 11.2 oz), SpO2 99.00%. No results found. Results for orders placed during the hospital encounter of 09/11/13 (from the past 72 hour(s))  GLUCOSE, CAPILLARY     Status: Abnormal   Collection Time    09/24/13 11:15 AM      Result Value Ref Range   Glucose-Capillary 204 (*) 70 - 99 mg/dL  GLUCOSE, CAPILLARY     Status: Abnormal   Collection Time    09/24/13  4:26 PM      Result Value Ref Range   Glucose-Capillary 180 (*) 70 - 99 mg/dL  GLUCOSE, CAPILLARY     Status: Abnormal   Collection Time    09/24/13  8:41 PM      Result Value Ref Range   Glucose-Capillary 185 (*) 70 - 99 mg/dL  CBC     Status: Abnormal   Collection Time    09/25/13  5:02 AM      Result Value Ref Range   WBC 13.0 (*) 4.0 - 10.5 K/uL   RBC 2.57 (*) 4.22 - 5.81 MIL/uL   Hemoglobin 7.8 (*) 13.0 - 17.0 g/dL   HCT 24.4 (*) 39.0 - 52.0 %   MCV 94.9  78.0 - 100.0 fL   MCH 30.4  26.0 - 34.0 pg   MCHC 32.0  30.0 - 36.0 g/dL   RDW 16.6 (*) 11.5 - 15.5 %   Platelets 221  150 - 400 K/uL  GLUCOSE, CAPILLARY     Status: Abnormal   Collection Time    09/25/13  7:08 AM      Result Value Ref Range   Glucose-Capillary 112 (*) 70 - 99 mg/dL  GLUCOSE, CAPILLARY     Status: Abnormal   Collection Time    09/25/13 11:16 AM      Result Value Ref Range   Glucose-Capillary 196 (*) 70 - 99 mg/dL  GLUCOSE, CAPILLARY     Status: Abnormal   Collection Time    09/25/13  4:34 PM      Result Value Ref Range   Glucose-Capillary 155 (*) 70 - 99 mg/dL  GLUCOSE, CAPILLARY     Status: Abnormal   Collection Time    09/25/13  8:50 PM      Result Value Ref Range   Glucose-Capillary 169 (*) 70 - 99 mg/dL   Comment 1 Notify RN    GLUCOSE, CAPILLARY     Status: Abnormal   Collection Time    09/26/13  7:23 AM      Result Value Ref Range   Glucose-Capillary 137 (*) 70 - 99 mg/dL   Comment 1 Notify RN    GLUCOSE, CAPILLARY     Status: Abnormal   Collection Time    09/26/13 11:15 AM      Result Value Ref Range   Glucose-Capillary 147 (*) 70 - 99 mg/dL   Comment 1 Notify RN    GLUCOSE, CAPILLARY     Status: Abnormal   Collection Time    09/26/13  4:47 PM      Result Value Ref Range   Glucose-Capillary 131 (*) 70 - 99 mg/dL   Comment 1 Notify RN    GLUCOSE, CAPILLARY     Status: Abnormal   Collection Time    09/26/13  8:38  PM      Result Value Ref Range   Glucose-Capillary 162 (*) 70 - 99 mg/dL   Comment 1 Notify RN    GLUCOSE, CAPILLARY     Status: Abnormal   Collection Time    09/27/13  7:10 AM      Result Value Ref Range   Glucose-Capillary 103 (*) 70 - 99 mg/dL   Comment 1 Notify RN       HEENT: normal Cardio: irregular and no murmurs Resp: CTA B/L and unlabored GI: BS positive and large bilateral inguinal mass, nontender Extremity:  No Edema  Neuro: Confused, , Abnormal Motor 4/5 in BUE, 3 to 3+/5 in BLE, Abnormal FMC Ataxic/ dec FMC, Dysarthric and Apraxic. More alert Musc/Skel:  Normal Gen NAD Skin:  Heels red  Assessment/Plan: 1. Functional deficits secondary to bilateral cerebral infarct embolic which require 3+ hours per day of interdisciplinary therapy in a comprehensive inpatient rehab setting. Physiatrist is providing close team supervision and 24 hour management of active medical problems listed below. Physiatrist and rehab team continue to assess barriers to discharge/monitor patient progress toward functional and medical goals.  FIM: FIM - Bathing Bathing Steps Patient Completed: Chest;Right Arm;Left Arm;Abdomen;Right upper leg;Left upper leg Bathing: 3: Mod-Patient completes 5-7 55f10 parts or 50-74%  FIM - Upper Body Dressing/Undressing Upper body dressing/undressing steps patient completed: Put head through opening of  pull over shirt/dress;Thread/unthread right sleeve of pullover shirt/dresss;Thread/unthread left sleeve of pullover shirt/dress;Pull shirt over trunk Upper body dressing/undressing: 5: Supervision: Safety issues/verbal cues FIM - Lower Body Dressing/Undressing Lower body dressing/undressing steps patient completed: Thread/unthread left pants leg Lower body dressing/undressing: 2: Max-Patient completed 25-49% of tasks  FIM - Toileting Toileting: 0: Activity did not occur  FIM - TRadio producerDevices: Bedside commode;Grab bars Toilet Transfers: 0-Activity did not occur  FIM - BControl and instrumentation engineerDevices: Arm rests;HOB elevated;Bed rails Bed/Chair Transfer: 2: Bed > Chair or W/C: Max A (lift and lower assist);3: Chair or W/C > Bed: Mod A (lift or lower assist)  FIM - Locomotion: Wheelchair Distance: 100 Locomotion: Wheelchair: 5: Travels 50 - 149 ft, turns around, maneuvers to table, bed or toilet, negotiates 3% grade: modified independent FIM - Locomotion: Ambulation Locomotion: Ambulation Assistive Devices: WEthelene HalAmbulation/Gait Assistance: 1: +2 Total assist Locomotion: Ambulation: 0: Activity did not occur  Comprehension Comprehension Mode: Auditory Comprehension: 4-Understands basic 75 - 89% of the time/requires cueing 10 - 24% of the time  Expression Expression Mode: Verbal Expression: 3-Expresses basic 50 - 74% of the time/requires cueing 25 - 50% of the time. Needs to repeat parts of sentences.  Social Interaction Social Interaction: 3-Interacts appropriately 50 - 74% of the time - May be physically or verbally inappropriate.  Problem Solving Problem Solving: 3-Solves basic 50 - 74% of the time/requires cueing 25 - 49% of the time  Memory Memory: 3-Recognizes or recalls 50 - 74% of the time/requires cueing 25 - 49% of the time  Medical Problem List and Plan:  1. Functional deficits secondary to acute  cerebral white matter bilateral infarct  2. DVT Prophylaxis/Anticoagulation:off Eliquis per GI due to liver failure, change to ASA until ok to resume Eliquis. Off lovenox due to bleeding 3. Pain Management: Tylenol as needed  4. Dysphagia with decreased nutritional storage. Dysphagia 1 nectar liquids.   -appetite showing some signs of improvement 5. Neuropsych: This patient is not capable of making decisions on his own behalf.  6. Hypertension/atrial fibrillation. Norvasc 10 mg  daily, hydralazine 10 mg 4 times a day, metoprolol 25 mg every 8 hours.   7 ID/methicillin sensitive staph aureus. Continue cefazolin 2 g intravenously every 8 hours x4 weeks total initiated 08/29/2013.   8. Diabetes mellitus with peripheral neuropathy.  (some improvement noted)  -increased Levemir to 25 units each bedtime.   -sugars leveling out 9. Integumentary: local skin care, turning, maintain foley for now, mattress overlay, WOC, Diflucan for yeast PRAFOs for heels 10.  Inguinal mass, bilateral hernia, obviously chronic and not incarcerated, given recent CVA, no elective surg recommended at this time 11.  Elevated alk phos: off Lipitor,  ? lipitor vs autoimmune with increased IgG and IgA, liver ultrasound OK, appreciate close GI f/u  -AP trending down  -recheck CMET tomorrow 12.  Epistaxis d/c'ed  lovenox , afrin,  LOS (Days) 16 A FACE TO FACE EVALUATION WAS PERFORMED  SWARTZ,ZACHARY T 09/27/2013, 9:06 AM

## 2013-09-27 NOTE — Progress Notes (Signed)
Speech Language Pathology Weekly Progress Note  Patient Details  Name: Elijah [name] MRN: [id number] Date of Birth: [birth year]  Beginning of progress report period: September 19, 2013 End of progress report period: September 27, 2013  Today's Date: 09/27/2013  Short Term Goals: Week 2: SLP Short Term Goal 1 (Week 2): Pt will utilize safe swallow strategies while consuming Dys 1 textures and nectar-thick liquids with Min cues SLP Short Term Goal 1 - Progress (Week 2): Met SLP Short Term Goal 2 (Week 2): Pt will utilize speech intelligibility strategies at the phrase level with Min cues SLP Short Term Goal 2 - Progress (Week 2): Progressing toward goal SLP Short Term Goal 3 (Week 2): Pt will sustain attention to basic familiar task for 10 minutes with Mod cues SLP Short Term Goal 3 - Progress (Week 2): Met SLP Short Term Goal 4 (Week 2): Pt will verbalize at least 1cognitive and 1 physical impairment with Mod cues SLP Short Term Goal 4 - Progress (Week 2): Met SLP Short Term Goal 5 (Week 2): Pt will demonstrate basic problem solving during basic, familiar task with Min cues SLP Short Term Goal 5 - Progress (Week 2): Progressing toward goal SLP Short Term Goal 6 (Week 2): Pt will utilize external memory aids to recall new and daily information with Mod cues SLP Short Term Goal 6 - Progress (Week 2): Progressing toward goal    New Short Term Goals: Week 3: SLP Short Term Goal 1 (Week 3): Pt will demonstrate effectient mastication of Dys 2 textures with Min verbal cues SLP Short Term Goal 2 (Week 3): Pt will utilize speech intelligibility strategies at the phrase level with Min cues SLP Short Term Goal 3 (Week 3): Pt will consistently sustain attention to basic familiar task for 10 minutes with Mod cues SLP Short Term Goal 4 (Week 3): Pt will request help as needed with Mod question cues  SLP Short Term Goal 5 (Week 3): Pt will demonstrate basic problem solving during basic, familiar task with Min  cues SLP Short Term Goal 6 (Week 3): Pt will utilize external memory aids to recall new and daily information with Mod cues  Weekly Progress Updates: Patient has made functional gains and has met 3 out of 6 short term goals this reporting period due to diet toleration, improved sustained attention and intellectual awareness.  Patient remains inconsistent with ability to sustain attention, recall daily information and solve basic problem related to self-care.  Currently, patient continues to require Min-Mod assist for ability to safely complete any basic self-care task.  Pain continues to be a limiting factor in his ability to participate.  Patient would benefit from family being present for therapy.  Patient would benefit from continued skilled SLP intervention to maximize cognitive-linguistic abilities as well as to address least restrictive PO intake in order to maximize his functional independence prior to discharge with 24/7 assist.   Intensity: Minumum of 1-2 x/day, 30 to 90 minutes Frequency: 5 out of 7 days Frequency due to:   Duration/Length of Stay: 7/21 Treatment/Interventions: Cognitive remediation/compensation;Cueing hierarchy;Dysphagia/aspiration precaution training;Environmental controls;Functional tasks;Internal/external aids;Oral motor exercises;Patient/family education;Speech/Language facilitation   Carmelia Roller., CCC-SLP [phone]   [place] 09/27/2013, 8:20 AM

## 2013-09-27 NOTE — Progress Notes (Signed)
Occupational Therapy Weekly Progress Note  Patient Details  Name: Elijah [name] MRN: [id number] Date of Birth: 14-Jan-1934  Beginning of progress report period: September 19, 2013 End of progress report period: September 27, 2013  Today's Date: 09/27/2013 Time: 9735-3299 Time Calculation (min): 54 min  Patient has met 3 of 4 short term goals.  Pt has not been able to don pants over feet from sitting due to back pain. Will try a reacher for LB dressing.  Patient continues to demonstrate the following deficits:  Generalized weakness, apraxia, limited trunk movement and standing tolerance due to back pain, decreased dynamic balance  and therefore will continue to benefit from skilled OT intervention to enhance overall performance with BADL.  Patient progressing toward long term goals..  Continue plan of care.  OT Short Term Goals Week 1:  OT Short Term Goal 1 (Week 1): Pt will be able to sit EOB with mod A x 1 for 10 min to engage in UB bathing and dressing. OT Short Term Goal 1 - Progress (Week 1): Met OT Short Term Goal 2 (Week 1): Pt will be able to bathe his UB with min A. OT Short Term Goal 2 - Progress (Week 1): Met OT Short Term Goal 3 (Week 1): Pt will be able to don a tshirt with mod A. OT Short Term Goal 3 - Progress (Week 1): Met OT Short Term Goal 4 (Week 1): Pt will be able to transfer to w/c from bed with max A x1. OT Short Term Goal 4 - Progress (Week 1): Progressing toward goal OT Short Term Goal 5 (Week 1): Pt will demonstrate improved direction following during simple grooming with min cues. OT Short Term Goal 5 - Progress (Week 1): Met Week 2:  OT Short Term Goal 1 (Week 2): Groom:  complete 3 groom tasks while seated in w/c at sink OT Short Term Goal 1 - Progress (Week 2): Met OT Short Term Goal 2 (Week 2): Bath:  8/10 tasks sitting EOB with setup/cues OT Short Term Goal 2 - Progress (Week 2): Met OT Short Term Goal 3 (Week 2): LB Dressing:  Patient will be close supervision to  donn feet into pants while sitting EOB. OT Short Term Goal 3 - Progress (Week 2): Progressing toward goal OT Short Term Goal 4 (Week 2): Bed Mobility:  Min assist for side lying to sit without bed rail and with HOB down to prepare for BADL at EOB. OT Short Term Goal 4 - Progress (Week 2): Met Week 3:  OT Short Term Goal 1 (Week 3): Pt will transfer on and off toilet with min A using grab bars and squat or stand pivot transfer. OT Short Term Goal 2 (Week 3): Pt will stand up from w/c with steady A only to manage clothing over hips. OT Short Term Goal 3 (Week 3): Pt will be able to stand to wash buttocks and perineal area with steady A. OT Short Term Goal 4 (Week 3): Pt will be able to don pants over feet using a reacher with min A.  Skilled Therapeutic Interventions/Progress Updates:  Pt seen this session for BADL retraining of B/D at sink level and toilet transfers with a focus on sit to stand, dynamic reaching, and attention to task. Pt was sitting in w/c actively working on word find puzzles. Pt was agreeable to bathing and dressing and did attend well to tasks demonstrating increased initiation and problem solving.  Pt continues to have difficulty reaching toward  his feet to don pants over his feet, but he has made great progress with doffing and donning pants over hips in standing. His standing tolerance is still very limited to less than 10 seconds due to back pain and fatigue.  Pt wanted to go back to bed, but was agreeable to practice toilet transfers even though he did not need to toilet. Pt was able to use grab bars and complete a squat pivot to and from the toilet with mod A.  Pt then transferred back to the bed with mod A.  Pt adjusted in bed with call light.  Pt was able to identify nurses button on the call light.    Continue OT 1-2x a day for 60-90 min 5-7 days a week with Balance/vestibular training;Cognitive remediation/compensation;Discharge planning;DME/adaptive equipment  instruction;Functional mobility training;Neuromuscular re-education;Patient/family education;Self Care/advanced ADL retraining;Therapeutic Activities;Therapeutic Exercise;UE/LE Strength taining/ROM;Visual/perceptual remediation/compensation;UE/LE Coordination activities to maximize his independence with ADLs.   Therapy Documentation Precautions:  Precautions Precautions: Fall Precaution Comments:  pain low back limits volitional movement Restrictions Weight Bearing Restrictions: No General: General Amount of Missed OT Time (min): 6 Minutes - pt missed last 6 min due to back pain and fatigue    Pain: 8/10 pain in low back. Reported to nursing. Nurse provided pain medication.    See FIM for current functional status  Therapy/Group: Individual Therapy  Angla Delahunt 09/27/2013, 10:02 AM

## 2013-09-27 NOTE — Progress Notes (Signed)
Speech Language Pathology Daily Session Note  Patient Details  Name: Stormy CardJames A Yankovich MRN: [id number] Date of Birth: 10/31/1933  Today's Date: 09/27/2013 Time: 0800-0900 Time Calculation (min): 60 min  Short Term Goals: Week 3: SLP Short Term Goal 1 (Week 3): Pt will demonstrate effectient mastication of Dys 2 textures with Min verbal cues SLP Short Term Goal 2 (Week 3): Pt will utilize speech intelligibility strategies at the phrase level with Min cues SLP Short Term Goal 3 (Week 3): Pt will consistently sustain attention to basic familiar task for 10 minutes with Mod cues SLP Short Term Goal 4 (Week 3): Pt will request help as needed with Mod question cues  SLP Short Term Goal 5 (Week 3): Pt will demonstrate basic problem solving during basic, familiar task with Min cues SLP Short Term Goal 6 (Week 3): Pt will utilize external memory aids to recall new and daily information with Mod cues  Skilled Therapeutic Interventions:  Pt was seen for skilled speech therapy targeting dysphagia management and basic problem solving during structured tasks.  SLP completed skilled observations with presentations of pt's currently prescribed diet with pt exhibiting no overt s/s of aspiration with purees or nectar thick liquids and effective oral manipulation and clearance of pureed consistencies.  Pt benefited from initial supervision verbal cues for use of swallowing precautions before beginning meal with pt able to self manage precautions for the duration of his meal.  Upon completion of his meal, SLP facilitated session with min assist verbal cues for thought organization, error awareness, and working memory during a semi-complex board game targeting problem solving.  Pt benefited from intermittent reminders for turn taking  and min cuing for redirectionin the setting of moderate environmental distractions.     FIM:  Comprehension Comprehension Mode: Auditory Comprehension: 4-Understands basic 75 - 89% of  the time/requires cueing 10 - 24% of the time Expression Expression Mode: Verbal Expression: 4-Expresses basic 75 - 89% of the time/requires cueing 10 - 24% of the time. Needs helper to occlude trach/needs to repeat words. Social Interaction Social Interaction: 4-Interacts appropriately 75 - 89% of the time - Needs redirection for appropriate language or to initiate interaction. Problem Solving Problem Solving: 3-Solves basic 50 - 74% of the time/requires cueing 25 - 49% of the time Memory Memory: 3-Recognizes or recalls 50 - 74% of the time/requires cueing 25 - 49% of the time FIM - Eating Eating Activity: 5: Supervision/cues  Pain Pain Assessment Pain Assessment: Faces Faces Pain Scale: Hurts a little bit Pain Type: Chronic pain Pain Location: Back Pain Orientation: Mid;Lower Pain Descriptors / Indicators: Aching Pain Onset: On-going Pain Intervention(s): Repositioned  Therapy/Group: Individual Therapy  Jackalyn LombardNicole Hilmar Moldovan, M.A. CCC-SLP  Richetta Cubillos, Melanee SpryNicole L 09/27/2013, 10:39 AM

## 2013-09-27 NOTE — Progress Notes (Addendum)
Physical Therapy Note  Patient Details  Name: Elijah [name] MRN: [id number] Date of Birth: 09/25/1933 Today's Date: 09/27/2013 1105-1150, 45 min; 1545-1608, 23 min  individual therapy  Tx 1:  Pain at rest rated "not too bad" before tx; increases during sit> stand and standing and gait. Lidocaine patches are helping, but application/ timing will be focused more on use during activity, per MD.  Supine> sit in flat bed without rails with min assist, slowly due to low back pain. Squat pivot bed> w/c with min assist, max VCs for clear wheel of w/c.  W/c propulsion in regular w/c x 120' with supervision, extra time for self correction. Sit>< stand at wall bar x4 15 seconds max, with mod assist.  Sit>< stand at grocery cart x 2; gait x 12' with mod assist and second person needed to follow with w/c; focus on trunk extension and knee extension.  neuromuscular re-education bil LEs for stance stability: bil hip adduction against resistance 10 x 2, long arc knee ext with isometric hold at end range with eccentric control 6 x 1, with pt counting.  Pt left sitting in regular w/c in room, quick release belt on and call bell within reach.  Tx 2:  Pain rated " just a little bit" ; premedicated.  Input provided to RN regarding optimal placement of   Pt seen bedside for neuromuscular re-education with manual assistance PRN and with VCs, for bil LE isolated movements, eccentric control, trunk activation for 2 x 10 lower trunk rotation, alternating ankle pumps; 10 x 1modified abdominal crunches, R and L quad sets, bridging, straight leg raises.  Pt repositioned in R sidelying with pillow between knees.  He indicated how he would use the call bell on railing PRN.  Deloise Marchant 09/27/2013, 7:58 AM

## 2013-09-28 ENCOUNTER — Inpatient Hospital Stay (HOSPITAL_COMMUNITY): Payer: Medicare Other | Admitting: Speech Pathology

## 2013-09-28 ENCOUNTER — Inpatient Hospital Stay (HOSPITAL_COMMUNITY): Payer: Medicare Other | Admitting: Occupational Therapy

## 2013-09-28 ENCOUNTER — Inpatient Hospital Stay (HOSPITAL_COMMUNITY): Payer: Medicare Other | Admitting: Physical Therapy

## 2013-09-28 DIAGNOSIS — I6340 Cerebral infarction due to embolism of unspecified cerebral artery: Secondary | ICD-10-CM

## 2013-09-28 LAB — COMPREHENSIVE METABOLIC PANEL
ALT: 29 U/L (ref 0–53)
AST: 43 U/L — ABNORMAL HIGH (ref 0–37)
Albumin: 1.8 g/dL — ABNORMAL LOW (ref 3.5–5.2)
Alkaline Phosphatase: 941 U/L — ABNORMAL HIGH (ref 39–117)
Anion gap: 13 (ref 5–15)
BUN: 13 mg/dL (ref 6–23)
CO2: 20 mEq/L (ref 19–32)
Calcium: 8 mg/dL — ABNORMAL LOW (ref 8.4–10.5)
Chloride: 104 mEq/L (ref 96–112)
Creatinine, Ser: 0.64 mg/dL (ref 0.50–1.35)
GFR calc Af Amer: >90 mL/min (ref >90)
GFR calc non Af Amer: >90 mL/min (ref >90)
Glucose, Bld: 163 mg/dL — ABNORMAL HIGH (ref 70–99)
Potassium: 4.2 mEq/L (ref 3.7–5.3)
Sodium: 137 mEq/L (ref 137–147)
Total Bilirubin: 0.5 mg/dL (ref 0.3–1.2)
Total Protein: 7.4 g/dL (ref 6.0–8.3)

## 2013-09-28 LAB — GLUCOSE, CAPILLARY
Glucose-Capillary: 112 mg/dL — ABNORMAL HIGH (ref 70–99)
Glucose-Capillary: 224 mg/dL — ABNORMAL HIGH (ref 70–99)
Glucose-Capillary: 151 mg/dL — ABNORMAL HIGH (ref 70–99)
Glucose-Capillary: 138 mg/dL — ABNORMAL HIGH (ref 70–99)

## 2013-09-28 LAB — CBC
HCT: 24 % — ABNORMAL LOW (ref 39.0–52.0)
Hemoglobin: 7.7 g/dL — ABNORMAL LOW (ref 13.0–17.0)
MCH: 30.6 pg (ref 26.0–34.0)
MCHC: 32.1 g/dL (ref 30.0–36.0)
MCV: 95.2 fL (ref 78.0–100.0)
Platelets: 234 10*3/uL (ref 150–400)
RBC: 2.52 MIL/uL — ABNORMAL LOW (ref 4.22–5.81)
RDW: 16.5 % — ABNORMAL HIGH (ref 11.5–15.5)
WBC: 14.5 10*3/uL — ABNORMAL HIGH (ref 4.0–10.5)

## 2013-09-28 MED ORDER — LIDOCAINE 5 % EX PTCH
2.0000 | MEDICATED_PATCH | CUTANEOUS | Status: DC
  Administered 2013-09-28 – 2013-10-10 (×13): 2 via TRANSDERMAL
  Filled 2013-09-28 (×13): qty 2

## 2013-09-28 NOTE — Progress Notes (Signed)
Speech Language Pathology Daily Session Note  Patient Details  Name: Elijah [name] MRN: [name] Date of Birth: 10/10/1933  Today's Date: 09/28/2013 Time: 1101-1201 Time Calculation (min): 60 min  Short Term Goals: Week 3: SLP Short Term Goal 1 (Week 3): Pt will demonstrate effectient mastication of Dys 2 textures with Min verbal cues SLP Short Term Goal 2 (Week 3): Pt will utilize speech intelligibility strategies at the phrase level with Min cues SLP Short Term Goal 3 (Week 3): Pt will consistently sustain attention to basic familiar task for 10 minutes with Mod cues SLP Short Term Goal 4 (Week 3): Pt will request help as needed with Mod question cues  SLP Short Term Goal 5 (Week 3): Pt will demonstrate basic problem solving during basic, familiar task with Min cues SLP Short Term Goal 6 (Week 3): Pt will utilize external memory aids to recall new and daily information with Mod cues  Skilled Therapeutic Interventions:  Pt was seen for skilled speech therapy targeting dysphagia management, sustained attention, and basic problem solving.  SLP completed skilled observations with trials of dys 2 textures with pt exhibiting no overt s/s of aspiration, and adequate mastication and oral clearance of solids.  Recommend a diet upgrade to dys 2 textures with continued nectar thick liquids and full supervision.  RN aware.  In addition, SLP engaged pt in a structured new learning task targeting sustained attention and basic problem solving.  Pt required mod faded to min assist verbal cues for error awareness, min cues for redirection in the setting of environmental distractions.  In addition, pt required initial supervision cues to set up the game following multiple demonstrations by SLP.  Continue per current plan of care.    FIM:  Comprehension Comprehension Mode: Auditory Comprehension: 4-Understands basic 75 - 89% of the time/requires cueing 10 - 24% of the time Expression Expression Mode:  Verbal Expression: 5-Expresses basic 90% of the time/requires cueing < 10% of the time. Social Interaction Social Interaction: 4-Interacts appropriately 75 - 89% of the time - Needs redirection for appropriate language or to initiate interaction. Problem Solving Problem Solving: 4-Solves basic 75 - 89% of the time/requires cueing 10 - 24% of the time Memory Memory: 3-Recognizes or recalls 50 - 74% of the time/requires cueing 25 - 49% of the time FIM - Eating Eating Activity: 5: Supervision/cues  Pain Pain Assessment Pain Assessment: Faces Pain Score: 1  Faces Pain Scale: Hurts a little bit Pain Type: Chronic pain Pain Location: Back Pain Orientation: Mid;Lower Pain Descriptors / Indicators: Aching Pain Onset: On-going Pain Intervention(s): Repositioned  Therapy/Group: Individual Therapy  Jackalyn LombardNicole Mimi Debellis, M.A. CCC-SLP  Tiffanie Blassingame, Melanee SpryNicole L 09/28/2013, 4:16 PM

## 2013-09-28 NOTE — Progress Notes (Signed)
Occupational Therapy Session Note  Patient Details  Name: Elijah [name] MRN: [id number] Date of Birth: 09/01/1933  Today's Date: 09/28/2013 Time: 1000-1100 Time Calculation (min): 60 min  Short Term Goals: Week 3:  OT Short Term Goal 1 (Week 3): Pt will transfer on and off toilet with min A using grab bars and squat or stand pivot transfer. OT Short Term Goal 2 (Week 3): Pt will stand up from w/c with steady A only to manage clothing over hips. OT Short Term Goal 3 (Week 3): Pt will be able to stand to wash buttocks and perineal area with steady A. OT Short Term Goal 4 (Week 3): Pt will be able to don pants over feet using a reacher with min A.  Skilled Therapeutic Interventions/Progress Updates:  Patient sleeping in bed upon arrival.  Engaged in self care retraining to include bed mobility, safe bed>w/c squat pivot transfer, sponge bath at sink and dressing.  Focused session on activity tolerance, sit><stands, static and dynamic standing balance, standing tolerance, use of reacher to improve independence with LB dressing.  Patient requires vcs to be thorough with bathing peri area and buttocks and with use of reacher.  Patient able to doff socks and donn pants with reacher and vcs. Patient was mod assist with squat pivot transfers and close supervision-min assist for sit><stands.  Therapy Documentation Precautions:  Precautions Precautions: Fall Precaution Comments:  pain low back limits volitional movement Restrictions Weight Bearing Restrictions: No Pain: 6/10 back pain, rest, repositioned, RN notified and medication provided ADL: See FIM for current functional status  Therapy/Group: Individual Therapy  Montavius Subramaniam 09/28/2013, 3:31 PM

## 2013-09-28 NOTE — Progress Notes (Signed)
Physical Therapy Session Note  Patient Details  Name: Elijah [name] MRN: [id number] Date of Birth: 02/08/1934  Today's Date: 09/28/2013 Time: [id number] Time Calculation (min): 55 min   Skilled Therapeutic Interventions/Progress Updates:   Pt received supine in bed, reporting increased low back pain, RN notified. With increased time and coaxing, pt transferred supine >sit with HOB flat and use of rail with supervision. Pt sat EOB x 3-5 min, then performed squat pivot transfer bed > w/c with mod A x 2. RN present for pain medication administration. W/c propulsion using BUEs x 150 ft with S, increased time. Gait training using EVA walker x 30 ft and x 20 ft with mod A x 1 and +2 assist for w/c follow and IV pole management. Pt requires vc's for upright posture and hand placement with sit <> stand transfers. Ambulation trials ended due to increased LBP. PT performed UBE for UE strengthening/endurance at Level 1 forward and backward x total of 10 Min including rest breaks. Pt requesting to return to bed. Returned to room in w/c, squat pivot transfer w/c > bed mod A, sit > supine with supervision. Pt left supine in bed with all needs within reach.   Therapy Documentation Precautions:  Precautions Precautions: Fall Precaution Comments:  pain low back limits volitional movement Restrictions Weight Bearing Restrictions: No General: Amount of Missed PT Time (min): 5 Minutes Missed Time Reason: Pain Vital Signs: Therapy Vitals Temp: 98.3 F (36.8 C) Temp src: Oral Pulse Rate: 50 Resp: 18 BP: 133/72 mmHg Patient Position (if appropriate): Lying Oxygen Therapy SpO2: 98 % O2 Device: None (Room air) Pain: Pain Assessment Pain Assessment: 0-10 Pain Score: 6  Pain Location: Back Pain Orientation: Mid;Lower Pain Descriptors / Indicators: Aching Pain Intervention(s): Medication (See eMAR)   See FIM for current functional status  Therapy/Group: Individual Therapy  Elijah [name], Elijah [name]  A 09/28/2013, 2:48 PM

## 2013-09-28 NOTE — Progress Notes (Signed)
Subjective/Complaints: States that back is feeling better. Therapy notes a difference as well.  Review of Systems - limited secondary to mental status  Objective: Vital Signs: Blood pressure 123/70, pulse 85, temperature 97.7 F (36.5 C), temperature source Oral, resp. rate 18, weight 85.594 kg (188 lb 11.2 oz), SpO2 99.00%. No results found. Results for orders placed during the hospital encounter of 09/11/13 (from the past 72 hour(s))  GLUCOSE, CAPILLARY     Status: Abnormal   Collection Time    09/25/13 11:16 AM      Result Value Ref Range   Glucose-Capillary 196 (*) 70 - 99 mg/dL  GLUCOSE, CAPILLARY     Status: Abnormal   Collection Time    09/25/13  4:34 PM      Result Value Ref Range   Glucose-Capillary 155 (*) 70 - 99 mg/dL  GLUCOSE, CAPILLARY     Status: Abnormal   Collection Time    09/25/13  8:50 PM      Result Value Ref Range   Glucose-Capillary 169 (*) 70 - 99 mg/dL   Comment 1 Notify RN    GLUCOSE, CAPILLARY     Status: Abnormal   Collection Time    09/26/13  7:23 AM      Result Value Ref Range   Glucose-Capillary 137 (*) 70 - 99 mg/dL   Comment 1 Notify RN    GLUCOSE, CAPILLARY     Status: Abnormal   Collection Time    09/26/13 11:15 AM      Result Value Ref Range   Glucose-Capillary 147 (*) 70 - 99 mg/dL   Comment 1 Notify RN    GLUCOSE, CAPILLARY     Status: Abnormal   Collection Time    09/26/13  4:47 PM      Result Value Ref Range   Glucose-Capillary 131 (*) 70 - 99 mg/dL   Comment 1 Notify RN    GLUCOSE, CAPILLARY     Status: Abnormal   Collection Time    09/26/13  8:38 PM      Result Value Ref Range   Glucose-Capillary 162 (*) 70 - 99 mg/dL   Comment 1 Notify RN    GLUCOSE, CAPILLARY     Status: Abnormal   Collection Time    09/27/13  7:10 AM      Result Value Ref Range   Glucose-Capillary 103 (*) 70 - 99 mg/dL   Comment 1 Notify RN    GLUCOSE, CAPILLARY     Status: Abnormal   Collection Time    09/27/13 11:48 AM      Result Value Ref  Range   Glucose-Capillary 156 (*) 70 - 99 mg/dL   Comment 1 Notify RN    GLUCOSE, CAPILLARY     Status: Abnormal   Collection Time    09/27/13  4:46 PM      Result Value Ref Range   Glucose-Capillary 126 (*) 70 - 99 mg/dL   Comment 1 Notify RN    GLUCOSE, CAPILLARY     Status: Abnormal   Collection Time    09/27/13  9:16 PM      Result Value Ref Range   Glucose-Capillary 165 (*) 70 - 99 mg/dL  GLUCOSE, CAPILLARY     Status: Abnormal   Collection Time    09/28/13  7:01 AM      Result Value Ref Range   Glucose-Capillary 112 (*) 70 - 99 mg/dL   Comment 1 Notify RN    CBC  Status: Abnormal   Collection Time    09/28/13  8:40 AM      Result Value Ref Range   WBC 14.5 (*) 4.0 - 10.5 K/uL   RBC 2.52 (*) 4.22 - 5.81 MIL/uL   Hemoglobin 7.7 (*) 13.0 - 17.0 g/dL   HCT 24.0 (*) 39.0 - 52.0 %   MCV 95.2  78.0 - 100.0 fL   MCH 30.6  26.0 - 34.0 pg   MCHC 32.1  30.0 - 36.0 g/dL   RDW 16.5 (*) 11.5 - 15.5 %   Platelets 234  150 - 400 K/uL     HEENT: normal Cardio: irregular and no murmurs Resp: CTA B/L and unlabored GI: BS positive and large bilateral inguinal mass, nontender Extremity:  No Edema  Neuro: Confused, , Abnormal Motor 4/5 in BUE, 3 to 3+/5 in BLE, Abnormal FMC Ataxic/ dec FMC, Dysarthric and Apraxic. More alert Musc/Skel:  Normal Gen NAD Skin:  Heels red  Assessment/Plan: 1. Functional deficits secondary to bilateral cerebral infarct embolic which require 3+ hours per day of interdisciplinary therapy in a comprehensive inpatient rehab setting. Physiatrist is providing close team supervision and 24 hour management of active medical problems listed below. Physiatrist and rehab team continue to assess barriers to discharge/monitor patient progress toward functional and medical goals.  FIM: FIM - Bathing Bathing Steps Patient Completed: Chest;Right Arm;Left Arm;Abdomen;Right upper leg;Left upper leg Bathing: 3: Mod-Patient completes 5-7 25f10 parts or 50-74%  FIM  - Upper Body Dressing/Undressing Upper body dressing/undressing steps patient completed: Put head through opening of pull over shirt/dress;Thread/unthread right sleeve of pullover shirt/dresss;Thread/unthread left sleeve of pullover shirt/dress;Pull shirt over trunk Upper body dressing/undressing: 5: Supervision: Safety issues/verbal cues FIM - Lower Body Dressing/Undressing Lower body dressing/undressing steps patient completed: Pull pants up/down Lower body dressing/undressing: 2: Max-Patient completed 25-49% of tasks  FIM - Toileting Toileting: 0: Activity did not occur  FIM - TRadio producerDevices: Bedside commode;Grab bars Toilet Transfers: 3-From toilet/BSC: Mod A (lift or lower assist);3-To toilet/BSC: Mod A (lift or lower assist)  FIM - Bed/Chair Transfer Bed/Chair Transfer Assistive Devices: Arm rests;HOB elevated;Bed rails Bed/Chair Transfer: 3: Supine > Sit: Mod A (lifting assist/Pt. 50-74%/lift 2 legs;4: Bed > Chair or W/C: Min A (steadying Pt. > 75%)  FIM - Locomotion: Wheelchair Distance: 120 Locomotion: Wheelchair: 2: Travels 50 - 149 ft with supervision, cueing or coaxing FIM - Locomotion: Ambulation Locomotion: Ambulation Assistive Devices: Other (comment) (grocery cart) Ambulation/Gait Assistance: 1: +2 Total assist Locomotion: Ambulation: 1: Two helpers  Comprehension Comprehension Mode: Auditory Comprehension: 4-Understands basic 75 - 89% of the time/requires cueing 10 - 24% of the time  Expression Expression Mode: Verbal Expression: 4-Expresses basic 75 - 89% of the time/requires cueing 10 - 24% of the time. Needs helper to occlude trach/needs to repeat words.  Social Interaction Social Interaction: 4-Interacts appropriately 75 - 89% of the time - Needs redirection for appropriate language or to initiate interaction.  Problem Solving Problem Solving: 3-Solves basic 50 - 74% of the time/requires cueing 25 - 49% of the  time  Memory Memory: 3-Recognizes or recalls 50 - 74% of the time/requires cueing 25 - 49% of the time  Medical Problem List and Plan:  1. Functional deficits secondary to acute cerebral white matter bilateral infarct  2. DVT Prophylaxis/Anticoagulation:off Eliquis per GI due to liver failure, change to ASA until ok to resume Eliquis. Off lovenox due to bleeding 3. Pain Management: Tylenol as needed. lidoderm patches helpful for low  back. 4. Dysphagia with decreased nutritional storage. Dysphagia 1 nectar liquids.   -appetite showing some signs of improvement 5. Neuropsych: This patient is not capable of making decisions on his own behalf.  6. Hypertension/atrial fibrillation. Norvasc 10 mg daily, hydralazine 10 mg 4 times a day, metoprolol 25 mg every 8 hours.   7 ID/methicillin sensitive staph aureus. Continue cefazolin 2 g intravenously every 8 hours x4 weeks total initiated 08/29/2013.   8. Diabetes mellitus with peripheral neuropathy.  (some improvement noted)  -increased Levemir to 25 units each bedtime.   -sugars much improved 9. Integumentary: local skin care, turning, maintain foley for now, mattress overlay, WOC, Diflucan for yeast PRAFOs for heels 10.  Inguinal mass, bilateral hernia, obviously chronic and not incarcerated, given recent CVA, no elective surg recommended at this time 11.  Elevated alk phos: off Lipitor,  ? lipitor vs autoimmune with increased IgG and IgA, liver ultrasound OK, appreciate close GI f/u  -AP trending down  -CMET pending today 12.  Epistaxis d/c'ed  lovenox , afrin,  13. Anemia: likely related to illness. hgb hovering around 8. WBC's elevated but consistent with previous draws  -add iron supplement  -check iron panel LOS (Days) 17 A FACE TO FACE EVALUATION WAS PERFORMED  SWARTZ,ZACHARY T 09/28/2013, 9:11 AM

## 2013-09-29 ENCOUNTER — Inpatient Hospital Stay (HOSPITAL_COMMUNITY): Payer: Medicare Other | Admitting: Occupational Therapy

## 2013-09-29 ENCOUNTER — Inpatient Hospital Stay (HOSPITAL_COMMUNITY): Payer: Medicare Other | Admitting: *Deleted

## 2013-09-29 LAB — GLUCOSE, CAPILLARY
Glucose-Capillary: 125 mg/dL — ABNORMAL HIGH (ref 70–99)
Glucose-Capillary: 139 mg/dL — ABNORMAL HIGH (ref 70–99)
Glucose-Capillary: 189 mg/dL — ABNORMAL HIGH (ref 70–99)
Glucose-Capillary: 190 mg/dL — ABNORMAL HIGH (ref 70–99)

## 2013-09-29 NOTE — Progress Notes (Signed)
Physical Therapy Session Note  Patient Details  Name: Elijah [name] MRN: [id number] Date of Birth: 01/23/1934  Today's Date: 09/29/2013 Time: [id number] Time Calculation (min): 30 min   Skilled Therapeutic Interventions/Progress Updates:  Patient in bed , complains of pain in lower back 5/10 , when asked if he needs pain medicine, patient stated he does not want any and there is no need to get the nurse.  Transfer to w/c with min A .  W/C propulsion from room to the gym with 2 rest breaks.  Arm ergometer 1 x 8 min with 1 rest break.  Patient returned to room ,transfered to bed with min A, all needs within reach , bed alarm on.  Therapy Documentation Precautions:  Precautions Precautions: Fall Precaution Comments:  pain low back limits volitional movement Restrictions Weight Bearing Restrictions: No Vital Signs: Therapy Vitals Temp: 97.9 F (36.6 C) Temp src: Oral Pulse Rate: 80 Resp: 19 BP: 120/71 mmHg Patient Position (if appropriate): Lying Oxygen Therapy SpO2: 97 % O2 Device: None (Room air) Pain: Pain Assessment Faces Pain Scale: Hurts little more Pain Location: Back Pain Orientation: Lower Pain Descriptors / Indicators: Aching Pain Intervention(s): Medication (See eMAR)  See FIM for current functional status  Therapy/Group: Individual Therapy  Dorna MaiCzajkowska, Donne Robillard W 09/29/2013, 3:44 PM

## 2013-09-29 NOTE — Progress Notes (Addendum)
Subjective/Complaints: No new issues. Slept well. Back pain better. Feels that he's improving Review of Systems - limited secondary to mental status  Objective: Vital Signs: Blood pressure 136/83, pulse 92, temperature 98.3 F (36.8 C), temperature source Oral, resp. rate 18, weight 85.594 kg (188 lb 11.2 oz), SpO2 98.00%. No results found. Results for orders placed during the hospital encounter of 09/11/13 (from the past 72 hour(s))  GLUCOSE, CAPILLARY     Status: Abnormal   Collection Time    09/26/13 11:15 AM      Result Value Ref Range   Glucose-Capillary 147 (*) 70 - 99 mg/dL   Comment 1 Notify RN    GLUCOSE, CAPILLARY     Status: Abnormal   Collection Time    09/26/13  4:47 PM      Result Value Ref Range   Glucose-Capillary 131 (*) 70 - 99 mg/dL   Comment 1 Notify RN    GLUCOSE, CAPILLARY     Status: Abnormal   Collection Time    09/26/13  8:38 PM      Result Value Ref Range   Glucose-Capillary 162 (*) 70 - 99 mg/dL   Comment 1 Notify RN    GLUCOSE, CAPILLARY     Status: Abnormal   Collection Time    09/27/13  7:10 AM      Result Value Ref Range   Glucose-Capillary 103 (*) 70 - 99 mg/dL   Comment 1 Notify RN    GLUCOSE, CAPILLARY     Status: Abnormal   Collection Time    09/27/13 11:48 AM      Result Value Ref Range   Glucose-Capillary 156 (*) 70 - 99 mg/dL   Comment 1 Notify RN    GLUCOSE, CAPILLARY     Status: Abnormal   Collection Time    09/27/13  4:46 PM      Result Value Ref Range   Glucose-Capillary 126 (*) 70 - 99 mg/dL   Comment 1 Notify RN    GLUCOSE, CAPILLARY     Status: Abnormal   Collection Time    09/27/13  9:16 PM      Result Value Ref Range   Glucose-Capillary 165 (*) 70 - 99 mg/dL  GLUCOSE, CAPILLARY     Status: Abnormal   Collection Time    09/28/13  7:01 AM      Result Value Ref Range   Glucose-Capillary 112 (*) 70 - 99 mg/dL   Comment 1 Notify RN    CBC     Status: Abnormal   Collection Time    09/28/13  8:40 AM      Result Value  Ref Range   WBC 14.5 (*) 4.0 - 10.5 K/uL   RBC 2.52 (*) 4.22 - 5.81 MIL/uL   Hemoglobin 7.7 (*) 13.0 - 17.0 g/dL   HCT 24.0 (*) 39.0 - 52.0 %   MCV 95.2  78.0 - 100.0 fL   MCH 30.6  26.0 - 34.0 pg   MCHC 32.1  30.0 - 36.0 g/dL   RDW 16.5 (*) 11.5 - 15.5 %   Platelets 234  150 - 400 K/uL  COMPREHENSIVE METABOLIC PANEL     Status: Abnormal   Collection Time    09/28/13  8:40 AM      Result Value Ref Range   Sodium 137  137 - 147 mEq/L   Potassium 4.2  3.7 - 5.3 mEq/L   Chloride 104  96 - 112 mEq/L   CO2 20  19 -  32 mEq/L   Glucose, Bld 163 (*) 70 - 99 mg/dL   BUN 13  6 - 23 mg/dL   Creatinine, Ser 0.64  0.50 - 1.35 mg/dL   Calcium 8.0 (*) 8.4 - 10.5 mg/dL   Total Protein 7.4  6.0 - 8.3 g/dL   Albumin 1.8 (*) 3.5 - 5.2 g/dL   AST 43 (*) 0 - 37 U/L   ALT 29  0 - 53 U/L   Alkaline Phosphatase 941 (*) 39 - 117 U/L   Total Bilirubin 0.5  0.3 - 1.2 mg/dL   GFR calc non Af Amer >90  >90 mL/min   GFR calc Af Amer >90  >90 mL/min   Comment: (NOTE)     The eGFR has been calculated using the CKD EPI equation.     This calculation has not been validated in all clinical situations.     eGFR's persistently <90 mL/min signify possible Chronic Kidney     Disease.   Anion gap 13  5 - 15  GLUCOSE, CAPILLARY     Status: Abnormal   Collection Time    09/28/13 11:27 AM      Result Value Ref Range   Glucose-Capillary 224 (*) 70 - 99 mg/dL   Comment 1 Notify RN    GLUCOSE, CAPILLARY     Status: Abnormal   Collection Time    09/28/13  4:26 PM      Result Value Ref Range   Glucose-Capillary 151 (*) 70 - 99 mg/dL   Comment 1 Notify RN    GLUCOSE, CAPILLARY     Status: Abnormal   Collection Time    09/28/13  8:56 PM      Result Value Ref Range   Glucose-Capillary 138 (*) 70 - 99 mg/dL  GLUCOSE, CAPILLARY     Status: Abnormal   Collection Time    09/29/13  6:46 AM      Result Value Ref Range   Glucose-Capillary 125 (*) 70 - 99 mg/dL     HEENT: normal Cardio: irregular and no  murmurs Resp: CTA B/L and unlabored GI: BS positive and large bilateral inguinal mass, nontender Extremity:  No Edema  Neuro: Confused, , Abnormal Motor 4/5 in BUE, 3 to 3+/5 in BLE, Abnormal FMC Ataxic/ dec FMC, Dysarthric and Apraxic. More alert Musc/Skel:  Normal Gen NAD Skin:  Heels red  Assessment/Plan: 1. Functional deficits secondary to bilateral cerebral infarct embolic which require 3+ hours per day of interdisciplinary therapy in a comprehensive inpatient rehab setting. Physiatrist is providing close team supervision and 24 hour management of active medical problems listed below. Physiatrist and rehab team continue to assess barriers to discharge/monitor patient progress toward functional and medical goals.  FIM: FIM - Bathing Bathing Steps Patient Completed: Chest;Right Arm;Left Arm;Abdomen;Right upper leg;Left upper leg Bathing: 3: Mod-Patient completes 5-7 52f10 parts or 50-74%  FIM - Upper Body Dressing/Undressing Upper body dressing/undressing steps patient completed: Put head through opening of pull over shirt/dress;Thread/unthread right sleeve of pullover shirt/dresss;Thread/unthread left sleeve of pullover shirt/dress;Pull shirt over trunk Upper body dressing/undressing: 5: Supervision: Safety issues/verbal cues FIM - Lower Body Dressing/Undressing Lower body dressing/undressing steps patient completed: Thread/unthread right pants leg;Thread/unthread left pants leg;Pull pants up/down (wears diaper) Lower body dressing/undressing: 3: Mod-Patient completed 50-74% of tasks  FIM - Toileting Toileting: 0: Activity did not occur  FIM - TRadio producerDevices: Bedside commode;Grab bars Toilet Transfers: 3-From toilet/BSC: Mod A (lift or lower assist);3-To toilet/BSC: Mod A (lift or  lower assist)  FIM - Bed/Chair Transfer Bed/Chair Transfer Assistive Devices: Arm rests;Bed rails Bed/Chair Transfer: 5: Supine > Sit: Supervision (verbal  cues/safety issues);5: Sit > Supine: Supervision (verbal cues/safety issues);3: Bed > Chair or W/C: Mod A (lift or lower assist);3: Chair or W/C > Bed: Mod A (lift or lower assist)  FIM - Locomotion: Wheelchair Distance: 120 Locomotion: Wheelchair: 2: Travels 50 - 149 ft with supervision, cueing or coaxing FIM - Locomotion: Ambulation Locomotion: Ambulation Assistive Devices: Nutritional therapist Ambulation/Gait Assistance: 1: +2 Total assist;3: Mod assist Locomotion: Ambulation: 1: Two helpers (30 ft, 20 ft)  Comprehension Comprehension Mode: Auditory Comprehension: 4-Understands basic 75 - 89% of the time/requires cueing 10 - 24% of the time  Expression Expression Mode: Verbal Expression: 5-Expresses basic 90% of the time/requires cueing < 10% of the time.  Social Interaction Social Interaction: 4-Interacts appropriately 75 - 89% of the time - Needs redirection for appropriate language or to initiate interaction.  Problem Solving Problem Solving: 4-Solves basic 75 - 89% of the time/requires cueing 10 - 24% of the time  Memory Memory: 3-Recognizes or recalls 50 - 74% of the time/requires cueing 25 - 49% of the time  Medical Problem List and Plan:  1. Functional deficits secondary to acute cerebral white matter bilateral infarct  2. DVT Prophylaxis/Anticoagulation:off Eliquis per GI due to liver failure, change to ASA until ok to resume Eliquis. Off lovenox due to bleeding 3. Pain Management: Tylenol as needed. lidoderm patches helpful for low back. 4. Dysphagia with decreased nutritional storage. Dysphagia 1 nectar liquids.   -appetite showing some signs of improvement 5. Neuropsych: This patient is not capable of making decisions on his own behalf.  6. Hypertension/atrial fibrillation. Norvasc 10 mg daily, hydralazine 10 mg 4 times a day, metoprolol 25 mg every 8 hours.   7 ID/methicillin sensitive staph aureus. Continue cefazolin 2 g intravenously every 8 hours x4 weeks total initiated  08/29/2013.   8. Diabetes mellitus with peripheral neuropathy.     -increased Levemir to 25 units each bedtime.   -sugars much improved as a whole 9. Integumentary: local skin care, turning, maintain foley for now, mattress overlay, WOC, Diflucan for yeast PRAFOs for heels 10.  Inguinal mass, bilateral hernia, obviously chronic and not incarcerated, given recent CVA, no elective surg recommended at this time 11.  Elevated alk phos: off Lipitor,  ? lipitor vs autoimmune with increased IgG and IgA, liver ultrasound OK, appreciate close GI f/u  -AP and other LFT's continue to trend down  12.  Epistaxis: resolved 13. Anemia: recent iron tests indicate anemia of chronic disease.    -hgb 7.7 most recently. WBC's elevated but consistent with previous draws  -recheck monday    LOS (Days) 18 A FACE TO FACE EVALUATION WAS PERFORMED  Chiann Goffredo T 09/29/2013, 8:12 AM

## 2013-09-29 NOTE — Progress Notes (Signed)
Occupational Therapy Session Note  Patient Details  Name: Elijah [name] MRN: [id number] Date of Birth: 01/01/1934  Today's Date: 09/29/2013 Time: [id number] Time Calculation (min): 38 min  Skilled Therapeutic Interventions/Progress Updates: Though Elijah [name] at times made confusing comments, he was jovial and oriented enough to follow instructions for therapy.    He completed bed mobility with Min A and leaned heavily to his right upon first going supine to sit EOB.   Once stable and after sitting EOB he completed a Min A stand pivot transfer to his recliner.   Once in his chair he requested his puzzle and find a word books and began working the activities within the books.   He was left with safety belt in his recliner with call bell and phone in place.   As well, this clinician informed nursing that he was sitting up in his chair.     Therapy Documentation Precautions:  Precautions Precautions: Fall Precaution Comments:  pain low back limits volitional movement Restrictions Weight Bearing Restrictions: No  Pain: "Aw.  It really hurts" but could not rate his low back pain.   RN brought in pain meds   See FIM for current functional status  Therapy/Group: Individual Therapy  Elijah [name], Elijah [name] [facility] CenterYeary 09/29/2013, 4:28 PM

## 2013-09-30 ENCOUNTER — Inpatient Hospital Stay (HOSPITAL_COMMUNITY): Payer: Medicare Other | Admitting: Occupational Therapy

## 2013-09-30 ENCOUNTER — Inpatient Hospital Stay (HOSPITAL_COMMUNITY): Payer: Medicare Other | Admitting: *Deleted

## 2013-09-30 LAB — GLUCOSE, CAPILLARY
Glucose-Capillary: 135 mg/dL — ABNORMAL HIGH (ref 70–99)
Glucose-Capillary: 186 mg/dL — ABNORMAL HIGH (ref 70–99)
Glucose-Capillary: 176 mg/dL — ABNORMAL HIGH (ref 70–99)
Glucose-Capillary: 190 mg/dL — ABNORMAL HIGH (ref 70–99)

## 2013-09-30 MED ORDER — MEGESTROL ACETATE 400 MG/10ML PO SUSP
400.0000 mg | Freq: Two times a day (BID) | ORAL | Status: DC
  Administered 2013-09-30 – 2013-10-06 (×7): 400 mg via ORAL
  Filled 2013-09-30 (×23): qty 10

## 2013-09-30 NOTE — Progress Notes (Signed)
Physical Therapy Session Note  Patient Details  Name: Stormy CardJames A Solorzano MRN: [id number] Date of Birth: 02/16/1934  Today's Date: 09/30/2013 Time: 1010-1040 Time Calculation (min): 30 min   Skilled Therapeutic Interventions/Progress Updates:  Patient in bed, agrees to session. Transfer to sit with Supervision and to w/c with min A. W/C propulsion to/from the gym with min A and increased need for VC due to often stops.  In II bars sit to stand x 3 with min A, patient able to maintain standing , and marching holding to B rails.  Patient refused to participate in gait training as he states his pain is increasing in his lower back, but he wants to do ride the arm bike. Patient able to complete 5 min on an ergometer.  Patient returned to room and transferred to bed with min A. All needs within reach.  Nurse notified of pain complain.   Therapy Documentation Precautions:  Precautions Precautions: Fall Precaution Comments:  pain low back limits volitional movement Restrictions Weight Bearing Restrictions: No Pain: Pain Assessment Pain Score: 4  Pain Location: Back Pain Orientation: Lower Pain Intervention(s): Medication (See eMAR)  See FIM for current functional status  Therapy/Group: Individual Therapy  Dorna MaiCzajkowska, Siddhi Dornbush W 09/30/2013, 12:24 PM

## 2013-09-30 NOTE — Progress Notes (Signed)
Occupational Therapy Session Note  Patient Details  Name: Elijah [name] MRN: [id number] Date of Birth: 03/18/1934  Today's Date: 09/30/2013 Time: [id number] Time Calculation (min): 45 min   Skilled Therapeutic Interventions/Progress Updates: Patient scheduled for ADL but stated he was so pained (4/10 "across low back"   RN had already given meds)  That he was only able to tolerate going supine to EOB and washing and dressing upper body.  (He had completed a PT session about 30 minutes before this OT session).  Clinician completed his lower body dressing with him supine on bed.  He did assist with donning his pants.   Offered Kpad but he stated, "That thing does no good."       Therapy Documentation Precautions:  Precautions Precautions: Fall Precaution Comments:  pain low back limits volitional movement Restrictions Weight Bearing Restrictions: No   Pain:  4/10 RN had previously given meds   See FIM for current functional status  Therapy/Group: Individual Therapy  Elijah [name], Elijah [name] [facility] 09/30/2013, 1:43 PM

## 2013-09-30 NOTE — Progress Notes (Signed)
Subjective/Complaints: No complaints. Pain under better control. Still not eating much Review of Systems - limited secondary to mental status  Objective: Vital Signs: Blood pressure 111/73, pulse 92, temperature 98.1 F (36.7 C), temperature source Oral, resp. rate 18, weight 85.594 kg (188 lb 11.2 oz), SpO2 99.00%. No results found. Results for orders placed during the hospital encounter of 09/11/13 (from the past 72 hour(s))  GLUCOSE, CAPILLARY     Status: Abnormal   Collection Time    09/27/13 11:48 AM      Result Value Ref Range   Glucose-Capillary 156 (*) 70 - 99 mg/dL   Comment 1 Notify RN    GLUCOSE, CAPILLARY     Status: Abnormal   Collection Time    09/27/13  4:46 PM      Result Value Ref Range   Glucose-Capillary 126 (*) 70 - 99 mg/dL   Comment 1 Notify RN    GLUCOSE, CAPILLARY     Status: Abnormal   Collection Time    09/27/13  9:16 PM      Result Value Ref Range   Glucose-Capillary 165 (*) 70 - 99 mg/dL  GLUCOSE, CAPILLARY     Status: Abnormal   Collection Time    09/28/13  7:01 AM      Result Value Ref Range   Glucose-Capillary 112 (*) 70 - 99 mg/dL   Comment 1 Notify RN    CBC     Status: Abnormal   Collection Time    09/28/13  8:40 AM      Result Value Ref Range   WBC 14.5 (*) 4.0 - 10.5 K/uL   RBC 2.52 (*) 4.22 - 5.81 MIL/uL   Hemoglobin 7.7 (*) 13.0 - 17.0 g/dL   HCT 24.0 (*) 39.0 - 52.0 %   MCV 95.2  78.0 - 100.0 fL   MCH 30.6  26.0 - 34.0 pg   MCHC 32.1  30.0 - 36.0 g/dL   RDW 16.5 (*) 11.5 - 15.5 %   Platelets 234  150 - 400 K/uL  COMPREHENSIVE METABOLIC PANEL     Status: Abnormal   Collection Time    09/28/13  8:40 AM      Result Value Ref Range   Sodium 137  137 - 147 mEq/L   Potassium 4.2  3.7 - 5.3 mEq/L   Chloride 104  96 - 112 mEq/L   CO2 20  19 - 32 mEq/L   Glucose, Bld 163 (*) 70 - 99 mg/dL   BUN 13  6 - 23 mg/dL   Creatinine, Ser 0.64  0.50 - 1.35 mg/dL   Calcium 8.0 (*) 8.4 - 10.5 mg/dL   Total Protein 7.4  6.0 - 8.3 g/dL   Albumin 1.8 (*) 3.5 - 5.2 g/dL   AST 43 (*) 0 - 37 U/L   ALT 29  0 - 53 U/L   Alkaline Phosphatase 941 (*) 39 - 117 U/L   Total Bilirubin 0.5  0.3 - 1.2 mg/dL   GFR calc non Af Amer >90  >90 mL/min   GFR calc Af Amer >90  >90 mL/min   Comment: (NOTE)     The eGFR has been calculated using the CKD EPI equation.     This calculation has not been validated in all clinical situations.     eGFR's persistently <90 mL/min signify possible Chronic Kidney     Disease.   Anion gap 13  5 - 15  GLUCOSE, CAPILLARY     Status: Abnormal  Collection Time    09/28/13 11:27 AM      Result Value Ref Range   Glucose-Capillary 224 (*) 70 - 99 mg/dL   Comment 1 Notify RN    GLUCOSE, CAPILLARY     Status: Abnormal   Collection Time    09/28/13  4:26 PM      Result Value Ref Range   Glucose-Capillary 151 (*) 70 - 99 mg/dL   Comment 1 Notify RN    GLUCOSE, CAPILLARY     Status: Abnormal   Collection Time    09/28/13  8:56 PM      Result Value Ref Range   Glucose-Capillary 138 (*) 70 - 99 mg/dL  GLUCOSE, CAPILLARY     Status: Abnormal   Collection Time    09/29/13  6:46 AM      Result Value Ref Range   Glucose-Capillary 125 (*) 70 - 99 mg/dL  GLUCOSE, CAPILLARY     Status: Abnormal   Collection Time    09/29/13 12:02 PM      Result Value Ref Range   Glucose-Capillary 139 (*) 70 - 99 mg/dL  GLUCOSE, CAPILLARY     Status: Abnormal   Collection Time    09/29/13  5:01 PM      Result Value Ref Range   Glucose-Capillary 189 (*) 70 - 99 mg/dL   Comment 1 Notify RN    GLUCOSE, CAPILLARY     Status: Abnormal   Collection Time    09/29/13  8:43 PM      Result Value Ref Range   Glucose-Capillary 190 (*) 70 - 99 mg/dL  GLUCOSE, CAPILLARY     Status: Abnormal   Collection Time    09/30/13  7:15 AM      Result Value Ref Range   Glucose-Capillary 135 (*) 70 - 99 mg/dL     HEENT: normal Cardio: irregular and no murmurs Resp: CTA B/L and unlabored GI: BS positive and large bilateral inguinal mass,  nontender Extremity:  No Edema  Neuro: alert, improved insight and awareness , Abnormal Motor 4/5 in BUE,  3+/5 in BLE, Abnormal FMC Ataxic/ dec FMC, Dysarthric and Apraxic. More alert Musc/Skel:  Mild pain with palpation over lower lumbar spine particularly on the left Gen NAD Skin:  Heels red  Assessment/Plan: 1. Functional deficits secondary to bilateral cerebral infarct embolic which require 3+ hours per day of interdisciplinary therapy in a comprehensive inpatient rehab setting. Physiatrist is providing close team supervision and 24 hour management of active medical problems listed below. Physiatrist and rehab team continue to assess barriers to discharge/monitor patient progress toward functional and medical goals.  FIM: FIM - Bathing Bathing Steps Patient Completed: Chest;Right Arm;Left Arm;Abdomen;Right upper leg;Left upper leg Bathing: 3: Mod-Patient completes 5-7 52f10 parts or 50-74%  FIM - Upper Body Dressing/Undressing Upper body dressing/undressing steps patient completed: Put head through opening of pull over shirt/dress;Thread/unthread right sleeve of pullover shirt/dresss;Thread/unthread left sleeve of pullover shirt/dress;Pull shirt over trunk Upper body dressing/undressing: 5: Supervision: Safety issues/verbal cues FIM - Lower Body Dressing/Undressing Lower body dressing/undressing steps patient completed: Thread/unthread right pants leg;Thread/unthread left pants leg;Pull pants up/down (wears diaper) Lower body dressing/undressing: 3: Mod-Patient completed 50-74% of tasks  FIM - Toileting Toileting: 0: Activity did not occur  FIM - TRadio producerDevices: Bedside commode;Grab bars Toilet Transfers: 3-From toilet/BSC: Mod A (lift or lower assist);3-To toilet/BSC: Mod A (lift or lower assist)  FIM - Bed/Chair Transfer Bed/Chair Transfer Assistive Devices: Arm rests;Bed rails  Bed/Chair Transfer: 5: Supine > Sit: Supervision (verbal  cues/safety issues);5: Sit > Supine: Supervision (verbal cues/safety issues);3: Bed > Chair or W/C: Mod A (lift or lower assist);3: Chair or W/C > Bed: Mod A (lift or lower assist)  FIM - Locomotion: Wheelchair Distance: 120 Locomotion: Wheelchair: 2: Travels 50 - 149 ft with supervision, cueing or coaxing FIM - Locomotion: Ambulation Locomotion: Ambulation Assistive Devices: Nutritional therapist Ambulation/Gait Assistance: 1: +2 Total assist;3: Mod assist Locomotion: Ambulation: 1: Two helpers (30 ft, 20 ft)  Comprehension Comprehension Mode: Auditory Comprehension: 4-Understands basic 75 - 89% of the time/requires cueing 10 - 24% of the time  Expression Expression Mode: Verbal Expression: 5-Expresses basic 90% of the time/requires cueing < 10% of the time.  Social Interaction Social Interaction: 4-Interacts appropriately 75 - 89% of the time - Needs redirection for appropriate language or to initiate interaction.  Problem Solving Problem Solving: 4-Solves basic 75 - 89% of the time/requires cueing 10 - 24% of the time  Memory Memory: 3-Recognizes or recalls 50 - 74% of the time/requires cueing 25 - 49% of the time  Medical Problem List and Plan:  1. Functional deficits secondary to acute cerebral white matter bilateral infarct  2. DVT Prophylaxis/Anticoagulation:off Eliquis per GI due to liver failure, change to ASA until ok to resume Eliquis. Off lovenox due to bleeding 3. Pain Management: Tylenol as needed. lidoderm patches helpful for low back. 4. Dysphagia with decreased nutritional storage. Dysphagia 1 nectar liquids.   -appetite showing some signs of improvement but needs to improve  -add megace today 5. Neuropsych: This patient is not capable of making decisions on his own behalf.  6. Hypertension/atrial fibrillation. Norvasc 10 mg daily, hydralazine 10 mg 4 times a day, metoprolol 25 mg every 8 hours.   7 ID/methicillin sensitive staph aureus. Continue cefazolin 2 g  intravenously every 8 hours x4 weeks total initiated 08/29/2013.   8. Diabetes mellitus with peripheral neuropathy.     -increased Levemir to 25 units each bedtime.   -sugars much improved although po intake is inadequate 9. Integumentary: local skin care, turning, maintain foley for now, mattress overlay, WOC, Diflucan for yeast PRAFOs for heels 10.  Inguinal mass, bilateral hernia, obviously chronic and not incarcerated, given recent CVA, no elective surg recommended at this time 11.  Elevated alk phos: off Lipitor,  ? lipitor vs autoimmune with increased IgG and IgA, liver ultrasound OK, appreciate close GI f/u  -AP and other LFT's continue to trend down  12.  Epistaxis: resolved 13. Anemia: recent iron tests indicate anemia of chronic disease.    -hgb 7.7 most recently. WBC's elevated but consistent with previous draws  -recheck tomorrow    LOS (Days) 19 A FACE TO FACE EVALUATION WAS PERFORMED  Shearon Clonch T 09/30/2013, 7:48 AM

## 2013-10-01 ENCOUNTER — Encounter (HOSPITAL_COMMUNITY): Payer: Medicare Other

## 2013-10-01 ENCOUNTER — Inpatient Hospital Stay (HOSPITAL_COMMUNITY): Payer: Medicare Other | Admitting: Physical Therapy

## 2013-10-01 ENCOUNTER — Inpatient Hospital Stay (HOSPITAL_COMMUNITY): Payer: Medicare Other | Admitting: Speech Pathology

## 2013-10-01 LAB — CBC
HCT: 26.4 % — ABNORMAL LOW (ref 39.0–52.0)
Hemoglobin: 8.6 g/dL — ABNORMAL LOW (ref 13.0–17.0)
MCH: 31.4 pg (ref 26.0–34.0)
MCHC: 32.6 g/dL (ref 30.0–36.0)
MCV: 96.4 fL (ref 78.0–100.0)
Platelets: 269 10*3/uL (ref 150–400)
RBC: 2.74 MIL/uL — ABNORMAL LOW (ref 4.22–5.81)
RDW: 16.2 % — ABNORMAL HIGH (ref 11.5–15.5)
WBC: 11.4 10*3/uL — ABNORMAL HIGH (ref 4.0–10.5)

## 2013-10-01 LAB — GLUCOSE, CAPILLARY
Glucose-Capillary: 112 mg/dL — ABNORMAL HIGH (ref 70–99)
Glucose-Capillary: 193 mg/dL — ABNORMAL HIGH (ref 70–99)
Glucose-Capillary: 257 mg/dL — ABNORMAL HIGH (ref 70–99)
Glucose-Capillary: 128 mg/dL — ABNORMAL HIGH (ref 70–99)

## 2013-10-01 NOTE — Progress Notes (Signed)
Subjective/Complaints: No complaints. Pain under better control. Still not eating much Review of Systems - limited secondary to mental status  Objective: Vital Signs: Blood pressure 112/68, pulse 84, temperature 98.7 F (37.1 C), temperature source Oral, resp. rate 18, weight 85.594 kg (188 lb 11.2 oz), SpO2 99.00%. No results found. Results for orders placed during the hospital encounter of 09/11/13 (from the past 72 hour(s))  GLUCOSE, CAPILLARY     Status: Abnormal   Collection Time    09/28/13  7:01 AM      Result Value Ref Range   Glucose-Capillary 112 (*) 70 - 99 mg/dL   Comment 1 Notify RN    CBC     Status: Abnormal   Collection Time    09/28/13  8:40 AM      Result Value Ref Range   WBC 14.5 (*) 4.0 - 10.5 K/uL   RBC 2.52 (*) 4.22 - 5.81 MIL/uL   Hemoglobin 7.7 (*) 13.0 - 17.0 g/dL   HCT 24.0 (*) 39.0 - 52.0 %   MCV 95.2  78.0 - 100.0 fL   MCH 30.6  26.0 - 34.0 pg   MCHC 32.1  30.0 - 36.0 g/dL   RDW 16.5 (*) 11.5 - 15.5 %   Platelets 234  150 - 400 K/uL  COMPREHENSIVE METABOLIC PANEL     Status: Abnormal   Collection Time    09/28/13  8:40 AM      Result Value Ref Range   Sodium 137  137 - 147 mEq/L   Potassium 4.2  3.7 - 5.3 mEq/L   Chloride 104  96 - 112 mEq/L   CO2 20  19 - 32 mEq/L   Glucose, Bld 163 (*) 70 - 99 mg/dL   BUN 13  6 - 23 mg/dL   Creatinine, Ser 0.64  0.50 - 1.35 mg/dL   Calcium 8.0 (*) 8.4 - 10.5 mg/dL   Total Protein 7.4  6.0 - 8.3 g/dL   Albumin 1.8 (*) 3.5 - 5.2 g/dL   AST 43 (*) 0 - 37 U/L   ALT 29  0 - 53 U/L   Alkaline Phosphatase 941 (*) 39 - 117 U/L   Total Bilirubin 0.5  0.3 - 1.2 mg/dL   GFR calc non Af Amer >90  >90 mL/min   GFR calc Af Amer >90  >90 mL/min   Comment: (NOTE)     The eGFR has been calculated using the CKD EPI equation.     This calculation has not been validated in all clinical situations.     eGFR's persistently <90 mL/min signify possible Chronic Kidney     Disease.   Anion gap 13  5 - 15  GLUCOSE, CAPILLARY      Status: Abnormal   Collection Time    09/28/13 11:27 AM      Result Value Ref Range   Glucose-Capillary 224 (*) 70 - 99 mg/dL   Comment 1 Notify RN    GLUCOSE, CAPILLARY     Status: Abnormal   Collection Time    09/28/13  4:26 PM      Result Value Ref Range   Glucose-Capillary 151 (*) 70 - 99 mg/dL   Comment 1 Notify RN    GLUCOSE, CAPILLARY     Status: Abnormal   Collection Time    09/28/13  8:56 PM      Result Value Ref Range   Glucose-Capillary 138 (*) 70 - 99 mg/dL  GLUCOSE, CAPILLARY     Status:  Abnormal   Collection Time    09/29/13  6:46 AM      Result Value Ref Range   Glucose-Capillary 125 (*) 70 - 99 mg/dL  GLUCOSE, CAPILLARY     Status: Abnormal   Collection Time    09/29/13 12:02 PM      Result Value Ref Range   Glucose-Capillary 139 (*) 70 - 99 mg/dL  GLUCOSE, CAPILLARY     Status: Abnormal   Collection Time    09/29/13  5:01 PM      Result Value Ref Range   Glucose-Capillary 189 (*) 70 - 99 mg/dL   Comment 1 Notify RN    GLUCOSE, CAPILLARY     Status: Abnormal   Collection Time    09/29/13  8:43 PM      Result Value Ref Range   Glucose-Capillary 190 (*) 70 - 99 mg/dL  GLUCOSE, CAPILLARY     Status: Abnormal   Collection Time    09/30/13  7:15 AM      Result Value Ref Range   Glucose-Capillary 135 (*) 70 - 99 mg/dL  GLUCOSE, CAPILLARY     Status: Abnormal   Collection Time    09/30/13 12:01 PM      Result Value Ref Range   Glucose-Capillary 186 (*) 70 - 99 mg/dL  GLUCOSE, CAPILLARY     Status: Abnormal   Collection Time    09/30/13  4:34 PM      Result Value Ref Range   Glucose-Capillary 176 (*) 70 - 99 mg/dL  GLUCOSE, CAPILLARY     Status: Abnormal   Collection Time    09/30/13  8:40 PM      Result Value Ref Range   Glucose-Capillary 190 (*) 70 - 99 mg/dL     HEENT: normal Cardio: irregular and no murmurs Resp: CTA B/L and unlabored GI: BS positive and large bilateral inguinal mass, nontender Extremity:  No Edema  Neuro: alert,  improved insight and awareness , Abnormal Motor 4/5 in BUE,  3+/5 in BLE, Abnormal FMC Ataxic/ dec FMC, Dysarthric and Apraxic. More alert Musc/Skel:  Mild pain with palpation over lower lumbar spine particularly on the left Gen NAD Skin:  Heels red  Assessment/Plan: 1. Functional deficits secondary to bilateral cerebral infarct embolic which require 3+ hours per day of interdisciplinary therapy in a comprehensive inpatient rehab setting. Physiatrist is providing close team supervision and 24 hour management of active medical problems listed below. Physiatrist and rehab team continue to assess barriers to discharge/monitor patient progress toward functional and medical goals.  FIM: FIM - Bathing Bathing Steps Patient Completed: Chest;Right Arm;Left Arm;Abdomen Bathing: 2: Max-Patient completes 3-4 23f10 parts or 25-49%  FIM - Upper Body Dressing/Undressing Upper body dressing/undressing steps patient completed: Thread/unthread right sleeve of pullover shirt/dresss;Put head through opening of pull over shirt/dress;Thread/unthread left sleeve of pullover shirt/dress;Pull shirt over trunk Upper body dressing/undressing: 5: Supervision: Safety issues/verbal cues FIM - Lower Body Dressing/Undressing Lower body dressing/undressing steps patient completed: Pull underwear up/down Lower body dressing/undressing: 2: Max-Patient completed 25-49% of tasks  FIM - Toileting Toileting: 0: Activity did not occur  FIM - TRadio producerDevices: Bedside commode;Grab bars Toilet Transfers: 0-Activity did not occur  FIM - BControl and instrumentation engineerDevices: Arm rests;Bed rails Bed/Chair Transfer: 0: Activity did not occur  FIM - Locomotion: Wheelchair Distance: 120 Locomotion: Wheelchair: 2: Travels 50 - 149 ft with supervision, cueing or coaxing FIM - Locomotion: Ambulation Locomotion: Ambulation Assistive Devices: WEnvironmental consultant-  Harmon Pier Ambulation/Gait  Assistance: 1: +2 Total assist;3: Mod assist Locomotion: Ambulation: 1: Two helpers (30 ft, 20 ft)  Comprehension Comprehension Mode: Auditory Comprehension: 4-Understands basic 75 - 89% of the time/requires cueing 10 - 24% of the time  Expression Expression Mode: Verbal Expression: 5-Expresses basic 90% of the time/requires cueing < 10% of the time.  Social Interaction Social Interaction: 4-Interacts appropriately 75 - 89% of the time - Needs redirection for appropriate language or to initiate interaction.  Problem Solving Problem Solving: 4-Solves basic 75 - 89% of the time/requires cueing 10 - 24% of the time  Memory Memory: 3-Recognizes or recalls 50 - 74% of the time/requires cueing 25 - 49% of the time  Medical Problem List and Plan:  1. Functional deficits secondary to acute cerebral white matter bilateral infarct  2. DVT Prophylaxis/Anticoagulation:off Eliquis per GI due to liver failure, change to ASA until ok to resume Eliquis. Off lovenox due to bleeding 3. Pain Management: Tylenol as needed. lidoderm patches helpful for low back. 4. Dysphagia with decreased nutritional storage. Dysphagia 1 nectar liquids.   -appetite showing some signs of improvement pt blames losing teeth for poor appetite  -add megace today 5. Neuropsych: This patient is not capable of making decisions on his own behalf.  6. Hypertension/atrial fibrillation. Norvasc 10 mg daily, hydralazine 10 mg 4 times a day, metoprolol 25 mg every 8 hours.   7 ID/methicillin sensitive staph aureus. Continue cefazolin 2 g intravenously every 8 hours x4 weeks total initiated 08/29/2013.   8. Diabetes mellitus with peripheral neuropathy.     -increased Levemir to 25 units each bedtime.   -sugars much improved although po intake is inadequate 9. Integumentary: local skin care, turning, maintain foley for now, mattress overlay, WOC, Diflucan for yeast PRAFOs for heels 10.  Inguinal mass, bilateral hernia, obviously  chronic and not incarcerated, given recent CVA, no elective surg recommended at this time 11.  Elevated alk phos: off Lipitor,  ? lipitor vs autoimmune with increased IgG and IgA, liver ultrasound OK, appreciate close GI f/u  -AP and other LFT's continue to trend down  12.  Epistaxis: resolved 13. Anemia: recent iron tests indicate anemia of chronic disease.    -hgb 7.7 monitor for symptoms,      LOS (Days) 20 A FACE TO FACE EVALUATION WAS PERFORMED  Joclynn Lumb E 10/01/2013, 6:47 AM

## 2013-10-01 NOTE — Progress Notes (Signed)
Occupational Therapy Session Note  Patient Details  Name: Elijah CardJames A Haltiwanger MRN: [id number] Date of Birth: 02/12/1934  Today's Date: 10/01/2013 Time: [id number] Time Calculation (min): 60 min  Short Term Goals: Week 3:  OT Short Term Goal 1 (Week 3): Pt will transfer on and off toilet with min A using grab bars and squat or stand pivot transfer. OT Short Term Goal 2 (Week 3): Pt will stand up from w/c with steady A only to manage clothing over hips. OT Short Term Goal 3 (Week 3): Pt will be able to stand to wash buttocks and perineal area with steady A. OT Short Term Goal 4 (Week 3): Pt will be able to don pants over feet using a reacher with min A.  Skilled Therapeutic Interventions/Progress Updates:    Pt seated in w/c upon arrival.  Pt initially requested to return to bed but agreed to UB bathing and dressing in w/c before returning to bed to complete LB bathing and dressing at bed level.  Pt stated that he had been up in his w/c for "too long" and his back was "killing" him.  RN notified and meds administered during session.  Pt required assistance with LB bathing but was able to thread pants and pull up pants by rolling from side to side.  Pt was able to reposition himself in bed without assistance.  Focus on activity tolerance, transfers, bed mobility, safety awareness, sit<>stand, and standing balance.   Therapy Documentation Precautions:  Precautions Precautions: Fall Precaution Comments:  pain low back limits volitional movement Restrictions Weight Bearing Restrictions: No Pain: Pain Assessment Pain Assessment: 0-10 Pain Score: 9  Pain Type: Acute pain Pain Location: Back Pain Orientation: Lower Pain Descriptors / Indicators: Aching Pain Onset: Gradual Patients Stated Pain Goal: 2 Pain Intervention(s): RN aware; repositioned; Heat applied ADL: ADL ADL Comments: refer to FIM  See FIM for current functional status  Therapy/Group: Individual Therapy  Rich BraveLanier, Vernessa Likes  Chappell 10/01/2013, 11:40 AM

## 2013-10-01 NOTE — Progress Notes (Signed)
Speech Language Pathology Daily Session Note  Patient Details  Name: Elijah [name] MRN: [id number] Date of Birth: 07/03/1933  Today's Date: 10/01/2013 Time: [id number] Time Calculation (min): 57 min  Short Term Goals: Week 3: SLP Short Term Goal 1 (Week 3): Pt will demonstrate effectient mastication of Dys 2 textures with Min verbal cues SLP Short Term Goal 2 (Week 3): Pt will utilize speech intelligibility strategies at the phrase level with Min cues SLP Short Term Goal 3 (Week 3): Pt will consistently sustain attention to basic familiar task for 10 minutes with Mod cues SLP Short Term Goal 4 (Week 3): Pt will request help as needed with Mod question cues  SLP Short Term Goal 5 (Week 3): Pt will demonstrate basic problem solving during basic, familiar task with Min cues SLP Short Term Goal 6 (Week 3): Pt will utilize external memory aids to recall new and daily information with Mod cues  Skilled Therapeutic Interventions: Skilled treatment session focused on addressing dysphagia and cognitive goals. Patient required Min multimodal cues for orientation and Mod cues to utilize schedule times for basic calculations.  Patient reported needing to go to the bathroom and required Mod verbal cues to safely sequence task.  Patient completed oral care with Supervision cues for thoroughness and consumed water via cup with no overt s/s of aspiration.  Recommend initiation of the water protocol.     FIM:  Comprehension Comprehension Mode: Auditory Comprehension: 4-Understands basic 75 - 89% of the time/requires cueing 10 - 24% of the time Expression Expression Mode: Verbal Expression: 5-Expresses basic 90% of the time/requires cueing < 10% of the time. Social Interaction Social Interaction: 4-Interacts appropriately 75 - 89% of the time - Needs redirection for appropriate language or to initiate interaction. Problem Solving Problem Solving: 4-Solves basic 75 - 89% of the time/requires cueing 10 -  24% of the time Memory Memory: 3-Recognizes or recalls 50 - 74% of the time/requires cueing 25 - 49% of the time FIM - Eating Eating Activity: 5: Supervision/cues  Pain Pain Assessment Pain Assessment: No/denies pain  Therapy/Group: Individual Therapy  Charlane FerrettiMelissa Amier Hoyt, M.A., CCC-SLP [phone]  Anely Spiewak 10/01/2013, 4:34 PM

## 2013-10-01 NOTE — Progress Notes (Signed)
Physical Therapy Session Note  Patient Details  Name: Elijah [name] MRN: [id number] Date of Birth: 11-05-1933  Today's Date: 10/01/2013 Time: 8177-1165 Time Calculation (min): 60 min  Short Term Goals: Week 2:  PT Short Term Goal 1 (Week 2): pt will move supine > sit with min assist PT Short Term Goal 1 - Progress (Week 2): Progressing toward goal PT Short Term Goal 2 (Week 2): pt will transfer with assist of 1 person, > 50% of attempts PT Short Term Goal 2 - Progress (Week 2): Met PT Short Term Goal 3 (Week 2): pt will propel w/c (regular) x 25' with mod assist,  PT Short Term Goal 3 - Progress (Week 2): Met PT Short Term Goal 4 (Week 2): pt will perform gait x 10' with assist of 2 people, 4 out of 5 days PT Short Term Goal 4 - Progress (Week 2): Partly met  Skilled Therapeutic Interventions/Progress Updates:    2:1. Pt received semi-reclined in bed. Agreeable to therapy with min coaxing. Pt participation during this session limited by pain in lower back; RN notified and present to address pain. Session focused on increasing activity tolerance, improving stability/independence with functional mobility. Supine<>sit with supervision using bed rail; HOB flat. Squat pivot transfer from bed<>w/c with min A, verbal cueing for safe hand placement, setup. Pt performed w/c mobility x150' in controlled environment with bilat UE's and supervision, mod encouragement. Pt managed w/c brakes with mod cueing, managed w/c leg rests with max A, HOH assist on bilat sides.  In treatment gym, performed multiple sit<>stand transfers from EOM (neutral to raised height, per pt tolerance, fatigue) with min A, multimodal cueing for hand placement. Static standing x4 trials (x5-10 seconds per trial) with bilat UE support at rail. Attempted gait with RUE support at hallway hand rail with minimal success secondary to limited activity tolerance, pt tendency to reposition self into w/c after 2-3 steps. Therefore,  transitioned to gait x15' in controlled environment with +2A, 3 musketeers assist for upright posture, increased tolerance to standing/gait. Session ended in pt room, where pt was left supine in air fluidized bed with 4 bed rails up, bed alarm on, and all needs within reach.  Therapy Documentation Precautions:  Precautions Precautions: Fall Precaution Comments:  pain low back limits volitional movement Restrictions Weight Bearing Restrictions: No Vital Signs: Therapy Vitals BP: 119/66 mmHg Pain: Pain Assessment Pain Assessment: No/denies pain Pain Score: 0-No pain Pain Type: Chronic pain Pain Location: Back Pain Orientation: Lower Pain Descriptors / Indicators: Aching Pain Onset: On-going Pain Intervention(s): RN made aware;Heat applied;Rest Multiple Pain Sites: No Locomotion : Ambulation Ambulation/Gait Assistance: 1: +2 Total assist Wheelchair Mobility Distance: 150   See FIM for current functional status  Therapy/Group: Individual Therapy  Stefano Gaul 10/01/2013, 6:17 PM

## 2013-10-02 ENCOUNTER — Inpatient Hospital Stay (HOSPITAL_COMMUNITY): Payer: Medicare Other | Admitting: *Deleted

## 2013-10-02 ENCOUNTER — Inpatient Hospital Stay (HOSPITAL_COMMUNITY): Payer: Medicare Other | Admitting: Speech Pathology

## 2013-10-02 ENCOUNTER — Inpatient Hospital Stay (HOSPITAL_COMMUNITY): Payer: Medicare Other | Admitting: Occupational Therapy

## 2013-10-02 DIAGNOSIS — I6340 Cerebral infarction due to embolism of unspecified cerebral artery: Secondary | ICD-10-CM

## 2013-10-02 LAB — GLUCOSE, CAPILLARY
Glucose-Capillary: 89 mg/dL (ref 70–99)
Glucose-Capillary: 153 mg/dL — ABNORMAL HIGH (ref 70–99)
Glucose-Capillary: 149 mg/dL — ABNORMAL HIGH (ref 70–99)
Glucose-Capillary: 123 mg/dL — ABNORMAL HIGH (ref 70–99)

## 2013-10-02 MED ORDER — TRAMADOL HCL 50 MG PO TABS
50.0000 mg | ORAL_TABLET | Freq: Two times a day (BID) | ORAL | Status: DC | PRN
  Administered 2013-10-02 – 2013-10-03 (×3): 50 mg via ORAL
  Filled 2013-10-02 (×4): qty 1

## 2013-10-02 MED ORDER — HYDROCERIN EX CREA
TOPICAL_CREAM | Freq: Two times a day (BID) | CUTANEOUS | Status: DC
  Administered 2013-10-02 – 2013-10-09 (×14): via TOPICAL
  Administered 2013-10-10: 1 via TOPICAL
  Filled 2013-10-02: qty 113

## 2013-10-02 NOTE — Plan of Care (Signed)
Problem: RH BOWEL ELIMINATION Goal: RH STG MANAGE BOWEL WITH ASSISTANCE STG Manage Bowel with max Assistance.  Outcome: Progressing Pt still experiencing occasional incontinent episodes

## 2013-10-02 NOTE — Progress Notes (Signed)
NUTRITION FOLLOW UP  Intervention:   -Recommend downgrade diet to Dysphagia 1, nectar thick liquid due to difficulty chewing/no dentures. Spoke with SLP regarding this, will be discussed later today.  -Continue Ensure Pudding TID, each supplement provides 170 kcal and 4 grams of protein, recommend providing this with medications. -Continue Prostat BID, each provides 100 kcal, 15 g protein. Recommend mixing with thickened liquids or mixing with Ensure Pudding to improve flavor.  Nutrition Dx:   Inadequate oral intake related to CVA as evidenced by 10% meal intake, ongoing   Goal:  Patient will meet >/=90% of estimated nutrition needs, progressing   Monitor:  PO & supplemental intake, weight, labs, I/O's  Assessment:   80 year old right-handed Caucasian male with history of diabetes mellitus with peripheral neuropathy. Patient independent and active prior to admission. Admitted to [facility] 08/27/2013 with slurred speech as well as diffuse weakness.   Diet advanced to Dysphagia 2, nectar-thick liquid diet. Intake remains fair with 0-65% intake. Patient reports that he is hungry, but has difficulty chewing foods due to lack of teeth/missing dentures. After discussing with RN, it seems that it is this rather than decreased appetite that is causing the poor intake.   Spoke with SLP regarding downgrading diet to promote PO intake. This will be discussed later today.   Patient is eating some of Ensure Pudding supplements when given with mediations.   He was started on megace 7/12, but is not taking due to not liking how it tastes.   Height: Ht Readings from Last 1 Encounters:  09/11/13 6\' 2"  (1.88 m)    Weight Status:   Wt Readings from Last 1 Encounters:  09/12/13 188 lb 11.2 oz (85.594 kg)    Re-estimated needs:  Kcal: 2050-2200 kcal Protein: 110-125 g Fluid: >2.7 L/day  Skin: excoriated area on buttocks/sacrum, deep tissue injury right heel  Diet Order:  Dysphagia 2, nectar-thick   Intake/Output Summary (Last 24 hours) at 10/02/13 1340 Last data filed at 10/02/13 1100  Gross per 24 hour  Intake    445 ml  Output   1000 ml  Net   -555 ml    Last BM: 7/9   Labs:   Recent Labs Lab 09/28/13 0840  NA 137  K 4.2  CL 104  CO2 20  BUN 13  CREATININE 0.64  CALCIUM 8.0*  GLUCOSE 163*    CBG (last 3)   Recent Labs  10/01/13 2104 10/02/13 0720 10/02/13 1201  GLUCAP 128* 89 153*    Scheduled Meds: . amLODipine  10 mg Oral Daily  . antiseptic oral rinse  15 mL Mouth Rinse q12n4p  . aspirin  81 mg Oral Daily  . chlorhexidine  15 mL Mouth Rinse BID  . collagenase   Topical Daily  . feeding supplement (ENSURE)  1 Container Oral TID BM  . feeding supplement (PRO-STAT SUGAR FREE 64)  30 mL Oral BID WC  . hydrALAZINE  10 mg Oral 4 times per day  . insulin aspart  0-15 Units Subcutaneous TID WC  . insulin aspart  0-5 Units Subcutaneous QHS  . insulin detemir  25 Units Subcutaneous QHS  . lidocaine  2 patch Transdermal Q24H  . megestrol  400 mg Oral BID  . metoprolol tartrate  25 mg Oral 3 times per day    Continuous Infusions: . 0.45 % NaCl with KCl 20 mEq / L 75 mL/hr at 10/02/13 0817    Linnell FullingElyse Eldred Sooy, RD, LDN Pager #: [phone] After-Hours Pager #:  [phone]  

## 2013-10-02 NOTE — Progress Notes (Signed)
Physical Therapy Session Note  Patient Details  Name: Elijah CardJames A Xu MRN: [id number] Date of Birth: 12/28/1933  Today's Date: 10/02/2013 Time: 9:01-10:01 (60min)   Skilled Therapeutic Interventions/Progress Updates:  Tx focused on functional mobility, gait with RW, stairs, and therex for stretching and back pain reduction. BP somewhat low in sitting, but improved during tx. Pt needed theraputic rests after each task, education provided on mobility and pain. Pt needed encouragement to participate due to new and challenging tasks accomplished today.   Pt continues to be limted by low back pain. RN administered meds during tx, requested pt to be pre-medicated.  Provided education on importance of increasing mobility despite pain for pain reduction as well as increased function. Pt in agreement to attempt as much therapy as possible.   Pt resting on mat, performed lower trunk rotations for back pain relief in pain-free range. Preformed extensive HS, DF, hip rotators, and gentle low back rotation stretches in supine and sitting.   Supine >sit with min A for trunk lifting.   Sit<>stands with min/mod A due to sitting before safely ready.   Gait 2x20' and 1x10' with RW and up to Mod A for steadying with cues for increasing UE support as well as posture and step width.   Static standing 2x462min with RW and min steadying assist.   Performed stairs x5 with bil rails, step-to pattern and up to Mod A for steadying.   Pt wanting to return to bed, but agreeable to trying recliner instead, supported with pillows, lap belt on and all needs in reach.        Therapy Documentation Precautions:  Precautions Precautions: Fall Precaution Comments:  pain low back limits volitional movement Restrictions Weight Bearing Restrictions: No    Vital Signs: Therapy Vitals Pulse Rate: 97 BP: 112/75 mmHg Patient Position (if appropriate): Sitting Pain: Pain Assessment Pain Assessment: 0-10 Pain Score:  8  Pain Type: Chronic pain Pain Location: Back Pain Orientation: Lower Pain Descriptors / Indicators: Aching Pain Intervention(s): Medication (See eMAR)    Locomotion : Ambulation Ambulation/Gait Assistance: 3: Mod assist   See FIM for current functional status  Therapy/Group: Individual Therapy Clydene Lamingole Anjolie Majer, PT, DPT  10/02/2013, 9:28 AM

## 2013-10-02 NOTE — Progress Notes (Signed)
Subjective/Complaints: Low back pain during PT, OT not in bed No radiating pain to legs, no leg numbness Has Kpad and Lidoderm Review of Systems - limited secondary to mental status  Objective: Vital Signs: Blood pressure 129/80, pulse 87, temperature 98.7 F (37.1 C), temperature source Oral, resp. rate 18, weight 85.594 kg (188 lb 11.2 oz), SpO2 99.00%. No results found. Results for orders placed during the hospital encounter of 09/11/13 (from the past 72 hour(s))  GLUCOSE, CAPILLARY     Status: Abnormal   Collection Time    09/29/13 12:02 PM      Result Value Ref Range   Glucose-Capillary 139 (*) 70 - 99 mg/dL  GLUCOSE, CAPILLARY     Status: Abnormal   Collection Time    09/29/13  5:01 PM      Result Value Ref Range   Glucose-Capillary 189 (*) 70 - 99 mg/dL   Comment 1 Notify RN    GLUCOSE, CAPILLARY     Status: Abnormal   Collection Time    09/29/13  8:43 PM      Result Value Ref Range   Glucose-Capillary 190 (*) 70 - 99 mg/dL  GLUCOSE, CAPILLARY     Status: Abnormal   Collection Time    09/30/13  7:15 AM      Result Value Ref Range   Glucose-Capillary 135 (*) 70 - 99 mg/dL  GLUCOSE, CAPILLARY     Status: Abnormal   Collection Time    09/30/13 12:01 PM      Result Value Ref Range   Glucose-Capillary 186 (*) 70 - 99 mg/dL  GLUCOSE, CAPILLARY     Status: Abnormal   Collection Time    09/30/13  4:34 PM      Result Value Ref Range   Glucose-Capillary 176 (*) 70 - 99 mg/dL  GLUCOSE, CAPILLARY     Status: Abnormal   Collection Time    09/30/13  8:40 PM      Result Value Ref Range   Glucose-Capillary 190 (*) 70 - 99 mg/dL  CBC     Status: Abnormal   Collection Time    10/01/13  6:52 AM      Result Value Ref Range   WBC 11.4 (*) 4.0 - 10.5 K/uL   RBC 2.74 (*) 4.22 - 5.81 MIL/uL   Hemoglobin 8.6 (*) 13.0 - 17.0 g/dL   HCT 26.4 (*) 39.0 - 52.0 %   MCV 96.4  78.0 - 100.0 fL   MCH 31.4  26.0 - 34.0 pg   MCHC 32.6  30.0 - 36.0 g/dL   RDW 16.2 (*) 11.5 - 15.5 %    Platelets 269  150 - 400 K/uL  GLUCOSE, CAPILLARY     Status: Abnormal   Collection Time    10/01/13  7:28 AM      Result Value Ref Range   Glucose-Capillary 112 (*) 70 - 99 mg/dL  GLUCOSE, CAPILLARY     Status: Abnormal   Collection Time    10/01/13 11:27 AM      Result Value Ref Range   Glucose-Capillary 193 (*) 70 - 99 mg/dL  GLUCOSE, CAPILLARY     Status: Abnormal   Collection Time    10/01/13  4:18 PM      Result Value Ref Range   Glucose-Capillary 257 (*) 70 - 99 mg/dL  GLUCOSE, CAPILLARY     Status: Abnormal   Collection Time    10/01/13  9:04 PM      Result Value  Ref Range   Glucose-Capillary 128 (*) 70 - 99 mg/dL  GLUCOSE, CAPILLARY     Status: None   Collection Time    10/02/13  7:20 AM      Result Value Ref Range   Glucose-Capillary 89  70 - 99 mg/dL     HEENT: normal Cardio: irregular and no murmurs Resp: CTA B/L and unlabored GI: BS positive and large bilateral inguinal mass, nontender Extremity:  No Edema  Neuro: alert, improved insight and awareness , Abnormal Motor 4/5 in BUE,  3+/5 in BLE, Abnormal FMC Ataxic/ dec FMC, Dysarthric and Apraxic. More alert Musc/Skel:  Mild pain with palpation over lower lumbar spine particularly on the left Gen NAD Skin:  Heels red  Assessment/Plan: 1. Functional deficits secondary to bilateral cerebral infarct embolic which require 3+ hours per day of interdisciplinary therapy in a comprehensive inpatient rehab setting. Physiatrist is providing close team supervision and 24 hour management of active medical problems listed below. Physiatrist and rehab team continue to assess barriers to discharge/monitor patient progress toward functional and medical goals.  FIM: FIM - Bathing Bathing Steps Patient Completed: Chest;Right Arm;Left Arm;Abdomen Bathing: 2: Max-Patient completes 3-4 3f10 parts or 25-49%  FIM - Upper Body Dressing/Undressing Upper body dressing/undressing steps patient completed: Thread/unthread right  sleeve of pullover shirt/dresss;Put head through opening of pull over shirt/dress;Thread/unthread left sleeve of pullover shirt/dress;Pull shirt over trunk Upper body dressing/undressing: 5: Supervision: Safety issues/verbal cues FIM - Lower Body Dressing/Undressing Lower body dressing/undressing steps patient completed: Thread/unthread right pants leg;Thread/unthread left pants leg;Pull pants up/down Lower body dressing/undressing: 2: Max-Patient completed 25-49% of tasks  FIM - Toileting Toileting: 0: Activity did not occur  FIM - TRadio producerDevices: Bedside commode;Grab bars Toilet Transfers: 0-Activity did not occur  FIM - BControl and instrumentation engineerDevices: Arm rests;Bed rails Bed/Chair Transfer: 5: Sit > Supine: Supervision (verbal cues/safety issues);4: Chair or W/C > Bed: Min A (steadying Pt. > 75%);4: Bed > Chair or W/C: Min A (steadying Pt. > 75%);5: Supine > Sit: Supervision (verbal cues/safety issues)  FIM - Locomotion: Wheelchair Distance: 150 Locomotion: Wheelchair: 5: Travels 150 ft or more: maneuvers on rugs and over door sills with supervision, cueing or coaxing FIM - Locomotion: Ambulation Locomotion: Ambulation Assistive Devices: Other (comment) (3 musketeers) Ambulation/Gait Assistance: 1: +2 Total assist Locomotion: Ambulation: 1: Two helpers  Comprehension Comprehension Mode: Auditory Comprehension: 4-Understands basic 75 - 89% of the time/requires cueing 10 - 24% of the time  Expression Expression Mode: Verbal Expression: 5-Expresses basic 90% of the time/requires cueing < 10% of the time.  Social Interaction Social Interaction: 4-Interacts appropriately 75 - 89% of the time - Needs redirection for appropriate language or to initiate interaction.  Problem Solving Problem Solving: 4-Solves basic 75 - 89% of the time/requires cueing 10 - 24% of the time  Memory Memory: 3-Recognizes or recalls 50 - 74%  of the time/requires cueing 25 - 49% of the time  Medical Problem List and Plan:  1. Functional deficits secondary to acute cerebral white matter bilateral infarct  2. DVT Prophylaxis/Anticoagulation:off Eliquis per GI due to liver failure, change to ASA until ok to resume Eliquis. Off lovenox due to bleeding 3. Pain Management: Tylenol as needed. lidoderm patches helpful for low back. 4. Dysphagia with decreased nutritional storage. Dysphagia 1 nectar liquids.   -appetite showing some signs of improvement pt blames losing teeth for poor appetite  -add megace today 5. Neuropsych: This patient is not capable of making  decisions on his own behalf.  6. Hypertension/atrial fibrillation. Norvasc 10 mg daily, hydralazine 10 mg 4 times a day, metoprolol 25 mg every 8 hours.   7 ID/methicillin sensitive staph aureus. Continue cefazolin 2 g intravenously every 8 hours x4 weeks total initiated 08/29/2013.   8. Diabetes mellitus with peripheral neuropathy.     -increased Levemir to 25 units each bedtime.   -sugars much improved although po intake is inadequate 9. Integumentary: local skin care, turning, maintain foley for now, mattress overlay, WOC, Diflucan for yeast PRAFOs for heels 10.  Inguinal mass, bilateral hernia, obviously chronic and not incarcerated, given recent CVA, no elective surg recommended at this time 11.  Elevated alk phos: off Lipitor,  ? lipitor vs autoimmune with increased IgG and IgA, liver ultrasound OK, appreciate close GI f/u  -AP and other LFT's continue to trend down  12.  Epistaxis: resolved 13. Anemia: recent iron tests indicate anemia of chronic disease.    -hgb 7.7 monitor for symptoms,  14.  Low back pain , sacral area, likely degenerative with exacerbation secondary to sedentary activity level, cont heat, lidoderm, add tramadol, monitor cognition and LFT    LOS (Days) 21 A FACE TO FACE EVALUATION WAS PERFORMED  Hymen Arnett E 10/02/2013, 8:28 AM

## 2013-10-02 NOTE — Progress Notes (Addendum)
Speech Language Pathology Daily Session Note  Patient Details  Name: Elijah [name] A [place] MRN: [id number] Date of Birth: 06/21/1933  Today's Date: 10/02/2013 Time: [id number] Time Calculation (min): 62 min  Short Term Goals: Week 3: SLP Short Term Goal 1 (Week 3): Pt will demonstrate effectient mastication of Dys 2 textures with Min verbal cues SLP Short Term Goal 2 (Week 3): Pt will utilize speech intelligibility strategies at the phrase level with Min cues SLP Short Term Goal 3 (Week 3): Pt will consistently sustain attention to basic familiar task for 10 minutes with Mod cues SLP Short Term Goal 4 (Week 3): Pt will request help as needed with Mod question cues  SLP Short Term Goal 5 (Week 3): Pt will demonstrate basic problem solving during basic, familiar task with Min cues SLP Short Term Goal 6 (Week 3): Pt will utilize external memory aids to recall new and daily information with Mod cues  Skilled Therapeutic Interventions:  Pt was seen for skilled speech therapy targeting sustained attention and dysphagia management.  Per RN report, pt is having difficulty masticating dys 2 textures secondary to sore gums and missing dentition.  Pt requested to be downgraded to dys 1 textures.  SLP transferred pt to wheelchair with max encouragement and min assist verbal cues for sequencing transfer . Once seated upright, pt completed oral care with supervision verbal cues from SLP for thoroughness secondary to decreased sustained attention in the setting of fatigue.  SLP completed skilled observations with trials of thin liquids via cup sips with pt exhibiting no overt s/s of aspiration.  In addition, SLP facilitated the session with a structured word search task in a moderately distracting environment with min cuing for redirection when pt became distracted by back pain.  Pt benefited from having pre-established time limits for tasks to allow for breaks.  Pt requested to go back to the room as he felt he had had  an incontinent episode.  During clean up, pt require mod cuing for redirection and min cues to sequence movements to facilitate clean up.  Continue per current plan of care.     FIM:  Comprehension Comprehension Mode: Auditory Comprehension: 4-Understands basic 75 - 89% of the time/requires cueing 10 - 24% of the time Expression Expression Mode: Verbal Expression: 5-Expresses basic 90% of the time/requires cueing < 10% of the time. Social Interaction Social Interaction: 4-Interacts appropriately 75 - 89% of the time - Needs redirection for appropriate language or to initiate interaction. Problem Solving Problem Solving: 4-Solves basic 75 - 89% of the time/requires cueing 10 - 24% of the time Memory Memory: 3-Recognizes or recalls 50 - 74% of the time/requires cueing 25 - 49% of the time  Pain Pain Assessment Faces Pain Scale: Hurts little more Pain Type: Chronic pain Pain Location: Back Pain Orientation: Lower Pain Descriptors / Indicators: Aching Pain Onset: On-going Pain Intervention(s): Repositioned  Therapy/Group: Individual Therapy  Jackalyn LombardNicole Marshawn Normoyle, M.A. CCC-SLP  Arriyana Rodell, Melanee SpryNicole L 10/02/2013, 5:45 PM

## 2013-10-02 NOTE — Progress Notes (Signed)
Occupational Therapy Session Note  Patient Details  Name: Elijah [name] MRN: [id number] Date of Birth: 07/19/1933  Today's Date: 10/02/2013 Time: 0800-0900 Time Calculation (min): 60 min  Short Term Goals: Week 1:  OT Short Term Goal 1 (Week 1): Pt will be able to sit EOB with mod A x 1 for 10 min to engage in UB bathing and dressing. OT Short Term Goal 1 - Progress (Week 1): Met OT Short Term Goal 2 (Week 1): Pt will be able to bathe his UB with min A. OT Short Term Goal 2 - Progress (Week 1): Met OT Short Term Goal 3 (Week 1): Pt will be able to don a tshirt with mod A. OT Short Term Goal 3 - Progress (Week 1): Met OT Short Term Goal 4 (Week 1): Pt will be able to transfer to w/c from bed with max A x1. OT Short Term Goal 4 - Progress (Week 1): Progressing toward goal OT Short Term Goal 5 (Week 1): Pt will demonstrate improved direction following during simple grooming with min cues. OT Short Term Goal 5 - Progress (Week 1): Met Week 2:  OT Short Term Goal 1 (Week 2): Groom:  complete 3 groom tasks while seated in w/c at sink OT Short Term Goal 1 - Progress (Week 2): Met OT Short Term Goal 2 (Week 2): Bath:  8/10 tasks sitting EOB with setup/cues OT Short Term Goal 2 - Progress (Week 2): Met OT Short Term Goal 3 (Week 2): LB Dressing:  Patient will be close supervision to donn feet into pants while sitting EOB. OT Short Term Goal 3 - Progress (Week 2): Progressing toward goal OT Short Term Goal 4 (Week 2): Bed Mobility:  Min assist for side lying to sit without bed rail and with HOB down to prepare for BADL at EOB. OT Short Term Goal 4 - Progress (Week 2): Met Week 3:  OT Short Term Goal 1 (Week 3): Pt will transfer on and off toilet with min A using grab bars and squat or stand pivot transfer. OT Short Term Goal 2 (Week 3): Pt will stand up from w/c with steady A only to manage clothing over hips. OT Short Term Goal 3 (Week 3): Pt will be able to stand to wash buttocks and perineal  area with steady A. OT Short Term Goal 4 (Week 3): Pt will be able to don pants over feet using a reacher with min A.  Skilled Therapeutic Interventions/Progress Updates:      Pt seen for BADL retraining of toileting, bathing, and dressing with a focus on sit to stand, standing balance, use of AE, and activity tolerance. Pt did not want to get OOB due to back pain, but eventually agreed to participate as much as possible. Sat to EOB with min A due to back pain, completed UB bathing and dressing from EOB with good sitting balance. Transferred to [place] with steady A only. Pt did not feel that he had to have a bowel movement but was willing to try to use BR. Used grab bars to transfer to [facility] over toilet with steady A. Pt was slightly soiled and he cleansed himself with paper in a semi stand position.  At sink in w/c, completed LB self care, he can not reach feet to bathe due to back pain (will try long sponge).  Used reacher with min A to guide shorts over his feet.  Pt did stand 2x at sink with steady A but only for  a very short time period due to back pain.  Pt wanted to go back to bed, but he had PT next. He was agreeable to go to gym for back stretching. Pt on mat and worked on gentle back stretches on pulling one knee into chest, both knees, and then gentle trunk twists with one knee across chest. Pt's PT had arrived for the next session.  Therapy Documentation Precautions:  Precautions Precautions: Fall Precaution Comments:  pain low back limits volitional movement Restrictions Weight Bearing Restrictions: No    Vital Signs: Therapy Vitals BP: 129/80 mmHg Pain: Pain Assessment Pain Assessment: 0-10 Pain Score: 8  Pain Type: Chronic pain Pain Location: Back Pain Orientation: Lower Pain Descriptors / Indicators: Aching Pain Intervention(s): RN made aware;Repositioned;Back rub ADL: ADL ADL Comments: refer to FIM  See FIM for current functional status  Therapy/Group: Individual  Therapy  [place] 10/02/2013, 9:11 AM

## 2013-10-03 ENCOUNTER — Inpatient Hospital Stay (HOSPITAL_COMMUNITY): Payer: Medicare Other | Admitting: Occupational Therapy

## 2013-10-03 ENCOUNTER — Inpatient Hospital Stay (HOSPITAL_COMMUNITY): Payer: Medicare Other | Admitting: Speech Pathology

## 2013-10-03 ENCOUNTER — Inpatient Hospital Stay (HOSPITAL_COMMUNITY): Payer: Medicare Other

## 2013-10-03 LAB — COMPREHENSIVE METABOLIC PANEL
ALT: 178 U/L — ABNORMAL HIGH (ref 0–53)
AST: 121 U/L — ABNORMAL HIGH (ref 0–37)
Albumin: 1.9 g/dL — ABNORMAL LOW (ref 3.5–5.2)
Alkaline Phosphatase: 1539 U/L — ABNORMAL HIGH (ref 39–117)
Anion gap: 17 — ABNORMAL HIGH (ref 5–15)
BUN: 18 mg/dL (ref 6–23)
CO2: 17 mEq/L — ABNORMAL LOW (ref 19–32)
Calcium: 8.2 mg/dL — ABNORMAL LOW (ref 8.4–10.5)
Chloride: 100 mEq/L (ref 96–112)
Creatinine, Ser: 1 mg/dL (ref 0.50–1.35)
GFR calc Af Amer: 82 mL/min — ABNORMAL LOW (ref >90)
GFR calc non Af Amer: 70 mL/min — ABNORMAL LOW (ref >90)
Glucose, Bld: 159 mg/dL — ABNORMAL HIGH (ref 70–99)
Potassium: 4.6 mEq/L (ref 3.7–5.3)
Sodium: 134 mEq/L — ABNORMAL LOW (ref 137–147)
Total Bilirubin: 1 mg/dL (ref 0.3–1.2)
Total Protein: 7.2 g/dL (ref 6.0–8.3)

## 2013-10-03 LAB — GLUCOSE, CAPILLARY
Glucose-Capillary: 77 mg/dL (ref 70–99)
Glucose-Capillary: 158 mg/dL — ABNORMAL HIGH (ref 70–99)
Glucose-Capillary: 185 mg/dL — ABNORMAL HIGH (ref 70–99)
Glucose-Capillary: 228 mg/dL — ABNORMAL HIGH (ref 70–99)

## 2013-10-03 MED ORDER — TRAMADOL HCL 50 MG PO TABS
100.0000 mg | ORAL_TABLET | Freq: Two times a day (BID) | ORAL | Status: DC | PRN
  Administered 2013-10-04 – 2013-10-07 (×5): 100 mg via ORAL
  Filled 2013-10-03 (×5): qty 2

## 2013-10-03 NOTE — Patient Care Conference (Signed)
Inpatient RehabilitationTeam Conference and Plan of Care Update Date: 10/03/2013   Time: 10:40 AM    Patient Name: Elijah [name]      Medical Record Number: [id number]  Date of Birth: [birth year] Sex: Male         Room/Bed: 4W03C/4W03C-01 Payor Info: Payor: Advertising copywriter MEDICARE / Plan: [employer] MEDICARE COMPLETE / Product Type: *No Product type* /    Admitting Diagnosis: CVA  Admit Date/Time:  09/11/2013  2:27 PM Admission Comments: No comment available   Primary Diagnosis:  <principal problem not specified> Principal Problem: <principal problem not specified>  Patient Active Problem List   Diagnosis Date Noted  . CVA (cerebral infarction) 09/11/2013    Expected Discharge Date: Expected Discharge Date: 10/09/13  Team Members Present: Physician leading conference: Dr. Claudette Laws Social Worker Present: Dossie Der, LCSW Nurse Present: Carlean Purl, RN PT Present: Wanda Plump, PT;Blair Hobble, Nita Sickle, PT OT Present: Rosalio Loud, OT SLP Present: Jackalyn Lombard, SLP PPS Coordinator present : Edson Snowball, Chapman Fitch, RN, CRRN     Current Status/Progress Goal Weekly Team Focus  Medical   no open areas, still with foley  improve po  d/c foley   Bowel/Bladder   incontinent of bowel, foley cath intact, LBM 7/13  manage bowel and bladder with max assist  offer toileting for bowel movements, assess foley cath   Swallow/Nutrition/ Hydration   dys 1 textures and nectar-thick liquids with full staff supervsion, mod assist   least restrictive PO with min assist   diet tolerance, trials of upgraded textures once dentures have been replaced   ADL's   continuing to improve well with cognition, strength; back pain a major limitation; supervision for UB self care, mod a for LB self care, min A toileting; steady A to min A transfers  min A overall  ADL retraining, functional mobility, pt/family education   Mobility   Min A bed mobiltiy, Min A transfers, gait x20'  mod A with RW, Mod A stairs x5, S WC prop x150' - limited by LBP  S bed mobiltiy, min A transfers transfers, gait x50' min A, WC x150' with S  Pain management, activity tolerance, gait, strengthening, transfers, family training. Still need air bed?   Communication   mod assist   min assist   increase recall of taught compensatory strategies and self-mointoring    Safety/Cognition/ Behavioral Observations  max assist   min assist   continue to increase sustained attention, problem solving, awareness    Pain   n/a  remain painfree  monitor   Skin   stage 2 PU bilateral buttock with santyl and moist gauze; moisture related skin breakdown barrier cream and microguard applied; air mattress  skin free of infection and no further skin breakdown  keep skin dry, turn q2-3h while in bed, continue dressing changes as ordered      *See Care Plan and progress notes for long and short-term goals.  Barriers to Discharge: inconsistent po    Possible Resolutions to Barriers:  IVF to supplement, enc po, avoid PEG    Discharge Planning/Teaching Needs:  Family coming up with a plan for discharge-feel will be NHP, but not confirmed by family.  Will try to get to make decision or begin family education      Team Discussion:  Making progress-d/c foley for voiding trail.  Back pain still limits him but better with lidoderm patch and tramodol.  D/C overlay mattress skin healed enough doesn't need now.  Water protocol- trial  of dys 2 problems with no dentures-requested dys 1 diet-better for gums.  Revisions to Treatment Plan:  Need decision form family regarding home versus NHP   Continued Need for Acute Rehabilitation Level of Care: The patient requires daily medical management by a physician with specialized training in physical medicine and rehabilitation for the following conditions: Daily direction of a multidisciplinary physical rehabilitation program to ensure safe treatment while eliciting the highest  outcome that is of practical value to the patient.: Yes Daily medical management of patient stability for increased activity during participation in an intensive rehabilitation regime.: Yes Daily analysis of laboratory values and/or radiology reports with any subsequent need for medication adjustment of medical intervention for : Neurological problems;Other  Swayzee Wadley, Lemar Livingsebecca G 10/03/2013, 2:22 PM

## 2013-10-03 NOTE — Consult Note (Addendum)
WOC wound follow-up consult note Reason for Consult:Reassess previous wounds to buttocks Wound type: previously full thickness wounds X 2 to upper buttocks with 100% slough Measurement: pink and healed, skin intact.  No further areas of open wounds or drainage.  Heels also without wounds. Wound bed: pink healed intact, fissure in gluteal cleft due to moisture (no interventions recommended).  Moisture associated skin damage to groin and posterior scrotum.  Scrotum and inguinal area is very swollen and hard to palpation from unknown etiology. Drainage: none Periwound: pink, intact Dressing procedure/placement/frequency: discontinue Santyl, pt remains incontinent of stool, foley catheter can be discontinued if desired; barrier cream as needed to groin and buttocks to protect from moisture and promote healing. Charlesetta GaribaldiJody Harris, RN, BSN, MSN-Student Please re-consult if further assistance is needed.  Thank-you,  Cammie Mcgeeawn Tosca Pletz MSN, RN, CWOCN, [place]CN-AP, CNS [phone]

## 2013-10-03 NOTE — Progress Notes (Addendum)
Subjective/Complaints: Low back pain during PT, OT not in bed No radiating pain to legs, no leg numbness Has Kpad and Lidoderm Review of Systems - limited secondary to mental status  Objective: Vital Signs: Blood pressure 125/75, pulse 91, temperature 98 F (36.7 C), temperature source Oral, resp. rate 20, weight 87.544 kg (193 lb), SpO2 97.00%. No results found. Results for orders placed during the hospital encounter of 09/11/13 (from the past 72 hour(s))  GLUCOSE, CAPILLARY     Status: Abnormal   Collection Time    09/30/13 12:01 PM      Result Value Ref Range   Glucose-Capillary 186 (*) 70 - 99 mg/dL  GLUCOSE, CAPILLARY     Status: Abnormal   Collection Time    09/30/13  4:34 PM      Result Value Ref Range   Glucose-Capillary 176 (*) 70 - 99 mg/dL  GLUCOSE, CAPILLARY     Status: Abnormal   Collection Time    09/30/13  8:40 PM      Result Value Ref Range   Glucose-Capillary 190 (*) 70 - 99 mg/dL  CBC     Status: Abnormal   Collection Time    10/01/13  6:52 AM      Result Value Ref Range   WBC 11.4 (*) 4.0 - 10.5 K/uL   RBC 2.74 (*) 4.22 - 5.81 MIL/uL   Hemoglobin 8.6 (*) 13.0 - 17.0 g/dL   HCT 26.4 (*) 39.0 - 52.0 %   MCV 96.4  78.0 - 100.0 fL   MCH 31.4  26.0 - 34.0 pg   MCHC 32.6  30.0 - 36.0 g/dL   RDW 16.2 (*) 11.5 - 15.5 %   Platelets 269  150 - 400 K/uL  GLUCOSE, CAPILLARY     Status: Abnormal   Collection Time    10/01/13  7:28 AM      Result Value Ref Range   Glucose-Capillary 112 (*) 70 - 99 mg/dL  GLUCOSE, CAPILLARY     Status: Abnormal   Collection Time    10/01/13 11:27 AM      Result Value Ref Range   Glucose-Capillary 193 (*) 70 - 99 mg/dL  GLUCOSE, CAPILLARY     Status: Abnormal   Collection Time    10/01/13  4:18 PM      Result Value Ref Range   Glucose-Capillary 257 (*) 70 - 99 mg/dL  GLUCOSE, CAPILLARY     Status: Abnormal   Collection Time    10/01/13  9:04 PM      Result Value Ref Range   Glucose-Capillary 128 (*) 70 - 99 mg/dL   GLUCOSE, CAPILLARY     Status: None   Collection Time    10/02/13  7:20 AM      Result Value Ref Range   Glucose-Capillary 89  70 - 99 mg/dL  GLUCOSE, CAPILLARY     Status: Abnormal   Collection Time    10/02/13 12:01 PM      Result Value Ref Range   Glucose-Capillary 153 (*) 70 - 99 mg/dL   Comment 1 Notify RN    GLUCOSE, CAPILLARY     Status: Abnormal   Collection Time    10/02/13  4:25 PM      Result Value Ref Range   Glucose-Capillary 149 (*) 70 - 99 mg/dL   Comment 1 Notify RN    GLUCOSE, CAPILLARY     Status: Abnormal   Collection Time    10/02/13  9:01 PM  Result Value Ref Range   Glucose-Capillary 123 (*) 70 - 99 mg/dL  GLUCOSE, CAPILLARY     Status: None   Collection Time    10/03/13  7:19 AM      Result Value Ref Range   Glucose-Capillary 77  70 - 99 mg/dL     HEENT: normal Cardio: irregular and no murmurs Resp: CTA B/L and unlabored GI: BS positive and large bilateral inguinal mass, nontender Extremity:  No Edema  Neuro: alert, improved insight and awareness , Abnormal Motor 4/5 in BUE,  3+/5 in BLE, Abnormal FMC Ataxic/ dec FMC, Dysarthric and Apraxic. More alert Musc/Skel:  Mild pain with palpation over lower lumbar spine particularly on the left Gen NAD Skin:  Heels red, buttocks without open area, inc stool  Assessment/Plan: 1. Functional deficits secondary to bilateral cerebral infarct embolic which require 3+ hours per day of interdisciplinary therapy in a comprehensive inpatient rehab setting. Physiatrist is providing close team supervision and 24 hour management of active medical problems listed below. Physiatrist and rehab team continue to assess barriers to discharge/monitor patient progress toward functional and medical goals.  FIM: FIM - Bathing Bathing Steps Patient Completed: Chest;Right Arm;Left Arm;Abdomen;Right upper leg;Left upper leg;Front perineal area Bathing: 3: Mod-Patient completes 5-7 [id number] parts or 50-74%  FIM - Upper Body  Dressing/Undressing Upper body dressing/undressing steps patient completed: Thread/unthread right sleeve of pullover shirt/dresss;Put head through opening of pull over shirt/dress;Thread/unthread left sleeve of pullover shirt/dress;Pull shirt over trunk Upper body dressing/undressing: 5: Supervision: Safety issues/verbal cues FIM - Lower Body Dressing/Undressing Lower body dressing/undressing steps patient completed: Thread/unthread right pants leg;Thread/unthread left pants leg;Pull pants up/down Lower body dressing/undressing: 3: Mod-Patient completed 50-74% of tasks  FIM - Toileting Toileting steps completed by patient: Adjust clothing prior to toileting;Adjust clothing after toileting;Performs perineal hygiene Toileting: 4: Steadying assist  FIM - TRadio producerDevices: Bedside commode;Grab bars Toilet Transfers: 4-To toilet/BSC: Min A (steadying Pt. > 75%);4-From toilet/BSC: Min A (steadying Pt. > 75%)  FIM - BControl and instrumentation engineerDevices: Walker;Arm rests Bed/Chair Transfer: 4: Bed > Chair or W/C: Min A (steadying Pt. > 75%);3: Chair or W/C > Bed: Mod A (lift or lower assist);3: Supine > Sit: Mod A (lifting assist/Pt. 50-74%/lift 2 legs  FIM - Locomotion: Wheelchair Distance: 150 Locomotion: Wheelchair: 1: Total Assistance/staff pushes wheelchair (Pt<25%) FIM - Locomotion: Ambulation Locomotion: Ambulation Assistive Devices: WAdministratorAmbulation/Gait Assistance: 3: Mod assist Locomotion: Ambulation: 1: Travels less than 50 ft with moderate assistance (Pt: 50 - 74%)  Comprehension Comprehension Mode: Auditory Comprehension: 4-Understands basic 75 - 89% of the time/requires cueing 10 - 24% of the time  Expression Expression Mode: Verbal Expression: 5-Expresses basic 90% of the time/requires cueing < 10% of the time.  Social Interaction Social Interaction: 4-Interacts appropriately 75 - 89% of the time - Needs  redirection for appropriate language or to initiate interaction.  Problem Solving Problem Solving: 4-Solves basic 75 - 89% of the time/requires cueing 10 - 24% of the time  Memory Memory: 3-Recognizes or recalls 50 - 74% of the time/requires cueing 25 - 49% of the time  Medical Problem List and Plan:  1. Functional deficits secondary to acute cerebral white matter bilateral infarct  2. DVT Prophylaxis/Anticoagulation:off Eliquis per GI due to liver failure, change to ASA until ok to resume Eliquis. Off lovenox due to bleeding 3. Pain Management: added ultram, lidoderm patches helpful for low back. 4. Dysphagia with decreased nutritional storage. Dysphagia 1 nectar liquids.   -  appetite inconsistent pt blames losing teeth for poor appetite  -on megace but pt doesn't like taste, refusing 5. Neuropsych: This patient is not capable of making decisions on his own behalf.  6. Hypertension/atrial fibrillation. Norvasc 10 mg daily, hydralazine 10 mg 4 times a day, metoprolol 25 mg every 8 hours.   7 ID/methicillin sensitive staph aureus. Continue cefazolin 2 g intravenously every 8 hours x4 weeks total initiated 08/29/2013.   8. Diabetes mellitus with peripheral neuropathy.     -increased Levemir to 25 units each bedtime.   -sugars much improved although po intake is inadequate 9. Integumentary:improved d/c foley 10.  Inguinal mass, bilateral hernia, obviously chronic and not incarcerated, given recent CVA, no elective surg recommended at this time 11.  Elevated alk phos: off Lipitor,  ? lipitor vs autoimmune with increased IgG and IgA, liver ultrasound OK, appreciate close GI f/u  -AP and other LFT's continue to trend down  12.  Epistaxis: resolved 13. Anemia: recent iron tests indicate anemia of chronic disease.    -hgb 7.7 monitor for symptoms,  14.  Low back pain ,reviewed CT abd/'pelvis, no fractures, has lumbar facet degenerative changes at mulltiple levels no central spinal stenosis,pain  in sacral area, likely degenerative with exacerbation secondary to sedentary activity level, cont heat, lidoderm, add tramadol and adjust dose, monitor cognition and LFT    LOS (Days) 22 A FACE TO FACE EVALUATION WAS PERFORMED  Federica Allport E 10/03/2013, 8:56 AM

## 2013-10-03 NOTE — Progress Notes (Signed)
Occupational Therapy Session Note  Patient Details  Name: Elijah [name] MRN: [id number] Date of Birth: May 30, 1933  Today's Date: 10/03/2013 Time: 0805-0903 Time Calculation (min): 58 min  Short Term Goals: Week 1:  OT Short Term Goal 1 (Week 1): Pt will be able to sit EOB with mod A x 1 for 10 min to engage in UB bathing and dressing. OT Short Term Goal 1 - Progress (Week 1): Met OT Short Term Goal 2 (Week 1): Pt will be able to bathe his UB with min A. OT Short Term Goal 2 - Progress (Week 1): Met OT Short Term Goal 3 (Week 1): Pt will be able to don a tshirt with mod A. OT Short Term Goal 3 - Progress (Week 1): Met OT Short Term Goal 4 (Week 1): Pt will be able to transfer to w/c from bed with max A x1. OT Short Term Goal 4 - Progress (Week 1): Progressing toward goal OT Short Term Goal 5 (Week 1): Pt will demonstrate improved direction following during simple grooming with min cues. OT Short Term Goal 5 - Progress (Week 1): Met Week 2:  OT Short Term Goal 1 (Week 2): Groom:  complete 3 groom tasks while seated in w/c at sink OT Short Term Goal 1 - Progress (Week 2): Met OT Short Term Goal 2 (Week 2): Bath:  8/10 tasks sitting EOB with setup/cues OT Short Term Goal 2 - Progress (Week 2): Met OT Short Term Goal 3 (Week 2): LB Dressing:  Patient will be close supervision to donn feet into pants while sitting EOB. OT Short Term Goal 3 - Progress (Week 2): Progressing toward goal OT Short Term Goal 4 (Week 2): Bed Mobility:  Min assist for side lying to sit without bed rail and with HOB down to prepare for BADL at EOB. OT Short Term Goal 4 - Progress (Week 2): Met Week 3:  OT Short Term Goal 1 (Week 3): Pt will transfer on and off toilet with min A using grab bars and squat or stand pivot transfer. OT Short Term Goal 2 (Week 3): Pt will stand up from w/c with steady A only to manage clothing over hips. OT Short Term Goal 3 (Week 3): Pt will be able to stand to wash buttocks and perineal  area with steady A. OT Short Term Goal 4 (Week 3): Pt will be able to don pants over feet using a reacher with min A.  Skilled Therapeutic Interventions/Progress Updates:      Pt seen for BADL retraining of toileting, bathing, and dressing with a focus on functional mobility and pain management. Pt did not want to get OOB, but eventually agreed. Needed min A with bed mobility to guide his head forward to push up. Transferred to chair with min A using stand pivot and stepping his feet. Pt began UB self care and completed from W/c with S. He had sudden urge to use toilet and wanted a bed pan placed under him. Convinced pt to sit on toilet. Pt had a very large BM in which he needed A for clean up.  His toilet transfers are min A using grab bar, but needs much A to mange IV tubing as it gets tangled around him easily.  Pt transferred back to chair to don shorts over feet using reacher and then he stood to pull pants up with steady A. Attempted to distract pt from back pain, but he stated that he had to go back to bed  as laying on the flat mattress is the only way he gets relief.  Pt transferred back to bed with A to lift his legs into bed. Pt left in bed with call light in reach and 4 side rails up.    Therapy Documentation Precautions:  Precautions Precautions: Fall Precaution Comments:  pain low back limits volitional movement Restrictions Weight Bearing Restrictions: No General: General Amount of Missed OT Time (min): 2 Minutes Vital Signs: Therapy Vitals BP: 101/67 mmHg Patient Position (if appropriate): Lying Pain: Pain Assessment Pain Assessment: 0-10 Pain Score: 3  Faces Pain Scale: Hurts little more Pain Type: Chronic pain Pain Location: Back Pain Orientation: Lower Pain Descriptors / Indicators: Aching Pain Onset: With Activity Pain Intervention(s): RN made aware ADL: ADL ADL Comments: refer to FIM  See FIM for current functional status  Therapy/Group: Individual  Therapy  Aryanah Enslow 10/03/2013, 11:27 AM

## 2013-10-03 NOTE — Progress Notes (Addendum)
Physical Therapy Weekly Progress Note  Patient Details  Name: Elijah [name] MRN: [id number] Date of Birth: 10/29/1933  Beginning of progress report period: September 27, 2013 End of progress report period: October 03, 2013  Today's Date: 10/03/2013 Time: 8295-62130930-1032 Time Calculation (min): 62 min  Short term goals inadvertently omitted last week; present status: basic transfers with min> mod assist; w/c propulsion x 150' with supervision with cues for steering and staying on R side of hall; gait with RW x 10-20' or with +2 (pt with his arms around 2 helpers' shoulders) x 40-50', up/down 5 steps 2 rails with min assist, mod cues.  Patient continues to demonstrate the following deficits: attention, awareness, strength, flexibility, balance, functional mobility and locomotion and therefore will continue to benefit from skilled PT intervention to enhance overall performance with activity tolerance, balance, postural control, ability to compensate for deficits, functional use of  right upper extremity, right lower extremity, left upper extremity and left lower extremity, attention and awareness.  Patient progressing toward long term goals..  Plan of care revisions: stairs goal added..   Skilled Therapeutic Interventions/Progress Updates:  +2 tx  for skilled mobility and gait. Pt had low BP 101/67, and and felt too dizzy to move sitting> w/c at first, requiring a few minutes in sidelying to recover- see BP.  BP increased slightly during tx, to 109/75.    Tx focused on neuromuscular re-education via forced use, positioning, VCs, manual cues for pelvic mobility in sitting, sit>< stand, w/c propulsion , gait.  W/c propulsion using bil hands x 55' with superviison.   Gait with +2 assist, pt performing approx. 50% focusing on upright posture, forward gaze.     Therapy Documentation Precautions:  Precautions Precautions: Fall Precaution Comments:  pain low back limits volitional  movement Restrictions Weight Bearing Restrictions: No   Vital Signs: Therapy Vitals BP: 125/75 mmHg Pain: Pain Assessment Pain Assessment: 0-10 Pain Score: 3  Pain Type: Chronic pain Pain Location: Back Pain Orientation: Lower Pain Descriptors / Indicators: Aching Pain Intervention(s): Medication (See eMAR)   Locomotion : Ambulation Ambulation/Gait Assistance: 1: +2 Total assist Wheelchair Mobility Distance: 100 , supervision    See FIM for current functional status  Therapy/Group: Individual Therapy  Rudolfo Brandow 10/03/2013, 10:48 AM

## 2013-10-03 NOTE — Progress Notes (Signed)
Speech Language Pathology Daily Session Note  Patient Details  Name: Stormy CardJames A Moldovan MRN: [id number] Date of Birth: 07/13/1933  Today's Date: 10/03/2013 Time: [id number] Time Calculation (min): 60 min  Short Term Goals: Week 3: SLP Short Term Goal 1 (Week 3): Pt will demonstrate effectient mastication of Dys 2 textures with Min verbal cues SLP Short Term Goal 2 (Week 3): Pt will utilize speech intelligibility strategies at the phrase level with Min cues SLP Short Term Goal 3 (Week 3): Pt will consistently sustain attention to basic familiar task for 10 minutes with Mod cues SLP Short Term Goal 4 (Week 3): Pt will request help as needed with Mod question cues  SLP Short Term Goal 5 (Week 3): Pt will demonstrate basic problem solving during basic, familiar task with Min cues SLP Short Term Goal 6 (Week 3): Pt will utilize external memory aids to recall new and daily information with Mod cues  Skilled Therapeutic Interventions:  Pt was seen for skilled speech therapy targeting sustained attention and problem solving during structured tasks.  Upon arrival, pt required mod encouragement to participate in therapy out of bed, although he was agreeable to completing tasks while seated edge of bed.  Pt required mod assist verbal cues for redirection during a structured word search task which was continued from yesterday's therapy session.  Pt with lethargy and intermittently dozed off while searching for words.  When engaged in a more active board game task to facilitate improved alertness, pt was supervision level assist for planning, problem solving, and error awareness.  Pt was also supervision level assist for thought organization and mental flexibility during a semi-complex generative naming task.  Throughout the session, pt benefited from brief intermittent rest breaks due to fatigue and back pain.  Continue per current plan of care.    FIM:  Comprehension Comprehension Mode:  Auditory Comprehension: 5-Follows basic conversation/direction: With extra time/assistive device Expression Expression Mode: Verbal Expression: 5-Expresses basic needs/ideas: With extra time/assistive device Social Interaction Social Interaction: 4-Interacts appropriately 75 - 89% of the time - Needs redirection for appropriate language or to initiate interaction. Problem Solving Problem Solving: 5-Solves basic 90% of the time/requires cueing < 10% of the time Memory Memory: 3-Recognizes or recalls 50 - 74% of the time/requires cueing 25 - 49% of the time  Pain Pain Assessment Pain Assessment: Faces Faces Pain Scale: Hurts a little bit Pain Type: Chronic pain Pain Location: Back Pain Orientation: Lower Pain Descriptors / Indicators: Aching Pain Intervention(s): Repositioned  Therapy/Group: Individual Therapy  Emile Kyllo, Melanee SpryNicole L 10/03/2013, 5:17 PM

## 2013-10-04 ENCOUNTER — Inpatient Hospital Stay (HOSPITAL_COMMUNITY): Payer: Medicare Other

## 2013-10-04 ENCOUNTER — Inpatient Hospital Stay (HOSPITAL_COMMUNITY): Payer: Medicare Other | Admitting: Speech Pathology

## 2013-10-04 ENCOUNTER — Inpatient Hospital Stay (HOSPITAL_COMMUNITY): Payer: Medicare Other | Admitting: Occupational Therapy

## 2013-10-04 DIAGNOSIS — I6340 Cerebral infarction due to embolism of unspecified cerebral artery: Secondary | ICD-10-CM

## 2013-10-04 LAB — GLUCOSE, CAPILLARY
Glucose-Capillary: 76 mg/dL (ref 70–99)
Glucose-Capillary: 129 mg/dL — ABNORMAL HIGH (ref 70–99)
Glucose-Capillary: 154 mg/dL — ABNORMAL HIGH (ref 70–99)
Glucose-Capillary: 211 mg/dL — ABNORMAL HIGH (ref 70–99)

## 2013-10-04 NOTE — Progress Notes (Signed)
Subjective/Complaints: Pt states he is voiding but no record, cath this am Has Kpad and Lidoderm Review of Systems - limited secondary to mental status  Objective: Vital Signs: Blood pressure 105/61, pulse 82, temperature 98.8 F (37.1 C), temperature source Oral, resp. rate 18, weight 87.544 kg (193 lb), SpO2 92.00%. No results found. Results for orders placed during the hospital encounter of 09/11/13 (from the past 72 hour(s))  GLUCOSE, CAPILLARY     Status: Abnormal   Collection Time    10/01/13 11:27 AM      Result Value Ref Range   Glucose-Capillary 193 (*) 70 - 99 mg/dL  GLUCOSE, CAPILLARY     Status: Abnormal   Collection Time    10/01/13  4:18 PM      Result Value Ref Range   Glucose-Capillary 257 (*) 70 - 99 mg/dL  GLUCOSE, CAPILLARY     Status: Abnormal   Collection Time    10/01/13  9:04 PM      Result Value Ref Range   Glucose-Capillary 128 (*) 70 - 99 mg/dL  GLUCOSE, CAPILLARY     Status: None   Collection Time    10/02/13  7:20 AM      Result Value Ref Range   Glucose-Capillary 89  70 - 99 mg/dL  GLUCOSE, CAPILLARY     Status: Abnormal   Collection Time    10/02/13 12:01 PM      Result Value Ref Range   Glucose-Capillary 153 (*) 70 - 99 mg/dL   Comment 1 Notify RN    GLUCOSE, CAPILLARY     Status: Abnormal   Collection Time    10/02/13  4:25 PM      Result Value Ref Range   Glucose-Capillary 149 (*) 70 - 99 mg/dL   Comment 1 Notify RN    GLUCOSE, CAPILLARY     Status: Abnormal   Collection Time    10/02/13  9:01 PM      Result Value Ref Range   Glucose-Capillary 123 (*) 70 - 99 mg/dL  GLUCOSE, CAPILLARY     Status: None   Collection Time    10/03/13  7:19 AM      Result Value Ref Range   Glucose-Capillary 77  70 - 99 mg/dL  COMPREHENSIVE METABOLIC PANEL     Status: Abnormal   Collection Time    10/03/13  9:50 AM      Result Value Ref Range   Sodium 134 (*) 137 - 147 mEq/L   Potassium 4.6  3.7 - 5.3 mEq/L   Chloride 100  96 - 112 mEq/L   CO2 17 (*) 19 - 32 mEq/L   Glucose, Bld 159 (*) 70 - 99 mg/dL   BUN 18  6 - 23 mg/dL   Creatinine, Ser 9.44  0.50 - 1.35 mg/dL   Calcium 8.2 (*) 8.4 - 10.5 mg/dL   Total Protein 7.2  6.0 - 8.3 g/dL   Albumin 1.9 (*) 3.5 - 5.2 g/dL   AST 667 (*) 0 - 37 U/L   ALT 178 (*) 0 - 53 U/L   Alkaline Phosphatase 1539 (*) 39 - 117 U/L   Total Bilirubin 1.0  0.3 - 1.2 mg/dL   GFR calc non Af Amer 70 (*) >90 mL/min   GFR calc Af Amer 82 (*) >90 mL/min   Comment: (NOTE)     The eGFR has been calculated using the CKD EPI equation.     This calculation has not been validated in  all clinical situations.     eGFR's persistently <90 mL/min signify possible Chronic Kidney     Disease.   Anion gap 17 (*) 5 - 15  GLUCOSE, CAPILLARY     Status: Abnormal   Collection Time    10/03/13 11:40 AM      Result Value Ref Range   Glucose-Capillary 158 (*) 70 - 99 mg/dL  GLUCOSE, CAPILLARY     Status: Abnormal   Collection Time    10/03/13  4:32 PM      Result Value Ref Range   Glucose-Capillary 185 (*) 70 - 99 mg/dL  GLUCOSE, CAPILLARY     Status: Abnormal   Collection Time    10/03/13  8:59 PM      Result Value Ref Range   Glucose-Capillary 228 (*) 70 - 99 mg/dL     HEENT: normal Cardio: irregular and no murmurs Resp: CTA B/L and unlabored GI: BS positive and large bilateral inguinal mass, nontender Extremity:  No Edema  Neuro: alert, improved insight and awareness , Abnormal Motor 4/5 in BUE,  3+/5 in BLE, Abnormal FMC Ataxic/ dec FMC, Dysarthric and Apraxic. More alert Musc/Skel:  Mild pain with palpation over lower lumbar spine particularly on the left Gen NAD Skin:  Heels red, buttocks without open area, inc stool  Assessment/Plan: 1. Functional deficits secondary to bilateral cerebral infarct embolic which require 3+ hours per day of interdisciplinary therapy in a comprehensive inpatient rehab setting. Physiatrist is providing close team supervision and 24 hour management of active medical  problems listed below. Physiatrist and rehab team continue to assess barriers to discharge/monitor patient progress toward functional and medical goals.  FIM: FIM - Bathing Bathing Steps Patient Completed: Chest;Right Arm;Left Arm;Abdomen;Right upper leg;Left upper leg;Front perineal area Bathing: 3: Mod-Patient completes 5-7 61f 10 parts or 50-74%  FIM - Upper Body Dressing/Undressing Upper body dressing/undressing steps patient completed: Thread/unthread right sleeve of pullover shirt/dresss;Put head through opening of pull over shirt/dress;Thread/unthread left sleeve of pullover shirt/dress;Pull shirt over trunk Upper body dressing/undressing: 5: Supervision: Safety issues/verbal cues FIM - Lower Body Dressing/Undressing Lower body dressing/undressing steps patient completed: Thread/unthread right pants leg;Thread/unthread left pants leg;Pull pants up/down Lower body dressing/undressing: 3: Mod-Patient completed 50-74% of tasks  FIM - Toileting Toileting steps completed by patient: Adjust clothing prior to toileting;Adjust clothing after toileting Toileting: 3: Mod-Patient completed 2 of 3 steps  FIM - Radio producer Devices: Bedside commode;Grab bars Toilet Transfers: 4-To toilet/BSC: Min A (steadying Pt. > 75%);4-From toilet/BSC: Min A (steadying Pt. > 75%)  FIM - Control and instrumentation engineer Devices: Walker;Arm rests Bed/Chair Transfer: 4: Supine > Sit: Min A (steadying Pt. > 75%/lift 1 leg);4: Bed > Chair or W/C: Min A (steadying Pt. > 75%);3: Chair or W/C > Bed: Mod A (lift or lower assist)  FIM - Locomotion: Wheelchair Distance: 100 Locomotion: Wheelchair: 2: Travels 50 - 149 ft with supervision, cueing or coaxing FIM - Locomotion: Ambulation Locomotion: Ambulation Assistive Devices: Other (comment) (none) Ambulation/Gait Assistance: 1: +2 Total assist Locomotion: Ambulation: 1: Two helpers  Comprehension Comprehension Mode:  Auditory Comprehension: 4-Understands basic 75 - 89% of the time/requires cueing 10 - 24% of the time  Expression Expression Mode: Verbal Expression: 5-Expresses basic 90% of the time/requires cueing < 10% of the time.  Social Interaction Social Interaction: 4-Interacts appropriately 75 - 89% of the time - Needs redirection for appropriate language or to initiate interaction.  Problem Solving Problem Solving: 4-Solves basic 75 - 89% of the  time/requires cueing 10 - 24% of the time  Memory Memory: 3-Recognizes or recalls 50 - 74% of the time/requires cueing 25 - 49% of the time  Medical Problem List and Plan:  1. Functional deficits secondary to acute cerebral white matter bilateral infarct  2. DVT Prophylaxis/Anticoagulation:off Eliquis per GI due to liver failure, change to ASA until ok to resume Eliquis. Off lovenox due to bleeding 3. Pain Management: added ultram, lidoderm patches helpful for low back. 4. Dysphagia with decreased nutritional storage. Dysphagia 1 nectar liquids.   -appetite inconsistent pt blames losing teeth for poor appetite  -on megace but pt doesn't like taste, refusing 5. Neuropsych: This patient is not capable of making decisions on his own behalf.  6. Hypertension/atrial fibrillation. Norvasc 10 mg daily, hydralazine 10 mg 4 times a day, metoprolol 25 mg every 8 hours.   7 ID/methicillin sensitive staph aureus. Continue cefazolin 2 g intravenously every 8 hours x4 weeks total initiated 08/29/2013.   8. Diabetes mellitus with peripheral neuropathy.     -increased Levemir to 25 units each bedtime.   -sugars much improved although po intake is inadequate 9. Integumentary:improved d/c foley 10.  Inguinal mass, bilateral hernia, obviously chronic and not incarcerated, given recent CVA, no elective surg recommended at this time 11.  Elevated alk phos: off Lipitor,  ? lipitor vs autoimmune with increased IgG and IgA, liver ultrasound OK, -AP and other LFT's continue  to trend down 12.  Urinary retention replace foley 12.  Epistaxis: resolved 13. Anemia: recent iron tests indicate anemia of chronic disease.    -hgb 7.7 monitor for symptoms,  14.  Low back pain ,reviewed CT abd/'pelvis, no fractures, has lumbar facet degenerative changes at mulltiple levels no central spinal stenosis,pain in sacral area, likely degenerative with exacerbation secondary to sedentary activity level, cont heat, lidoderm, add tramadol and adjust dose, monitor cognition and LFT    LOS (Days) 23 A FACE TO FACE EVALUATION WAS PERFORMED  Gwenda Heiner E 10/04/2013, 7:46 AM

## 2013-10-04 NOTE — Progress Notes (Signed)
Social Work [name], male   DOB: 10/12/1933, 80 y.o.   MRN: [id number] Spoke with son-Alan via telephone to discuss team conference and the discharge plan.  He reports he and his brother work and can not be there with Mom and pt all of the time. At this time wife is not able to provide the amount of care pt requires, he would need to be supervision level, to return home with wife.  Son to discuss with other family members and Get back with this worker.  Informed him we could schedule pt's therapy schedule for when he is here.  Will work on realistic discharge plan, probable plan is short term [facility].

## 2013-10-04 NOTE — Progress Notes (Signed)
Occupational Therapy Weekly Progress Note  Patient Details  Name: Elijah [name] MRN: [id number] Date of Birth: 1933/04/01  Beginning of progress report period: September 27, 2013 End of progress report period: October 04, 2013  Today's Date: 10/04/2013 Time: 1010-1030 Time Calculation (min): 20 min   General Amount of Missed OT Time (min): 32 Minutes - pt refused therapy and wanted to get in bed due to severe back pain, pt stated he was premedicated   Patient has met 4 of 4 short term goals.  Pt has been making a great deal of progress with his functional mobility, strength, perception, and cognitive skills.    Patient continues to demonstrate the following deficits: chronic back pain that limits movement and activity tolerance, decreased endurance, decreased overall strength (4-/5) and therefore will continue to benefit from skilled OT intervention to enhance overall performance with BADL.  Patient progressing toward long term goals..  Continue plan of care.  OT Short Term Goals Week 1:  OT Short Term Goal 1 (Week 1): Pt will be able to sit EOB with mod A x 1 for 10 min to engage in UB bathing and dressing. OT Short Term Goal 1 - Progress (Week 1): Met OT Short Term Goal 2 (Week 1): Pt will be able to bathe his UB with min A. OT Short Term Goal 2 - Progress (Week 1): Met OT Short Term Goal 3 (Week 1): Pt will be able to don a tshirt with mod A. OT Short Term Goal 3 - Progress (Week 1): Met OT Short Term Goal 4 (Week 1): Pt will be able to transfer to w/c from bed with max A x1. OT Short Term Goal 4 - Progress (Week 1): Progressing toward goal OT Short Term Goal 5 (Week 1): Pt will demonstrate improved direction following during simple grooming with min cues. OT Short Term Goal 5 - Progress (Week 1): Met Week 2:  OT Short Term Goal 1 (Week 2): Groom:  complete 3 groom tasks while seated in w/c at sink OT Short Term Goal 1 - Progress (Week 2): Met OT Short Term Goal 2 (Week 2): Bath:  8/10  tasks sitting EOB with setup/cues OT Short Term Goal 2 - Progress (Week 2): Met OT Short Term Goal 3 (Week 2): LB Dressing:  Patient will be close supervision to donn feet into pants while sitting EOB. OT Short Term Goal 3 - Progress (Week 2): Progressing toward goal OT Short Term Goal 4 (Week 2): Bed Mobility:  Min assist for side lying to sit without bed rail and with HOB down to prepare for BADL at EOB. OT Short Term Goal 4 - Progress (Week 2): Met Week 3:  OT Short Term Goal 1 (Week 3): Pt will transfer on and off toilet with min A using grab bars and squat or stand pivot transfer. OT Short Term Goal 1 - Progress (Week 3): Met OT Short Term Goal 2 (Week 3): Pt will stand up from w/c with steady A only to manage clothing over hips. OT Short Term Goal 2 - Progress (Week 3): Met OT Short Term Goal 3 (Week 3): Pt will be able to stand to wash buttocks and perineal area with steady A. OT Short Term Goal 3 - Progress (Week 3): Met OT Short Term Goal 4 (Week 3): Pt will be able to don pants over feet using a reacher with min A. OT Short Term Goal 4 - Progress (Week 3): Met Week 4:  OT Short Term  Goal 1 (Week 4): STGs = LTGs as pt's d/c date is less than a week  Skilled Therapeutic Interventions/Progress Updates:    Pt seen for BADL retraining of toileting with a focus on functional mobility, balance, activity tolerance. Pt was on toilet with speech therapist at start of session.  Pt was able to stand and cleanse himself and pull shorts over hips. He completed transfer by stand pivot with steady A using grab bars. Pt washed hand at sink and stated he was not willing to participate anymore due to severe back pain. Pt stated he had received pain meds but could not tolerate sitting up. Encouraged pt to come to gym to rest on mat and do exercises intermittently, but pt refused stating he had to be in bed. Pt has consistently participated in OT despite the pain, but today he was frustrated and could not take  any more. Agreed to help pt get comfortable, he transferred to bed with min A and moved into sidelying with S. Provided pt with k pad under back and pillow between his knees.  Bed alarm set and call light in reach.  Continue OT 5-7 days a week 1-2x a day for 60-90 minutes with Balance/vestibular training;Cognitive remediation/compensation;Discharge planning;DME/adaptive equipment instruction;Functional mobility training;Neuromuscular re-education;Patient/family education;Self Care/advanced ADL retraining;Therapeutic Activities;Therapeutic Exercise;UE/LE Strength taining/ROM;Visual/perceptual remediation/compensation;UE/LE Coordination activities;Pain management to maximize his independence with ADLs.  Therapy Documentation Precautions:  Precautions Precautions: Fall Precaution Comments:  pain low back limits volitional movement Restrictions Weight Bearing Restrictions: No  General Amount of Missed OT Time (min): 32 Minutes   Pain: Pain Assessment Pain Assessment: 0-10 Pain Score: 9  Pain Type: Chronic pain Pain Location: Back Pain Orientation: Lower Pain Descriptors / Indicators: Aching Pain Onset: On-going Pain Intervention(s): Rest;Repositioned ADL: ADL ADL Comments: refer to FIM  See FIM for current functional status  Therapy/Group: Individual Therapy  Castle Shannon 10/04/2013, 11:40 AM

## 2013-10-04 NOTE — Progress Notes (Signed)
Physical Therapy Session Note  Patient Details  Name: Elijah CardJames A Viviani MRN: [id number] Date of Birth: 02/12/1934  Today's Date: 10/04/2013 Time: 1110-1210 Time Calculation (min): 60 min  Short Term Goals: Week 4:  PT Short Term Goal 1 (Week 4): = LTGs due to LOS  Skilled Therapeutic Interventions/Progress Updates:  Pt supine in bed, dozing but easily aroused.  He stated he felt weak, and low back pain is 9/10, and refused to get OOB.  Therapist consulted RN who checked with PA; pt is not able to have further pain meds at this time.  Pt currently has Lidoderm patches and K pad to address pain. See Doc flowsheets for BP.  Pt agreeable to participate in bedside tx for neuromuscular re-education via manual and tactile cues, and assistance as needed, to improve LE alignment, flexibility, and motor control for mobility:  2 x 10 bil bridging and ankle pumps; 1 x 10 R and L straight leg raises, bil hip add and bil hip IR, modified abd crunches, lower trunk rotation, bil shoulder protraction  Stretching bil hamstrings in supine using contract-relax-hold technique.  Functional mobility for rolling in bed focusing on use of LEs rather than pulling on rails, modified independent, very slowly due to pain.  Pt refused to get OOB for lunch.  Pt left with bed alarm on, all needs within reach. Pt reported pain level 5/10 at end of tx, but still has poor insight that movement can decrease his pain.    Therapy Documentation Precautions:  Precautions Precautions: Fall Precaution Comments:  pain low back limits volitional movement Restrictions Weight Bearing Restrictions: No   Vital Signs: Therapy Vitals BP: 97/69 mmHg, HR87 Pain: Pain Assessment Pain Assessment: Faces Pain Score: 9  (score 5/10 at end of session) Faces Pain Scale: Hurts little more Pain Type: Chronic pain Pain Location: Back Pain Orientation: Lower Pain Descriptors / Indicators: Aching Pain Onset: On-going Pain  Intervention(s): Medication (See eMAR);Heat applied;Repositioned;RN made aware;Ambulation/increased activity (pain decrease with activity in bed)    See FIM for current functional status  Therapy/Group: Individual Therapy  Brailyn Killion 10/04/2013, 12:28 PM

## 2013-10-04 NOTE — Progress Notes (Signed)
Speech Language Pathology Weekly Progress and Session Note  Patient Details  Name: Elijah [name] MRN: [id number] Date of Birth: 1933-05-07  Beginning of progress report period: September 27, 2013 End of progress report period: October 04, 2013  Today's Date: 10/04/2013 Time: 0902-1010 Time Calculation (min): 68 min  Short Term Goals: Week 3: SLP Short Term Goal 1 (Week 3): Pt will demonstrate effectient mastication of Dys 2 textures with Min verbal cues SLP Short Term Goal 1 - Progress (Week 3): Met SLP Short Term Goal 2 (Week 3): Pt will utilize speech intelligibility strategies at the phrase level with Min cues SLP Short Term Goal 2 - Progress (Week 3): Met SLP Short Term Goal 3 (Week 3): Pt will consistently sustain attention to basic familiar task for 10 minutes with Mod cues SLP Short Term Goal 3 - Progress (Week 3): Met SLP Short Term Goal 4 (Week 3): Pt will request help as needed with Mod question cues  SLP Short Term Goal 4 - Progress (Week 3): Met SLP Short Term Goal 5 (Week 3): Pt will demonstrate basic problem solving during basic, familiar task with Min cues SLP Short Term Goal 5 - Progress (Week 3): Met SLP Short Term Goal 6 (Week 3): Pt will utilize external memory aids to recall new and daily information with Mod cues SLP Short Term Goal 6 - Progress (Week 3): Progressing toward goal    New Short Term Goals: Week 4: SLP Short Term Goal 1 (Week 4): Pt will tolerate sips of thin liquids without overt s/s of aspiration noted at bedside for diet advancement.   SLP Short Term Goal 2 (Week 4): Pt will consistently sustain attention to semi-complex tasks for 15-20 minutes with Min cues SLP Short Term Goal 3 (Week 4): Pt will demonstrate semi-complex problem solving during self-care tasks with Min cues SLP Short Term Goal 4 (Week 4): Pt will utilize external memory aids to recall new and daily information with Mod cues  Weekly Progress Updates:  Pt made functional gains and met 5  of 6 short term goals this reporting period due to improved sustained attention, problem solving, and speech intelligibility.  Pt is currently at an overall min-mod assist level for cognitive tasks and is tolerating his diet with supervision for use of swallowing precautions to minimize risk of aspiration with dys 1 textures and nectar thick liquids.  Pt demonstrated improved mastication of dys 2 textures and was appropriate for a diet advancement this reporting period; however, given his missing dentition and sore gums, pt requested a downgrade back to dys 1 textures.  Pt is also tolerating cup sips of thin liquid water per the water protocol with no overt s/s of aspiration and supervision verbal cues for use of small sips and slow rate.  Pt's primary barriers continue to be pain and fatigue limitations; however, he benefits from frequent rest breaks and encouragement for participation.  Pt would continue to benefit from skilled speech therapy services while inpatient to maximize functional independence and reduce burden of care upon discharge.      Intensity: Minumum of 1-2 x/day, 30 to 90 minutes Frequency: 5 out of 7 days Duration/Length of Stay: 7/21, possibly pending SNF placement Treatment/Interventions: Cognitive remediation/compensation;Cueing hierarchy;Dysphagia/aspiration precaution training;Environmental controls;Functional tasks;Internal/external aids;Oral motor exercises;Patient/family education;Speech/Language facilitation   Daily Session  Skilled Therapeutic Interventions:  Pt was seen for skilled speech therapy targeting sustained attention and moderately complex problem solving.  Upon arrival, pt was partially reclined in bed and required moderate encouragement  for transfer to wheelchair.  Pt recognized therapist and at least 1 structured activity from yesterday's therapy session with subtle, intermittent question cues.  Pt benefited from mod verbal cues for safety awareness during  wheelchair propulsion from his room to the day room due to decreased awareness of obstacles in the environment.  SLP engaged pt in a moderately complex problem solving activity during which pt benefited from min assist-supervision level cues for redirection, initiation, thought organization, and error awareness.  Upon propelling wheelchair back to his room, pt independently asked for assistance to use the restroom.  During transfer from wheelchair to toilet, pt required mod-max assist for clothing management; however, no overt safety concerns were noted while pt was seated on toilet and pt appropriately asked for assistance with intermittent question cues when finished.  Pt was left on toilet with OT present to complete clean up from bowel movement.  Goals were updated to reflect pt's current progress in therapy and plan of care.         FIM:  Comprehension Comprehension Mode: Auditory Comprehension: 5-Follows basic conversation/direction: With extra time/assistive device Expression Expression: 5-Expresses basic needs/ideas: With extra time/assistive device Social Interaction Social Interaction: 4-Interacts appropriately 75 - 89% of the time - Needs redirection for appropriate language or to initiate interaction. Problem Solving Problem Solving: 4-Solves basic 75 - 89% of the time/requires cueing 10 - 24% of the time Memory Memory: 4-Recognizes or recalls 75 - 89% of the time/requires cueing 10 - 24% of the time  Pain Pain Assessment Pain Assessment: Faces Pain Score: 9  Faces Pain Scale: Hurts little more Pain Type: Chronic pain Pain Location: Back Pain Orientation: Lower Pain Descriptors / Indicators: Aching Pain Onset: On-going Pain Intervention(s): Repositioned  Therapy/Group: Individual Therapy  Windell Moulding, M.A. CCC-SLP  Sujay Grundman, Selinda Orion 10/04/2013, 12:22 PM

## 2013-10-05 ENCOUNTER — Inpatient Hospital Stay (HOSPITAL_COMMUNITY): Payer: Medicare Other | Admitting: Occupational Therapy

## 2013-10-05 ENCOUNTER — Inpatient Hospital Stay (HOSPITAL_COMMUNITY): Payer: Medicare Other | Admitting: Speech Pathology

## 2013-10-05 ENCOUNTER — Inpatient Hospital Stay (HOSPITAL_COMMUNITY): Payer: Medicare Other | Admitting: Physical Therapy

## 2013-10-05 DIAGNOSIS — I6340 Cerebral infarction due to embolism of unspecified cerebral artery: Secondary | ICD-10-CM

## 2013-10-05 LAB — GLUCOSE, CAPILLARY
Glucose-Capillary: 114 mg/dL — ABNORMAL HIGH (ref 70–99)
Glucose-Capillary: 153 mg/dL — ABNORMAL HIGH (ref 70–99)
Glucose-Capillary: 167 mg/dL — ABNORMAL HIGH (ref 70–99)
Glucose-Capillary: 166 mg/dL — ABNORMAL HIGH (ref 70–99)

## 2013-10-05 NOTE — Progress Notes (Signed)
Occupational Therapy Session Note  Patient Details  Name: Elijah [name] MRN: [id number] Date of Birth: 01/15/1934  Today's Date: 10/05/2013 Time: 1030-1130 Time Calculation (min): 60 min  Short Term Goals: Week 1:  OT Short Term Goal 1 (Week 1): Pt will be able to sit EOB with mod A x 1 for 10 min to engage in UB bathing and dressing. OT Short Term Goal 1 - Progress (Week 1): Met OT Short Term Goal 2 (Week 1): Pt will be able to bathe his UB with min A. OT Short Term Goal 2 - Progress (Week 1): Met OT Short Term Goal 3 (Week 1): Pt will be able to don a tshirt with mod A. OT Short Term Goal 3 - Progress (Week 1): Met OT Short Term Goal 4 (Week 1): Pt will be able to transfer to w/c from bed with max A x1. OT Short Term Goal 4 - Progress (Week 1): Progressing toward goal OT Short Term Goal 5 (Week 1): Pt will demonstrate improved direction following during simple grooming with min cues. OT Short Term Goal 5 - Progress (Week 1): Met Week 2:  OT Short Term Goal 1 (Week 2): Groom:  complete 3 groom tasks while seated in w/c at sink OT Short Term Goal 1 - Progress (Week 2): Met OT Short Term Goal 2 (Week 2): Bath:  8/10 tasks sitting EOB with setup/cues OT Short Term Goal 2 - Progress (Week 2): Met OT Short Term Goal 3 (Week 2): LB Dressing:  Patient will be close supervision to donn feet into pants while sitting EOB. OT Short Term Goal 3 - Progress (Week 2): Progressing toward goal OT Short Term Goal 4 (Week 2): Bed Mobility:  Min assist for side lying to sit without bed rail and with HOB down to prepare for BADL at EOB. OT Short Term Goal 4 - Progress (Week 2): Met Week 3:  OT Short Term Goal 1 (Week 3): Pt will transfer on and off toilet with min A using grab bars and squat or stand pivot transfer. OT Short Term Goal 1 - Progress (Week 3): Met OT Short Term Goal 2 (Week 3): Pt will stand up from w/c with steady A only to manage clothing over hips. OT Short Term Goal 2 - Progress (Week  3): Met OT Short Term Goal 3 (Week 3): Pt will be able to stand to wash buttocks and perineal area with steady A. OT Short Term Goal 3 - Progress (Week 3): Met OT Short Term Goal 4 (Week 3): Pt will be able to don pants over feet using a reacher with min A. OT Short Term Goal 4 - Progress (Week 3): Met Week 4:  OT Short Term Goal 1 (Week 4): STGs = LTGs as pt's d/c date is less than a week  Skilled Therapeutic Interventions/Progress Updates:    Pt scheduled for OT at 10am but he refused therapy at that time. Rescheduled pt for 30 min later to allow him to rest. At 1030 pt was not willing to get up so to encourage pt to participate, he needed a great deal of coaxing and bargaining. Pt finally agreed to bathe and don clothing along with sitting in chair so he could shave. Pt transferred to w/c to bathe at sink. He then shaved partially as the razors were not sharp enough to cut his thick mustache.  He did stand up 4x for bathing and to don briefs. He stood up briefly to pull pants up  but did not complete the process. He sat down suddenly and stated that he refused to stand again and had to get in bed. Pt transferred back to bed and pulled his pants over his hips. Assisted pt to get comfortable in bed with call light in reach.      Therapy Documentation Precautions:  Precautions Precautions: Fall Precaution Comments:  pain low back limits volitional movement Restrictions Weight Bearing Restrictions: No      Pain: Pain Assessment Pain Assessment: 0-10 Pain Score: 6  Pain Type: Chronic pain Pain Location: Back Pain Orientation: Lower Pain Descriptors / Indicators: Aching Pain Onset: On-going Pain Intervention(s): RN made aware;Repositioned  ADL: ADL ADL Comments: refer to FIM  See FIM for current functional status  Therapy/Group: Individual Therapy  Fusae Florio 10/05/2013, 11:53 AM

## 2013-10-05 NOTE — Progress Notes (Signed)
Subjective/Complaints: Foley replaced. No new issues. Resting comfortably Review of Systems - limited secondary to mental status  Objective: Vital Signs: Blood pressure 105/61, pulse 78, temperature 99 F (37.2 C), temperature source Oral, resp. rate 18, weight 87.544 kg (193 lb), SpO2 99.00%. No results found. Results for orders placed during the hospital encounter of 09/11/13 (from the past 72 hour(s))  GLUCOSE, CAPILLARY     Status: Abnormal   Collection Time    10/02/13 12:01 PM      Result Value Ref Range   Glucose-Capillary 153 (*) 70 - 99 mg/dL   Comment 1 Notify RN    GLUCOSE, CAPILLARY     Status: Abnormal   Collection Time    10/02/13  4:25 PM      Result Value Ref Range   Glucose-Capillary 149 (*) 70 - 99 mg/dL   Comment 1 Notify RN    GLUCOSE, CAPILLARY     Status: Abnormal   Collection Time    10/02/13  9:01 PM      Result Value Ref Range   Glucose-Capillary 123 (*) 70 - 99 mg/dL  GLUCOSE, CAPILLARY     Status: None   Collection Time    10/03/13  7:19 AM      Result Value Ref Range   Glucose-Capillary 77  70 - 99 mg/dL  COMPREHENSIVE METABOLIC PANEL     Status: Abnormal   Collection Time    10/03/13  9:50 AM      Result Value Ref Range   Sodium 134 (*) 137 - 147 mEq/L   Potassium 4.6  3.7 - 5.3 mEq/L   Chloride 100  96 - 112 mEq/L   CO2 17 (*) 19 - 32 mEq/L   Glucose, Bld 159 (*) 70 - 99 mg/dL   BUN 18  6 - 23 mg/dL   Creatinine, Ser 1.00  0.50 - 1.35 mg/dL   Calcium 8.2 (*) 8.4 - 10.5 mg/dL   Total Protein 7.2  6.0 - 8.3 g/dL   Albumin 1.9 (*) 3.5 - 5.2 g/dL   AST 121 (*) 0 - 37 U/L   ALT 178 (*) 0 - 53 U/L   Alkaline Phosphatase 1539 (*) 39 - 117 U/L   Total Bilirubin 1.0  0.3 - 1.2 mg/dL   GFR calc non Af Amer 70 (*) >90 mL/min   GFR calc Af Amer 82 (*) >90 mL/min   Comment: (NOTE)     The eGFR has been calculated using the CKD EPI equation.     This calculation has not been validated in all clinical situations.     eGFR's persistently <90 mL/min  signify possible Chronic Kidney     Disease.   Anion gap 17 (*) 5 - 15  GLUCOSE, CAPILLARY     Status: Abnormal   Collection Time    10/03/13 11:40 AM      Result Value Ref Range   Glucose-Capillary 158 (*) 70 - 99 mg/dL  GLUCOSE, CAPILLARY     Status: Abnormal   Collection Time    10/03/13  4:32 PM      Result Value Ref Range   Glucose-Capillary 185 (*) 70 - 99 mg/dL  GLUCOSE, CAPILLARY     Status: Abnormal   Collection Time    10/03/13  8:59 PM      Result Value Ref Range   Glucose-Capillary 228 (*) 70 - 99 mg/dL  GLUCOSE, CAPILLARY     Status: None   Collection Time    10/04/13  7:16 AM      Result Value Ref Range   Glucose-Capillary 76  70 - 99 mg/dL   Comment 1 Notify RN    GLUCOSE, CAPILLARY     Status: Abnormal   Collection Time    10/04/13 11:15 AM      Result Value Ref Range   Glucose-Capillary 129 (*) 70 - 99 mg/dL   Comment 1 Notify RN    GLUCOSE, CAPILLARY     Status: Abnormal   Collection Time    10/04/13  4:15 PM      Result Value Ref Range   Glucose-Capillary 154 (*) 70 - 99 mg/dL   Comment 1 Notify RN    GLUCOSE, CAPILLARY     Status: Abnormal   Collection Time    10/04/13  9:11 PM      Result Value Ref Range   Glucose-Capillary 211 (*) 70 - 99 mg/dL     HEENT: normal Cardio: irregular and no murmurs Resp: CTA B/L and unlabored GI: BS positive and large bilateral inguinal mass, nontender Extremity:  No Edema  Neuro: alert, improved insight and awareness , Abnormal Motor 4/5 in BUE,  3+/5 in BLE, Abnormal FMC Ataxic/ dec FMC, Dysarthric and Apraxic. More alert Musc/Skel:  Mild pain with palpation over lower lumbar spine particularly on the left Gen NAD Skin:  Heels red, buttocks without open area, inc stool  Assessment/Plan: 1. Functional deficits secondary to bilateral cerebral infarct embolic which require 3+ hours per day of interdisciplinary therapy in a comprehensive inpatient rehab setting. Physiatrist is providing close team supervision  and 24 hour management of active medical problems listed below. Physiatrist and rehab team continue to assess barriers to discharge/monitor patient progress toward functional and medical goals.  FIM: FIM - Bathing Bathing Steps Patient Completed: Chest;Right Arm;Left Arm;Abdomen;Right upper leg;Left upper leg;Front perineal area Bathing: 3: Mod-Patient completes 5-7 [id number] parts or 50-74%  FIM - Upper Body Dressing/Undressing Upper body dressing/undressing steps patient completed: Thread/unthread right sleeve of pullover shirt/dresss;Put head through opening of pull over shirt/dress;Thread/unthread left sleeve of pullover shirt/dress;Pull shirt over trunk Upper body dressing/undressing: 5: Supervision: Safety issues/verbal cues FIM - Lower Body Dressing/Undressing Lower body dressing/undressing steps patient completed: Thread/unthread right pants leg;Thread/unthread left pants leg;Pull pants up/down Lower body dressing/undressing: 3: Mod-Patient completed 50-74% of tasks  FIM - Toileting Toileting steps completed by patient: Adjust clothing prior to toileting;Adjust clothing after toileting;Performs perineal hygiene Toileting: 4: Steadying assist  FIM - TRadio producerDevices: Bedside commode;Grab bars Toilet Transfers: 4-To toilet/BSC: Min A (steadying Pt. > 75%);4-From toilet/BSC: Min A (steadying Pt. > 75%)  FIM - BControl and instrumentation engineerDevices: Walker;Arm rests Bed/Chair Transfer: 0: Activity did not occur  FIM - Locomotion: Wheelchair Distance: 100 Locomotion: Wheelchair: 0: Activity did not occur FIM - Locomotion: Ambulation Locomotion: Ambulation Assistive Devices: Other (comment) (none) Ambulation/Gait Assistance: 1: +2 Total assist Locomotion: Ambulation: 0: Activity did not occur  Comprehension Comprehension Mode: Auditory Comprehension: 5-Follows basic conversation/direction: With extra time/assistive  device  Expression Expression Mode: Verbal Expression: 5-Expresses basic needs/ideas: With extra time/assistive device  Social Interaction Social Interaction: 4-Interacts appropriately 75 - 89% of the time - Needs redirection for appropriate language or to initiate interaction.  Problem Solving Problem Solving: 4-Solves basic 75 - 89% of the time/requires cueing 10 - 24% of the time  Memory Memory: 4-Recognizes or recalls 75 - 89% of the time/requires cueing 10 - 24% of the time  Medical Problem List and Plan:  1. Functional deficits secondary to acute cerebral white matter bilateral infarct  2. DVT Prophylaxis/Anticoagulation:off Eliquis per GI due to liver failure, change to ASA until ok to resume Eliquis. Off lovenox due to bleeding 3. Pain Management: added ultram, lidoderm patches helpful for low back. 4. Dysphagia with decreased nutritional storage. Dysphagia 1 nectar liquids.   -appetite inconsistent but a little better  -on megace but pt doesn't like taste, refusing usually 5. Neuropsych: This patient is not capable of making decisions on his own behalf.  6. Hypertension/atrial fibrillation. Norvasc 10 mg daily, hydralazine 10 mg 4 times a day, metoprolol 25 mg every 8 hours.   7 ID/methicillin sensitive staph aureus. Continue cefazolin 2 g intravenously every 8 hours x4 weeks total initiated 08/29/2013.   8. Diabetes mellitus with peripheral neuropathy.      -Levemir   25 units each bedtime.   -sugars much improved although po intake is inadequate 9. Integumentary:improved d/c foley 10.  Inguinal mass, bilateral hernia, obviously chronic and not incarcerated, given recent CVA, no elective surg recommended at this time 11.  Elevated alk phos: off Lipitor,  ? lipitor vs autoimmune with increased IgG and IgA, liver ultrasound OK, -AP and other LFT's continue to trend down 12.  Urinary retention: replaced foley 12.  Epistaxis: resolved 13. Anemia: recent iron tests indicate  anemia of chronic disease.    -hgb 7.7 monitor for symptoms,  14.  Low back pain ,reviewed CT abd/'pelvis, no fractures, has lumbar facet degenerative changes at mulltiple levels no central spinal stenosis,pain in sacral area, likely degenerative with exacerbation secondary to sedentary activity level, cont heat, lidoderm, added tramadol and adjust dose, monitor cognition and LFT  -some improvement     LOS (Days) 24 A FACE TO FACE EVALUATION WAS PERFORMED  Bren Steers T 10/05/2013, 9:09 AM

## 2013-10-05 NOTE — Progress Notes (Signed)
Physical Therapy Session Note  Patient Details  Name: Elijah [name] MRN: [id number] Date of Birth: 07/16/1933  Today's Date: 10/05/2013 Time: [id number] and [id number] Time Calculation (min): 45 min and 30 min   Skilled Therapeutic Interventions/Progress Updates:  Treatment 1 for skilled co-treat with Ronette DeterAmanda Hyslop, PT and Bayard Huggerebecca Varner, PT:   W/C Management: PT instructs pt in w/c propulsion x 150' with B UE, focus on endurance and steering with turns on level surface, req SBA and cues for steering.   Therapeutic Exercise: PT instructs pt in low back ROM exercises seated in w/c: forward flexion, extension, side flexion R/L, and rotation R/L x 3 reps each direction, requiring verbal and tactile cues, in order to decrease back pain.   Gait training: PT instructs pt in ambulation with RW req min A for weight shifting and verbal cues for large step length and safe walker management x 15' x 2 reps, req seated rest break due to low endurance.   Treatment 2 for skilled co-treat with Ronette DeterAmanda Hyslop, PT and Bayard Huggerebecca Varner, PT: Therapeutic Activity: Roll L in bed without bedrail, but pt reaches for bedside chair armrest mod I - pt c/o dizziness and R beating nystagmus noted - dizziness passes after a few seconds. Side lie to sit transfer req min A without bedrail. Pt demonstrates ability to sit statically at edge of bed with standby assist. Min A squat-pivot transfer to the R from bed to w/c with verbal cues for hand placement. Min A squat-pivot transfer w/c to mat to L; mod I sit to side lie on mat. Pt req mod A short sit to long sit on mat for B LE and mod A to maintain long sit due to tight hamstrings and back pain.   Neuromuscular Reeducation: PT demonstrates Dix-Hallpike test and the following Canalith Repositioning Maneuvre to pt prior to testing him. Vertebral-Basilar Insufficiency test to both R and L sides done and negative for diploplia, dysphagia, dysarthria, dizziness, or drop attacks,  bilaterally. Significantly limited cervical ROM into extension and bilateral side flexion noted. Dix Hallpike test to the Left was inconclusive due to pt keeping eyes closed and moving head out of positioning. Dix Hallpike test to the R was negative for symptoms of dizziness and nystagmus. Pt refused re-testing the L side (original suspected side of inner ear impairment) due to back pain, and so pt was assisted back to bed.   Therapy Documentation Precautions:  Precautions Precautions: Fall Precaution Comments:  pain low back limits volitional movement Restrictions Weight Bearing Restrictions: No   Vital Signs: Therapy Vitals BP: 117/63 mmHg Pain: Pain Assessment Pain Assessment: 0-10 Pain Score: 7  Faces Pain Scale: Hurts a little bit Pain Type: Chronic pain Pain Location: Back Pain Orientation: Lower Pain Descriptors / Indicators: Aching Pain Onset: On-going Pain Intervention(s): Repositioned Multiple Pain Sites: No Tx 2: Pt c/o 6/10 pain in low back at beginning of treatment- heating pad in place and repositioning applied.   See FIM for current functional status  Therapy/Group: Co-Treatment Carlye GrippeAmanda M Hyslop, PT, DPT and Bayard Huggerebecca Varner, PT, DPT  [facility] WomenYSLOP,AMANDA M 10/05/2013, 1:04 PM

## 2013-10-05 NOTE — Progress Notes (Signed)
Speech Language Pathology Daily Session Note  Patient Details  Name: Stormy CardJames A Ottley MRN: [id number] Date of Birth: 04/29/1933  Today's Date: 10/05/2013 Time: [id number] Time Calculation (min): 45 min  Short Term Goals: Week 4: SLP Short Term Goal 1 (Week 4): Pt will tolerate sips of thin liquids without overt s/s of aspiration noted at bedside for diet advancement.   SLP Short Term Goal 2 (Week 4): Pt will consistently sustain attention to semi-complex tasks for 15-20 minutes with Min cues SLP Short Term Goal 3 (Week 4): Pt will demonstrate semi-complex problem solving during self-care tasks with Min cues SLP Short Term Goal 4 (Week 4): Pt will utilize external memory aids to recall new and daily information with Mod cues  Skilled Therapeutic Interventions:  Pt was seen for skilled speech therapy targeting dysphagia management and semi-complex problem solving.  Pt was asleep upon arrival and required mod encouragement to participate in therapy. SLP positioned pt in as upright a position as possible while in seated in bed to minimize risk of aspiration during trials of thin liquids.  Pt completed his own oral care with supervision instructional cues for thoroughness.  SLP completed skilled observations during trials of thin liquids with pt exhibiting no overt s/s of aspiration via straw or cup sips with spontaneous and natural use of slow, small sips to minimize risk of aspiraiton.  Additionally, pt's voice remained strong and clear following PO trials.  Recommend that pt's liquids be advanced from nectar thick to thin.  Furthermore, pt's nurse tech reports that pt self manages his swallowing precautions during meals following initial tray set up.  Recommend that pt be decreased from full to intermittent supervision.  SLP further facilitated session with a structured task targeting executive function for financial management.  Pt required overall mod assist verbal cues for error awareness and min  cuing for redirection in the setting of progressive fatigue.  Continue per current plan of care.   FIM:  Comprehension Comprehension Mode: Auditory Comprehension: 5-Follows basic conversation/direction: With extra time/assistive device Expression Expression Mode: Verbal Expression: 5-Expresses basic needs/ideas: With extra time/assistive device Social Interaction Social Interaction: 4-Interacts appropriately 75 - 89% of the time - Needs redirection for appropriate language or to initiate interaction. Problem Solving Problem Solving: 4-Solves basic 75 - 89% of the time/requires cueing 10 - 24% of the time Memory Memory: 4-Recognizes or recalls 75 - 89% of the time/requires cueing 10 - 24% of the time FIM - Eating Eating Activity: 5: Supervision/cues  Pain Pain Assessment Pain Assessment: Faces Pain Score: 6  Faces Pain Scale: Hurts a little bit Pain Type: Chronic pain Pain Location: Back Pain Orientation: Lower Pain Descriptors / Indicators: Aching Pain Onset: On-going Pain Intervention(s): Repositioned  Therapy/Group: Individual Therapy  Tramell Piechota, Melanee SpryNicole L 10/05/2013, 12:41 PM

## 2013-10-06 ENCOUNTER — Inpatient Hospital Stay (HOSPITAL_COMMUNITY): Payer: Medicare Other | Admitting: Speech Pathology

## 2013-10-06 ENCOUNTER — Inpatient Hospital Stay (HOSPITAL_COMMUNITY): Payer: Medicare Other | Admitting: Rehabilitation

## 2013-10-06 LAB — GLUCOSE, CAPILLARY
Glucose-Capillary: 56 mg/dL — ABNORMAL LOW (ref 70–99)
Glucose-Capillary: 87 mg/dL (ref 70–99)
Glucose-Capillary: 147 mg/dL — ABNORMAL HIGH (ref 70–99)
Glucose-Capillary: 142 mg/dL — ABNORMAL HIGH (ref 70–99)
Glucose-Capillary: 201 mg/dL — ABNORMAL HIGH (ref 70–99)

## 2013-10-06 MED ORDER — INSULIN DETEMIR 100 UNIT/ML ~~LOC~~ SOLN
20.0000 [IU] | Freq: Every day | SUBCUTANEOUS | Status: DC
  Administered 2013-10-06 – 2013-10-09 (×4): 20 [IU] via SUBCUTANEOUS
  Filled 2013-10-06 (×5): qty 0.2

## 2013-10-06 NOTE — Progress Notes (Addendum)
Speech Language Pathology Daily Session Note  Patient Details  Name: Elijah CardJames A Berkheimer MRN: [id number] Date of Birth: 05/26/1933  Today's Date: 10/06/2013 Time: [id number] Time Calculation (min): 30 min  Short Term Goals: Week 4: SLP Short Term Goal 1 (Week 4): Pt will tolerate sips of thin liquids without overt s/s of aspiration noted at bedside for diet advancement.   SLP Short Term Goal 2 (Week 4): Pt will consistently sustain attention to semi-complex tasks for 15-20 minutes with Min cues SLP Short Term Goal 3 (Week 4): Pt will demonstrate semi-complex problem solving during self-care tasks with Min cues SLP Short Term Goal 4 (Week 4): Pt will utilize external memory aids to recall new and daily information with Mod cues  Skilled Therapeutic Interventions: Skilled treatment session focused on addressing cognition goals. Patient required Mod multimodal cues tio utilize external aids for orientation and Max question cues to recognize why drinking from a straw while laying flat in bed is unsafe.  SLP also facilitated session with a new laerning task and Mod verbal cues to utilize repetition as a working Medical laboratory scientific officermemory strategy.  Continue with current plan of care.     FIM:  Comprehension Comprehension Mode: Auditory Comprehension: 5-Follows basic conversation/direction: With extra time/assistive device Expression Expression Mode: Verbal Expression: 5-Expresses basic needs/ideas: With extra time/assistive device Social Interaction Social Interaction: 4-Interacts appropriately 75 - 89% of the time - Needs redirection for appropriate language or to initiate interaction. Problem Solving Problem Solving: 3-Solves basic 50 - 74% of the time/requires cueing 25 - 49% of the time Memory Memory: 3-Recognizes or recalls 50 - 74% of the time/requires cueing 25 - 49% of the time FIM - Eating Eating Activity: 5: Supervision/cues  Pain Low back pain 8, RN notified and patient repositioned    Therapy/Group: Individual Therapy  Charlane FerrettiMelissa Tyronica Truxillo, M.A., CCC-SLP [phone]  Amorita Vanrossum 10/06/2013, 2:13 PM

## 2013-10-06 NOTE — Progress Notes (Signed)
[name], male   DOB: [birth year], 80 y.o.   MRN: [id number]   10/06/13. Subjective/Complaints:  80 y/o WM  Admit for CIR with  functional deficits secondary to acute cerebral white matter bilateral infarct. History of HTN and DM. FBS this am 56.  Remains on basal/bolus insulin-amaryl d/ced.    No new issues. Resting comfortably Review of Systems - alert offering no complaints.  Objective: Vital Signs: Blood pressure 110/67, pulse 80, temperature 98 F (36.7 C), temperature source Oral, resp. rate 17, weight 193 lb (87.544 kg), SpO2 94.00%. No results found. Results for orders placed during the hospital encounter of 09/11/13 (from the past 72 hour(s))  COMPREHENSIVE METABOLIC PANEL     Status: Abnormal   Collection Time    10/03/13  9:50 AM      Result Value Ref Range   Sodium 134 (*) 137 - 147 mEq/L   Potassium 4.6  3.7 - 5.3 mEq/L   Chloride 100  96 - 112 mEq/L   CO2 17 (*) 19 - 32 mEq/L   Glucose, Bld 159 (*) 70 - 99 mg/dL   BUN 18  6 - 23 mg/dL   Creatinine, Ser 1.00  0.50 - 1.35 mg/dL   Calcium 8.2 (*) 8.4 - 10.5 mg/dL   Total Protein 7.2  6.0 - 8.3 g/dL   Albumin 1.9 (*) 3.5 - 5.2 g/dL   AST 121 (*) 0 - 37 U/L   ALT 178 (*) 0 - 53 U/L   Alkaline Phosphatase 1539 (*) 39 - 117 U/L   Total Bilirubin 1.0  0.3 - 1.2 mg/dL   GFR calc non Af Amer 70 (*) >90 mL/min   GFR calc Af Amer 82 (*) >90 mL/min   Comment: (NOTE)     The eGFR has been calculated using the CKD EPI equation.     This calculation has not been validated in all clinical situations.     eGFR's persistently <90 mL/min signify possible Chronic Kidney     Disease.   Anion gap 17 (*) 5 - 15  GLUCOSE, CAPILLARY     Status: Abnormal   Collection Time    10/03/13 11:40 AM      Result Value Ref Range   Glucose-Capillary 158 (*) 70 - 99 mg/dL  GLUCOSE, CAPILLARY     Status: Abnormal   Collection Time    10/03/13  4:32 PM      Result Value Ref Range   Glucose-Capillary 185 (*) 70 - 99 mg/dL   GLUCOSE, CAPILLARY     Status: Abnormal   Collection Time    10/03/13  8:59 PM      Result Value Ref Range   Glucose-Capillary 228 (*) 70 - 99 mg/dL  GLUCOSE, CAPILLARY     Status: None   Collection Time    10/04/13  7:16 AM      Result Value Ref Range   Glucose-Capillary 76  70 - 99 mg/dL   Comment 1 Notify RN    GLUCOSE, CAPILLARY     Status: Abnormal   Collection Time    10/04/13 11:15 AM      Result Value Ref Range   Glucose-Capillary 129 (*) 70 - 99 mg/dL   Comment 1 Notify RN    GLUCOSE, CAPILLARY     Status: Abnormal   Collection Time    10/04/13  4:15 PM      Result Value Ref Range   Glucose-Capillary 154 (*) 70 - 99 mg/dL  Comment 1 Notify RN    GLUCOSE, CAPILLARY     Status: Abnormal   Collection Time    10/04/13  9:11 PM      Result Value Ref Range   Glucose-Capillary 211 (*) 70 - 99 mg/dL  GLUCOSE, CAPILLARY     Status: Abnormal   Collection Time    10/05/13  7:19 AM      Result Value Ref Range   Glucose-Capillary 114 (*) 70 - 99 mg/dL  GLUCOSE, CAPILLARY     Status: Abnormal   Collection Time    10/05/13 11:41 AM      Result Value Ref Range   Glucose-Capillary 153 (*) 70 - 99 mg/dL   Comment 1 Notify RN    GLUCOSE, CAPILLARY     Status: Abnormal   Collection Time    10/05/13  4:46 PM      Result Value Ref Range   Glucose-Capillary 167 (*) 70 - 99 mg/dL  GLUCOSE, CAPILLARY     Status: Abnormal   Collection Time    10/05/13  9:09 PM      Result Value Ref Range   Glucose-Capillary 166 (*) 70 - 99 mg/dL  GLUCOSE, CAPILLARY     Status: Abnormal   Collection Time    10/06/13  7:29 AM      Result Value Ref Range   Glucose-Capillary 56 (*) 70 - 99 mg/dL  GLUCOSE, CAPILLARY     Status: None   Collection Time    10/06/13  7:56 AM      Result Value Ref Range   Glucose-Capillary 87  70 - 99 mg/dL    Patient Vitals for the past 24 hrs:  BP Temp Temp src Pulse Resp SpO2  10/06/13 0524 - 98 F (36.7 C) Oral - - 94 %  10/05/13 2202 110/67 mmHg 98.3 F  (36.8 C) Oral 80 17 97 %  10/05/13 1500 109/69 mmHg 98 F (36.7 C) Oral 73 18 98 %  10/05/13 1347 117/67 mmHg - - 92 - -  10/05/13 1218 117/63 mmHg - - - - -     Intake/Output Summary (Last 24 hours) at 10/06/13 0818 Last data filed at 10/06/13 0528  Gross per 24 hour  Intake    480 ml  Output   2050 ml  Net  -1570 ml     HEENT: normal Cardio: irregular and no murmurs Resp: CTA B/L and unlabored GI: BS positive and large bilateral inguinal mass, nontender Extremity:  No Edema; IVFs R arm; SCDs in place  Neuro: alert, improved insight and awareness , Abnormal Motor 4/5 in BUE,  3+/5 in BLE,Musc/Skel:   Gen NAD Skin:  Heels red, buttocks without open area, inc stool  Assessment/Plan: 1. Functional deficits secondary to bilateral cerebral infarct embolic which require 3+ hours per day of interdisciplinary therapy in a comprehensive inpatient rehab setting. 2. DVT Prophylaxis/Anticoagulation:off Eliquis per GI due to liver failure, change to ASA until ok to resume Eliquis. Off lovenox due to bleeding 3. Pain Management: added ultram, lidoderm patches helpful for low back. 4. Dysphagia with decreased nutritional storage. Dysphagia 1 nectar liquids.   -appetite inconsistent but a little better  -on megace but pt doesn't like taste, refusing usually 5. Neuropsych: This patient is not capable of making decisions on his own behalf.  6. Hypertension/atrial fibrillation. Norvasc 10 mg daily, hydralazine 10 mg 4 times a day, metoprolol 25 mg every 8 hours.   7 ID/methicillin sensitive staph aureus. Continue cefazolin 2 g  intravenously every 8 hours x4 weeks total initiated 08/29/2013.   8. Diabetes mellitus with peripheral neuropathy.      -Levemir   25 units each bedtime.  Will decrease slightly  -sugars much improved although po intake is inadequate 9. Integumentary:improved d/c foley 10.  Inguinal mass, bilateral hernia, obviously chronic and not incarcerated, given recent CVA, no  elective surg recommended at this time 11.  Elevated alk phos: off Lipitor,  ? lipitor vs autoimmune with increased IgG and IgA, liver ultrasound OK, -AP and other LFT's continue to trend down 12.  Urinary retention: replaced foley 12.  Epistaxis: resolved 13. Anemia: recent iron tests indicate anemia of chronic disease.    -hgb 7.7 monitor for symptoms,  14.  Low back pain ,reviewed CT abd/'pelvis, no fractures, has lumbar facet degenerative changes at mulltiple levels no central spinal stenosis,pain in sacral area, likely degenerative with exacerbation secondary to sedentary activity level, cont heat, lidoderm, added tramadol and adjust dose, monitor cognition and LFT  -some improvement     LOS (Days) 25 A FACE TO FACE EVALUATION WAS PERFORMED  Nyoka Cowden 10/06/2013, 8:13 AM

## 2013-10-06 NOTE — Progress Notes (Signed)
Physical Therapy Session Note  Patient Details  Name: Elijah [name] MRN: [id number] Date of Birth: 06/14/1933  Today's Date: 10/06/2013 Time: [id number] Time Calculation (min): 28 min  Short Term Goals: Week 3:     Skilled Therapeutic Interventions/Progress Updates:   Pt received lying in bed, initially refusing to get OOB.  Therapist provided max encouragement and no "choice" of getting OOB.  Pt then performed bed mobility with use of rails at S level.  Transferred to w/c at min A stand pivot.  Mod cues for hand placement and safety. Skilled therapy session focused on activity tolerance, increased participation, w/c mobility and short distance gait.  Pt self propelled w/c x 60' at S level with BUEs with only single rest break.  Pt then agreed to walk short distance (approx 15') with RW at min A.  Note increased posterior LOB/lean during gait with assist to maintain upright posture.  Assisted back to room and encouraged pt to sit up in w/c in order to eat lunch.  Pt left in w/c with QRB donned and all needs in reach.   Therapy Documentation Precautions:  Precautions Precautions: Fall Precaution Comments:  pain low back limits volitional movement Restrictions Weight Bearing Restrictions: No   Pain: Continues to have back pain, did not rate, but did participate in therapy.    Locomotion : Ambulation Ambulation/Gait Assistance: 4: Min Lawyerassist Wheelchair Mobility Distance: 60   See FIM for current functional status  Therapy/Group: Individual Therapy  Vista Deckarcell, [name] 10/06/2013, 12:46 PM

## 2013-10-07 ENCOUNTER — Inpatient Hospital Stay (HOSPITAL_COMMUNITY): Payer: Medicare Other | Admitting: *Deleted

## 2013-10-07 LAB — GLUCOSE, CAPILLARY
Glucose-Capillary: 97 mg/dL (ref 70–99)
Glucose-Capillary: 178 mg/dL — ABNORMAL HIGH (ref 70–99)
Glucose-Capillary: 216 mg/dL — ABNORMAL HIGH (ref 70–99)

## 2013-10-07 NOTE — Progress Notes (Signed)
Patient ID: GRACEN SOUTHWELL, male   DOB: [birth year], 80 y.o.   MRN: [id number]  Patient ID: ELMAN DETTMAN, male   DOB: May 29, 1933, 80 y.o.   MRN: [id number]   10/07/13.  Subjective/Complaints:  80 y/o WM  Admit for CIR with  functional deficits secondary to acute cerebral white matter bilateral infarct. History of HTN and DM. FBS yesterday am 56.  Remains on basal/bolus insulin-amaryl d/ced. Lantus downtitrated yesterday    No new issues. Resting comfortably Review of Systems - alert offering no complaints.  Objective: Vital Signs: Blood pressure 110/68, pulse 83, temperature 97.5 F (36.4 C), temperature source Oral, resp. rate 18, weight 193 lb (87.544 kg), SpO2 95.00%. No results found. Results for orders placed during the hospital encounter of 09/11/13 (from the past 72 hour(s))  GLUCOSE, CAPILLARY     Status: Abnormal   Collection Time    10/04/13 11:15 AM      Result Value Ref Range   Glucose-Capillary 129 (*) 70 - 99 mg/dL   Comment 1 Notify RN    GLUCOSE, CAPILLARY     Status: Abnormal   Collection Time    10/04/13  4:15 PM      Result Value Ref Range   Glucose-Capillary 154 (*) 70 - 99 mg/dL   Comment 1 Notify RN    GLUCOSE, CAPILLARY     Status: Abnormal   Collection Time    10/04/13  9:11 PM      Result Value Ref Range   Glucose-Capillary 211 (*) 70 - 99 mg/dL  GLUCOSE, CAPILLARY     Status: Abnormal   Collection Time    10/05/13  7:19 AM      Result Value Ref Range   Glucose-Capillary 114 (*) 70 - 99 mg/dL  GLUCOSE, CAPILLARY     Status: Abnormal   Collection Time    10/05/13 11:41 AM      Result Value Ref Range   Glucose-Capillary 153 (*) 70 - 99 mg/dL   Comment 1 Notify RN    GLUCOSE, CAPILLARY     Status: Abnormal   Collection Time    10/05/13  4:46 PM      Result Value Ref Range   Glucose-Capillary 167 (*) 70 - 99 mg/dL  GLUCOSE, CAPILLARY     Status: Abnormal   Collection Time    10/05/13  9:09 PM      Result Value Ref Range   Glucose-Capillary 166 (*) 70 - 99 mg/dL  GLUCOSE, CAPILLARY     Status: Abnormal   Collection Time    10/06/13  7:29 AM      Result Value Ref Range   Glucose-Capillary 56 (*) 70 - 99 mg/dL  GLUCOSE, CAPILLARY     Status: None   Collection Time    10/06/13  7:56 AM      Result Value Ref Range   Glucose-Capillary 87  70 - 99 mg/dL  GLUCOSE, CAPILLARY     Status: Abnormal   Collection Time    10/06/13 11:41 AM      Result Value Ref Range   Glucose-Capillary 147 (*) 70 - 99 mg/dL  GLUCOSE, CAPILLARY     Status: Abnormal   Collection Time    10/06/13  4:42 PM      Result Value Ref Range   Glucose-Capillary 142 (*) 70 - 99 mg/dL   Comment 1 Notify RN    GLUCOSE, CAPILLARY     Status: Abnormal   Collection Time  10/06/13  8:33 PM      Result Value Ref Range   Glucose-Capillary 201 (*) 70 - 99 mg/dL    Patient Vitals for the past 24 hrs:  BP Temp Temp src Pulse Resp SpO2  10/07/13 0603 110/68 mmHg - - - - -  10/07/13 0436 - 97.5 F (36.4 C) Oral - 18 95 %  10/07/13 0054 126/74 mmHg - - - - -  10/06/13 2048 121/70 mmHg - - 83 - -  10/06/13 2040 117/69 mmHg 98.5 F (36.9 C) Oral 78 19 96 %  10/06/13 1433 105/65 mmHg 97.8 F (36.6 C) Oral 77 18 97 %     Intake/Output Summary (Last 24 hours) at 10/07/13 0741 Last data filed at 10/07/13 0700  Gross per 24 hour  Intake   1380 ml  Output   3125 ml  Net  -1745 ml     HEENT: normal Cardio: irregular and no murmurs Resp: CTA B/L and unlabored GI: BS positive and large bilateral inguinal mass, nontender Extremity:  No Edema; IVFs R arm; SCDs in place GU- foley in place  Neuro: alert, improved insight and awareness , Abnormal Motor 4/5 in BUE,  3+/5 in BLE,Musc/Skel:   Gen NAD Skin:  No edema   Assessment/Plan: 1. Functional deficits secondary to bilateral cerebral infarct embolic  2. DVT Prophylaxis/Anticoagulation:off Eliquis per GI due to liver failure, change to ASA until ok to resume Eliquis. Off lovenox due to  bleeding 3. Pain Management: added ultram, lidoderm patches helpful for low back. 4. Dysphagia with decreased nutritional storage. Dysphagia 1 nectar liquids.   -appetite inconsistent but a little better  -on megace but pt doesn't like taste, refusing usually 5. Neuropsych: This patient is not capable of making decisions on his own behalf.  6. Hypertension/atrial fibrillation. Norvasc 10 mg daily, hydralazine 10 mg 4 times a day, metoprolol 25 mg every 8 hours.   7 ID/methicillin sensitive staph aureus. Continue cefazolin 2 g intravenously every 8 hours x4 weeks total initiated 08/29/2013.   8. Diabetes mellitus with peripheral neuropathy.      -Levemir    Decreased to 20 units each bedtime.     9. Integumentary:improved d/c foley 10.  Inguinal mass, bilateral hernia, obviously chronic and not incarcerated, given recent CVA, no elective surg recommended at this time 11.  Elevated alk phos: off Lipitor,  ? lipitor vs autoimmune with increased IgG and IgA, liver ultrasound OK, -AP and other LFT's continue to trend down 12.  Urinary retention: replaced foley 12.  Epistaxis: resolved 13. Anemia: recent iron tests indicate anemia of chronic disease.    -hgb 7.7 monitor for symptoms,  14.  Low back pain ,reviewed CT abd/'pelvis, no fractures, has lumbar facet degenerative changes at mulltiple levels no central spinal stenosis,pain in sacral area, likely degenerative with exacerbation secondary to sedentary activity level, cont heat, lidoderm, added tramadol and adjust dose, monitor cognition and LFT  -some improvement     LOS (Days) 26 A FACE TO FACE EVALUATION WAS PERFORMED  Nyoka Cowden 10/07/2013, 7:41 AM

## 2013-10-07 NOTE — Progress Notes (Signed)
Occupational Therapy Session Note  Patient Details  Name: RAJIV PARLATO MRN: [id number] Date of Birth: 09-22-1933  Today's Date: 10/07/2013 Time:  - 0830-0900  (61mn)    Short Term Goals: Week 3:  OT Short Term Goal 1 (Week 3): Pt will transfer on and off toilet with min A using grab bars and squat or stand pivot transfer. OT Short Term Goal 1 - Progress (Week 3): Met OT Short Term Goal 2 (Week 3): Pt will stand up from w/c with steady A only to manage clothing over hips. OT Short Term Goal 2 - Progress (Week 3): Met OT Short Term Goal 3 (Week 3): Pt will be able to stand to wash buttocks and perineal area with steady A. OT Short Term Goal 3 - Progress (Week 3): Met OT Short Term Goal 4 (Week 3): Pt will be able to don pants over feet using a reacher with min A. OT Short Term Goal 4 - Progress (Week 3): Met Week 4:  OT Short Term Goal 1 (Week 4): STGs = LTGs as pt's d/c date is less than a week  Skilled Therapeutic Interventions/Progress Updates:    Engaged in bed mobility, myotherapy to lumbar region.  Pt. Lying in bed upon OT arrival.  Pt refused to get out of bed but agreed to do stretches and myotherapy for pain. PAIN  was 8/10 and at end of session was 6/ 10.  Performed myotherapy to lumbar region with pressure points located and addressed.  Pt. Left in bed with call bell in reach.    Therapy Documentation Precautions:  Precautions Precautions: Fall Precaution Comments:  pain low back limits volitional movement Restrictions Weight Bearing Restrictions: No        ADL: ADL ADL Comments: refer to FIM Exercises: myotherapy, bed mobility      Therapy/Group: Individual Therapy  ELisa Roca 10/07/2013, 8:48 AM

## 2013-10-08 ENCOUNTER — Inpatient Hospital Stay (HOSPITAL_COMMUNITY): Payer: Medicare Other | Admitting: Occupational Therapy

## 2013-10-08 ENCOUNTER — Inpatient Hospital Stay (HOSPITAL_COMMUNITY): Payer: Medicare Other

## 2013-10-08 ENCOUNTER — Ambulatory Visit (HOSPITAL_COMMUNITY): Payer: Medicare Other | Admitting: Speech Pathology

## 2013-10-08 LAB — GLUCOSE, CAPILLARY
Glucose-Capillary: 224 mg/dL — ABNORMAL HIGH (ref 70–99)
Glucose-Capillary: 113 mg/dL — ABNORMAL HIGH (ref 70–99)
Glucose-Capillary: 190 mg/dL — ABNORMAL HIGH (ref 70–99)
Glucose-Capillary: 122 mg/dL — ABNORMAL HIGH (ref 70–99)
Glucose-Capillary: 188 mg/dL — ABNORMAL HIGH (ref 70–99)

## 2013-10-08 LAB — COMPREHENSIVE METABOLIC PANEL
ALT: 51 U/L (ref 0–53)
AST: 30 U/L (ref 0–37)
Albumin: 1.8 g/dL — ABNORMAL LOW (ref 3.5–5.2)
Alkaline Phosphatase: 936 U/L — ABNORMAL HIGH (ref 39–117)
Anion gap: 14 (ref 5–15)
BUN: 8 mg/dL (ref 6–23)
CO2: 21 mEq/L (ref 19–32)
Calcium: 8.1 mg/dL — ABNORMAL LOW (ref 8.4–10.5)
Chloride: 101 mEq/L (ref 96–112)
Creatinine, Ser: 0.73 mg/dL (ref 0.50–1.35)
GFR calc Af Amer: >90 mL/min (ref >90)
GFR calc non Af Amer: 87 mL/min — ABNORMAL LOW (ref >90)
Glucose, Bld: 151 mg/dL — ABNORMAL HIGH (ref 70–99)
Potassium: 4.2 mEq/L (ref 3.7–5.3)
Sodium: 136 mEq/L — ABNORMAL LOW (ref 137–147)
Total Bilirubin: 0.5 mg/dL (ref 0.3–1.2)
Total Protein: 6.9 g/dL (ref 6.0–8.3)

## 2013-10-08 MED ORDER — TRAMADOL HCL 50 MG PO TABS
50.0000 mg | ORAL_TABLET | Freq: Two times a day (BID) | ORAL | Status: DC | PRN
  Administered 2013-10-09 – 2013-10-10 (×2): 50 mg via ORAL
  Filled 2013-10-08 (×2): qty 1

## 2013-10-08 NOTE — Progress Notes (Signed)
Occupational Therapy Session Note  Patient Details  Name: KANI CHAUVIN MRN: [id number] Date of Birth: 04-Jan-1934  Today's Date: 10/08/2013 Time: 1003-1040 Time Calculation (min): 37 min - Pt missed 23 minutes as he refused to continue to participate due to back pain.  Short Term Goals: Week 1:  OT Short Term Goal 1 (Week 1): Pt will be able to sit EOB with mod A x 1 for 10 min to engage in UB bathing and dressing. OT Short Term Goal 1 - Progress (Week 1): Met OT Short Term Goal 2 (Week 1): Pt will be able to bathe his UB with min A. OT Short Term Goal 2 - Progress (Week 1): Met OT Short Term Goal 3 (Week 1): Pt will be able to don a tshirt with mod A. OT Short Term Goal 3 - Progress (Week 1): Met OT Short Term Goal 4 (Week 1): Pt will be able to transfer to w/c from bed with max A x1. OT Short Term Goal 4 - Progress (Week 1): Progressing toward goal OT Short Term Goal 5 (Week 1): Pt will demonstrate improved direction following during simple grooming with min cues. OT Short Term Goal 5 - Progress (Week 1): Met Week 2:  OT Short Term Goal 1 (Week 2): Groom:  complete 3 groom tasks while seated in w/c at sink OT Short Term Goal 1 - Progress (Week 2): Met OT Short Term Goal 2 (Week 2): Bath:  8/10 tasks sitting EOB with setup/cues OT Short Term Goal 2 - Progress (Week 2): Met OT Short Term Goal 3 (Week 2): LB Dressing:  Patient will be close supervision to donn feet into pants while sitting EOB. OT Short Term Goal 3 - Progress (Week 2): Progressing toward goal OT Short Term Goal 4 (Week 2): Bed Mobility:  Min assist for side lying to sit without bed rail and with HOB down to prepare for BADL at EOB. OT Short Term Goal 4 - Progress (Week 2): Met Week 3:  OT Short Term Goal 1 (Week 3): Pt will transfer on and off toilet with min A using grab bars and squat or stand pivot transfer. OT Short Term Goal 1 - Progress (Week 3): Met OT Short Term Goal 2 (Week 3): Pt will stand up from w/c with  steady A only to manage clothing over hips. OT Short Term Goal 2 - Progress (Week 3): Met OT Short Term Goal 3 (Week 3): Pt will be able to stand to wash buttocks and perineal area with steady A. OT Short Term Goal 3 - Progress (Week 3): Met OT Short Term Goal 4 (Week 3): Pt will be able to don pants over feet using a reacher with min A. OT Short Term Goal 4 - Progress (Week 3): Met Week 4:  OT Short Term Goal 1 (Week 4): STGs = LTGs as pt's d/c date is less than a week  Skilled Therapeutic Interventions/Progress Updates:    Pt sitting in w/c asking to get in bed. Pt encouraged to bathe and dress. He only wanted to do it in bed, but with a great deal of coaxing he completed from w/c and stood 2 x. Pt continually stated he wanted to be in bed. He completed S transfer. Attempted to have pt work on bed exercises, but he refused stating, "I am sorry but I want you to leave me alone".  Pt's bed alarm set with call light in reach.  Therapy Documentation Precautions:  Precautions Precautions: Fall Precaution  Comments:  pain low back limits volitional movement Restrictions Weight Bearing Restrictions: No  General Amount of Missed OT Time (min): 23 Minutes Vital Signs: Therapy Vitals Pulse Rate: 76 BP: 120/70 mmHg Patient Position (if appropriate): Sitting Pain: Pain Assessment Pain Assessment: No/denies pain Pain Score: 0-No pain ADL: ADL ADL Comments: refer to FIM  See FIM for current functional status  Therapy/Group: Individual Therapy  SAGUIER,JULIA 10/08/2013, 12:00 PM

## 2013-10-08 NOTE — Progress Notes (Signed)
Speech Language Pathology Daily Session Note  Patient Details  Name: Elijah [name] MRN: [id number] Date of Birth: 02/11/1934  Today's Date: 10/08/2013 Time: 1117-1202 Time Calculation (min): 45 min  Short Term Goals: Week 4: SLP Short Term Goal 1 (Week 4): Pt will tolerate his prescribed diet with no overt s/s of aspiration and min cuing for use of swallowing precautions.    SLP Short Term Goal 2 (Week 4): Pt will consistently sustain attention to semi-complex tasks for 15-20 minutes with Min cues SLP Short Term Goal 3 (Week 4): Pt will demonstrate semi-complex problem solving during self-care tasks with Min cues SLP Short Term Goal 4 (Week 4): Pt will utilize external memory aids to recall new and daily information with Mod cues  Skilled Therapeutic Interventions:  Pt was seen for skilled speech therapy targeting problem solving, sustained attention, and dysphagia management.  Pt was fully reclined in bed and asked the SLP for sips of thin liquids.  SLP reviewed and reinforced recommendations for sitting upright during all PO intake and facilitated safe swallowing with firm verbal cues to get pt to elevate the head of his bed to minimize risk of aspiration.  SLP completed skilled observations with presentations of pt's currently prescribed diet with no overt s/s of aspiration with dys 1 textures or thin liquids via straw sips.  SLP also facilitated session with a semi-complex generative naming task targeting thought organization and mental flexibility during which the pt required min assist verbal cues.  Continue per current plan of care.    FIM:  Comprehension Comprehension Mode: Auditory Comprehension: 5-Follows basic conversation/direction: With extra time/assistive device Expression Expression Mode: Verbal Expression: 5-Expresses basic 90% of the time/requires cueing < 10% of the time. Social Interaction Social Interaction: 4-Interacts appropriately 75 - 89% of the time - Needs  redirection for appropriate language or to initiate interaction. Problem Solving Problem Solving: 4-Solves basic 75 - 89% of the time/requires cueing 10 - 24% of the time Memory Memory: 3-Recognizes or recalls 50 - 74% of the time/requires cueing 25 - 49% of the time FIM - Eating Eating Activity: 5: Supervision/cues  Pain Pain Assessment Pain Assessment: Faces Faces Pain Scale: Hurts a little bit Pain Location: Back Pain Orientation: Lower Pain Descriptors / Indicators: Aching Pain Intervention(s): Repositioned  Therapy/Group: Individual Therapy  Jackalyn LombardNicole Ameri Cahoon, M.A. CCC-SLP  Nahiem Dredge, Melanee SpryNicole L 10/08/2013, 2:59 PM

## 2013-10-08 NOTE — Progress Notes (Signed)
Social Work Patient ID: Stormy CardJames A Fogg, male   DOB: 02/21/1934, 80 y.o.   MRN: [id number] Spoke with son-Alan to let him know several facilities have expressed interest in taking Dad.  [facility] if can choose. [facility] and have sent more information.  Doug to get back with worker.  Dan-PA reports wanting to check labs again Tomorrow and would be medically ready on Wed.  Will work on transfer and Recruitment consultantcontact [facility] if have not heard by later today.

## 2013-10-08 NOTE — Progress Notes (Signed)
Subjective/Complaints: Foley replaced.  Drinks liquids when offered Takes in 50-75% cal but doesn't like hospital food Review of Systems - limited secondary to mental status  Objective: Vital Signs: Blood pressure 116/68, pulse 72, temperature 98.4 F (36.9 C), temperature source Oral, resp. rate 19, weight 87.544 kg (193 lb), SpO2 96.00%. No results found. Results for orders placed during the hospital encounter of 09/11/13 (from the past 72 hour(s))  GLUCOSE, CAPILLARY     Status: Abnormal   Collection Time    10/05/13 11:41 AM      Result Value Ref Range   Glucose-Capillary 153 (*) 70 - 99 mg/dL   Comment 1 Notify RN    GLUCOSE, CAPILLARY     Status: Abnormal   Collection Time    10/05/13  4:46 PM      Result Value Ref Range   Glucose-Capillary 167 (*) 70 - 99 mg/dL  GLUCOSE, CAPILLARY     Status: Abnormal   Collection Time    10/05/13  9:09 PM      Result Value Ref Range   Glucose-Capillary 166 (*) 70 - 99 mg/dL  GLUCOSE, CAPILLARY     Status: Abnormal   Collection Time    10/06/13  7:29 AM      Result Value Ref Range   Glucose-Capillary 56 (*) 70 - 99 mg/dL  GLUCOSE, CAPILLARY     Status: None   Collection Time    10/06/13  7:56 AM      Result Value Ref Range   Glucose-Capillary 87  70 - 99 mg/dL  GLUCOSE, CAPILLARY     Status: Abnormal   Collection Time    10/06/13 11:41 AM      Result Value Ref Range   Glucose-Capillary 147 (*) 70 - 99 mg/dL  GLUCOSE, CAPILLARY     Status: Abnormal   Collection Time    10/06/13  4:42 PM      Result Value Ref Range   Glucose-Capillary 142 (*) 70 - 99 mg/dL   Comment 1 Notify RN    GLUCOSE, CAPILLARY     Status: Abnormal   Collection Time    10/06/13  8:33 PM      Result Value Ref Range   Glucose-Capillary 201 (*) 70 - 99 mg/dL  GLUCOSE, CAPILLARY     Status: None   Collection Time    10/07/13  7:39 AM      Result Value Ref Range   Glucose-Capillary 97  70 - 99 mg/dL  GLUCOSE, CAPILLARY     Status: Abnormal   Collection  Time    10/07/13 11:23 AM      Result Value Ref Range   Glucose-Capillary 178 (*) 70 - 99 mg/dL   Comment 1 Notify RN    GLUCOSE, CAPILLARY     Status: Abnormal   Collection Time    10/07/13  4:22 PM      Result Value Ref Range   Glucose-Capillary 224 (*) 70 - 99 mg/dL   Comment 1 Notify RN    GLUCOSE, CAPILLARY     Status: Abnormal   Collection Time    10/07/13  8:29 PM      Result Value Ref Range   Glucose-Capillary 216 (*) 70 - 99 mg/dL  GLUCOSE, CAPILLARY     Status: Abnormal   Collection Time    10/08/13  7:06 AM      Result Value Ref Range   Glucose-Capillary 113 (*) 70 - 99 mg/dL     HEENT:  normal Cardio: irregular and no murmurs Resp: CTA B/L and unlabored GI: BS positive and large bilateral inguinal mass, nontender Extremity:  No Edema  Neuro: alert, improved insight and awareness , Abnormal Motor 4/5 in BUE,  3+/5 in BLE, Abnormal FMC Ataxic/ dec FMC, Dysarthric and Apraxic. More alert Musc/Skel:  Mild pain with palpation over lower lumbar spine particularly on the left Gen NAD Skin:  Heels red, buttocks without open area, inc stool  Assessment/Plan: 1. Functional deficits secondary to bilateral cerebral infarct embolic which require 3+ hours per day of interdisciplinary therapy in a comprehensive inpatient rehab setting. Physiatrist is providing close team supervision and 24 hour management of active medical problems listed below. Physiatrist and rehab team continue to assess barriers to discharge/monitor patient progress toward functional and medical goals. Tent d/c in am Needs GI f/u repeat CMET today, reduce tramadol Family training will need to include encouraging fluid and caloric intake, add ensure FIM: FIM - Bathing Bathing Steps Patient Completed: Chest;Right Arm;Left Arm;Abdomen;Right upper leg;Left upper leg;Front perineal area;Buttocks Bathing: 4: Min-Patient completes 8-9 3f10 parts or 75+ percent  FIM - Upper Body Dressing/Undressing Upper body  dressing/undressing steps patient completed: Thread/unthread right sleeve of pullover shirt/dresss;Put head through opening of pull over shirt/dress;Thread/unthread left sleeve of pullover shirt/dress;Pull shirt over trunk Upper body dressing/undressing: 5: Supervision: Safety issues/verbal cues FIM - Lower Body Dressing/Undressing Lower body dressing/undressing steps patient completed: Thread/unthread right pants leg;Thread/unthread left pants leg;Pull pants up/down Lower body dressing/undressing: 3: Mod-Patient completed 50-74% of tasks  FIM - Toileting Toileting steps completed by patient: Adjust clothing prior to toileting;Adjust clothing after toileting;Performs perineal hygiene Toileting: 4: Steadying assist  FIM - TRadio producerDevices: Bedside commode Toilet Transfers: 4-From toilet/BSC: Min A (steadying Pt. > 75%);4-To toilet/BSC: Min A (steadying Pt. > 75%)  FIM - Bed/Chair Transfer Bed/Chair Transfer Assistive Devices: Bed rails;Arm rests Bed/Chair Transfer: 4: Chair or W/C > Bed: Min A (steadying Pt. > 75%);4: Bed > Chair or W/C: Min A (steadying Pt. > 75%)  FIM - Locomotion: Wheelchair Distance: 60 Locomotion: Wheelchair: 2: Travels 50 - 149 ft with supervision, cueing or coaxing FIM - Locomotion: Ambulation Locomotion: Ambulation Assistive Devices: WAdministratorAmbulation/Gait Assistance: 4: Min assist Locomotion: Ambulation: 1: Travels less than 50 ft with minimal assistance (Pt.>75%)  Comprehension Comprehension Mode: Auditory Comprehension: 5-Follows basic conversation/direction: With extra time/assistive device  Expression Expression Mode: Verbal Expression: 5-Expresses basic needs/ideas: With extra time/assistive device  Social Interaction Social Interaction: 4-Interacts appropriately 75 - 89% of the time - Needs redirection for appropriate language or to initiate interaction.  Problem Solving Problem Solving: 4-Solves basic  75 - 89% of the time/requires cueing 10 - 24% of the time  Memory Memory: 3-Recognizes or recalls 50 - 74% of the time/requires cueing 25 - 49% of the time  Medical Problem List and Plan:  1. Functional deficits secondary to acute cerebral white matter bilateral infarct  2. DVT Prophylaxis/Anticoagulation:off Eliquis per GI due to liver failure, change to ASA until ok to resume Eliquis. Off lovenox due to bleeding 3. Pain Management: added ultram, lidoderm patches helpful for low back. 4. Dysphagia with decreased nutritional storage. Dysphagia 1 nectar liquids.   -appetite inconsistent but a little better  -on megace but refuses will d/c 5. Neuropsych: This patient is not capable of making decisions on his own behalf.  6. Hypertension/atrial fibrillation. Norvasc 10 mg daily, hydralazine 10 mg 4 times a day, metoprolol 25 mg every 8 hours.  7 ID/methicillin sensitive staph aureus. Continue cefazolin 2 g intravenously every 8 hours x4 weeks total initiated 08/29/2013.   8. Diabetes mellitus with peripheral neuropathy.      -Levemir   25 units each bedtime.   -sugars much improved although po intake is inadequate 9. Integumentary:improved d/c foley 10.  Inguinal mass, bilateral hernia, obviously chronic and not incarcerated, given recent CVA, no elective surg recommended at this time 11.  Elevated alk phos: off Lipitor,  ? lipitor vs autoimmune with increased IgG and IgA, liver ultrasound OK, LFTs up again will ask GI to re eval prior to D/C            12.  Urinary retention: replaced foley  13. Anemia: recent iron tests indicate anemia of chronic disease.    -hgb 7.7 monitor for symptoms,  14.  Low back pain ,reviewed CT abd/'pelvis, no fractures, has lumbar facet degenerative changes at mulltiple levels no central spinal stenosis,pain in sacral area, likely degenerative with exacerbation secondary to sedentary activity level, cont heat, lidoderm, added tramadol but will need to reduce dose  secondary to elevated LFTs  -some improvement     LOS (Days) 27 A FACE TO FACE EVALUATION WAS PERFORMED  Thamara Leger E 10/08/2013, 7:42 AM

## 2013-10-08 NOTE — Progress Notes (Addendum)
Physical Therapy Session Note  Patient Details  Name: Elijah CardJames A Guandique MRN: [id number] Date of Birth: 02/25/1934  Today's Date: 10/08/2013 Time: 0903-1003; 1300-1330 Time Calculation (min): 60 min , 30 min  Short Term Goals: Week 4:  PT Short Term Goal 1 (Week 4): = LTGs due to LOS  Skilled Therapeutic Interventions/Progress Updates:   Tx 1:  No c/o pain.  Bed mobility with tactile cues; bed> w/c squat pivot to L with min assist needed for sitting before hips cleared armrest.  neuromuscular re-education for self stretching for bil hip alignment/ hamstring stretching in sitting  Gait with Rw with min assist to stand , x 50' with max cues to continue.  No c/o back pain or dizziness, just fatigue.  5 steps 2 rails with min assist for eccentric control/safety, step through pattern, without cues.    Pt stated he needed to use toilet for BM, urgently.  Stand pivot transfer using wall bar; standing balance to doff clothing with min guard assist.  Pt stood for cleansing by staff, x 3 minutes.  Stand pivot to sit with min assist.  Therapist passed pt to NT andf RN; pt has small open area R buttock.  Tx 2:  Pt declined getting OOB due to back pain, rated 6/10, premedicated.  He did participate in bedside tx for strengthening trunk and bil LEs.  2 x 10 each in sidelying: R and L  Hip flexion,  R and L hip abduction with straight knees and flexed knees, hip ext with knee flexed; in supine with bil knees flexed: 1 x 10 cervical flexion, 1 x 3 bridging .  Pt chose to rest in L sidelying.  K pad arranged so that it did not cover Lidoderm patches. All needs within reach. Call bell on.  Discussed d/c with CSW.  She is seeking placement due to lack of family plan to take pt home.  Pt does not remember this although she has informed him; he believes he is d/cing home and that his wife will be able to care for him.  Therapy Documentation Precautions:  Precautions Precautions: Fall Precaution  Comments:  pain low back limits volitional movement Restrictions Weight Bearing Restrictions: No   Vital Signs: Therapy Vitals Pulse Rate: 76 BP: 120/70 mmHg Patient Position (if appropriate): Sitting  Ambulation Ambulation/Gait Assistance: 4: Min assist     See FIM for current functional status  Therapy/Group: Individual Therapy  Vaniya Augspurger 10/08/2013, 12:28 PM

## 2013-10-09 ENCOUNTER — Inpatient Hospital Stay (HOSPITAL_COMMUNITY): Payer: Medicare Other | Admitting: *Deleted

## 2013-10-09 ENCOUNTER — Inpatient Hospital Stay (HOSPITAL_COMMUNITY): Payer: Medicare Other | Admitting: Speech Pathology

## 2013-10-09 ENCOUNTER — Inpatient Hospital Stay (HOSPITAL_COMMUNITY): Payer: Medicare Other | Admitting: Occupational Therapy

## 2013-10-09 DIAGNOSIS — Z5189 Encounter for other specified aftercare: Secondary | ICD-10-CM

## 2013-10-09 DIAGNOSIS — I6340 Cerebral infarction due to embolism of unspecified cerebral artery: Secondary | ICD-10-CM

## 2013-10-09 LAB — GLUCOSE, CAPILLARY
Glucose-Capillary: 100 mg/dL — ABNORMAL HIGH (ref 70–99)
Glucose-Capillary: 157 mg/dL — ABNORMAL HIGH (ref 70–99)
Glucose-Capillary: 161 mg/dL — ABNORMAL HIGH (ref 70–99)
Glucose-Capillary: 157 mg/dL — ABNORMAL HIGH (ref 70–99)

## 2013-10-09 NOTE — Plan of Care (Signed)
Problem: RH SKIN INTEGRITY Goal: RH STG SKIN FREE OF INFECTION/BREAKDOWN Pt will remain free of infection and further skin breakdown with max assistance  Outcome: Progressing Areas of breakdown are healing, no evidence of new breakdown

## 2013-10-09 NOTE — Progress Notes (Signed)
Occupational Therapy Session Note  Patient Details  Name: Elijah [name] MRN: [id number] Date of Birth: 11/16/1933  Today's Date: 10/09/2013 Time: 0915-1000 and 1105-1120 Time Calculation (min): 45 min and 15 min  Short Term Goals: Week 1:  OT Short Term Goal 1 (Week 1): Pt will be able to sit EOB with mod A x 1 for 10 min to engage in UB bathing and dressing. OT Short Term Goal 1 - Progress (Week 1): Met OT Short Term Goal 2 (Week 1): Pt will be able to bathe his UB with min A. OT Short Term Goal 2 - Progress (Week 1): Met OT Short Term Goal 3 (Week 1): Pt will be able to don a tshirt with mod A. OT Short Term Goal 3 - Progress (Week 1): Met OT Short Term Goal 4 (Week 1): Pt will be able to transfer to w/c from bed with max A x1. OT Short Term Goal 4 - Progress (Week 1): Progressing toward goal OT Short Term Goal 5 (Week 1): Pt will demonstrate improved direction following during simple grooming with min cues. OT Short Term Goal 5 - Progress (Week 1): Met Week 2:  OT Short Term Goal 1 (Week 2): Groom:  complete 3 groom tasks while seated in w/c at sink OT Short Term Goal 1 - Progress (Week 2): Met OT Short Term Goal 2 (Week 2): Bath:  8/10 tasks sitting EOB with setup/cues OT Short Term Goal 2 - Progress (Week 2): Met OT Short Term Goal 3 (Week 2): LB Dressing:  Patient will be close supervision to donn feet into pants while sitting EOB. OT Short Term Goal 3 - Progress (Week 2): Progressing toward goal OT Short Term Goal 4 (Week 2): Bed Mobility:  Min assist for side lying to sit without bed rail and with HOB down to prepare for BADL at EOB. OT Short Term Goal 4 - Progress (Week 2): Met Week 3:  OT Short Term Goal 1 (Week 3): Pt will transfer on and off toilet with min A using grab bars and squat or stand pivot transfer. OT Short Term Goal 1 - Progress (Week 3): Met OT Short Term Goal 2 (Week 3): Pt will stand up from w/c with steady A only to manage clothing over hips. OT Short Term  Goal 2 - Progress (Week 3): Met OT Short Term Goal 3 (Week 3): Pt will be able to stand to wash buttocks and perineal area with steady A. OT Short Term Goal 3 - Progress (Week 3): Met OT Short Term Goal 4 (Week 3): Pt will be able to don pants over feet using a reacher with min A. OT Short Term Goal 4 - Progress (Week 3): Met Week 4:  OT Short Term Goal 1 (Week 4): STGs = LTGs as pt's d/c date is less than a week  LTG of dressing downgraded to mod A and tub transfer goal discontinued as pt has a PICC line.    Skilled Therapeutic Interventions/Progress Updates:    Visit 1: Pain: " my back is hurting like it always does". Pt seen for ADL retraining focusing on activity tolerance and standing balance. Pt was in recliner declined in a bed position stating he needed to get into the bed. Pt was encouraged to complete B/D first. Pt was able to stand with supervision. Pt continues to have difficulty donning socks and the sock aid does not work that well for him. Pt could not tolerate continuing therapy due to fatigue and  back pain. Pt transferred back to bed with Supervision. Pt in bed with call light and bed alarm set.   Visit 2: no c/o pain. Pt in bed and willing to exercise from bed only. Pt worked on UE strengthening exercises using orange (level 2) theraband for shoulder retraction, diagonal reaching, shoulder presses, and elbow flexion. Pt participated well. Pt in bed with all needs in place.  Therapy Documentation Precautions:  Precautions Precautions: Fall Precaution Comments: LBP Restrictions Weight Bearing Restrictions: No    Vital Signs: Therapy Vitals BP: 131/71 mmHg  ADL: ADL ADL Comments: refer to FIM  See FIM for current functional status  Therapy/Group: Individual Therapy  SAGUIER,JULIA 10/09/2013, 10:59 AM

## 2013-10-09 NOTE — Progress Notes (Addendum)
NUTRITION FOLLOW UP  Intervention:   -Continue Ensure Pudding TID, each supplement provides 170 kcal and 4 grams of protein, recommend providing this with medications.   -D/C Prostat BID  Nutrition Dx:   Inadequate oral intake related to CVA as evidenced by 10% meal intake, ongoing   Goal:   Pt to meet >/= 90% of their estimated nutrition needs  Monitor:   Weight trends, Po intake, acceptance and toleration of supplements, labs  Assessment:   80 year old right-handed Caucasian male with history of diabetes mellitus with peripheral neuropathy. Patient independent and active prior to admission. [facility] with slurred speech as well as diffuse weakness.   7/14: Diet advanced to Dysphagia 2, nectar-thick liquid diet. Intake remains fair with 0-65% intake. Patient reports that he is hungry, but has difficulty chewing foods due to lack of teeth/missing dentures. After discussing with RN, it seems that it is this rather than decreased appetite that is causing the poor intake.   7/21 Pt's diet downgraded to Dysphagia 1 with nectar-thickened liquids. Meal completion is recorded as 35-100%. Pt is tolerating diet, and intake is improving. Pt reports that he is eating Ensure Pudding, but refuses to take Prostat. Megace d/c'd per pt preference.  CBG's: 100-190  Height: Ht Readings from Last 1 Encounters:  09/11/13 6\' 2"  (1.88 m)    Weight Status:   Wt Readings from Last 1 Encounters:  10/03/13 193 lb (87.544 kg)    Re-estimated needs:  Kcal: 2100-2300 Protein: 110-120 g Fluid: 2.1-2.3 L/day  Skin: excoriated area on buttocks/sacrum, deep tissue injury right heel   Diet Order: Dysphagia   Intake/Output Summary (Last 24 hours) at 10/09/13 1058 Last data filed at 10/09/13 0900  Gross per 24 hour  Intake    480 ml  Output   2250 ml  Net  -1770 ml    Last BM: 7/20   Labs:   Recent Labs Lab 10/03/13 0950 10/08/13 0820  NA  134* 136*  K 4.6 4.2  CL 100 101  CO2 17* 21  BUN 18 8  CREATININE 1.00 0.73  CALCIUM 8.2* 8.1*  GLUCOSE 159* 151*    CBG (last 3)   Recent Labs  10/08/13 1626 10/08/13 2101 10/09/13 0728  GLUCAP 122* 188* 100*    Scheduled Meds: . amLODipine  10 mg Oral Daily  . antiseptic oral rinse  15 mL Mouth Rinse q12n4p  . aspirin  81 mg Oral Daily  . chlorhexidine  15 mL Mouth Rinse BID  . feeding supplement (ENSURE)  1 Container Oral TID BM  . feeding supplement (PRO-STAT SUGAR FREE 64)  30 mL Oral BID WC  . hydrALAZINE  10 mg Oral 4 times per day  . hydrocerin   Topical BID  . insulin aspart  0-15 Units Subcutaneous TID WC  . insulin aspart  0-5 Units Subcutaneous QHS  . insulin detemir  20 Units Subcutaneous QHS  . lidocaine  2 patch Transdermal Q24H  . megestrol  400 mg Oral BID  . metoprolol tartrate  25 mg Oral 3 times per day    Continuous Infusions:   Ebbie LatusHaley Hawkins RD, LDN

## 2013-10-09 NOTE — Progress Notes (Signed)
Physical Therapy Session Note  Patient Details  Name: Elijah [name] MRN: [id number] Date of Birth: 08/10/1933  Today's Date: 10/09/2013 Time: [id number] and 15:03-15:36 (33min)  Time Calculation (min): 45 min  Short Term Goals: Week 4:  PT Short Term Goal 1 (Week 4): = LTGs due to LOS  Skilled Therapeutic Interventions/Progress Updates:  First tx focused on functional mobility, gait and stairs, and NMR via forced use, manual facilitation, and verbal cues Provided extensive education on health and wellness as it realates to CVA risk reduction, including nutrition, hydration, diet, and exercise. Pt verbalizes understanding and asks for water over diet coke.   Performed bed mobility and transfers with S and cues for safety/efficiency.  WC propulsion x100' with S, limited by fatigue this morning, needing multiple rest breaks.   Performed gait with RW x25' with min steadying assist overall and encouragement to go father, but unable.   Seated HS stretch bil x272min each with manual facilitation for muscle relaxation.   Stair training x5 steps with 2 rails and min A for steadying. Pt needed seated rest after each task with education and encouragement, still limited by back pain with little relief from attempted interventions.  Encouraged pt to stay OOB back in room for activity tolerance, making comfortable in recliner with all needs in reach.   Second tx, pt needed max encouragement for OOB participation, limited by back pain. Initiated tx with therex in supine for pain relief, stretching, and strengthening. Pt is still very tight in HS groups bilaterally as well as hip flexors.  Performed bridging, HS and heel cord stretching in supine, hip flexor stretch and clamshells in sidelying, and HS stretch again in sitting.   Performed gait x50' with max encouragement with RW and min steadying assist. Decreased knee and hip ext noted in stance, and pt able to improve 50% with verbal and tactile cues.   Pt declined WC mobility and staying up OOB this afternoon. Reminded pt of importance of OOB activity on health and independence and he verbalized understanding.  Pt left in bed with K-pad, bed alarm, and all needs in reach.      Therapy Documentation Precautions:  Precautions Precautions: Fall Precaution Comments: LBP Restrictions Weight Bearing Restrictions: No General:   Vital Signs: Therapy Vitals BP: 131/71 mmHg Pain: 7/10 LBP, K-pad and modified tx.    Locomotion : Ambulation Ambulation/Gait Assistance: 4: Min Lawyerassist Wheelchair Mobility Distance: 100  See FIM for current functional status  Therapy/Group: Individual Therapy Clydene Lamingole Mykayla Brinton, PT, DPT   10/09/2013, 11:13 AM

## 2013-10-09 NOTE — Progress Notes (Signed)
Speech Language Pathology Daily Session Note  Patient Details  Name: Elijah CardJames A Mauger MRN: [id number] Date of Birth: 03/25/1933  Today's Date: 10/09/2013 Time: [id number] Time Calculation (min): 42 min  Short Term Goals: Week 4: SLP Short Term Goal 1 (Week 4): Pt will tolerate his prescribed diet with no overt s/s of aspiration and min cuing for use of swallowing precautions.    SLP Short Term Goal 2 (Week 4): Pt will consistently sustain attention to semi-complex tasks for 15-20 minutes with Min cues SLP Short Term Goal 3 (Week 4): Pt will demonstrate semi-complex problem solving during self-care tasks with Min cues SLP Short Term Goal 4 (Week 4): Pt will utilize external memory aids to recall new and daily information with Mod cues  Skilled Therapeutic Interventions:  Pt was seen for skilled speech therapy targeting problem solving and working memory.  Upon arrival, pt was partially reclined in bed with complaints of fatigue and requested to do therapy while in bed.  Pt positioned himself to a more upright posture by elevating head of bed to facilitate improved arousal with initial supervision cues.   SLP facilitated the session with a semi-complex generative naming task targeting working memory, mental flexibility, and thought organization.  Pt initially required min assist verbal cues for problem solving due to decreased mental flexibility, mod assist verbal cues for working memory of auditory information; however, as session progressed he became increasingly more fatigued to the point where eventually required max assist for redirection to task and arousal.  Continue per current plan of care.    FIM:  Comprehension Comprehension Mode: Auditory Comprehension: 5-Follows basic conversation/direction: With extra time/assistive device Expression Expression Mode: Verbal Expression: 5-Expresses basic 90% of the time/requires cueing < 10% of the time. Social Interaction Social Interaction:  4-Interacts appropriately 75 - 89% of the time - Needs redirection for appropriate language or to initiate interaction. Problem Solving Problem Solving: 4-Solves basic 75 - 89% of the time/requires cueing 10 - 24% of the time Memory Memory: 3-Recognizes or recalls 50 - 74% of the time/requires cueing 25 - 49% of the time  Pain Pain Assessment Pain Assessment: No/denies pain  Therapy/Group: Individual Therapy  Jackalyn LombardNicole Aashrith Eves, M.A. CCC-SLP   Author [place], Melanee SpryNicole L 10/09/2013, 4:45 PM

## 2013-10-09 NOTE — Progress Notes (Signed)
Subjective/Complaints: Pt aware of SNF placement pending  Review of Systems - limited secondary to mental status  Objective: Vital Signs: Blood pressure 127/67, pulse 73, temperature 98 F (36.7 C), temperature source Oral, resp. rate 18, weight 87.544 kg (193 lb), SpO2 98.00%. No results found. Results for orders placed during the hospital encounter of 09/11/13 (from the past 72 hour(s))  GLUCOSE, CAPILLARY     Status: None   Collection Time    10/06/13  7:56 AM      Result Value Ref Range   Glucose-Capillary 87  70 - 99 mg/dL  GLUCOSE, CAPILLARY     Status: Abnormal   Collection Time    10/06/13 11:41 AM      Result Value Ref Range   Glucose-Capillary 147 (*) 70 - 99 mg/dL  GLUCOSE, CAPILLARY     Status: Abnormal   Collection Time    10/06/13  4:42 PM      Result Value Ref Range   Glucose-Capillary 142 (*) 70 - 99 mg/dL   Comment 1 Notify RN    GLUCOSE, CAPILLARY     Status: Abnormal   Collection Time    10/06/13  8:33 PM      Result Value Ref Range   Glucose-Capillary 201 (*) 70 - 99 mg/dL  GLUCOSE, CAPILLARY     Status: None   Collection Time    10/07/13  7:39 AM      Result Value Ref Range   Glucose-Capillary 97  70 - 99 mg/dL  GLUCOSE, CAPILLARY     Status: Abnormal   Collection Time    10/07/13 11:23 AM      Result Value Ref Range   Glucose-Capillary 178 (*) 70 - 99 mg/dL   Comment 1 Notify RN    GLUCOSE, CAPILLARY     Status: Abnormal   Collection Time    10/07/13  4:22 PM      Result Value Ref Range   Glucose-Capillary 224 (*) 70 - 99 mg/dL   Comment 1 Notify RN    GLUCOSE, CAPILLARY     Status: Abnormal   Collection Time    10/07/13  8:29 PM      Result Value Ref Range   Glucose-Capillary 216 (*) 70 - 99 mg/dL  GLUCOSE, CAPILLARY     Status: Abnormal   Collection Time    10/08/13  7:06 AM      Result Value Ref Range   Glucose-Capillary 113 (*) 70 - 99 mg/dL  COMPREHENSIVE METABOLIC PANEL     Status: Abnormal   Collection Time    10/08/13  8:20 AM       Result Value Ref Range   Sodium 136 (*) 137 - 147 mEq/L   Potassium 4.2  3.7 - 5.3 mEq/L   Chloride 101  96 - 112 mEq/L   CO2 21  19 - 32 mEq/L   Glucose, Bld 151 (*) 70 - 99 mg/dL   BUN 8  6 - 23 mg/dL   Creatinine, Ser 0.73  0.50 - 1.35 mg/dL   Calcium 8.1 (*) 8.4 - 10.5 mg/dL   Total Protein 6.9  6.0 - 8.3 g/dL   Albumin 1.8 (*) 3.5 - 5.2 g/dL   AST 30  0 - 37 U/L   ALT 51  0 - 53 U/L   Alkaline Phosphatase 936 (*) 39 - 117 U/L   Total Bilirubin 0.5  0.3 - 1.2 mg/dL   GFR calc non Af Amer 87 (*) >90 mL/min  GFR calc Af Amer >90  >90 mL/min   Comment: (NOTE)     The eGFR has been calculated using the CKD EPI equation.     This calculation has not been validated in all clinical situations.     eGFR's persistently <90 mL/min signify possible Chronic Kidney     Disease.   Anion gap 14  5 - 15  GLUCOSE, CAPILLARY     Status: Abnormal   Collection Time    10/08/13 11:02 AM      Result Value Ref Range   Glucose-Capillary 190 (*) 70 - 99 mg/dL  GLUCOSE, CAPILLARY     Status: Abnormal   Collection Time    10/08/13  4:26 PM      Result Value Ref Range   Glucose-Capillary 122 (*) 70 - 99 mg/dL   Comment 1 Notify RN    GLUCOSE, CAPILLARY     Status: Abnormal   Collection Time    10/08/13  9:01 PM      Result Value Ref Range   Glucose-Capillary 188 (*) 70 - 99 mg/dL     HEENT: normal Cardio: irregular and no murmurs Resp: CTA B/L and unlabored GI: BS positive and large bilateral inguinal mass, nontender Extremity:  No Edema  Neuro: alert, improved insight and awareness , Abnormal Motor 4/5 in BUE,  3+/5 in BLE, Abnormal FMC Ataxic/ dec FMC, Dysarthric and Apraxic. More alert Musc/Skel:  Mild pain with palpation over lower lumbar spine particularly on the left Gen NAD Skin:  Heels red, buttocks without open area, inc stool  Assessment/Plan: 1. Functional deficits secondary to bilateral cerebral infarct embolic which require 3+ hours per day of interdisciplinary  therapy in a comprehensive inpatient rehab setting. Physiatrist is providing close team supervision and 24 hour management of active medical problems listed below. Physiatrist and rehab team continue to assess barriers to discharge/monitor patient progress toward functional and medical goals.  FIM: FIM - Bathing Bathing Steps Patient Completed: Chest;Right Arm;Left Arm;Abdomen;Right upper leg;Left upper leg;Front perineal area;Buttocks Bathing: 4: Min-Patient completes 8-9 10f10 parts or 75+ percent  FIM - Upper Body Dressing/Undressing Upper body dressing/undressing steps patient completed: Thread/unthread right sleeve of pullover shirt/dresss;Put head through opening of pull over shirt/dress;Thread/unthread left sleeve of pullover shirt/dress;Pull shirt over trunk Upper body dressing/undressing: 5: Supervision: Safety issues/verbal cues FIM - Lower Body Dressing/Undressing Lower body dressing/undressing steps patient completed: Thread/unthread right pants leg;Thread/unthread left pants leg;Pull pants up/down Lower body dressing/undressing: 3: Mod-Patient completed 50-74% of tasks  FIM - Toileting Toileting steps completed by patient: Adjust clothing prior to toileting;Performs perineal hygiene Toileting Assistive Devices: Grab bar or rail for support Toileting: 3: Mod-Patient completed 2 of 3 steps  FIM - TRadio producerDevices: BRecruitment consultantTransfers: 4-From toilet/BSC: Min A (steadying Pt. > 75%);4-To toilet/BSC: Min A (steadying Pt. > 75%)  FIM - Bed/Chair Transfer Bed/Chair Transfer Assistive Devices: Bed rails;Arm rests Bed/Chair Transfer: 5: Supine > Sit: Supervision (verbal cues/safety issues);4: Bed > Chair or W/C: Min A (steadying Pt. > 75%)  FIM - Locomotion: Wheelchair Distance: 60 Locomotion: Wheelchair: 5: Travels 150 ft or more: maneuvers on rugs and over door sills with supervision, cueing or coaxing FIM - Locomotion:  Ambulation Locomotion: Ambulation Assistive Devices: WAdministratorAmbulation/Gait Assistance: 4: Min assist Locomotion: Ambulation: 2: Travels 50 - 149 ft with minimal assistance (Pt.>75%)  Comprehension Comprehension Mode: Auditory Comprehension: 5-Follows basic conversation/direction: With extra time/assistive device  Expression Expression Mode: Verbal Expression: 5-Expresses basic 90%  of the time/requires cueing < 10% of the time.  Social Interaction Social Interaction: 4-Interacts appropriately 75 - 89% of the time - Needs redirection for appropriate language or to initiate interaction.  Problem Solving Problem Solving: 4-Solves basic 75 - 89% of the time/requires cueing 10 - 24% of the time  Memory Memory: 3-Recognizes or recalls 50 - 74% of the time/requires cueing 25 - 49% of the time  Medical Problem List and Plan:  1. Functional deficits secondary to acute cerebral white matter bilateral infarct  2. DVT Prophylaxis/Anticoagulation:off Eliquis per GI due to liver failure, change to ASA until ok to resume Eliquis. Off lovenox due to bleeding 3. Pain Management: added ultram, lidoderm patches helpful for low back. 4. Dysphagia with decreased nutritional storage. Dysphagia 1 nectar liquids.   -appetite inconsistent but a little better  -on megace but refuses will d/c 5. Neuropsych: This patient is not capable of making decisions on his own behalf.  6. Hypertension/atrial fibrillation. Norvasc 10 mg daily, hydralazine 10 mg 4 times a day, metoprolol 25 mg every 8 hours.   7 ID/methicillin sensitive staph aureus. Continue cefazolin 2 g intravenously every 8 hours x4 weeks total initiated 08/29/2013.   8. Diabetes mellitus with peripheral neuropathy.      -Levemir   25 units each bedtime.   -sugars much improved although po intake is inadequate 9. Integumentary:improved d/c foley 10.  Inguinal mass, bilateral hernia, obviously chronic and not incarcerated, given recent CVA,  no elective surg recommended at this time 11.  Elevated alk phos: off Lipitor,  ? lipitor vs autoimmune with increased IgG and IgA, liver ultrasound OK, LFTs improved again except AlkP still up will ask GI f/u as outpt          12.  Urinary retention: replaced foley  13. Anemia: recent iron tests indicate anemia of chronic disease.    - 14.  Low back pain ,reviewed CT abd/'pelvis, no fractures, has lumbar facet degenerative changes at mulltiple levels no central spinal stenosis,pain in sacral area, likely degenerative with exacerbation secondary to sedentary activity level, cont heat, lidoderm, added tramadol but will need to reduce dose secondary to elevated LFTs  -some improvement     LOS (Days) 28 A FACE TO FACE EVALUATION WAS PERFORMED  Elijah [name] E 10/09/2013, 7:51 AM

## 2013-10-09 NOTE — Progress Notes (Signed)
Social Work Patient ID: Elijah CardJames A [name], male   DOB: 09/06/1933, 80 y.o.   MRN: [id number] [facility] will not know until tomorrow if will have a bed.  Spoke with Alan-son and pt would like to see if they could offer a bed. [facility]  [facility]  Aware pt is ready and Needs to be transferred to the next venue of care.

## 2013-10-10 ENCOUNTER — Inpatient Hospital Stay (HOSPITAL_COMMUNITY): Payer: Medicare Other | Admitting: Occupational Therapy

## 2013-10-10 ENCOUNTER — Inpatient Hospital Stay (HOSPITAL_COMMUNITY): Payer: Medicare Other | Admitting: Speech Pathology

## 2013-10-10 ENCOUNTER — Inpatient Hospital Stay (HOSPITAL_COMMUNITY): Payer: Medicare Other

## 2013-10-10 LAB — GLUCOSE, CAPILLARY
Glucose-Capillary: 95 mg/dL (ref 70–99)
Glucose-Capillary: 159 mg/dL — ABNORMAL HIGH (ref 70–99)

## 2013-10-10 MED ORDER — TRAMADOL HCL 50 MG PO TABS: 50.0000 mg | ORAL_TABLET | Freq: Two times a day (BID) | ORAL | Status: DC | PRN

## 2013-10-10 MED ORDER — LIDOCAINE 5 % EX PTCH: 2.0000 | MEDICATED_PATCH | CUTANEOUS | Status: DC

## 2013-10-10 NOTE — Progress Notes (Signed)
Social Work Patient ID: Elijah CardJames A [name], male   DOB: 03/17/1934, 80 y.o.   MRN: [id number] Spoke with [facility] who reported they do have a bed today-later this afternoon.  Have informed pt, son and team regarding transfer this afternoon. Dan-PA aware and will work on discharge summary.  Family to meet pt over at facility at 2;00 pm.

## 2013-10-10 NOTE — Progress Notes (Signed)
Subjective/Complaints: Pt. Asking about SNF   Review of Systems - limited secondary to mental status  Objective: Vital Signs: Blood pressure 137/69, pulse 70, temperature 97.5 F (36.4 C), temperature source Oral, resp. rate 18, weight 87.544 kg (193 lb), SpO2 98.00%. No results found. Results for orders placed during the hospital encounter of 09/11/13 (from the past 72 hour(s))  GLUCOSE, CAPILLARY     Status: Abnormal   Collection Time    10/07/13 11:23 AM      Result Value Ref Range   Glucose-Capillary 178 (*) 70 - 99 mg/dL   Comment 1 Notify RN    GLUCOSE, CAPILLARY     Status: Abnormal   Collection Time    10/07/13  4:22 PM      Result Value Ref Range   Glucose-Capillary 224 (*) 70 - 99 mg/dL   Comment 1 Notify RN    GLUCOSE, CAPILLARY     Status: Abnormal   Collection Time    10/07/13  8:29 PM      Result Value Ref Range   Glucose-Capillary 216 (*) 70 - 99 mg/dL  GLUCOSE, CAPILLARY     Status: Abnormal   Collection Time    10/08/13  7:06 AM      Result Value Ref Range   Glucose-Capillary 113 (*) 70 - 99 mg/dL  COMPREHENSIVE METABOLIC PANEL     Status: Abnormal   Collection Time    10/08/13  8:20 AM      Result Value Ref Range   Sodium 136 (*) 137 - 147 mEq/L   Potassium 4.2  3.7 - 5.3 mEq/L   Chloride 101  96 - 112 mEq/L   CO2 21  19 - 32 mEq/L   Glucose, Bld 151 (*) 70 - 99 mg/dL   BUN 8  6 - 23 mg/dL   Creatinine, Ser 0.73  0.50 - 1.35 mg/dL   Calcium 8.1 (*) 8.4 - 10.5 mg/dL   Total Protein 6.9  6.0 - 8.3 g/dL   Albumin 1.8 (*) 3.5 - 5.2 g/dL   AST 30  0 - 37 U/L   ALT 51  0 - 53 U/L   Alkaline Phosphatase 936 (*) 39 - 117 U/L   Total Bilirubin 0.5  0.3 - 1.2 mg/dL   GFR calc non Af Amer 87 (*) >90 mL/min   GFR calc Af Amer >90  >90 mL/min   Comment: (NOTE)     The eGFR has been calculated using the CKD EPI equation.     This calculation has not been validated in all clinical situations.     eGFR's persistently <90 mL/min signify possible Chronic Kidney      Disease.   Anion gap 14  5 - 15  GLUCOSE, CAPILLARY     Status: Abnormal   Collection Time    10/08/13 11:02 AM      Result Value Ref Range   Glucose-Capillary 190 (*) 70 - 99 mg/dL  GLUCOSE, CAPILLARY     Status: Abnormal   Collection Time    10/08/13  4:26 PM      Result Value Ref Range   Glucose-Capillary 122 (*) 70 - 99 mg/dL   Comment 1 Notify RN    GLUCOSE, CAPILLARY     Status: Abnormal   Collection Time    10/08/13  9:01 PM      Result Value Ref Range   Glucose-Capillary 188 (*) 70 - 99 mg/dL  GLUCOSE, CAPILLARY     Status: Abnormal  Collection Time    10/09/13  7:28 AM      Result Value Ref Range   Glucose-Capillary 100 (*) 70 - 99 mg/dL   Comment 1 Notify RN    GLUCOSE, CAPILLARY     Status: Abnormal   Collection Time    10/09/13 11:46 AM      Result Value Ref Range   Glucose-Capillary 157 (*) 70 - 99 mg/dL   Comment 1 Notify RN    GLUCOSE, CAPILLARY     Status: Abnormal   Collection Time    10/09/13  4:42 PM      Result Value Ref Range   Glucose-Capillary 161 (*) 70 - 99 mg/dL   Comment 1 Notify RN    GLUCOSE, CAPILLARY     Status: Abnormal   Collection Time    10/09/13  9:08 PM      Result Value Ref Range   Glucose-Capillary 157 (*) 70 - 99 mg/dL  GLUCOSE, CAPILLARY     Status: None   Collection Time    10/10/13  7:31 AM      Result Value Ref Range   Glucose-Capillary 95  70 - 99 mg/dL     HEENT: normal Cardio: irregular and no murmurs Resp: CTA B/L and unlabored GI: BS positive and large bilateral inguinal mass, nontender Extremity:  No Edema  Neuro: alert, improved insight and awareness , Abnormal Motor 4/5 in BUE,  3+/5 in BLE, Abnormal FMC Ataxic/ dec FMC, Dysarthric and Apraxic. More alert Musc/Skel:  Mild pain with palpation over lower lumbar spine particularly on the left Gen NAD Skin:  Heels red, buttocks without open area, inc stool  Assessment/Plan: 1. Functional deficits secondary to bilateral cerebral infarct embolic which  require 3+ hours per day of interdisciplinary therapy in a comprehensive inpatient rehab setting. Physiatrist is providing close team supervision and 24 hour management of active medical problems listed below. Physiatrist and rehab team continue to assess barriers to discharge/monitor patient progress toward functional and medical goals. Team conference today please see physician documentation under team conference tab, met with team face-to-face to discuss problems,progress, and goals. Formulized individual treatment plan based on medical history, underlying problem and comorbidities. FIM: FIM - Bathing Bathing Steps Patient Completed: Chest;Right Arm;Left Arm;Abdomen;Right upper leg;Left upper leg;Front perineal area;Buttocks Bathing: 4: Min-Patient completes 8-9 72f10 parts or 75+ percent  FIM - Upper Body Dressing/Undressing Upper body dressing/undressing steps patient completed: Thread/unthread right sleeve of pullover shirt/dresss;Put head through opening of pull over shirt/dress;Thread/unthread left sleeve of pullover shirt/dress;Pull shirt over trunk Upper body dressing/undressing: 5: Supervision: Safety issues/verbal cues FIM - Lower Body Dressing/Undressing Lower body dressing/undressing steps patient completed: Thread/unthread right pants leg;Thread/unthread left pants leg;Pull pants up/down Lower body dressing/undressing: 3: Mod-Patient completed 50-74% of tasks  FIM - Toileting Toileting steps completed by patient: Adjust clothing prior to toileting;Performs perineal hygiene Toileting Assistive Devices: Grab bar or rail for support Toileting: 3: Mod-Patient completed 2 of 3 steps  FIM - TRadio producerDevices: BRecruitment consultantTransfers: 4-From toilet/BSC: Min A (steadying Pt. > 75%);4-To toilet/BSC: Min A (steadying Pt. > 75%)  FIM - Bed/Chair Transfer Bed/Chair Transfer Assistive Devices: Arm rests Bed/Chair Transfer: 5: Supine > Sit:  Supervision (verbal cues/safety issues);5: Sit > Supine: Supervision (verbal cues/safety issues);5: Bed > Chair or W/C: Supervision (verbal cues/safety issues)  FIM - Locomotion: Wheelchair Distance: 100 Locomotion: Wheelchair: 0: Activity did not occur FIM - Locomotion: Ambulation Locomotion: Ambulation Assistive Devices: WAdministratorAmbulation/Gait Assistance:  4: Min assist Locomotion: Ambulation: 2: Travels 50 - 149 ft with minimal assistance (Pt.>75%)  Comprehension Comprehension Mode: Auditory Comprehension: 5-Follows basic conversation/direction: With extra time/assistive device  Expression Expression Mode: Verbal Expression: 5-Expresses basic 90% of the time/requires cueing < 10% of the time.  Social Interaction Social Interaction: 4-Interacts appropriately 75 - 89% of the time - Needs redirection for appropriate language or to initiate interaction.  Problem Solving Problem Solving: 4-Solves basic 75 - 89% of the time/requires cueing 10 - 24% of the time  Memory Memory: 3-Recognizes or recalls 50 - 74% of the time/requires cueing 25 - 49% of the time  Medical Problem List and Plan:  1. Functional deficits secondary to acute cerebral white matter bilateral infarct  2. DVT Prophylaxis/Anticoagulation:off Eliquis per GI due to liver failure, change to ASA until ok to resume Eliquis. Off lovenox due to bleeding 3. Pain Management: added ultram, lidoderm patches helpful for low back. 4. Dysphagia with decreased nutritional storage. Dysphagia 1 nectar liquids.   -appetite inconsistent but a little better  -on megace but refuses will d/c 5. Neuropsych: This patient is not capable of making decisions on his own behalf.  6. Hypertension/atrial fibrillation. Norvasc 10 mg daily, hydralazine 10 mg 4 times a day, metoprolol 25 mg every 8 hours.   7 ID/methicillin sensitive staph aureus. Continue cefazolin 2 g intravenously every 8 hours x4 weeks total initiated 08/29/2013.   8.  Diabetes mellitus with peripheral neuropathy.      -Levemir   25 units each bedtime.   -sugars much improved although po intake is inadequate 9. Integumentary:improved d/c foley 10.  Inguinal mass, bilateral hernia, obviously chronic and not incarcerated, given recent CVA, no elective surg recommended at this time 11.  Elevated alk phos: off Lipitor,  ? lipitor vs autoimmune with increased IgG and IgA, liver ultrasound OK, LFTs improved again except AlkP still up will ask GI f/u as outpt          12.  Urinary retention: replaced foley  13. Anemia: recent iron tests indicate anemia of chronic disease.    - 14.  Low back pain ,reviewed CT abd/'pelvis, no fractures, has lumbar facet degenerative changes at mulltiple levels no central spinal stenosis,pain in sacral area, likely degenerative with exacerbation secondary to sedentary activity level, cont heat, lidoderm, added tramadol but will need to reduce dose secondary to elevated LFTs  -some improvement     LOS (Days) 29 A FACE TO FACE EVALUATION WAS PERFORMED  KIRSTEINS,ANDREW E 10/10/2013, 8:56 AM

## 2013-10-10 NOTE — Discharge Summary (Signed)
NAMEMarland [name]  LANDAN, FEDIE NO.:  [id number]  MEDICAL RECORD NO.:  [id number]  LOCATION:  4W03C                        FACILITY:  [facility]  PHYSICIAN:  Erick Colace, M.D.DATE OF BIRTH:  1933-07-04  DATE OF ADMISSION:  09/11/2013 DATE OF DISCHARGE:  10/10/2013                              DISCHARGE SUMMARY   DISCHARGE DIAGNOSES: 1. Functional deficits secondary to acute cerebral white matter     bilateral infarction. 2. Sequential compression devices for DVT prophylaxis. 3. Dysphagia. 4. Hypertension with atrial fibrillation. 5. Methicillin-sensitive Staph aureus with antibiotics completed. 6. Diabetes mellitus with peripheral neuropathy. 7. Bilateral inguinal hernias, chronic, not incarcerated. 8. Elevated liver function studies, improving. 9. Chronic anemia.  HISTORY OF PRESENT ILLNESS:  This is a 80 year old right-handed Caucasian male with history of diabetes mellitus, peripheral neuropathy, and bilateral inguinal hernias who was independent and active prior to admission.  Admitted to [facility] on August 27, 2013, with slurred speech as well as diffuse weakness.  His heart rate was in the 130s.  EKG showed frequent PVCs, junctional rhythm, as well as tachycardia.  Troponin levels negative.  CT of the head negative for acute changes.  MRI of the brain showed multiple areas of acute infarction in the cerebral white matter bilaterally.  Echocardiogram with ejection fraction of 60% without thrombi.  Chest x-ray negative. Carotid Dopplers with no significant ICA stenosis.  Initially placed on aspirin for CVA prophylaxis, changed to Eliquis secondary to runs of atrial fibrillation.  An attempted TEE was aborted due to lot of bleeding in the mouth due to the probe that needed cautery.  His Eliquis was initially held and later resumed after bleeding subsided.  HOSPITAL COURSE:  Hyponatremia, placed on normal saline IV fluids. Sodium improved  to 139.  Blood cultures methicillin-sensitive Staph aureus, placed on Unasyn per Infectious Disease x4 weeks from the date of August 29, 2013.  Maintained on a dysphagia 1 nectar thick liquid diet. Physical and occupational therapy ongoing.  The patient was admitted for comprehensive rehab program.  PAST MEDICAL HISTORY:  See discharge diagnoses.  SOCIAL HISTORY:  Married.  Lives with his wife.  FUNCTIONAL HISTORY:  Prior to admission, independent and active.  FUNCTIONAL STATUS:  Upon admission to rehab services was max total assist for bed mobility, max total assist with activities of daily living.  PHYSICAL EXAMINATION:  VITAL SIGNS:  Blood pressure 137/69, pulse 80, temperature 98, respirations 18. GENERAL:  This was an alert male.  Speech was dysarthric, but intelligible.  He had some inattention to the left. LUNGS:  Clear to auscultation. CARDIAC:  Rate controlled. ABDOMEN:  Soft, nontender.  Good bowel sounds.  Noted excoriated areas about his sacrum and buttocks as well as chronic bilateral inguinal hernias.  REHABILITATION HOSPITAL COURSE:  The patient was admitted to inpatient rehab services with therapies initiated on a 3-hour daily basis consisting of physical therapy, occupational therapy, speech therapy, and rehabilitation nursing.  The following issues were addressed during the patient's rehabilitation stay.  Pertaining to Mr. Pietrzyk's functional deficits secondary to acute cerebral white matter, bilateral infarction has remained stable, maintained on aspirin therapy.  The patient had been on Eliquis initially for stroke  prevention.  This was discontinued due to bouts of bleeding as well as elevated liver function studies, question autoimmune with liver ultrasound being unremarkable. The patient did well with aspirin therapy, his heart rate remained well controlled on Norvasc, hydralazine, as well as Lopressor.  Diabetes mellitus, peripheral neuropathy, insulin  therapy as advised.  Noted chronic non-incarcerated bilateral hernias, no elective surgery recommended at this time.  He completed a course of cefazolin for methicillin-sensitive Staph aureus per Infectious Disease remaining afebrile.  Diabetes again remained well controlled also.  He did have some low back pain.  Review of CT abdomen showed no fractures.  There was some facet degenerative changes, no stenosis.  The patient had been on Lovenox for DVT prophylaxis.  This was discontinued due to some occasional nose bleeds that did subside after Lovenox discontinued.  The patient received weekly collaborative interdisciplinary team conferences to discuss estimated length of stay, family teaching, and any barriers to discharge.  The patient performed 50% ambulation with a rolling walker max encouragement, minimal assist required.  Strength endurance with slow progressive gains, activities of daily living, retraining focused on activity tolerance.  The patient was able to stand with supervision, continued to have some difficulty donning his socks and shoes needing assistance.  Family limited on physical assistance, it was felt skilled nursing facility was needed with bed becoming available on October 10, 2013.  During his rehab stay, he was followed by Speech Therapy for dysphagia, currently on a dysphagia 1 thin liquid diet.  Monitor closely for hydration.  DISCHARGE MEDICATIONS:  At the time of dictation included: 1. Norvasc 10 mg p.o. daily. 2. Aspirin 81 mg p.o. daily. 3. Hydralazine 10 mg p.o. every 6 hours. 4. Levemir insulin 20 units subcutaneously daily at bedtime. 5. Lidoderm patch, 2 patches change every 24 hours. 6. Lopressor 25 mg p.o. t.i.d. 7. Naphcon ophthalmic solution 0.1% one drop both eyes 4 times daily     as needed. 8. Ultram 50 mg p.o. every 12 hours as needed severe pain.  SPECIAL INSTRUCTIONS:  Was for barrier cream to buttocks and perineum, apply as needed when  turning or cleaning to maintain skin integrity. The patient will follow up with Dr. Claudette LawsAndrew Kirsteins at the outpatient rehab center as needed, Dr. Jeani HawkingPatrick Hung, Gastroenterology Services as needed for any elevations in liver function studies that did continue to improve.  His diet was a dysphagia 1 thin liquid diet.     Mariam Dollaraniel Shizuo Biskup, P.A.   ______________________________ Erick ColaceAndrew E. Kirsteins, M.D.    DA/MEDQ  D:  10/10/2013  T:  10/10/2013  Job:  [id number]  cc:   Jordan HawksPatrick D. Elnoria HowardHung, MD Dewaine Oatsenny Tate

## 2013-10-10 NOTE — Progress Notes (Signed)
Occupational Therapy Session Note  Patient Details  Name: Elijah [name] MRN: [id number] Date of Birth: [birth year]  Today's Date: 10/10/2013 Time: 0900-0955 Time Calculation (min): 55 min  Short Term Goals: Week 1:  OT Short Term Goal 1 (Week 1): Pt will be able to sit EOB with mod A x 1 for 10 min to engage in UB bathing and dressing. OT Short Term Goal 1 - Progress (Week 1): Met OT Short Term Goal 2 (Week 1): Pt will be able to bathe his UB with min A. OT Short Term Goal 2 - Progress (Week 1): Met OT Short Term Goal 3 (Week 1): Pt will be able to don a tshirt with mod A. OT Short Term Goal 3 - Progress (Week 1): Met OT Short Term Goal 4 (Week 1): Pt will be able to transfer to w/c from bed with max A x1. OT Short Term Goal 4 - Progress (Week 1): Progressing toward goal OT Short Term Goal 5 (Week 1): Pt will demonstrate improved direction following during simple grooming with min cues. OT Short Term Goal 5 - Progress (Week 1): Met Week 2:  OT Short Term Goal 1 (Week 2): Groom:  complete 3 groom tasks while seated in w/c at sink OT Short Term Goal 1 - Progress (Week 2): Met OT Short Term Goal 2 (Week 2): Bath:  8/10 tasks sitting EOB with setup/cues OT Short Term Goal 2 - Progress (Week 2): Met OT Short Term Goal 3 (Week 2): LB Dressing:  Patient will be close supervision to donn feet into pants while sitting EOB. OT Short Term Goal 3 - Progress (Week 2): Progressing toward goal OT Short Term Goal 4 (Week 2): Bed Mobility:  Min assist for side lying to sit without bed rail and with HOB down to prepare for BADL at EOB. OT Short Term Goal 4 - Progress (Week 2): Met Week 3:  OT Short Term Goal 1 (Week 3): Pt will transfer on and off toilet with min A using grab bars and squat or stand pivot transfer. OT Short Term Goal 1 - Progress (Week 3): Met OT Short Term Goal 2 (Week 3): Pt will stand up from w/c with steady A only to manage clothing over hips. OT Short Term Goal 2 - Progress (Week  3): Met OT Short Term Goal 3 (Week 3): Pt will be able to stand to wash buttocks and perineal area with steady A. OT Short Term Goal 3 - Progress (Week 3): Met OT Short Term Goal 4 (Week 3): Pt will be able to don pants over feet using a reacher with min A. OT Short Term Goal 4 - Progress (Week 3): Met Week 4:  OT Short Term Goal 1 (Week 4): STGs = LTGs as pt's d/c date is less than a week  Skilled Therapeutic Interventions/Progress Updates:    Pt seen for BADL retraining of B/D with a focus on activity tolerance and balance. Pt participated very well today with minimal c/o pain or asking to stop therapy. Pt was able to ambulated with RW from bed to chair at sink and completed his B/D using a reacher to don pants. Good improvement in balance. He then was taken to therapy gym where he transferred on and off the mat with S. He completed several UE strengthening exercises and then stated he wanted to rest his back in bed.  Pt's w/c was place at the door of his room and pt was given the RW. He ambulated  to his bed and got into bed without A.  This is pt's last day on rehab. Reviewed with pt the excellent progress he has made and encouraged him to continue to work at the next facility.  Pt's bed alarm set and call light in place.   Therapy Documentation Precautions:  Precautions Precautions: Fall Precaution Comments: LBP Restrictions Weight Bearing Restrictions: No   Pain: Pain Assessment Pain Assessment: No/denies pain Pain Score: 0-No pain Pain Type: Chronic pain Pain Location: Back Pain Orientation: Lower Pain Descriptors / Indicators: Aching Pain Frequency: Constant Pain Intervention(s): Medication (See eMAR) ADL: ADL ADL Comments: refer to FIM  See FIM for current functional status  Therapy/Group: Individual Therapy  Aili Casillas 10/10/2013, 11:48 AM

## 2013-10-10 NOTE — Progress Notes (Signed)
Pt. Discharged to SNF [facility]) via amulance at [phone].  All personal items in tow.

## 2013-10-10 NOTE — Progress Notes (Signed)
[facility]  Patient is scheduled to be discharged this afternoon via ambulance.

## 2013-10-10 NOTE — Discharge Summary (Signed)
Discharge summary job # [id number]

## 2013-10-10 NOTE — Progress Notes (Signed)
Social Work Discharge Note Discharge Note  The overall goal for the admission was met for:   Discharge location: No-[facility]  Length of Stay: No-29 DAYS  Discharge activity level: Yes-MIN LEVEL  Home/community participation: Yes  Services provided included: MD, RD, PT, OT, SLP, RN, CM, TR, Pharmacy and SW  Financial Services: Private Insurance: [facility]  Follow-up services arranged: Other: NHP  Comments (or additional information):PT NEEDS TO GET MORE REHAB AND GET TO A HIGHER LEVEL BEFORE RETURNING HOME.  Patient/Family verbalized understanding of follow-up arrangements: Yes  Individual responsible for coordination of the follow-up plan: ALAN-SON AND WIFE  Confirmed correct DME delivered: Elease Hashimoto 10/10/2013    Elease Hashimoto

## 2013-10-10 NOTE — Progress Notes (Signed)
Occupational Therapy Discharge Summary  Patient Details  Name: Elijah [name] MRN: [id number] Date of Birth: 1933-12-30  Today's Date: 10/10/2013  Patient has met 9 of 9 long term goals due to improved activity tolerance, improved balance, postural control, functional use of  RIGHT upper and RIGHT lower extremity, improved attention, improved awareness and improved coordination.  Patient to discharge at overall supervision to  [place] level. He has made dramatic progress. At time of admission, pt was extremely confused and needed total A x 2.    Patient's care partner unavailable to provide the necessary physical and cognitive assistance at discharge.    Reasons goals not met: n/a  Recommendation:  Patient will benefit from ongoing skilled OT services in skilled nursing facility setting to continue to advance functional skills in the area of BADL.  Equipment: No equipment provided  Reasons for discharge: treatment goals met  Patient/family agrees with progress made and goals achieved: Yes  OT Discharge  ADL ADL ADL Comments: Overall min A level Vision/Perception  Vision- Assessment Vision Assessment?: Yes Eye Alignment: Within Functional Limits Ocular Range of Motion: Within Functional Limits Alignment/Gaze Preference: Within Defined Limits Visual Fields: No apparent deficits Perception Comments: WFL  Cognition Overall Cognitive Status: Impaired/Different from baseline Arousal/Alertness: Awake/alert Orientation Level: Oriented X4 Attention: Sustained Sustained Attention: Impaired Memory: Impaired Behaviors: Poor frustration tolerance Safety/Judgment: Impaired Sensation Sensation Light Touch: Appears Intact Stereognosis: Appears Intact Hot/Cold: Appears Intact Proprioception: Appears Intact Coordination Gross Motor Movements are Fluid and Coordinated: No Fine Motor Movements are Fluid and Coordinated: No Coordination and Movement Description: limb apraxia not  apparent bil LEs Finger Nose Finger Test: slow movement, but accurate Heel Shin Test: decreased excursion bil Motor  Motor Motor - Discharge Observations: Pt no longer exhibits apraxia. Continues to have generalized weakness, but it has improved overall. Mobility    supervision to min A with transfers Trunk/Postural Assessment  Cervical Assessment Cervical Assessment: Within Functional Limits Thoracic Assessment Thoracic Assessment: Exceptions to [facility] (kyphotic ) Thoracic PROM Overall Thoracic PROM Comments: kyphotic  Balance Static Sitting Balance Static Sitting - Level of Assistance: 5: Stand by assistance Dynamic Sitting Balance Dynamic Sitting - Level of Assistance: 5: Stand by assistance Dynamic Standing Balance Dynamic Standing - Level of Assistance: 4: Min assist Dynamic Standing - Comments: During functional self care tasks. Extremity/Trunk Assessment RUE Strength RUE Overall Strength Comments: 4/5 LUE Strength LUE Overall Strength Comments: 4/5  See FIM for current functional status  Roxana Lai 10/10/2013, 11:58 AM

## 2013-10-10 NOTE — Progress Notes (Addendum)
Physical Therapy Discharge Summary  Patient Details  Name: Elijah [name] MRN: [id number] Date of Birth: [birth year]  Today's Date: 10/10/2013 Time: 5110-2111 Time Calculation (min): 45 min, missed 30 min in PM due to low back pain, fatigue from earlier tx, per pt  Patient has met 9 of 10 long term goals due to improved activity tolerance, improved balance, improved postural control, increased strength, increased range of motion, decreased pain, ability to compensate for deficits, functional use of  right upper extremity, right lower extremity, left upper extremity and left lower extremity, improved attention, improved awareness and improved coordination.  Patient to discharge at an ambulatory level [place].   Patient's care partner unavailable to provide the necessary physical assistance at discharge.  Elijah [name] has good potential to continue to progress in mobility and locomotion.  He is most limited by low back pain due to chronic OA.  Reasons goals not met: car transfer not applicable due to d/c to SNF, not known until day before d/c  Recommendation:  Patient will benefit from ongoing skilled PT services in skilled nursing facility setting to continue to advance safe functional mobility, address ongoing impairments in strength, balance, activity tolerance, flexibility, pain, functional mobility, locomotion, and minimize fall risk.  Equipment: No equipment provided  Reasons for discharge: treatment goals met and discharge from hospital  Patient/family agrees with progress made and goals achieved: Yes  PT Discharge Precautions/Restrictions Restrictions Weight Bearing Restrictions: No   Pain Pain Assessment Pain Assessment: No/denies pain Pain Score: 0-No pain Vision/Perception  Vision - Assessment Eye Alignment: Within Functional Limits Ocular Range of Motion: Within Functional Limits Alignment/Gaze Preference: Within Defined Limits Perception Comments: WFL   Cognition Overall Cognitive Status: Impaired/Different from baseline Arousal/Alertness: Awake/alert Orientation Level: Oriented X4 Attention: Sustained Sustained Attention: Impaired Memory: Impaired Behaviors: Poor frustration tolerance Safety/Judgment: Impaired Sensation Sensation Light Touch: Appears Intact Stereognosis: Appears Intact Hot/Cold: Appears Intact Proprioception: Appears Intact Coordination Gross Motor Movements are Fluid and Coordinated: No Fine Motor Movements are Fluid and Coordinated: No Coordination and Movement Description: limb apraxia not apparent bil LEs Finger Nose Finger Test: slow movement, but accurate Heel Shin Test: decreased excursion bil Motor  Motor Motor: Motor impersistence Motor - Skilled Clinical Observations: generalized weakness Motor - Discharge Observations: Pt no longer exhibits apraxia. Continues to have generalized weakness, but it has improved overall.  Mobility Bed Mobility Bed Mobility:  (modified independent for all) Transfers Transfers: Yes Stand Pivot Transfers: 4: Min assist Stand Pivot Transfer Details: Verbal cues for precautions/safety Squat Pivot Transfers: 4: Min assist Locomotion  Ambulation Ambulation: Yes Ambulation/Gait Assistance: 4: Min assist Ambulation Distance (Feet): 50 Feet Assistive device: Rolling walker Gait Gait: Yes Gait Pattern: Impaired Gait Pattern: Trunk flexed;Narrow base of support;Decreased hip/knee flexion - left;Decreased hip/knee flexion - right;Decreased trunk rotation;Antalgic (bil hips ER) Gait velocity: decreased Stairs / Additional Locomotion Stairs: Yes Stairs Assistance: 4: Min assist Stairs Assistance Details: Tactile cues for posture;Tactile cues for weight shifting Stair Management Technique: Two rails Number of Stairs: 5 Height of Stairs: 7 Wheelchair Mobility Wheelchair Mobility: Yes Wheelchair Assistance: 5: Careers information officer: Both upper  extremities Wheelchair Parts Management: Needs assistance Distance: 150  Trunk/Postural Assessment  Cervical Assessment Cervical Assessment: Within Functional Limits Thoracic Assessment Thoracic Assessment: Exceptions to [facility] (kyphotic ) Thoracic PROM Overall Thoracic PROM Comments: kyphotic Lumbar Assessment Lumbar Assessment: Exceptions to [facility] (rests in posterior pelvic tilt) Postural Control Postural Control: Deficits on evaluation Protective Responses: delayed and inadequate balance strategies  Balance Static Sitting Balance Static  Sitting - Level of Assistance: 5: Stand by assistance Dynamic Sitting Balance Dynamic Sitting - Level of Assistance: 5: Stand by assistance Dynamic Standing Balance Dynamic Standing - Level of Assistance: 4: Min assist Dynamic Standing - Comments: During functional self care tasks. Extremity Assessment  RUE Strength RUE Overall Strength Comments: 4/5 LUE Strength LUE Overall Strength Comments: 4/5 RLE Assessment RLE Assessment: Exceptions to [facility] RLE Strength RLE Overall Strength Comments: grossly in sitting: 3-/5 hip flexion, 4/5 hip abd/add, 4=/5 knee ext and 5/5/ ankle DF (tight hamstrings and heel cord; ankle DF 0-5 degrees) LLE Assessment LLE Assessment: Exceptions to [facility] (tight heel cord and hamstrings; ankle DF 0-5 degrees) LLE Strength LLE Overall Strength Comments: grossly in sitting: 4/5 hip flex/abd/add; 5/5 knee ext. 4/5 ankle DF mostly toe ext;  See FIM for current functional status  Elijah [name] 10/10/2013, 12:33 PM

## 2013-10-11 NOTE — Progress Notes (Signed)
Speech Language Pathology Discharge Summary  Patient Details  Name: Elijah [name] MRN: [id number] Date of Birth: 04/13/1933  Today's Date: 10/11/2013   Patient has met 7 of 7 long term goals.  Patient to discharge at [facility] level.  Reasons goals not met: n/a   Clinical Impression/Discharge Summary:  Pt made functional gains while inpatient and has discharged having met 7 of 7 long term goals due to improved awareness, attention, problem solving, speech intelligibility, swallowing function, and use of external aids for recall of daily information.  Pt discharged from CIR at an overall min assist level for cognitive tasks and is tolerating his current diet with supervision cues to minimize overt s/s of aspiration with dys 1 textures and thin liquids.  Suspect that pt will be ready for a diet advancement once his dentures have been replaced and he is able to more comfortably masticate solid consistencies.  Pt education is complete at this time and pt has discharged to SNF where it is recommended that he receive further speech therapy in order to maximize functional independence for swallowing and cognitive function and reduce burden of care upon discharge.     Care Partner:  Caregiver Able to Provide Assistance: Other (comment) (SNF)     Recommendation:  Skilled Nursing facility;24 hour supervision/assistance  Rationale for SLP Follow Up: Maximize cognitive function and independence;Reduce caregiver burden;Maximize swallowing safety   Equipment: none recommended by SLP   Reasons for discharge: Discharged from hospital   Patient/Family Agrees with Progress Made and Goals Achieved: Yes   See FIM for current functional status  Windell Moulding, M.A. CCC-SLP  Khiana Camino, Selinda Orion 10/11/2013, 9:20 AM

## 2014-07-13 NOTE — Consult Note (Signed)
PATIENT NAME:  Elijah [name], Alroy A MR#:  [id number] DATE OF BIRTH:  [birth year]  DATE OF CONSULTATION:  08/28/2013  REFERRING PHYSICIAN:   CONSULTING PHYSICIAN:  Laurier NancyShaukat A. Renda Pohlman, MD  INDICATION FOR CONSULTATION: Junctional tachycardia.   HISTORY OF PRESENT ILLNESS: This is a 81 year old white male with a past medical history of diabetes who presented to the hospital yesterday with slurred speech and weakness. His blood sugar was 130. He was noticed to have frequent PVCs with possible junctional rhythm, thus I was asked to evaluate the patient. The patient had some chest pain, according to the patient, but not now. He is feeling better, but he is not very poor coherent. He is eating breakfast. He denies shortness of breath.   PAST MEDICAL HISTORY: Diabetes, hypertension, hyperlipidemia. No history of CVA.   FAMILY HISTORY: Positive for hypertension.   SOCIAL HISTORY: Smokes cigars. No alcohol abuse.   MEDICATIONS: Metformin.   PHYSICAL EXAMINATION: GENERAL: He is alert and oriented.  VITAL SIGNS: His blood pressure is 123/63, respirations 20, pulse 80, temperature 98.1, and saturation 95.  NECK: No JVD.  LUNGS: Clear.  HEART: Irregular. Normal S1, S2. No audible murmur.  ABDOMEN: Soft, nontender. Positive bowel sounds.  EXTREMITIES: No pedal edema.  NEUROLOGIC: No focal deficits.   DIAGNOSTIC DATA: His EKG shows A-fib at 111 beats per minute, right bundle branch block, nonspecific ST-T changes.   His troponins were slightly elevated at 0.09.  His carotid Doppler had no significant disease.   Chest x-ray was unremarkable.   Creatinine is mildly elevated.  Echo showed EF 60%, moderately dilated left atrium, mildly dilated right atrium, mild mitral valve regurgitation   ASSESSMENT AND PLAN: The patient has atrial fibrillation with no evidence of junctional rhythm, some occasional premature ventricular complexes. Advise consideration of anticoagulation instead of aspirin. He does not  have any significant carotid disease. There is no thrombi on echocardiogram, but he may have had a transient ischemic attack. I advise anticoagulation with possible Coumadin versus Xarelto.   Thank you very much for the referral.  ____________________________ Laurier NancyShaukat A. Savera Donson, MD sak:sb D: 08/28/2013 09:53:55 ET T: 08/28/2013 10:03:55 ET JOB#: [id number]  cc: Laurier NancyShaukat A. Daelyn Pettaway, MD, <Dictator> Laurier NancySHAUKAT A Marquasia Schmieder MD ELECTRONICALLY SIGNED 10/03/2013 9:21

## 2014-07-13 NOTE — Consult Note (Signed)
PATIENT NAME:  Elijah Elijah [name], Elijah Elijah [name] MR#:  [id number] DATE OF BIRTH:  Jan 10, 1934  DATE OF CONSULTATION:  09/06/2013  REFERRING PHYSICIAN:   CONSULTING PHYSICIAN:  Dr. Renae GlossWieting   REASON FOR CONSULTATION: Laceration of the oral cavity.   HISTORY OF PRESENT ILLNESS: The patient is Elijah [name] 81 year old gentleman who underwent an attempted TEE earlier this morning and sustained Elijah [name] laceration to the back of his throat during the procedure.  Procedure had to be aborted secondary to difficult placement of the TEE and I was called by his hospitalist for evaluation given the persistent bleeding.  Of note, the patient is on blood thinner.  Patient is currently admitted for possible transient ischemic attack versus CVA.   PAST MEDICAL HISTORY: Hypertension, hyperlipidemia, attempted TEE.   ALLERGIES: No known drug allergies.   SOCIAL HISTORY: Patient occasionally smokes.  Denies any alcohol or drugs.  Lives with his wife.  FAMILY HISTORY:  Hypertension.   REVIEW OF SYSTEMS: CONSTITUTIONAL:  Positive for cavity bleeding. The patient is very lethargic after having PE and I am unable to obtain any other review of systems. The patient denies any significant pain.  GENERAL: The patient is lethargic, in no acute distress, supine in bed sleeping. He is arousable and does respond to his name and to commands.  EARS: External ears are clear.  NOSE: Clear anteriorly.  ORAL CAVITY AND OROPHARYNX: Reveals Elijah [name] laceration of the posterior soft palate and anterior tonsillar pillar with an evulsion of some of the mucosa with active bleeding from the entire laceration site.   PROCEDURE:  Cauterization of oral cavity bleeding, control of oral cavity hemorrhage.   PREPROCEDURE DIAGNOSIS: Oral cavity hemorrhage.  POSTPROCEDURE DIAGNOSIS:  Oral cavity hemorrhage.   DESCRIPTION OF PROCEDURE: After verbal consent was obtained from the patient as well as the patient's son over the phone,  the topical Hurricaine spray was placed in the  patient's oral cavity and silver nitrate cauterization was applied to the laceration site and with 4 sticks of silver nitrate cautery. This demonstrated Elijah [name] significant improvement of bleeding; however, after observation, the patient was repeat evaluated.   Once he got on the floor, bleeding was continued to be noted from the medial and superior aspect of the laceration. Then,  2 mL of 1% lidocaine with 1:200,000 epinephrine was injected into the medial superior part of the laceration and another stick of silver nitrate cauterization was applied.  This resulted in complete control of the hemorrhage.   IMPRESSION: Oral cavity hemorrhage from laceration during TEE.   PLAN: I discussed my findings with the patient as well as the nurse. I would recommend patient be evaluated by speech pathology. Given the sedated nature of his current state, I am unsure whether this is his baseline or secondary to the TEE. Once cleared by speech, he is cleared by ENT for further for evaluation.    ____________________________ Kyung Ruddreighton C. Kaleth Koy, MD ccv:dd D: 09/06/2013 17:35:00 ET T: 09/06/2013 19:55:50 ET JOB#: [id number]  cc: Kyung Ruddreighton C. Arlinda Barcelona, MD, <Dictator> Kyung RuddREIGHTON C Jakhari Space MD ELECTRONICALLY SIGNED 09/12/2013 8:27

## 2014-07-13 NOTE — Discharge Summary (Signed)
PATIENT NAME:  Elijah [name], Elijah [name] MR#:  [id number] DATE OF BIRTH:  1934/03/15  DATE OF ADMISSION:  08/27/2013 DATE OF DISCHARGE:  09/09/2013  PRIMARY CARE Stephani Janak: Jillene Bucks. Arlana Pouch, MD  ADMITTING DIAGNOSES: Slurred speech, junctional tachycardia.   DISCHARGE DIAGNOSES: 1.  Acute cerebrovascular accident with atrial fibrillation.  2.  Acute respiratory failure from aspiration.  3.  Severe hypernatremia.  4.  Sepsis with methicillin-sensitive staph aureus bacteremia.  5.  Laceration of a mouth after a transesophageal echocardiogram attempt.  6.  Diabetes.  7.  Hyperlipidemia.  8.  Acute renal failure.  9.  Thrombocytopenia.  10.  Hypertension.  11.  Hypokalemia.   HOSPITAL COURSE: The patient was admitted on 08/27/2013. The patient came in with episode of slurred speech and weakness, admitted for possible transient ischemic attack. Initially did have junctional tachycardia and was started on metoprolol. In terms of his problems:  1.  Acute cerebrovascular accident with atrial fibrillation. Initially, the patient was on aspirin for anticoagulation. He was switched to Eliquis. The patient was on Eliquis but on June 15th,  the patient had an attempted TEE and the patient had a lot of bleeding in the mouth, needed cautery by ENT, Dr. Andee Poles.  Eliquis was held and the patient was put on rectal aspirin at the time and then switched over to aspirin. The Eliquis was restarted once the bleeding episode subsided. A repeat MRI on the 19th showed multiple areas of acute infarct in the cerebral white matter bilaterally. The patient has continued to have difficulty with alertness. He will be started on Provigil today.  2.  Acute respiratory failure. The patient had to be transferred to Intensive Care Unit with respiratory distress during the hospital course, was on oxygen and this was secondary to aspiration. He is currently on 2 liters.  3.  Severe hypernatremia. Sodium was as high as 160. He was kept on D5W.  The patient is not able to adequately maintain his oral intake. He will likely need a PEG tube.  4.  Clinical sepsis, aspiration with methicillin-sensitive staph aureus. He was on Unasyn as per ID and now switched over to Ancef. He will need total of 4 weeks of antibiotics from date of 06/10 onwards.  TEE could not be done secondary to the patient unable to swallow the probe.  5.  Laceration of the mouth, status post cautery by ENT after TEE attempt. No further bleeding.  6.  Diabetes. The patient is on sliding scale. Levemir was started due to the D5 drip.  7.  Hyperlipidemia. Is currently on Lipitor.  8.  Acute renal failure and dehydration. The patient's renal function is improving with IV hydration.  9.  Thrombocytopenia. The patient's platelets are currently stable.   CONSULTANTS DURING HOSPITALIZATION: Dr. Adrian Blackwater of cardiology, Dr. Sampson Goon, Dr. Mellody Drown, physical therapy, speech therapy, ENT.    LABORATORY, DIAGNOSTIC AND RADIOLOGICAL DATA:  EKG showed accelerated junctional rhythm on admission, PVCs, nonspecific ST-T wave changes. TSH 2.07. Magnesium 1.4. Urinalysis greater than 500 mg of glucose. Troponin borderline 0.02. Initial glucose 154, BUN 12, creatinine 1.22, sodium 139, potassium 4.2, chloride 106, CO2 26. WBC 9.6, hemoglobin 12.9, platelet count 118.  CT scan of the head showed no acute intracranial findings. Chest x-ray no evidence of cardiopulmonary disease. Ultrasound of the carotid mild atherosclerotic changes noted in the carotid bifurcation bilaterally without significant stenosis. LDL 24, HDL 46, triglycerides 96. Echocardiogram ejection fraction 55% to 60%, no thrombi, dilated left atrium, dilated right atrium,  mild mitral valve regurgitation.  MRI of the brain showed motion-degraded exam findings suggestive of very small left frontal lobe and possibly tiny right frontal lobe infarct. Repeat CT scan of the head on the 12th no acute abnormality. Echocardiogram on  therapy 16th showed left ventricular ejection fraction 75%. Repeat MRI of the brain showed multiple areas of acute infarct in the cerebral white matter bilaterally.   CURRENT MEDICATIONS AT THE TIME OF TRANSFER:  He is on D5 with KCl 20 mEq 125 mL an hour, Tylenol 650 q.4 p.r.n. for pain, insulin sliding scale, Zofran 4 mg IV q.4 p.r.n., acetaminophen suppository 650 q.4 p.r.n., atorvastatin 40 daily, Eliquis 5 mg b.i.d., amlodipine 10 at bedtime, cefazolin 2 grams IV q.8, hydralazine 10 q.i.d., insulin Levemir 20 units at bedtime, Megace 400 b.i.d., metoprolol 25 q.8, Provigil 200 q.a.m.    TIME SPENT: Note, initial time spent seeing the patient during the day was 20 minutes.  in addition, another 60 minutes were spent discussing with the family, making arrangements [facility].    ____________________________ Lacie ScottsShreyang H. Allena KatzPatel, MD shp:cs D: 09/09/2013 13:40:16 ET T: 09/09/2013 14:10:59 ET JOB#: [id number]  cc: Shreyang H. Allena KatzPatel, MD, <Dictator> Charise CarwinSHREYANG H PATEL MD ELECTRONICALLY SIGNED 09/11/2013 10:17

## 2014-07-13 NOTE — Consult Note (Signed)
Referring Physician:  Alric Seton   Primary Care Physician:  Alric Seton : [place], [address] [place], [place] [address], [place] [phone]  Reason for Consult: Admit Date: 20-Apr-2013  Chief Complaint: confusion  Reason for Consult: confusion   History of Present Illness: History of Present Illness:   81 yo RHD M who is quite active presents to [facility] secondary to confusion.  Pt was also noted to be less responsive with some slurred speech.  There was no focal weakness described and pt denies numbness or vision changes.  Pt denies headache or passing out.  Since pt has been here, he was started on antibiotics in which have helped him to wake up.  ROS:  General fatigue   HEENT no complaints   Lungs cough   Cardiac no complaints   GI no complaints   GU no complaints   Musculoskeletal no complaints   Extremities no complaints   Skin no complaints   Neuro no complaints   Endocrine no complaints   Past Medical/Surgical Hx:  diabetes:   Past Medical/ Surgical Hx:  Past Medical History reviewed by me as above   Past Surgical History reviewed by me as above   Home Medications: Medication Instructions Last Modified Date/Time  SMZ-TMP DS 800 mg-160 mg oral tablet 1 tab(s) orally 2 times a day 07-Sep-13 13:10  Percocet 5/325 325 mg-5 mg oral tablet 1 tab(s) orally every 6 hours, As Needed- for Pain  07-Sep-13 13:10   Allergies:  No Known Allergies:   Allergies:  Allergies NKDA   Social/Family History: Employment Status: retired  Lives With: children  Living Arrangements: house  Social History: rare EtOH, no tob, no illicits  Family History: no stroke, no seizuresnl w   Vital Signs: **Vital Signs.:   11-Jun-15 08:38  Vital Signs Type Pre Medication  Temperature Temperature (F) 100  Celsius 37.7  Temperature Source oral  Pulse Pulse 77  Respirations Respirations 20  Systolic BP Systolic BP 449  Diastolic BP  (mmHg) Diastolic BP (mmHg) 86  Mean BP 102  Pulse Ox % Pulse Ox % 97  Pulse Ox Activity Level  At rest  Oxygen Delivery 3L   Physical Exam: General: nl weight, ill appearing  HEENT: normocephalic, sclera nonicteric, oropharynx clear  Neck: supple, no JVD, no bruits  Chest: R sided rhonchi, poor movement,  Cardiac: tachycardic, no murmur, 2+ pulses  Extremities: no C/C/E, FROM   Neurologic Exam: Mental Status: alert but oriented only to person and place, moderate dysarthria, nl naming and repetition  Cranial Nerves: PERRLA, EOMI, nl VF, face symmetric, tongue midline, shoulder shrug equal  Motor Exam: 4/5 B, nl tone  Deep Tendon Reflexes: 1+/4 B, ?? babinski B  Sensory Exam: pinprick, temperature, and vibration intact B  Coordination: FTN and HTS WNL   Lab Results: Thyroid:  09-Jun-15 00:31   Thyroid Stimulating Hormone 1.44 (0.45-4.50 (International Unit)  ----------------------- Pregnant patients have  different reference  ranges for TSH:  - - - - - - - - - -  Pregnant, first trimetser:  0.36 - 2.50 uIU/mL)  LabObservation:  09-Jun-15 07:49   OBSERVATION Reason for Test  Routine Micro:  09-Jun-15 21:24   Organism Name GRAM POSITIVE COCCI  Specimen Source right ac  Organism 1 GRAM POSITIVE COCCI  Culture Comment . ID TO FOLLOW SENSITIVITIES TO FOLLOW  Gram Stain 1 GRAM POSITIVE COCCI IN CLUSTERS  10-Jun-15 16:15   Micro Text Report BLOOD CULTURE  COMMENT                   NO GROWTH IN 8-12 HOURS   ANTIBIOTIC                       Culture Comment NO GROWTH IN 8-12 HOURS  Result(s) reported on 30 Aug 2013 at 12:00AM.  Routine Chem:  09-Jun-15 00:31   Cholesterol, Serum 89  Triglycerides, Serum 96  HDL (INHOUSE) 46  VLDL Cholesterol Calculated 19  LDL Cholesterol Calculated 24 (Result(s) reported on 28 Aug 2013 at 01:05AM.)  10-Jun-15 02:44   Magnesium, Serum 2.0 (1.8-2.4 THERAPEUTIC RANGE: 4-7 mg/dL TOXIC: > 10 mg/dL  -----------------------)    11:24    Result Comment APTT - RESULTS VERIFIED BY REPEAT TESTING.  - NOTIFIED OF CRITICAL VALUE  - ABIGAIL JACKSON AT 1207 ON 08/29/13  - [facility]  - READ-BACK PROCESS PERFORMED.  Result(s) reported on 29 Aug 2013 at 12:11PM.  11-Jun-15 04:42   Glucose, Serum  158  BUN  28  Creatinine (comp) 1.28  Sodium, Serum 139  Potassium, Serum 3.8  Chloride, Serum 107  CO2, Serum 24  Calcium (Total), Serum  8.3  Anion Gap 8  Osmolality (calc) 286  eGFR (African American) >60  eGFR (Non-African American)  54 (eGFR values <78m/min/1.73 m2 may be an indication of chronic kidney disease (CKD). Calculated eGFR is useful in patients with stable renal function. The eGFR calculation will not be reliable in acutely ill patients when serum creatinine is changing rapidly. It is not useful in  patients on dialysis. The eGFR calculation may not be applicable to patients at the low and high extremes of body sizes, pregnant women, and vegetarians.)  Cardiac:  09-Jun-15 00:31   CPK-MB, Serum 1.2 (Result(s) reported on 28 Aug 2013 at 01:16AM.)  Troponin I  0.09 (0.00-0.05 0.05 ng/mL or less: NEGATIVE  Repeat testing in 3-6 hrs  if clinically indicated. >0.05 ng/mL: POTENTIAL  MYOCARDIAL INJURY. Repeat  testing in 3-6 hrs if  clinically indicated. NOTE: An increase or decrease  of 30% or more on serial  testing suggests a  clinically important change)  Routine UA:  09-Jun-15 21:12   Color (UA) Yellow  Clarity (UA) Hazy  Glucose (UA) 150 mg/dL  Bilirubin (UA) Negative  Ketones (UA) Negative  Specific Gravity (UA) 1.018  Blood (UA) 3+  pH (UA) 5.0  Protein (UA) 30 mg/dL  Nitrite (UA) Negative  Leukocyte Esterase (UA) Negative (Result(s) reported on 28 Aug 2013 at 09:58PM.)  RBC (UA) 6 /HPF  WBC (UA) 6 /HPF  Bacteria (UA) TRACE  Epithelial Cells (UA) <1 /HPF  Mucous (UA) PRESENT  Hyaline Cast (UA) 5 /LPF (Result(s) reported on 28 Aug 2013 at 09:58PM.)  Routine Coag:  10-Jun-15 11:24   Activated  PTT (APTT)  > 160.0 (A HCT value >55% may artifactually increase the APTT. In one study, the increase was an average of 19%. Reference: "Effect on Routine and Special Coagulation Testing Values of Citrate Anticoagulant Adjustment in Patients with High HCT Values." American Journal of Clinical Pathology 2006;126:400-405.)  Routine Hem:  11-Jun-15 04:42   WBC (CBC)  13.5  RBC (CBC)  3.94  Hemoglobin (CBC)  12.3  Hematocrit (CBC)  37.6  Platelet Count (CBC)  67  MCV 96  MCH 31.2  MCHC 32.7  RDW  14.7  Neutrophil % 85.6  Lymphocyte % 8.0  Monocyte % 4.1  Eosinophil % 2.1  Basophil % 0.2  Neutrophil #  11.5  Lymphocyte # 1.1  Monocyte # 0.6  Eosinophil # 0.3  Basophil # 0.0 (Result(s) reported on 30 Aug 2013 at 06:15AM.)   Radiology Results: [place]:    08-Jun-15 20:56, US Carotid Doppler Bilateral  US Carotid Doppler Bilateral   REASON FOR EXAM:    slurred speech  COMMENTS:       PROCEDURE: [place]  - US CAROTID DOPPLER BILATERAL  - Aug 27 2013  8:56PM     CLINICAL DATA:  Slurred speech. History of diabetes and tobacco use.    EXAM:  BILATERAL CAROTID DUPLEX ULTRASOUND    TECHNIQUE:  Pearline Cables scale imaging, color Doppler and duplex ultrasound were  performed of bilateral carotid and vertebral arteries in the neck.    COMPARISON:  CT head 08/27/2013  FINDINGS:  Criteria: Quantification of carotid stenosis is based on velocity  parameters that correlate the residual internal carotid diameter  with NASCET-based stenosis levels, using the diameter of the distal  internal carotid lumen as the denominator for stenosis measurement.    The following velocity measurements were obtained (peak systolic  velocity/end-diastolic velocity):    RIGHT    ICA:  139/24 cm/sec    CCA:  915/05 cm/sec  SYSTOLIC ICA/CCA RATIO:  1    DIASTOLIC ICA/CCA RATIO:  0.7    ECA:  175 cm/sec    LEFT    ICA:  96/16 cm/sec    CCA:  697/94 cm/sec    SYSTOLIC ICA/CCA RATIO:  0.7    DIASTOLIC  ICA/CCA RATIO:  1.3  ECA:  159 cm/sec    RIGHT CAROTID ARTERY: Mild eccentric calcific and noncalcific plaque  formation in the carotid bifurcation. No hemodynamically significant  stenosis.    RIGHT VERTEBRAL ARTERY:  Antegrade flow direction.    LEFT CAROTID ARTERY: Mild eccentric calcific and noncalcific plaque  formation in the carotid bifurcation. No hemodynamically significant  stenosis.    LEFT VERTEBRAL ARTERY:  Antegrade flow direction     IMPRESSION:  Mild atherosclerotic changes noted in the carotid bifurcations  bilaterally without significant stenosis demonstrated.      Electronically Signed    By: Lucienne Capers M.D.    On: 08/27/2013 21:17         Verified By: Neale Burly, M.D.,  MRI:    09-Jun-15 14:00, MRI Brain Without Contrast  MRI Brain Without Contrast   REASON FOR EXAM:    slurred speech  COMMENTS:       PROCEDURE: MR  - MR BRAIN WO CONTRAST  - Aug 28 2013  2:00PM     CLINICAL DATA:  Slurred speech.    EXAM:  MRI HEAD WITHOUT CONTRAST    TECHNIQUE:  Multiplanar, multiecho pulse sequences of the brain and surrounding  structures were obtained without intravenous contrast.    COMPARISON:  08/27/2013 CT.  No comparison MR.  FINDINGS:  Exam is motion degraded.    Very small nonhemorrhagic acute left frontal lobe infarct. Tiny  nonhemorrhagic acute right frontal lobe infarct may also be present.    No obvious intracranial hemorrhage or mass although evaluation  limited by motion.    No hydrocephalus.  Mild age related atrophy.    Mild transverse ligament hypertrophy.    Small pituitary gland incidentally noted.   IMPRESSION:  Motion degraded exam with fast technique imaging utilized. Findings  suggestive of a very small left frontal lobe and possibly tiny right  frontal lobe acute infarct without associated hemorrhage.    Please see  above.      Electronically Signed    By: Chauncey Cruel M.D.    On: 08/28/2013 14:09          Verified By: Doug Sou, M.D.,  CT:    08-Jun-15 17:32, CT Head Without Contrast  CT Head Without Contrast   REASON FOR EXAM:    slurred speech, weakness  COMMENTS:       PROCEDURE: CT  - CT HEAD WITHOUT CONTRAST  - Aug 27 2013  5:32PM     CLINICAL DATA:  Speech disturbance.  Weakness.  Possible stroke.    EXAM:  CT HEAD WITHOUT CONTRAST    TECHNIQUE:  Contiguous axial images were obtained from the base of the skull  through the vertex without intravenous contrast.    COMPARISON:  None.  FINDINGS:  The brain does not show advanced generalized atrophy for age. There  is no evidence of old or acute focal infarction, mass lesion,  hemorrhage, hydrocephalus or extra-axial collection. Calvarium is  unremarkable. Sinuses, middle ears and mastoids are clear. There is  atherosclerotic calcification of the major vessels at the base of  the brain.     IMPRESSION:  No acute or focal finding.  Mild atrophy, typical of age.      Electronically Signed    By: Nelson Chimes M.D.    On: 08/27/2013 17:59     Verified By: Jules Schick, M.D.,   Radiology Impression: Radiology Impression: MRI of brain personally reviewed by me and shows two small R frontal subacute infarcts   Impression/Recommendations: Recommendations:   prior notes reviewed by me reviewed by me   R frontal subacute tiny infarcts-  etiology likely atrial fibrillation and this is likely asymptomatic;  do not believe that these are acute or from endocarditis Encephalopathy-  moderate to severe, this is likely due infection and this is the cause of pts confusion and slurred speech;  this is improving ok to start oral anticoagulation at anytime, would not use heparin bridging continue ASA daily  adjust LDL < 100 if not on statin no need for TEE continue antibiotics per ID will sign off, please call with questions  Electronic Signatures: Jamison Neighbor (MD)  (Signed 11-Jun-15 12:04)  Authored: REFERRING  PHYSICIAN, Primary Care Physician, Consult, History of Present Illness, Review of Systems, PAST MEDICAL/SURGICAL HISTORY, HOME MEDICATIONS, ALLERGIES, Social/Family History, NURSING VITAL SIGNS, Physical Exam-, LAB RESULTS, RADIOLOGY RESULTS, Recommendations   Last Updated: 11-Jun-15 12:04 by Jamison Neighbor (MD)

## 2014-07-13 NOTE — Discharge Summary (Signed)
PATIENT NAME:  Elijah [name], [name] MR#:  [id number] DATE OF BIRTH:  05/16/1933  DATE OF ADMISSION:  08/27/2013 DATE OF DISCHARGE:  09/11/2013  ADDENDUM: To previously dictated discharge summary by Dr. Auburn BilberryShreyang Patel. Everything remains the same at this point. Instead of the patient going [facility] family is in agreement to have the patient go to [facility]. He was already accepted before, but the family was not able to make a final decision and we are waiting to get final re-approval from [facility]. After discussion with [facility] rehab coordinator, Campbell LernerJanine, decision was made to transfer the patient there. The patient is accepted by Dr. Hermelinda MedicusSchwartz there, and he will be transferred there. The patient's son is in agreement. I have discussed with him on the phone. On the date of discharge, his vital signs were as follows: Temperature 98.6, heart rate 83 per minute, respirations 18 per minute, blood pressure 133/81 mmHg, and he is saturating 97% on room air.   PERTINENT PHYSICAL EXAMINATION: On the day of discharge. CARDIOVASCULAR: S1, S2 normal. No murmurs, rubs, or gallops.  LUNGS: Clear to auscultation bilaterally. No wheezing, rales, rhonchi, or crepitation.  ABDOMEN: Soft, benign.  NEUROLOGIC: Nonfocal examination. All other physical examination remained at baseline.   DIAGNOSTIC DATA: Please note, the patient's sodium has normalized to 145 this morning. His hemoglobin was 8 and platelets of 149,000. All other labs seem fairly normal limits. His blood sugar has been running somewhat high as the patient has started eating well.   CURRENT MEDICATIONS: On the day of transfer/discharge are as follows: 1.  Tylenol 650 mg p.o. every 4 hours as needed.  2.  Sliding scale insulin.  3.  Zofran 4 mg IV every 4 hours.  4.  Lipitor 40 mg p.o. daily.  5.  Eliquis 5 mg p.o. b.i.d.  6.  Amlodipine 10 mg p.o. at bedtime.  7.  Hydralazine 10 mg p.o. 4 times a day. 8.  Insulin Levemir 20  units subcutaneous at bedtime.  9.  Megace 400 mg p.o. b.i.d.  10.  Metoprolol 25 mg p.o. every 8 hours. 11.  Provigil 200 mg p.o. every morning.  12.  Ancef 2 grams IV every 8 hours.  CODE STATUS: FULL code.  Please note, the patient will need a total of 4 weeks of IV Ancef. This total 4 weeks will come from 08/29/2013 and can be stopped on 8th of July.   TOTAL TIME TAKING CARE OF PATIENT: 35 minutes.  ____________________________ Ellamae SiaVipul S. Sherryll BurgerShah, MD vss:sb D: 09/11/2013 10:26:00 ET T: 09/11/2013 10:45:14 ET JOB#: [id number]  cc: Vipul S. Sherryll BurgerShah, MD, <Dictator> Jillene Bucksenny C. Arlana Pouchate, MD Ellamae SiaVIPUL S [facility] MD ELECTRONICALLY SIGNED 09/13/2013 12:34

## 2014-07-13 NOTE — Consult Note (Signed)
Brief Consult Note: Diagnosis: laceration to oral cavity.   Patient was seen by consultant.   Consult note dictated.   Recommend to proceed with surgery or procedure.   Recommend further assessment or treatment.   Comments: 81 y.o. male s/p TEE with laceration of oral cavity palate.  Called to evalaute.  Patient lethargic and bleeding from oral cavity.  PE Gen- NAD, lethargic, but awakened and responsive  Nose- clear anteriorly OC/OP- right posterior oropharyngeal laceration involving anterior tonsillar pillar and extending into soft palate  Control of oral cavity hemorrhage- 4 sticks of siler nitrate used to apply to laceration site after cetacaine.  Bleeding continued after period of observation.  After discussion with patient's son, injection of 1% lidocaine with 1:200,000 epinephrine 2 cc's into medial superior part of laceration and then another stick of silver nitrate cauterization was applied.  Impression:  Oral cavity laceration s/p cauterization X 2  1)  Defer to medicine regarding holding anticoagulation, but would help decrease bleeding in immediate period 2)  Pt. very somnolent and lethargic.  Rec. speech evaluation to ensure not risk of aspiration.  Cleared to resume oral feeding per laceration, but would defer to speech eval. 3)  Please let me know if bleeding recurs.  Patient will need soft diet once cleared by speech but this should heal without need for further intervention.  Control of oral cavity hemorrhage-  Patient.  Electronic Signatures: Flossie DibbleVaught, Louella Medaglia Charles (MD)  (Signed 15-Jun-15 12:55)  Authored: Brief Consult Note   Last Updated: 15-Jun-15 12:55 by Flossie DibbleVaught, Maudell Stanbrough Charles (MD)

## 2014-07-13 NOTE — H&P (Signed)
PATIENT NAME:  Elijah [name], Elijah [name] MR#:  [id number] DATE OF BIRTH:  [birth year]  DATE OF ADMISSION:  08/27/2013  PRIMARY CARE PROVIDER: Dr. Dewaine Oats.  EMERGENCY DEPARTMENT REFERRING PHYSICIAN: Dr. Governor Rooks.   CHIEF COMPLAINT: Episode of slurred speech, weakness.   HISTORY OF PRESENT ILLNESS: The patient is a 81 year old white male with previous history of diabetes, who was putting some laminate floor on his house, when all of a sudden he started shaking uncontrollably and his speech was slurred. His blood sugar was checked and it was in the 130s. EMS was called. When they arrived, they noticed him to be in irregular heart rhythm. When the patient arrived in the ED, he was noticed to have frequent PVCs and junctional rhythm. He continues to have some tachycardia. He denies any chest pain or shortness of breath. Denies any palpitations. He denies any nausea, vomiting or diarrhea. Denies any urinary symptoms.   PAST MEDICAL HISTORY: Significant for diabetes type 2, hypertension, hyperlipidemia.   PAST SURGICAL HISTORY: None.   ALLERGIES: None.   MEDICATIONS: Metformin 500, 2 tabs p.o. b.i.d. He is on pioglitazone 30 daily.   SOCIAL HISTORY: Smokes cigars from time to time. No alcohol or drug use.   FAMILY HISTORY: Positive for hypertension.   REVIEW OF SYSTEMS:    CONSTITUTIONAL: Denies any fevers. Complains of fatigue, weakness. No pain. No weight loss. No weight gain.  EYES: No blurred or double vision. No pain. No redness. No inflammation. No glaucoma.  EARS, NOSE, THROAT: No tinnitus. No ear pain. No hearing loss. No seasonal or year-round allergies. No epistaxis.  RESPIRATORY: Denies any cough, wheezing, hemoptysis. No COPD.  CARDIOVASCULAR: Denies any chest pain, orthopnea, edema or arrhythmia.  GASTROINTESTINAL: No nausea, vomiting, diarrhea. No abdominal pain. No hematemesis. No melena.  GENITOURINARY: Denies any dysuria, hematuria, renal calculus or frequency.  ENDOCRINE:  Denies any polyuria, nocturia or thyroid problems.  HEMATOLOGIC AND LYMPHATIC: Denies anemia, easy bruisability or bleeding.  SKIN: No acne. No rash.  MUSCULOSKELETAL: Denies any pain in neck, back or shoulder.  NEUROLOGIC: No numbness, CVA, TIA, seizures.  PSYCHIATRIC: No anxiety, insomnia or ADD.   PHYSICAL EXAMINATION: VITAL SIGNS: Temperature 98.4, pulse 127, respirations 18, blood pressure 130/58, O2 sat 96%.  GENERAL: The patient is a well-developed male in no acute distress.  HEENT: Head atraumatic, normocephalic. Pupils equally round, reactive to light and accommodation. There is no conjunctival pallor. No scleral icterus. Nasal exam shows no drainage or ulceration. Oropharynx is clear without any exudate.  NECK: Supple without any JVD.  CARDIOVASCULAR: Regular rate and rhythm. No murmurs, rubs, clicks or gallops.  LUNGS: Clear to auscultation bilaterally without any rales, rhonchi, wheezing.  ABDOMEN: Soft, nontender, nondistended. Positive bowel sounds x 4.  EXTREMITIES: There is no clubbing, cyanosis or edema.  SKIN: No rash.  LYMPHATICS: No lymph nodes palpable.  VASCULAR: Good DP, PT pulses.  PSYCHIATRIC: Not anxious.  NEUROLOGICAL: Cranial nerves II through XII grossly appear intact.  No focal deficits.  LABORATORY AND RADIOLOGICAL DATA: CT scan of the head shows no acute abnormalities, mild atrophy. His glucose is 154, BUN 12, creatinine 1.22, sodium 139, potassium 4.2, chloride 106; CO2 is 26, calcium 9.1. Troponin less than 0.02. WBC 9.6, hemoglobin 12.9, platelet count 180. Urinalysis: Nitrites negative, leukocyte esterase negative.   ASSESSMENT AND PLAN: The patient is a 81 year old white male with history of diabetes, who is presenting with an episode of slurred speech, now noticed to have junctional tachycardia.  1.  Slurred speech:  Possible transient ischemic attack. At this time, will start him on aspirin. Carotid Dopplers, echocardiogram.  2.  Junctional tachycardia:  Will get an echocardiogram of the heart. Start him on metoprolol b.i.d. Cardiology consult.  3.  Diabetes: Will do sliding scale. Continue Actos and metformin.  4.  Hyperlipidemia: Check a fasting lipid panel in the a.m. Not on any treatment. 5.  Miscellaneous: The patient will be on Lovenox for deep vein thrombosis prophylaxis.    TIME SPENT: 50 minutes.    ____________________________ Lacie ScottsShreyang H. Allena KatzPatel, MD shp:jcm D: 08/27/2013 19:59:48 ET T: 08/27/2013 20:40:38 ET JOB#: [id number]  cc: Rosalina Dingwall H. Allena KatzPatel, MD, <Dictator> Charise CarwinSHREYANG H Eloyse Causey MD ELECTRONICALLY SIGNED 09/04/2013 17:02

## 2014-07-13 NOTE — Consult Note (Signed)
PATIENT NAME:  Elijah [name], Elijah [name] MR#:  [id number] DATE OF BIRTH:  1934-03-02  DATE OF CONSULTATION:  08/29/2013  INFECTIOUS DISEASE CONSULTATION  REFERRING PHYSICIAN:  Dr. Imogene Burnhen CONSULTING PHYSICIAN:  Stann Mainlandavid P. Sampson GoonFitzgerald, MD  REASON FOR CONSULTATION: Bacteremia and fever.   HISTORY OF PRESENT ILLNESS: This is [name] very pleasant 81 year old gentleman who has [name] history of diabetes, hypertension and hyperlipidemia. He was in his usual state of health until 06/08 when he developed slurred speech and shaking. He was brought to the ER and found to have possible atrial fibrillation. MRI ended up showing likely 2 areas of CVA, acute and frontal lobe. He then developed fever starting shortly after admission and spiked at 105. Blood cultures are now positive, Gram-positive cocci in 2 of 2 blood cultures, both aerobic and anaerobic.   Of note, the patient states he was feeling quite well prior to the sudden onset of symptoms. He had not had any recent colonoscopies, cystoscopies, dental procedures, skin infections, pneumonias or other symptoms prior. He had not been feeling ill. Some of the history is difficult to obtain due to his slurred speech. However, he is able to relate most of his current condition.  After the fever, chest x-ray was done which did show pneumonia. He has been covered with Zosyn and Zyvox but was initially on ceftriaxone and azithromycin.  PAST MEDICAL HISTORY:  1.  Diabetes.  2.  Hypertension.  3.  Hyperlipidemia.   PAST SURGICAL HISTORY:  None known.  ALLERGIES: No known drug allergies.   SOCIAL HISTORY: Occasional smokes. No alcohol or drugs. He lives with his wife. No sick contacts. No travel.   FAMILY HISTORY: Positive for hypertension.  REVIEW OF SYSTEMS: Eleven systems reviewed and negative except as per HPI.  ANTIBIOTICS SINCE ADMISSION:  Include initially ceftriaxone and azithromycin on 06/08 and Linezolid begun 06/09, as well as Zosyn.   PHYSICAL EXAMINATION: VITAL  SIGNS: Temperature max was 105.6 yesterday evening. Currently is 97.7. Pulse 91, blood pressure 121/75, respirations 22, sat 96% on 3 liters.  GENERAL: He is quite frail appearing. He has quite slurred speech.  HEENT: Pupils are equal, round and reactive to light and accommodation. His sclerae are somewhat injected, but there is no hemorrhage which is noted. His oropharynx and mucous membranes are quite dry. He does have slurred speech.  NECK: Supple.  HEART: Very distant and difficult to hear.  LUNGS: Coarse breath sounds bilaterally. No wheeze. Very poor air movement.  ABDOMEN: Soft, nontender, nondistended. No hepatosplenomegaly.  EXTREMITIES: He has no clubbing, cyanosis or edema.  SKIN: He has multiple bruising on his hands and some on his legs but no obvious splinter hemorrhages, Janeway lesions or Osler nodes.  NEUROLOGIC: He has quite slurred speech.   LABORATORY DATA: White blood count on admission was 9.6, on the 9th IT was 14.3 and currently it is 10.4, hemoglobin 12.0. Platelet count has dropped from 180 to 84.  TSH was normal. Cardiac panel showed eventually third test positive with [name] troponin of 0.09. Renal function shows [name] creatinine elevated at 1.76, BUN 30. No LFTs were done. Blood cultures 2 of 2 are growing gram-positive cocci in both aerobic and anaerobic bottles. C. difficile was negative. Urinalysis on admission was negative. MRI on 06/09 showed [name] poor study but small left frontal lobe and possible tiny right frontal lobe, acute infarct without hemorrhage.   Chest x-ray 06/09 showed new airspace disease at the left base, likely pneumonia. Initial chest x-ray 06/08 showed no cardiopulmonary disease.  Carotid Dopplers showed mild arteriosclerotic disease without significant stenosis.  Echocardiogram done 06/09 shows EF 55% to 60%, moderately dilated left atrium, moderately dilated right atrium, mild mitral valve regurg, mild aortic regurg, mild to moderate aortic valve sclerosis  and calcification without any evidence of aortic stenosis, mild tricuspid regurg.   IMPRESSION: [name] 81 year old gentleman with hypertension, diabetes, hyperlipidemia, admitted with strokelike symptoms and then [name] very high fever. He has bacteremia with 2 to 2 growing gram-positive cocci in both aerobic and anaerobic cultures. He has no preceding illness or infectious symptoms. This was rather sudden onset. He has had no recent procedures or skin infections.   RECOMMENDATIONS: 1.  Change the Zyvox to vancomycin.  2.  Continue Zosyn until further ID of organism.  3.  Repeat blood cultures x 2 to ensure clearance.  4.  If he has had [name] TTE but may benefit from [name] TEE depending on the organism. He may also need further imaging of his abdomen and other sites to search for [name] possible source of infection. 5.  I would consider consulting neurology.  I am concerned about the use of heparin in someone with possible endocarditis and septic emboli as it can lead to conversion of the embolic sites to hemorrhagic CVA.  Thank you very much for the consult. I will be glad to follow with you.    ____________________________ Stann Mainland. Sampson Goon, MD dpf:ce D: 08/29/2013 15:40:31 ET T: 08/29/2013 17:36:58 ET JOB#: [id number]  cc: Stann Mainland. Sampson Goon, MD, <Dictator> Elijah Mallet Sampson Goon MD ELECTRONICALLY SIGNED 09/04/2013 6:51

## 2015-01-24 ENCOUNTER — Ambulatory Visit: Payer: Self-pay | Admitting: Family Medicine

## 2015-07-23 IMAGING — CT CT HEAD WITHOUT CONTRAST
1 series · 1 of 1 positions shown · non-contrast
Comparison: None.

CLINICAL DATA: Speech disturbance.  Weakness.  Possible stroke.

EXAM:
CT HEAD WITHOUT CONTRAST
TECHNIQUE: Contiguous axial images were obtained from the base of the skull
through the vertex without intravenous contrast.

[Series 1: topogram · sagittal · 0.6mm · 1.00mm/px · 1 of 1 slices shown]
[im 1/1]
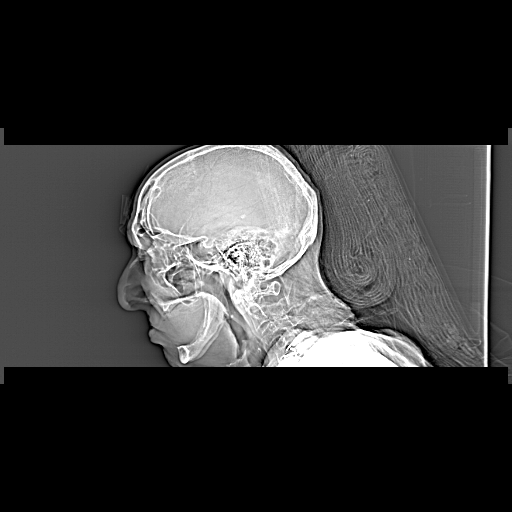

[1 of 1 positions shown; findings below may reference images not displayed]

FINDINGS: The brain does not show advanced generalized atrophy for age. There
is no evidence of old or acute focal infarction, mass lesion,
hemorrhage, hydrocephalus or extra-axial collection. Calvarium is
unremarkable. Sinuses, middle ears and mastoids are clear. There is
atherosclerotic calcification of the major vessels at the base of
the brain.
IMPRESSION: No acute or focal finding.  Mild atrophy, typical of age.

## 2015-07-23 IMAGING — CR DG CHEST 1V PORT
1 series · 1 of 1 positions shown · non-contrast
Comparison: None.

CLINICAL DATA: Weakness

EXAM:
PORTABLE CHEST - 1 VIEW

[ap]
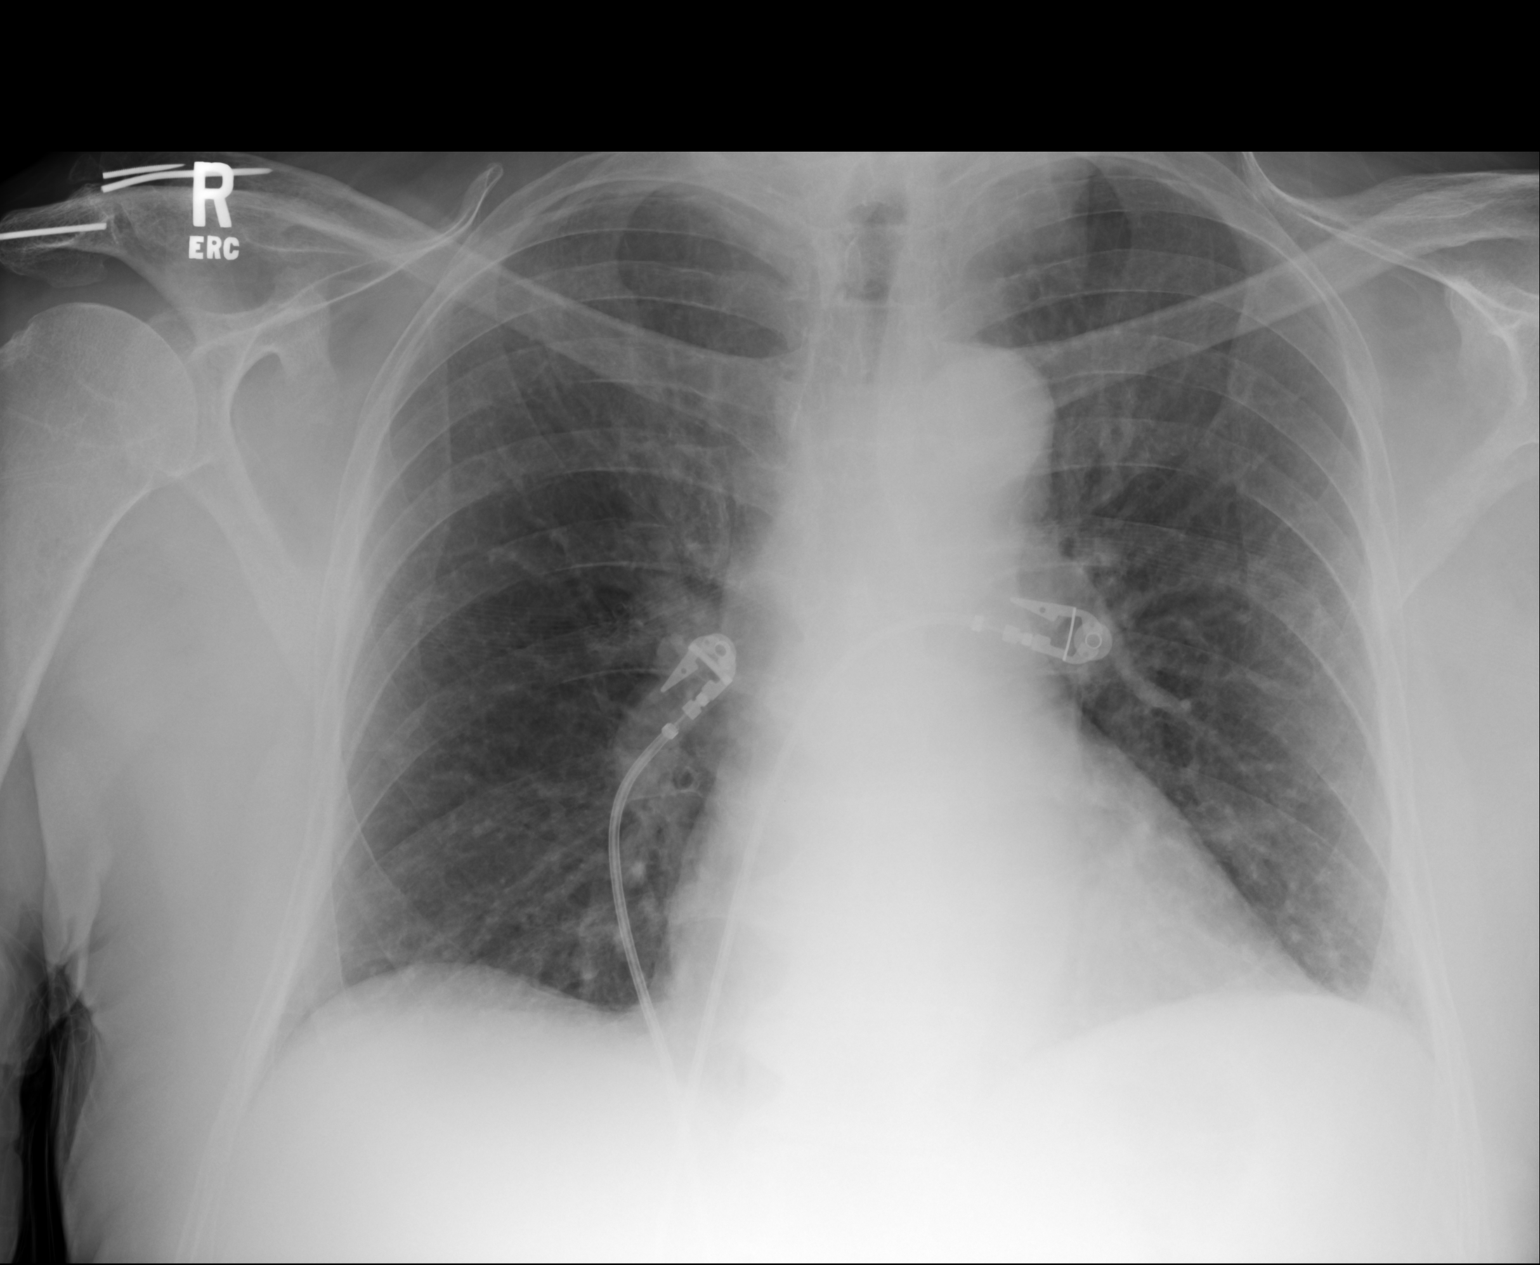

[1 of 1 positions shown; findings below may reference images not displayed]

FINDINGS: Increased interstitial markings, possibly chronic. No focal
consolidation. No pleural effusion or pneumothorax.

Heart is top-normal in size.

Postsurgical changes involving the acromion/lateral clavicle.
IMPRESSION: No evidence of acute cardiopulmonary disease.

## 2015-08-07 IMAGING — CR DG CHEST 1V PORT
1 series · 1 of 1 positions shown · non-contrast
Comparison: Portable exam 3523 hr compared to 09/04/2013

CLINICAL DATA: PICC line placement

EXAM:
PORTABLE CHEST - 1 VIEW

[AP]
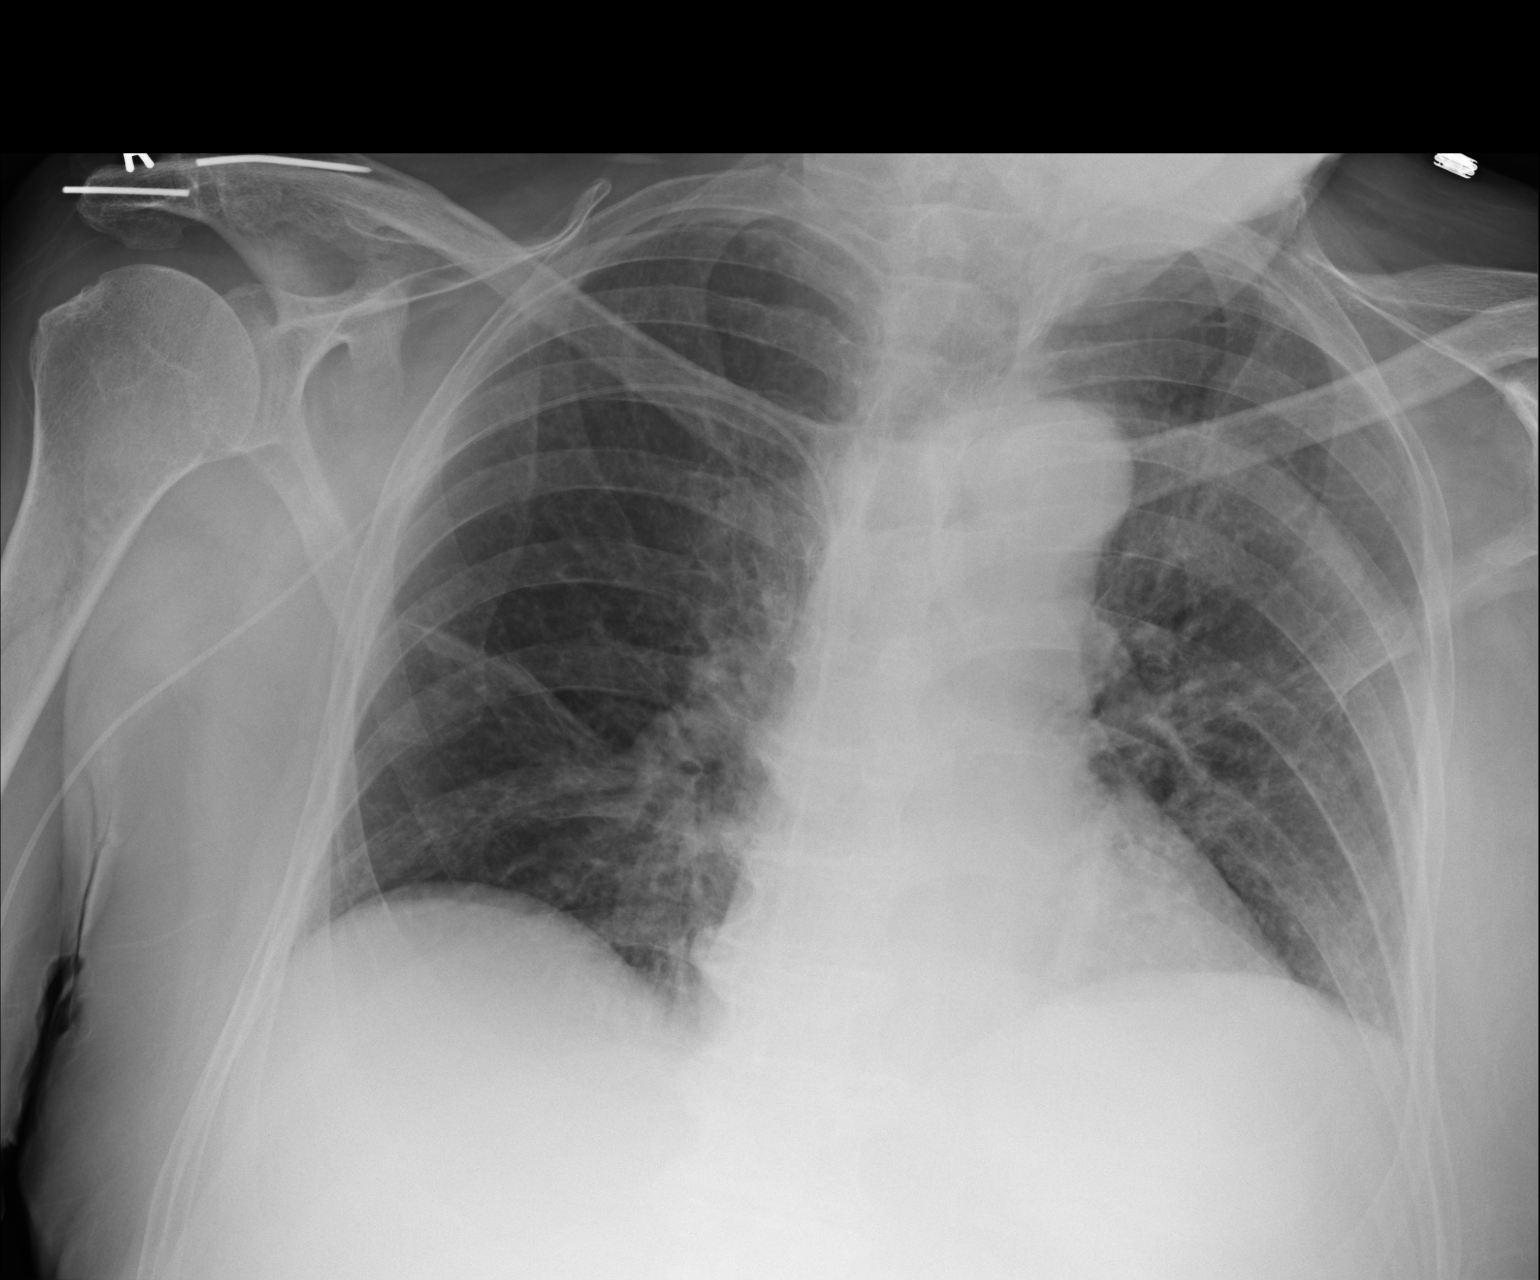

[1 of 1 positions shown; findings below may reference images not displayed]

FINDINGS: Kyphotic positioning with rotation to the LEFT.

Tip of RIGHT arm PICC line projects over cavoatrial junction.

Normal heart size, mediastinal contours and pulmonary vascularity.

RIGHT basilar atelectasis.

Improved aeration LEFT lower lobe.

Upper lungs clear.

No pleural effusion or pneumothorax.

Bones demineralized.

K-wires at RIGHT acromion and distal RIGHT clavicle.
IMPRESSION: Tip of RIGHT arm PICC line projects over cavoatrial junction.

RIGHT basilar atelectasis persists with improved aeration in LEFT
lower lobe.

## 2018-11-11 ENCOUNTER — Ambulatory Visit
Admission: EM | Admit: 2018-11-11 | Discharge: 2018-11-11 | Disposition: A | Discharge status: Home / Self Care | Payer: Medicare Other | Attending: Urgent Care | Admitting: Urgent Care

## 2018-11-11 ENCOUNTER — Encounter: Payer: Self-pay | Admitting: Emergency Medicine

## 2018-11-11 ENCOUNTER — Other Ambulatory Visit: Payer: Self-pay

## 2018-11-11 DIAGNOSIS — N390 Urinary tract infection, site not specified: Secondary | ICD-10-CM

## 2018-11-11 HISTORY — DX: Type 2 diabetes mellitus without complications: E11.9

## 2018-11-11 LAB — URINALYSIS, COMPLETE (UACMP) WITH MICROSCOPIC
Bilirubin Urine: NEGATIVE
Glucose, UA: NEGATIVE mg/dL
Ketones, ur: NEGATIVE mg/dL
Nitrite: POSITIVE — AB
Protein, ur: 30 mg/dL — AB
Specific Gravity, Urine: 1.015 (ref 1.005–1.030)
Squamous Epithelial / LPF: NONE SEEN (ref 0–5)
WBC, UA: >50 WBC/hpf (ref 0–5)
pH: 5.5 (ref 5.0–8.0)

## 2018-11-11 MED ORDER — NITROFURANTOIN MONOHYD MACRO 100 MG PO CAPS: 100.0000 mg | ORAL_CAPSULE | Freq: Two times a day (BID) | ORAL | 0 refills | Status: DC

## 2018-11-11 NOTE — Discharge Instructions (Signed)
It was very nice seeing you today in clinic. Thank you for entrusting me with your care.  ° °As discussed, your urine is POSITIVE for infection. Will approach treatment as follows: ° °Prescription has been sent to your pharmacy for antibiotics.  °Please pick up and take as directed. FINISH the entire course of medication even if you are feeling better.  °A culture will be sent on your provided sample. If it comes back resistant to what I have prescribed you, someone will call you and let you know that we will need to change antibiotics. °Increase fluid intake as much as possible to flush your urinary tract.  °Water is always the best.  °Avoid caffeine until your infection clears up, as it can contribute to painful bladder spasms.  °May use Tylenol and/or Ibuprofen as needed for pain/fever. ° °Make arrangements to follow up with your regular doctor in 1 week for re-evaluation. If your symptoms/condition worsens, please seek follow up care either here or in the ER. Please remember, our [place] providers are "right here with you" when you need us.  ° °Again, it was my pleasure to take care of you today. Thank you for choosing our clinic. I hope that you start to feel better quickly.  ° °Allah Reason, MSN, APRN, FNP-C, CEN °Advanced Practice Provider °[facility] ° °

## 2018-11-11 NOTE — ED Provider Notes (Signed)
[place], [place]   Name: Elijah [name] DOB: 02/24/1934 MRN: [id number] CSN: [id number] PCP: Elijah [name], Elijah [name], Elijah [name]  Arrival date and time:  11/11/18 1417  Chief Complaint:  Dysuria   NOTE: Prior to seeing the patient today, I have reviewed the triage nursing documentation and vital signs. Clinical staff has updated patient's PMH/PSHx, current medication list, and drug allergies/intolerances to ensure comprehensive history available to assist in medical decision making.   History:   HPI: Elijah [name] is a 85 y.o. male who presents today with complaints of urinary symptoms that began with acute onset yesterday. He complains of dysuria, frequency, and urgency. He has not appreciated any gross hematuria, nor has he noticed his urine being malodorous. Patient states, "mainly I am here because it just hurts when I pee". Patient denies any associated nausea, vomiting, fever, and chills. He has not experienced any pain in his lower back, flank, or abdomen. Patient advises that he does not have a significant history for recurrent urinary tract infections, STIs, or urolithiasis. He denies pain in his penis, testicles, or scrotum; no swelling. No penile bleeding or discharge.   Past Medical History:  Diagnosis Date  . Diabetes mellitus without complication [facility](HCC)     Past Surgical History:  Procedure Laterality Date  . SHOULDER SURGERY Right     History reviewed. No pertinent family history.  Social History   Tobacco Use  . Smoking status: Never Smoker  . Smokeless tobacco: Never Used  Substance Use Topics  . Alcohol use: Never    Frequency: Never  . Drug use: Never    Patient Active Problem List   Diagnosis Date Noted  . CVA (cerebral infarction) 09/11/2013    Home Medications:    Current Meds  Medication Sig  . amLODipine (NORVASC) 10 MG tablet Take 10 mg by mouth at bedtime.  Marland Kitchen. apixaban (ELIQUIS) 5 MG TABS tablet Take 5 mg by mouth 2 (two) times daily.  Marland Kitchen. atorvastatin  (LIPITOR) 40 MG tablet Take 40 mg by mouth daily.  Marland Kitchen. glimepiride (AMARYL) 4 MG tablet Take 4 mg by mouth daily.  . hydrALAZINE (APRESOLINE) 10 MG tablet Take 10 mg by mouth 4 (four) times daily.  . megestrol (MEGACE) 400 MG/10ML suspension Take 400 mg by mouth 2 (two) times daily.  . metFORMIN (GLUCOPHAGE) 500 MG tablet Take 500 mg by mouth 4 (four) times daily.  . pioglitazone (ACTOS) 30 MG tablet Take 30 mg by mouth daily.  . traMADol (ULTRAM) 50 MG tablet Take 1 tablet (50 mg total) by mouth every 12 (twelve) hours as needed for severe pain.    Allergies:   Patient has no known allergies.  Review of Systems (ROS): Review of Systems  Constitutional: Negative for chills and fever.  Respiratory: Negative for cough and shortness of breath.   Cardiovascular: Negative for chest pain and palpitations.  Gastrointestinal: Negative for abdominal pain, nausea and vomiting.  Genitourinary: Positive for dysuria, frequency and urgency. Negative for discharge, flank pain, hematuria, penile pain, penile swelling, scrotal swelling and testicular pain.  Musculoskeletal: Negative for back pain.  Neurological: Negative for dizziness, syncope, weakness and headaches.  All other systems reviewed and are negative.    Vital Signs: Today's Vitals   11/11/18 1444 11/11/18 1448 11/11/18 1511  BP:  (!) 150/69   Pulse:  88   Resp:  16   Temp:  99.2 F (37.3 [name])   TempSrc:  Oral   SpO2:  100%   Weight: 175 lb (79.4  kg)    Height: 6\' 2"  (1.88 m)    PainSc: 5   5     Physical Exam: Physical Exam  Constitutional: He is oriented to person, place, and time and well-developed, well-nourished, and in no distress.  HENT:  Head: Normocephalic and atraumatic.  Mouth/Throat: Mucous membranes are normal.  Eyes: Pupils are equal, round, and reactive to light.  Cardiovascular: Normal rate, regular rhythm, normal heart sounds and intact distal pulses. Exam reveals no gallop and no friction rub.  No murmur heard.  Pulmonary/Chest: Effort normal and breath sounds normal. No respiratory distress. He has no wheezes. He has no rales.  Abdominal: Soft. Normal appearance and bowel sounds are normal. There is no abdominal tenderness. There is no CVA tenderness.  Neurological: He is alert and oriented to person, place, and time. Gait normal.  Skin: Skin is warm and dry. No rash noted.  Psychiatric: Mood, memory, affect and judgment normal.  Nursing note and vitals reviewed.   Urgent Care Treatments / Results:   LABS: PLEASE NOTE: all labs that were ordered this encounter are listed, however only abnormal results are displayed. Labs Reviewed  URINALYSIS, COMPLETE (UACMP) WITH MICROSCOPIC - Abnormal; Notable for the following components:      Result Value   APPearance CLOUDY (*)    Hgb urine dipstick SMALL (*)    Protein, ur 30 (*)    Nitrite POSITIVE (*)    Leukocytes,Ua LARGE (*)    Bacteria, UA MANY (*)    All other components within normal limits  URINE CULTURE    EKG: -None  RADIOLOGY: No results found.  PROCEDURES: Procedures  MEDICATIONS RECEIVED THIS VISIT: Medications - No data to display  PERTINENT CLINICAL COURSE NOTES/UPDATES:   Initial Impression / Assessment and Plan / Urgent Care Course:  Pertinent labs & imaging results that were available during my care of the patient were personally reviewed by me and considered in my medical decision making (see lab/imaging section of note for values and interpretations).  Elijah [name] is a 85 y.o. male who presents to [facility] today with complaints of urinary symptoms that are concerning for infection.   Patient is well appearing overall in clinic today. He does not appear to be in any acute distress. Presenting symptoms (see HPI) and exam as documented above. UA was (+) for infection; reflex culture sent. Will treat with a 5 day course of Nitrofurantoin. Patient encouraged to complete the entire course of antibiotics even  if he begins to feel better. He was advised that if culture demonstrates resistance to the prescribed antibiotic, he will be contacted and advised of the need to change the antibiotic being used to treat his infection. Patient encouraged to increase his fluid intake as much as possible. Discussed that water is always best to flush the urinary tract. He was advised to avoid caffeine containing fluids until his infections clears, as caffeine can cause him to experience painful bladder spasms. May use Tylenol and/or Ibuprofen as needed for pain/fever.  Discussed follow up with primary care physician in 1 week for re-evaluation. I have reviewed the follow up and strict return precautions for any new or worsening symptoms. Patient is aware of symptoms that would be deemed urgent/emergent, and would thus require further evaluation either here or in the emergency department. At the time of discharge, he verbalized understanding and consent with the discharge plan as it was reviewed with him. All questions were fielded by provider and/or clinic staff prior to  patient discharge.    Final Clinical Impressions / Urgent Care Diagnoses:   Final diagnoses:  Urinary tract infection without hematuria, site unspecified    New Prescriptions:  [place] Controlled Substance Registry consulted? Not Applicable  Meds ordered this encounter  Medications  . nitrofurantoin, macrocrystal-monohydrate, (MACROBID) 100 MG capsule    Sig: Take 1 capsule (100 mg total) by mouth 2 (two) times daily.    Dispense:  10 capsule    Refill:  0    Recommended Follow up Care:  Patient encouraged to follow up with the following provider within the specified time frame, or sooner as dictated by the severity of his symptoms. As always, he was instructed that for any urgent/emergent care needs, he should seek care either here or in the emergency department for more immediate evaluation.  Follow-up Information    Elijah [name], Elijah [name], Elijah [name] In 1 week.    Specialty: Internal Medicine Why: General reassessment of symptoms if not improving Contact information: [address]   [place] [address] [phone]         NOTE: This note was prepared using Dragon dictation software along with smaller phrase technology. Despite my best ability to proofread, there is the potential that transcriptional errors may still occur from this process, and are completely unintentional.    Verlee MonteGray, Gwendy Boeder E, NP 11/11/18 2124

## 2018-11-11 NOTE — ED Triage Notes (Signed)
Patient c/o burning when urinating and increase in urinary urgency and frequency that started last night.  Patient denies fevers.

## 2018-11-14 LAB — URINE CULTURE: Culture: >=100000 — AB

## 2018-11-15 ENCOUNTER — Telehealth (HOSPITAL_COMMUNITY): Payer: Self-pay | Admitting: Emergency Medicine

## 2018-11-15 NOTE — Telephone Encounter (Signed)
Urine culture was positive for STAPHYLOCOCCUS AUREUS and was given  macrobid at urgent care visit. Attempted to reach patient. No answer at this time. Busy signal.

## 2019-09-29 ENCOUNTER — Emergency Department
Admission: EM | Admit: 2019-09-29 | Discharge: 2019-09-29 | Disposition: A | Discharge status: Home / Self Care | Payer: Medicare Other | Attending: Emergency Medicine | Admitting: Emergency Medicine

## 2019-09-29 ENCOUNTER — Other Ambulatory Visit: Payer: Self-pay

## 2019-09-29 DIAGNOSIS — R5383 Other fatigue: Secondary | ICD-10-CM | POA: Insufficient documentation

## 2019-09-29 DIAGNOSIS — E860 Dehydration: Secondary | ICD-10-CM | POA: Diagnosis not present

## 2019-09-29 DIAGNOSIS — Z7984 Long term (current) use of oral hypoglycemic drugs: Secondary | ICD-10-CM | POA: Diagnosis not present

## 2019-09-29 DIAGNOSIS — Z7901 Long term (current) use of anticoagulants: Secondary | ICD-10-CM | POA: Diagnosis not present

## 2019-09-29 DIAGNOSIS — R531 Weakness: Secondary | ICD-10-CM | POA: Diagnosis present

## 2019-09-29 DIAGNOSIS — E119 Type 2 diabetes mellitus without complications: Secondary | ICD-10-CM | POA: Diagnosis not present

## 2019-09-29 LAB — CBC
HCT: 31.5 % — ABNORMAL LOW (ref 39.0–52.0)
Hemoglobin: 10.6 g/dL — ABNORMAL LOW (ref 13.0–17.0)
MCH: 34.3 pg — ABNORMAL HIGH (ref 26.0–34.0)
MCHC: 33.7 g/dL (ref 30.0–36.0)
MCV: 101.9 fL — ABNORMAL HIGH (ref 80.0–100.0)
Platelets: 123 10*3/uL — ABNORMAL LOW (ref 150–400)
RBC: 3.09 MIL/uL — ABNORMAL LOW (ref 4.22–5.81)
RDW: 15.6 % — ABNORMAL HIGH (ref 11.5–15.5)
WBC: 5.6 10*3/uL (ref 4.0–10.5)
nRBC: 0 % (ref 0.0–0.2)

## 2019-09-29 LAB — BASIC METABOLIC PANEL
Anion gap: 14 (ref 5–15)
BUN: 26 mg/dL — ABNORMAL HIGH (ref 8–23)
CO2: 17 mmol/L — ABNORMAL LOW (ref 22–32)
Calcium: 9.5 mg/dL (ref 8.9–10.3)
Chloride: 104 mmol/L (ref 98–111)
Creatinine, Ser: 1.43 mg/dL — ABNORMAL HIGH (ref 0.61–1.24)
GFR calc Af Amer: 52 mL/min — ABNORMAL LOW (ref >60)
GFR calc non Af Amer: 45 mL/min — ABNORMAL LOW (ref >60)
Glucose, Bld: 276 mg/dL — ABNORMAL HIGH (ref 70–99)
Potassium: 4.3 mmol/L (ref 3.5–5.1)
Sodium: 135 mmol/L (ref 135–145)

## 2019-09-29 LAB — URINALYSIS, COMPLETE (UACMP) WITH MICROSCOPIC
Bilirubin Urine: NEGATIVE
Glucose, UA: 50 mg/dL — AB
Hgb urine dipstick: NEGATIVE
Ketones, ur: NEGATIVE mg/dL
Leukocytes,Ua: NEGATIVE
Nitrite: NEGATIVE
Protein, ur: NEGATIVE mg/dL
Specific Gravity, Urine: 1.015 (ref 1.005–1.030)
Squamous Epithelial / HPF: NONE SEEN (ref 0–5)
pH: 5 (ref 5.0–8.0)

## 2019-09-29 MED ORDER — SODIUM CHLORIDE 0.9 % IV BOLUS: 1000.0000 mL | Freq: Once | INTRAVENOUS | Status: DC

## 2019-09-29 MED ORDER — SODIUM CHLORIDE 0.9% FLUSH: 3.0000 mL | Freq: Once | INTRAVENOUS | Status: DC

## 2019-09-29 NOTE — ED Triage Notes (Signed)
FIRST NURSE: Pt to ER via EMS with c/o leg weakness after working in the garden.

## 2019-09-29 NOTE — Discharge Instructions (Addendum)
Your labs showed a decrease in your kidney function due to dehydration today.  Please follow up with your doctor within 1 week to have your lab tests rechecked to ensure this has returned to normal.

## 2019-09-29 NOTE — ED Triage Notes (Signed)
Pt to ER via EMS from home where he was in the garden picking beans.  States his knees gave way and he sat down and could not get back up because he had no strength in his legs.

## 2019-09-29 NOTE — ED Provider Notes (Signed)
[facility] Emergency Department Provider Note  ____________________________________________  Time seen: Approximately 5:27 PM  I have reviewed the triage vital signs and the nursing notes.   HISTORY  Chief Complaint Weakness    HPI Elijah [name] is a 86 y.o. male with a history of diabetes reports being in his usual state of health today when he was outside leaning over to pick beings from his garden this morning.  Outside temperature is 90 [name] and sunny.  He noted that his legs began to feel weak on both sides so he sat down and felt too tired to get back up.  Denies any chest pain abdominal pain back pain shortness of breath headache or other acute complaints.  He has been eating and drinking fluids well at his normal baseline recently and denies any recent symptoms.  Currently feels much better, and ambulated to the bathroom in the treatment room.      Past Medical History:  Diagnosis Date  . Diabetes mellitus without complication [facility])      Patient Active Problem List   Diagnosis Date Noted  . CVA (cerebral infarction) 09/11/2013     Past Surgical History:  Procedure Laterality Date  . SHOULDER SURGERY Right      Prior to Admission medications   Medication Sig Start Date End Date Taking? Authorizing Provider  acetaminophen (TYLENOL) 325 MG tablet Take 650 mg by mouth every 4 (four) hours as needed for moderate pain or fever.    [provider]  acetaminophen (TYLENOL) 650 MG suppository Place 650 mg rectally every 4 (four) hours as needed for moderate pain or fever.    [provider]  amLODipine (NORVASC) 10 MG tablet Take 10 mg by mouth at bedtime.    [provider]  apixaban (ELIQUIS) 5 MG TABS tablet Take 5 mg by mouth 2 (two) times daily.    [provider]  atorvastatin (LIPITOR) 40 MG tablet Take 40 mg by mouth daily. 07/30/13   [provider]  ceFAZolin in dextrose 5 % 50 mL ivpb  Inject 2 g into the vein every 8 (eight) hours.    [provider]  glimepiride (AMARYL) 4 MG tablet Take 4 mg by mouth daily. 08/17/13   [provider]  hydrALAZINE (APRESOLINE) 10 MG tablet Take 10 mg by mouth 4 (four) times daily.    [provider]  ipratropium-albuterol (DUONEB) 0.5-2.5 (3) MG/3ML SOLN Take 3 mLs by nebulization every 4 (four) hours as needed (wheezing).    [provider]  lidocaine (LIDODERM) 5 % Place 2 patches onto the skin daily. Remove & Discard patch within 12 hours or as directed by MD 10/10/13   Angiulli, Mcarthur Rossetti, PA-C  megestrol (MEGACE) 400 MG/10ML suspension Take 400 mg by mouth 2 (two) times daily.    [provider]  metFORMIN (GLUCOPHAGE) 500 MG tablet Take 500 mg by mouth 4 (four) times daily. 08/08/13   [provider]  nitrofurantoin, macrocrystal-monohydrate, (MACROBID) 100 MG capsule Take 1 capsule (100 mg total) by mouth 2 (two) times daily. 11/11/18   Verlee Monte, NP  ondansetron [facility]) 4 MG/5ML solution Take 4 mg by mouth every 4 (four) hours as needed for nausea or vomiting.    [provider]  pioglitazone (ACTOS) 30 MG tablet Take 30 mg by mouth daily. 07/30/13   [provider]  traMADol (ULTRAM) 50 MG tablet Take 1 tablet (50 mg total) by mouth every 12 (twelve) hours as needed  for severe pain. 10/10/13   Angiulli, Mcarthur Rossetti, PA-C  insulin detemir (LEVEMIR) 100 UNIT/ML injection Inject 20 Units into the skin at bedtime.  11/11/18  [provider]  metoprolol tartrate (LOPRESSOR) 25 MG tablet Take 25 mg by mouth 3 (three) times daily.   11/11/18  [provider]     Allergies Patient has no known allergies.   No family history on file.  Social History Social History   Tobacco Use  . Smoking status: Never Smoker  . Smokeless tobacco: Never Used  Vaping Use  . Vaping Use: Never used  Substance Use Topics  . Alcohol use: Never  . Drug use: Never     Review of Systems  Constitutional:   No fever or chills.  ENT:   No sore throat. No rhinorrhea. Cardiovascular:   No chest pain or syncope. Respiratory:   No dyspnea or cough. Gastrointestinal:   Negative for abdominal pain, vomiting and diarrhea.  Musculoskeletal:   Negative for focal pain or swelling All other systems reviewed and are negative except as documented above in ROS and HPI.  ____________________________________________   PHYSICAL EXAM:  VITAL SIGNS: ED Triage Vitals  Enc Vitals Group     BP 09/29/19 1207 (!) 145/60     Pulse Rate 09/29/19 1207 70     Resp --      Temp 09/29/19 1207 98 F (36.7 C)     Temp Source 09/29/19 1207 Oral     SpO2 09/29/19 1207 98 %     Weight 09/29/19 1208 175 lb (79.4 kg)     Height 09/29/19 1208 6\' 3"  (1.905 m)     Head Circumference --      Peak Flow --      Pain Score 09/29/19 1214 0     Pain Loc --      Pain Edu? --      Excl. in GC? --     Vital signs reviewed, nursing assessments reviewed.   Constitutional:   Alert and oriented. Non-toxic appearance. Eyes:   Conjunctivae are normal. EOMI. PERRL. ENT      Head:   Normocephalic and atraumatic.      Nose: Normal      Mouth/Throat:   Dry mucous membranes.      Neck:   No meningismus. Full ROM. Hematological/Lymphatic/Immunilogical:   No cervical lymphadenopathy. Cardiovascular:   RRR. Symmetric bilateral radial and DP pulses.  No murmurs. Cap refill less than 2 seconds. Respiratory:   Normal respiratory effort without tachypnea/retractions. Breath sounds are clear and equal bilaterally. No wheezes/rales/rhonchi. Gastrointestinal:   Soft and nontender. Non distended. There is no CVA tenderness.  No rebound, rigidity, or guarding. Musculoskeletal:   Normal range of motion in all extremities. No joint effusions.  No lower extremity tenderness.  No edema. Neurologic:   Normal speech and language.  Motor grossly intact. No acute focal neurologic deficits are  appreciated.  Skin:    Skin is warm, dry and intact. No rash noted.  No petechiae, purpura, or bullae.  ____________________________________________    LABS (pertinent positives/negatives) (all labs ordered are listed, but only abnormal results are displayed) Labs Reviewed  BASIC METABOLIC PANEL - Abnormal; Notable for the following components:      Result Value   CO2 17 (*)    Glucose, Bld 276 (*)    BUN 26 (*)    Creatinine, Ser 1.43 (*)    GFR calc non Af Amer 45 (*)    GFR calc  Af Amer 52 (*)    All other components within normal limits  CBC - Abnormal; Notable for the following components:   RBC 3.09 (*)    Hemoglobin 10.6 (*)    HCT 31.5 (*)    MCV 101.9 (*)    MCH 34.3 (*)    RDW 15.6 (*)    Platelets 123 (*)    All other components within normal limits  URINALYSIS, COMPLETE (UACMP) WITH MICROSCOPIC - Abnormal; Notable for the following components:   Color, Urine YELLOW (*)    APPearance HAZY (*)    Glucose, UA 50 (*)    Bacteria, UA RARE (*)    All other components within normal limits  CBG MONITORING, ED   ____________________________________________   EKG  Interpreted by me Sinus rhythm rate of 69, normal axis.  First-degree AV block with PR interval of 400 ms.  Normal QRS ST segments.  Isolated T wave inversion in lead III which is nonspecific.Marland Kitchen  No ischemic changes.  ____________________________________________    RADIOLOGY  No results found.  ____________________________________________   PROCEDURES Procedures  ____________________________________________  DIFFERENTIAL DIAGNOSIS   Dehydration, electrolyte abnormality, fatigue, heat exhaustion  CLINICAL IMPRESSION / ASSESSMENT AND PLAN / ED COURSE  Medications ordered in the ED: Medications - No data to display  Pertinent labs & imaging results that were available during my care of the patient were reviewed by me and considered in my medical decision making (see chart for  details).  VEGAS FRITZE was evaluated in Emergency Department on 09/29/2019 for the symptoms described in the history of present illness. He was evaluated in the context of the global COVID-19 pandemic, which necessitated consideration that the patient might be at risk for infection with the SARS-CoV-2 virus that causes COVID-19. Institutional protocols and algorithms that pertain to the evaluation of patients at risk for COVID-19 are in a state of rapid change based on information released by regulatory bodies including the CDC and federal and state organizations. These policies and algorithms were followed during the patient's care in the ED.   Patient presents with fatigue in the setting of excessive heat exposure and clinically and lab data showing signs of dehydration.  Vital signs are unremarkable, is currently feeling back to normal and he had no recent acute symptoms or red flags.  Offered IV fluids for hydration which she declines and wishes to go back home to orally hydrate at home.  Counseled him on strategies for this and being cautious with hot weather.  Stable for discharge at this time with reassuring labs other than slight decrease in his creatinine, otherwise unremarkable electrolytes, no signs of GI bleed or stroke or cardiovascular event.  Patient is on medications for diabetes and has not run out.      ____________________________________________   FINAL CLINICAL IMPRESSION(S) / ED DIAGNOSES    Final diagnoses:  Dehydration  Fatigue, unspecified type     ED Discharge Orders    None      Portions of this note were generated with dragon dictation software. Dictation errors may occur despite best attempts at proofreading.   Sharman Cheek, MD 09/29/19 [phone]

## 2020-04-17 ENCOUNTER — Other Ambulatory Visit: Payer: Self-pay

## 2020-04-17 ENCOUNTER — Other Ambulatory Visit: Payer: Medicare Other

## 2020-04-17 ENCOUNTER — Emergency Department: Payer: Medicare Other

## 2020-04-17 ENCOUNTER — Encounter: Payer: Self-pay | Admitting: Radiology

## 2020-04-17 ENCOUNTER — Inpatient Hospital Stay
Admission: EM | Admit: 2020-04-17 | Discharge: 2020-04-18 | DRG: 377 | Disposition: A | Discharge status: Other Acute Inpatient Hospital | Payer: Medicare Other | Attending: Pulmonary Disease | Admitting: Pulmonary Disease

## 2020-04-17 DIAGNOSIS — K922 Gastrointestinal hemorrhage, unspecified: Secondary | ICD-10-CM | POA: Diagnosis not present

## 2020-04-17 DIAGNOSIS — I864 Gastric varices: Secondary | ICD-10-CM | POA: Diagnosis present

## 2020-04-17 DIAGNOSIS — Z66 Do not resuscitate: Secondary | ICD-10-CM | POA: Diagnosis not present

## 2020-04-17 DIAGNOSIS — K4020 Bilateral inguinal hernia, without obstruction or gangrene, not specified as recurrent: Secondary | ICD-10-CM | POA: Diagnosis present

## 2020-04-17 DIAGNOSIS — I8511 Secondary esophageal varices with bleeding: Secondary | ICD-10-CM | POA: Diagnosis not present

## 2020-04-17 DIAGNOSIS — I4891 Unspecified atrial fibrillation: Secondary | ICD-10-CM | POA: Diagnosis present

## 2020-04-17 DIAGNOSIS — R079 Chest pain, unspecified: Secondary | ICD-10-CM | POA: Diagnosis not present

## 2020-04-17 DIAGNOSIS — I10 Essential (primary) hypertension: Secondary | ICD-10-CM | POA: Diagnosis present

## 2020-04-17 DIAGNOSIS — Z7901 Long term (current) use of anticoagulants: Secondary | ICD-10-CM | POA: Diagnosis not present

## 2020-04-17 DIAGNOSIS — K31811 Angiodysplasia of stomach and duodenum with bleeding: Principal | ICD-10-CM | POA: Diagnosis present

## 2020-04-17 DIAGNOSIS — Z79899 Other long term (current) drug therapy: Secondary | ICD-10-CM

## 2020-04-17 DIAGNOSIS — K921 Melena: Secondary | ICD-10-CM | POA: Diagnosis present

## 2020-04-17 DIAGNOSIS — Z006 Encounter for examination for normal comparison and control in clinical research program: Secondary | ICD-10-CM | POA: Diagnosis not present

## 2020-04-17 DIAGNOSIS — I21A1 Myocardial infarction type 2: Secondary | ICD-10-CM | POA: Diagnosis not present

## 2020-04-17 DIAGNOSIS — I214 Non-ST elevation (NSTEMI) myocardial infarction: Secondary | ICD-10-CM

## 2020-04-17 DIAGNOSIS — R413 Other amnesia: Secondary | ICD-10-CM | POA: Diagnosis present

## 2020-04-17 DIAGNOSIS — I350 Nonrheumatic aortic (valve) stenosis: Secondary | ICD-10-CM | POA: Diagnosis not present

## 2020-04-17 DIAGNOSIS — E119 Type 2 diabetes mellitus without complications: Secondary | ICD-10-CM | POA: Diagnosis present

## 2020-04-17 DIAGNOSIS — K7290 Hepatic failure, unspecified without coma: Secondary | ICD-10-CM | POA: Diagnosis not present

## 2020-04-17 DIAGNOSIS — I351 Nonrheumatic aortic (valve) insufficiency: Secondary | ICD-10-CM | POA: Diagnosis not present

## 2020-04-17 DIAGNOSIS — D500 Iron deficiency anemia secondary to blood loss (chronic): Secondary | ICD-10-CM

## 2020-04-17 DIAGNOSIS — Z20822 Contact with and (suspected) exposure to covid-19: Secondary | ICD-10-CM | POA: Diagnosis present

## 2020-04-17 DIAGNOSIS — D696 Thrombocytopenia, unspecified: Secondary | ICD-10-CM | POA: Diagnosis present

## 2020-04-17 DIAGNOSIS — J90 Pleural effusion, not elsewhere classified: Secondary | ICD-10-CM | POA: Diagnosis present

## 2020-04-17 DIAGNOSIS — K766 Portal hypertension: Secondary | ICD-10-CM

## 2020-04-17 DIAGNOSIS — E872 Acidosis: Secondary | ICD-10-CM | POA: Diagnosis present

## 2020-04-17 DIAGNOSIS — N5089 Other specified disorders of the male genital organs: Secondary | ICD-10-CM | POA: Diagnosis present

## 2020-04-17 DIAGNOSIS — Z794 Long term (current) use of insulin: Secondary | ICD-10-CM

## 2020-04-17 DIAGNOSIS — K7460 Unspecified cirrhosis of liver: Secondary | ICD-10-CM | POA: Diagnosis present

## 2020-04-17 DIAGNOSIS — R578 Other shock: Secondary | ICD-10-CM | POA: Diagnosis not present

## 2020-04-17 DIAGNOSIS — K7469 Other cirrhosis of liver: Secondary | ICD-10-CM | POA: Diagnosis not present

## 2020-04-17 DIAGNOSIS — I8510 Secondary esophageal varices without bleeding: Secondary | ICD-10-CM | POA: Diagnosis present

## 2020-04-17 DIAGNOSIS — Z8673 Personal history of transient ischemic attack (TIA), and cerebral infarction without residual deficits: Secondary | ICD-10-CM | POA: Diagnosis not present

## 2020-04-17 DIAGNOSIS — R188 Other ascites: Secondary | ICD-10-CM | POA: Diagnosis present

## 2020-04-17 DIAGNOSIS — D62 Acute posthemorrhagic anemia: Secondary | ICD-10-CM | POA: Diagnosis present

## 2020-04-17 DIAGNOSIS — K862 Cyst of pancreas: Secondary | ICD-10-CM | POA: Diagnosis present

## 2020-04-17 DIAGNOSIS — R601 Generalized edema: Secondary | ICD-10-CM | POA: Diagnosis not present

## 2020-04-17 DIAGNOSIS — N179 Acute kidney failure, unspecified: Secondary | ICD-10-CM | POA: Diagnosis not present

## 2020-04-17 DIAGNOSIS — K652 Spontaneous bacterial peritonitis: Secondary | ICD-10-CM

## 2020-04-17 LAB — LACTIC ACID, PLASMA
Lactic Acid, Venous: 9.6 mmol/L (ref 0.5–1.9)
Lactic Acid, Venous: 9.3 mmol/L (ref 0.5–1.9)

## 2020-04-17 LAB — BASIC METABOLIC PANEL
Anion gap: 21 — ABNORMAL HIGH (ref 5–15)
BUN: 56 mg/dL — ABNORMAL HIGH (ref 8–23)
CO2: 13 mmol/L — ABNORMAL LOW (ref 22–32)
Calcium: 8.7 mg/dL — ABNORMAL LOW (ref 8.9–10.3)
Chloride: 106 mmol/L (ref 98–111)
Creatinine, Ser: 1.12 mg/dL (ref 0.61–1.24)
GFR, Estimated: >60 mL/min (ref >60)
Glucose, Bld: 243 mg/dL — ABNORMAL HIGH (ref 70–99)
Potassium: 4.4 mmol/L (ref 3.5–5.1)
Sodium: 140 mmol/L (ref 135–145)

## 2020-04-17 LAB — CBC
HCT: 18 % — ABNORMAL LOW (ref 39.0–52.0)
Hemoglobin: 5.7 g/dL — ABNORMAL LOW (ref 13.0–17.0)
MCH: 35.4 pg — ABNORMAL HIGH (ref 26.0–34.0)
MCHC: 31.7 g/dL (ref 30.0–36.0)
MCV: 111.8 fL — ABNORMAL HIGH (ref 80.0–100.0)
Platelets: 191 10*3/uL (ref 150–400)
RBC: 1.61 MIL/uL — ABNORMAL LOW (ref 4.22–5.81)
RDW: 18.2 % — ABNORMAL HIGH (ref 11.5–15.5)
WBC: 11.7 10*3/uL — ABNORMAL HIGH (ref 4.0–10.5)
nRBC: 0.5 % — ABNORMAL HIGH (ref 0.0–0.2)

## 2020-04-17 LAB — HEPATIC FUNCTION PANEL
ALT: 33 U/L (ref 0–44)
AST: 63 U/L — ABNORMAL HIGH (ref 15–41)
Albumin: 2.5 g/dL — ABNORMAL LOW (ref 3.5–5.0)
Alkaline Phosphatase: 110 U/L (ref 38–126)
Bilirubin, Direct: 0.3 mg/dL — ABNORMAL HIGH (ref 0.0–0.2)
Indirect Bilirubin: 0.8 mg/dL (ref 0.3–0.9)
Total Bilirubin: 1.1 mg/dL (ref 0.3–1.2)
Total Protein: 6.5 g/dL (ref 6.5–8.1)

## 2020-04-17 LAB — BETA-HYDROXYBUTYRIC ACID: Beta-Hydroxybutyric Acid: 1.17 mmol/L — ABNORMAL HIGH (ref 0.05–0.27)

## 2020-04-17 LAB — PREPARE RBC (CROSSMATCH)

## 2020-04-17 LAB — PROTIME-INR
INR: 1.6 — ABNORMAL HIGH (ref 0.8–1.2)
Prothrombin Time: 18.2 seconds — ABNORMAL HIGH (ref 11.4–15.2)

## 2020-04-17 LAB — SARS CORONAVIRUS 2 BY RT PCR (HOSPITAL ORDER, PERFORMED IN ~~LOC~~ HOSPITAL LAB): SARS Coronavirus 2: NEGATIVE

## 2020-04-17 LAB — ABO/RH: ABO/RH(D): A POS

## 2020-04-17 LAB — TROPONIN I (HIGH SENSITIVITY)
Troponin I (High Sensitivity): 1247 ng/L
Troponin I (High Sensitivity): 1468 ng/L

## 2020-04-17 LAB — PROCALCITONIN: Procalcitonin: 0.24 ng/mL

## 2020-04-17 LAB — BRAIN NATRIURETIC PEPTIDE: B Natriuretic Peptide: 441 pg/mL — ABNORMAL HIGH (ref 0.0–100.0)

## 2020-04-17 MED ORDER — IOHEXOL 350 MG/ML SOLN
100.0000 mL | Freq: Once | INTRAVENOUS | Status: AC | PRN
  Administered 2020-04-17: 100 mL via INTRAVENOUS

## 2020-04-17 MED ORDER — PANTOPRAZOLE SODIUM 40 MG IV SOLR: 40.0000 mg | Freq: Two times a day (BID) | INTRAVENOUS | Status: DC

## 2020-04-17 MED ORDER — SODIUM CHLORIDE 0.9 % IV SOLN
50.0000 ug/h | INTRAVENOUS | Status: DC
  Administered 2020-04-17 – 2020-04-18 (×2): 50 ug/h via INTRAVENOUS
  Filled 2020-04-17 (×7): qty 1

## 2020-04-17 MED ORDER — POLYETHYLENE GLYCOL 3350 17 G PO PACK: 17.0000 g | PACK | Freq: Every day | ORAL | Status: DC | PRN

## 2020-04-17 MED ORDER — PANTOPRAZOLE SODIUM 40 MG IV SOLR
40.0000 mg | Freq: Once | INTRAVENOUS | Status: AC
  Administered 2020-04-17: 40 mg via INTRAVENOUS
  Filled 2020-04-17: qty 40

## 2020-04-17 MED ORDER — SODIUM CHLORIDE 0.9 % IV SOLN
10.0000 mL/h | Freq: Once | INTRAVENOUS | Status: AC
  Administered 2020-04-17: 10 mL/h via INTRAVENOUS

## 2020-04-17 MED ORDER — OCTREOTIDE LOAD VIA INFUSION
50.0000 ug | Freq: Once | INTRAVENOUS | Status: AC
  Administered 2020-04-17: 50 ug via INTRAVENOUS
  Filled 2020-04-17: qty 25

## 2020-04-17 MED ORDER — SODIUM CHLORIDE 0.9 % IV SOLN
8.0000 mg/h | INTRAVENOUS | Status: DC
  Administered 2020-04-18 (×3): 8 mg/h via INTRAVENOUS
  Filled 2020-04-17 (×3): qty 80

## 2020-04-17 MED ORDER — LACTATED RINGERS IV BOLUS
1000.0000 mL | Freq: Once | INTRAVENOUS | Status: AC
  Administered 2020-04-17: 1000 mL via INTRAVENOUS

## 2020-04-17 MED ORDER — SODIUM CHLORIDE 0.9 % IV SOLN
1.0000 g | Freq: Once | INTRAVENOUS | Status: AC
  Administered 2020-04-17: 1 g via INTRAVENOUS
  Filled 2020-04-17: qty 10

## 2020-04-17 MED ORDER — PANTOPRAZOLE SODIUM 40 MG IV SOLR
40.0000 mg | Freq: Once | INTRAVENOUS | Status: AC
  Administered 2020-04-18: 40 mg via INTRAVENOUS
  Filled 2020-04-17: qty 40

## 2020-04-17 MED ORDER — DOCUSATE SODIUM 100 MG PO CAPS: 100.0000 mg | ORAL_CAPSULE | Freq: Two times a day (BID) | ORAL | Status: DC | PRN

## 2020-04-17 MED ORDER — LACTATED RINGERS IV BOLUS: 500.0000 mL | Freq: Once | INTRAVENOUS | Status: DC

## 2020-04-17 NOTE — ED Notes (Signed)
Date and time results received: 04/17/20  (use smartphrase ".now" to insert current time)  Test: Lactic Acid 9.6 Troponin: 1247  Name of Provider Notified: [place]  Orders Received? Or Actions Taken?: New orders, see MAR

## 2020-04-17 NOTE — ED Triage Notes (Signed)
First Nurse Note: Arrives from home via [facility] with c/o DOE and weakness.  Per report RA sat 98%, dropped with ambulation into the 80's, sats improved with rest.

## 2020-04-17 NOTE — ED Notes (Signed)
Pt a@ CT

## 2020-04-17 NOTE — ED Provider Notes (Signed)
[facility] Emergency Department Provider Note   ____________________________________________   Event Date/Time   First MD Initiated Contact with Patient 04/17/20 1630     (approximate)  I have reviewed the triage vital signs and the nursing notes.   HISTORY  Chief Complaint Weakness    HPI Elijah [name] is a 87 y.o. male with past medical history of hypertension, diabetes, atrial fibrillation, and stroke who presents to the ED for weakness.  Patient reports that he has been feeling increasingly weak for the past 4 to 5 days.  Symptoms got so bad today that he was unable to get up out of bed and EMS was called to his house.  He denies any fevers, cough, or chest pain, but does state that he has been getting out of breath more easily.  He denies any vomiting or diarrhea, but does report some black stools recently, which he attributes to his multivitamin.  He has not had any dysuria, hematuria, or abdominal pain.  He does have large bilateral inguinal hernias, which she states have not been bothering him.  While he was previously on Eliquis, patient states that he has not been taking this for some time.  He states he only takes medication for his diabetes.        Past Medical History:  Diagnosis Date  . Diabetes mellitus without complication [facility])     Patient Active Problem List   Diagnosis Date Noted  . CVA (cerebral infarction) 09/11/2013    Past Surgical History:  Procedure Laterality Date  . SHOULDER SURGERY Right     Prior to Admission medications   Medication Sig Start Date End Date Taking? Authorizing Provider  acetaminophen (TYLENOL) 325 MG tablet Take 650 mg by mouth every 4 (four) hours as needed for moderate pain or fever.    [provider]  acetaminophen (TYLENOL) 650 MG suppository Place 650 mg rectally every 4 (four) hours as needed for moderate pain or fever.    [provider]  amLODipine (NORVASC) 10 MG tablet  Take 10 mg by mouth at bedtime.    [provider]  apixaban (ELIQUIS) 5 MG TABS tablet Take 5 mg by mouth 2 (two) times daily.    [provider]  atorvastatin (LIPITOR) 40 MG tablet Take 40 mg by mouth daily. 07/30/13   [provider]  ceFAZolin in dextrose 5 % 50 mL ivpb Inject 2 g into the vein every 8 (eight) hours.    [provider]  glimepiride (AMARYL) 4 MG tablet Take 4 mg by mouth daily. 08/17/13   [provider]  hydrALAZINE (APRESOLINE) 10 MG tablet Take 10 mg by mouth 4 (four) times daily.    [provider]  ipratropium-albuterol (DUONEB) 0.5-2.5 (3) MG/3ML SOLN Take 3 mLs by nebulization every 4 (four) hours as needed (wheezing).    [provider]  lidocaine (LIDODERM) 5 % Place 2 patches onto the skin daily. Remove & Discard patch within 12 hours or as directed by MD 10/10/13   Angiulli, Mcarthur Rossetti, PA-C  megestrol (MEGACE) 400 MG/10ML suspension Take 400 mg by mouth 2 (two) times daily.    [provider]  metFORMIN (GLUCOPHAGE) 500 MG tablet Take 500 mg by mouth 4 (four) times daily. 08/08/13   [provider]  nitrofurantoin, macrocrystal-monohydrate, (MACROBID) 100 MG capsule Take 1 capsule (100 mg total) by mouth 2 (two) times daily. 11/11/18   Verlee Monte, NP  ondansetron [facility]) 4 MG/5ML solution  Take 4 mg by mouth every 4 (four) hours as needed for nausea or vomiting.    [provider]  pioglitazone (ACTOS) 30 MG tablet Take 30 mg by mouth daily. 07/30/13   [provider]  traMADol (ULTRAM) 50 MG tablet Take 1 tablet (50 mg total) by mouth every 12 (twelve) hours as needed for severe pain. 10/10/13   Angiulli, Mcarthur Rossetti, PA-C  insulin detemir (LEVEMIR) 100 UNIT/ML injection Inject 20 Units into the skin at bedtime.  11/11/18  [provider]  metoprolol tartrate (LOPRESSOR) 25 MG tablet Take 25 mg by mouth 3 (three) times daily.   11/11/18  [provider]     Allergies Patient has no known allergies.  No family history on file.  Social History Social History   Tobacco Use  . Smoking status: Never Smoker  . Smokeless tobacco: Never Used  Vaping Use  . Vaping Use: Never used  Substance Use Topics  . Alcohol use: Never  . Drug use: Never    Review of Systems  Constitutional: No fever/chills.  Positive for generalized weakness. Eyes: No visual changes. ENT: No sore throat. Cardiovascular: Denies chest pain. Respiratory: Positive for shortness of breath. Gastrointestinal: No abdominal pain.  No nausea, no vomiting.  No diarrhea.  No constipation.  Positive for melena. Genitourinary: Negative for dysuria. Musculoskeletal: Negative for back pain. Skin: Negative for rash. Neurological: Negative for headaches, focal weakness or numbness.  ____________________________________________   PHYSICAL EXAM:  VITAL SIGNS: ED Triage Vitals [04/17/20 1403]  Enc Vitals Group     BP (!) 119/54     Pulse Rate 80     Resp 18     Temp      Temp src      SpO2 97 %     Weight 175 lb 0.7 oz (79.4 kg)     Height 6\' 3"  (1.905 m)     Head Circumference      Peak Flow      Pain Score 0     Pain Loc      Pain Edu?      Excl. in GC?     Constitutional: Alert and oriented.  Pale appearing. Eyes: Conjunctivae are normal. Head: Atraumatic. Nose: No congestion/rhinnorhea. Mouth/Throat: Mucous membranes are moist. Neck: Normal ROM Cardiovascular: Normal rate, regular rhythm. Grossly normal heart sounds. Respiratory: Normal respiratory effort.  No retractions. Lungs CTAB. Gastrointestinal: Large bilateral inguinal hernias that are easily reducible.  No tenderness at hernias or otherwise throughout abdomen. No distention.  Stool is dark black and guaiac positive. Genitourinary: Large bilateral inguinal hernias as described above. Musculoskeletal: No lower extremity tenderness, 1+ pitting edema to knees bilaterally. Neurologic:  Normal  speech and language. No gross focal neurologic deficits are appreciated. Skin:  Skin is warm, dry and intact. No rash noted. Psychiatric: Mood and affect are normal. Speech and behavior are normal.  ____________________________________________   LABS (all labs ordered are listed, but only abnormal results are displayed)  Labs Reviewed  BASIC METABOLIC PANEL - Abnormal; Notable for the following components:      Result Value   CO2 13 (*)    Glucose, Bld 243 (*)    BUN 56 (*)    Calcium 8.7 (*)    Anion gap 21 (*)    All other components within normal limits  CBC - Abnormal; Notable for the following components:   WBC 11.7 (*)    RBC 1.61 (*)    Hemoglobin 5.7 (*)  HCT 18.0 (*)    MCV 111.8 (*)    MCH 35.4 (*)    RDW 18.2 (*)    nRBC 0.5 (*)    All other components within normal limits  BRAIN NATRIURETIC PEPTIDE - Abnormal; Notable for the following components:   B Natriuretic Peptide 441.0 (*)    All other components within normal limits  PROTIME-INR - Abnormal; Notable for the following components:   Prothrombin Time 18.2 (*)    INR 1.6 (*)    All other components within normal limits  LACTIC ACID, PLASMA - Abnormal; Notable for the following components:   Lactic Acid, Venous 9.6 (*)    All other components within normal limits  BETA-HYDROXYBUTYRIC ACID - Abnormal; Notable for the following components:   Beta-Hydroxybutyric Acid 1.17 (*)    All other components within normal limits  TROPONIN I (HIGH SENSITIVITY) - Abnormal; Notable for the following components:   Troponin I (High Sensitivity) 1,247 (*)    All other components within normal limits  TROPONIN I (HIGH SENSITIVITY) - Abnormal; Notable for the following components:   Troponin I (High Sensitivity) 1,468 (*)    All other components within normal limits  SARS CORONAVIRUS 2 BY RT PCR (HOSPITAL ORDER, PERFORMED IN [facility])  CULTURE, BLOOD (ROUTINE X 2)  CULTURE, BLOOD (ROUTINE X 2)   URINALYSIS, COMPLETE (UACMP) WITH MICROSCOPIC  LACTIC ACID, PLASMA  PROCALCITONIN  HEPATIC FUNCTION PANEL  CBG MONITORING, ED  TYPE AND SCREEN  PREPARE RBC (CROSSMATCH)  ABO/RH   ____________________________________________  EKG  ED ECG REPORT I, Chesley Noon, the attending physician, personally viewed and interpreted this ECG.   Date: 04/17/2020  EKG Time: 14:10  Rate: 78  Rhythm: normal sinus rhythm  Axis: Normal  Intervals:nonspecific intraventricular conduction delay  ST&T Change: Nonspecific ST/T changes   PROCEDURES  Procedure(s) performed (including Critical Care):  .Critical Care Performed by: Chesley Noon, MD Authorized by: Chesley Noon, MD   Critical care provider statement:    Critical care time (minutes):  75   Critical care time was exclusive of:  Separately billable procedures and treating other patients and teaching time   Critical care was necessary to treat or prevent imminent or life-threatening deterioration of the following conditions:  Circulatory failure   Critical care was time spent personally by me on the following activities:  Discussions with consultants, evaluation of patient's response to treatment, examination of patient, ordering and performing treatments and interventions, ordering and review of laboratory studies, ordering and review of radiographic studies, pulse oximetry, re-evaluation of patient's condition, obtaining history from patient or surrogate and review of old charts   I assumed direction of critical care for this patient from another provider in my specialty: no     Care discussed with: admitting provider       ____________________________________________   INITIAL IMPRESSION / ASSESSMENT AND PLAN / ED COURSE       87 year old male with past medical history of hypertension, diabetes, atrial fibrillation, and stroke who presents to the ED for generalized weakness for the past 4 days with dark black stools  recently.  Patient is not in any respiratory distress and is maintaining O2 sats on room air, although he reportedly became hypoxic with ambulation.  His shortness of breath is likely due in part to anemia as he has a hemoglobin of 5.7.  This appears due to GI bleeding with guaiac positive melanotic stool.  We will transfuse 1 unit PRBCs for now, patient currently hemodynamically  stable and it appears bleeding has been going on slowly for a few days.  Patient is adamant that he is no longer taking a blood thinner, I spoke with his son over the phone who will confirm this with his medications at home.  Case discussed with Dr. Maximino Greenland of GI, who agrees with plan for Protonix twice daily and likely endoscopy tomorrow, no reason to suspect variceal bleeding at this time.  We will further assess his apparent large inguinal hernias as well as source of possible GI bleeding with CTA of abdomen/pelvis.  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  Given this finding, we will start patient on Rocephin and octreotide.  He remains hemodynamically stable at this time.  Additional labs show elevated troponin at greater than 1200, patient denies any chest pain or shortness of breath at this time.  Case discussed with Dr. Juliann Pares of cardiology, unfortunately anticoagulation is contraindicated and we will need to manage any chest pain with nitroglycerin or beta blockers.  His anemia could be contributing to his NSTEMI.  Chest x-ray reviewed by me and shows large left-sided pleural effusion, potentially related to patient's liver disease.  Low suspicion for pneumonia at this time.  Patient has an elevated beta hydroxybutyrate along with low bicarb, although I suspect his elevated lactate is contributing to acidosis rather than significant DKA.  Case discussed with ICU provider and we will hold off on insulin  drip for now.  Patient accepted to the ICU for admission.      ____________________________________________   FINAL CLINICAL IMPRESSION(S) / ED DIAGNOSES  Final diagnoses:  Upper GI bleed  Bleeding gastric varices  NSTEMI (non-ST elevated myocardial infarction) [facility])     ED Discharge Orders    None       Note:  This document was prepared using Dragon voice recognition software and may include unintentional dictation errors.   Chesley Noon, MD 04/17/20 1924

## 2020-04-17 NOTE — ED Notes (Signed)
Pt's son updated on plan of care.

## 2020-04-17 NOTE — ED Triage Notes (Signed)
Pt here with weakness. Pt states that he hasn't been able to walk since Tuesday. Pt has hx of DM. Pt in NAD in triage.

## 2020-04-17 NOTE — ED Notes (Signed)
Son at bedside. Blood consent discussed with both pt and son. All questions answered per request and discussed blood transfusion with both pt and son, all questions answered. Paper consent signed by son per request of his pt stating "I feel weak please sign for me".

## 2020-04-17 NOTE — H&P (Incomplete)
NAME:  Elijah [name], MRN:  [id number], DOB:  Oct 24, 1933, LOS: 0 ADMISSION DATE:  04/17/2020, CONSULTATION DATE: 04/17/2020 REFERRING MD: Dr. Larinda Buttery, CHIEF COMPLAINT: Weakness  Brief History:  87 year old male presenting with an acute GI bleed and symptomatic anemia requiring blood transfusion admitted to the ICU.  History of Present Illness:  87 year old male presenting to the ED with complaints of weakness starting roughly 4 days ago.  Patient describes progressive fatigue and weakness that made it difficult to get up out of bed, ultimately EMS was called to his home.  He admits to nausea after eating but no vomiting and no diarrhea.  The patient denies pain of any kind including chest pain but does admit to shortness of breath on exertion.  He reports black stools, unclear how many, over the course of the last " few days".  He denies hemoptysis, hematochezia/hematemesis.  He reported to Dr. Larinda Buttery in the ED that while he has been on Eliquis in the past he has not taken it in quite a while.  Additionally this was confirmed with the patient's son by Dr. Larinda Buttery in the ED.  ED physician spoke with Dr. Maximino Greenland with GI who plans for an endoscopy on 04/18/2020, and also mentioned probable diagnostic/therapeutic paracentesis.  The patient's troponins were also elevated and Dr. Larinda Buttery in the ED consulted Dr. Juliann Pares of cardiology.  Due to GI bleed however anticoagulation is contraindicated, recommendations include managing chest pain with nitroglycerin/beta-blockers. Initial vitals: Afebrile, RR-18, HR-80, BP-119/54, 97% on room air. Significant labs: Serum CO2-13, BUN-56, Cr-1.12, AG-21, AST-63, bilirubin direct-0.3, BNP-441, troponin-1247 > 1468, WBC-11.7, hemoglobin 5.7, PCT-0.24, lactic 9.6 > 9.3, beta hydroxy-1.17 PCCM consulted, admit to stepdown for monitoring. Past Medical History:  Hypertension Type 2 diabetes mellitus Atrial fibrillation Stroke  Significant Hospital Events:   04/17/2020-admit to ICU  Consults:  Gastroenterology Cardiology  Procedures:    Significant Diagnostic Tests:  04/17/2020 CTA abdomen pelvis >> no evidence of active arterial GI bleeding.  Cirrhosis with stigmata of portal hypertension including moderate volume ascites and large gastric varices with gastrorenal shunt, small esophageal varices also noted, portal and splenic vein are patent.  Large partially visualized left-sided pleural effusion with collapse of the left lower lobe.  Very large bilateral inguinal hernias containing a large volume of ascites.  Micro Data:  04/17/2020 COVID-19 >> negative 04/17/2020 BC x2 >>  Antimicrobials:  04/17/2020 ceftriaxone >>  Interim History / Subjective:  Patient lying curled up in bed on side, very hard of hearing but alert and responsive to questions.  Patient denies pain, including chest pain at this time.  Only current complaint is weakness, and he wants to know when he will be able to go home.  We discussed the blood transfusion he is receiving, GI and cardiology involvement.  All questions and concerns answered.  Labs/ Imaging personally reviewed Na+/ K+: 140/ 4.4 BUN/Cr.: 56/ 1.12 Hgb: 5.7 AST- 63 BNP-441 troponin-1247 > 1468 WBC/ TMAX: 11.7/ afebrile-36.4 Lactic/ PCT: 9.6 > 9.3/ 0.24  CXR 04/17/2020: Large layering left pleural effusion with underlying consolidation and atelectasis. Objective   Blood pressure 111/66, pulse 88, temperature (!) 97.5 F (36.4 C), temperature source Axillary, resp. rate 18, height 6\' 3"  (1.905 m), weight 79.4 kg, SpO2 100 %.        Intake/Output Summary (Last 24 hours) at 04/17/2020 2326 Last data filed at 04/17/2020 2037 Gross per 24 hour  Intake 460 ml  Output -  Net 460 ml   Filed Weights   04/17/20 1403  Weight: 79.4 kg    Examination: General: Adult male, critically ill, lying in bed, frail-appearing, NAD HEENT: MM pink/dry, anicteric, atraumatic, neck supple, very hard of  hearing Neuro: *A&O x 3, able to follow commands, PERRL +3 , MAE CV: s1s2 RRR,  on monitor, no r/m/g Pulm: Regular, non labored on *** , breath sounds ***-BUL & ***-BLL GI: soft, ***, non***tender, bs x 4 GU: foley in place *** with clear yellow urine Skin: *** no rashes/lesions noted Extremities: warm/dry, pulses + 2 R/P, *** edema noted  Resolved Hospital Problem list     Assessment & Plan:  ***  Best practice (evaluated daily)  Diet: N.p.o. Pain/Anxiety/Delirium protocol (if indicated): N/A VAP protocol (if indicated): N/A DVT prophylaxis: SCDs GI prophylaxis: Protonix drip Glucose control: Monitor every 4 Mobility: Bedrest Disposition: SDU  Goals of Care:  Last date of multidisciplinary goals of care discussion: 04/17/2020 Family and staff present: APP & patient Summary of discussion: Discussed plan of care including GI consultation & cardiology consultation. Follow up goals of care discussion due: 04/18/2020 Code Status: Full  Labs   CBC: Recent Labs  Lab 04/17/20 1419  WBC 11.7*  HGB 5.7*  HCT 18.0*  MCV 111.8*  PLT 191    Basic Metabolic Panel: Recent Labs  Lab 04/17/20 1419  NA 140  K 4.4  CL 106  CO2 13*  GLUCOSE 243*  BUN 56*  CREATININE 1.12  CALCIUM 8.7*   GFR: Estimated Creatinine Clearance: 55.1 mL/min (by C-G formula based on SCr of 1.12 mg/dL). Recent Labs  Lab 04/17/20 1419 04/17/20 1714 04/17/20 1827 04/17/20 1931  PROCALCITON  --   --  0.24  --   WBC 11.7*  --   --   --   LATICACIDVEN  --  9.6*  --  9.3*    Liver Function Tests: Recent Labs  Lab 04/17/20 1419  AST 63*  ALT 33  ALKPHOS 110  BILITOT 1.1  PROT 6.5  ALBUMIN 2.5*   No results for input(s): LIPASE, AMYLASE in the last 168 hours. No results for input(s): AMMONIA in the last 168 hours.  ABG No results found for: PHART, PCO2ART, PO2ART, HCO3, TCO2, ACIDBASEDEF, O2SAT   Coagulation Profile: Recent Labs  Lab 04/17/20 1714  INR 1.6*    Cardiac  Enzymes: No results for input(s): CKTOTAL, CKMB, CKMBINDEX, TROPONINI in the last 168 hours.  HbA1C: No results found for: HGBA1C  CBG: No results for input(s): GLUCAP in the last 168 hours.  Review of Systems: Positives in BOLD  Gen: Denies fever, chills, weight change, fatigue, night sweats HEENT: Denies blurred vision, double vision, hearing loss, tinnitus, sinus congestion, rhinorrhea, sore throat, neck stiffness, dysphagia PULM: Denies shortness of breath on exertion, cough, sputum production, hemoptysis, wheezing CV: Denies chest pain, edema, orthopnea, paroxysmal nocturnal dyspnea, palpitations GI: Denies abdominal pain, nausea, vomiting, diarrhea, hematochezia, melena, constipation, change in bowel habits GU: Denies dysuria, hematuria, polyuria, oliguria, urethral discharge Endocrine: Denies hot or cold intolerance, polyuria, polyphagia or appetite change Derm: Denies rash, dry skin, scaling or peeling skin change Heme: Denies easy bruising, bleeding, bleeding gums Neuro: Denies headache, numbness, weakness, slurred speech, loss of memory or consciousness Past Medical History:  He,  has a past medical history of Diabetes mellitus without complication (HCC).   Surgical History:   Past Surgical History:  Procedure Laterality Date  . SHOULDER SURGERY Right      Social History:   reports that he has never smoked. He has never used smokeless tobacco.  He reports that he does not drink alcohol and does not use drugs.   Family History:  His family history is not on file.   Allergies No Known Allergies   Home Medications  Prior to Admission medications   Medication Sig Start Date End Date Taking? Authorizing Provider  acetaminophen (TYLENOL) 325 MG tablet Take 650 mg by mouth every 4 (four) hours as needed for moderate pain or fever.    [provider]  acetaminophen (TYLENOL) 650 MG suppository Place 650 mg rectally every 4 (four) hours as needed for moderate pain  or fever.    [provider]  amLODipine (NORVASC) 10 MG tablet Take 10 mg by mouth at bedtime.    [provider]  apixaban (ELIQUIS) 5 MG TABS tablet Take 5 mg by mouth 2 (two) times daily.    [provider]  atorvastatin (LIPITOR) 40 MG tablet Take 40 mg by mouth daily. 07/30/13   [provider]  ceFAZolin in dextrose 5 % 50 mL ivpb Inject 2 g into the vein every 8 (eight) hours.    [provider]  glimepiride (AMARYL) 4 MG tablet Take 4 mg by mouth daily. 08/17/13   [provider]  hydrALAZINE (APRESOLINE) 10 MG tablet Take 10 mg by mouth 4 (four) times daily.    [provider]  ipratropium-albuterol (DUONEB) 0.5-2.5 (3) MG/3ML SOLN Take 3 mLs by nebulization every 4 (four) hours as needed (wheezing).    [provider]  lidocaine (LIDODERM) 5 % Place 2 patches onto the skin daily. Remove & Discard patch within 12 hours or as directed by MD 10/10/13   Angiulli, Mcarthur Rossetti, PA-C  megestrol (MEGACE) 400 MG/10ML suspension Take 400 mg by mouth 2 (two) times daily.    [provider]  metFORMIN (GLUCOPHAGE) 500 MG tablet Take 500 mg by mouth 4 (four) times daily. 08/08/13   [provider]  nitrofurantoin, macrocrystal-monohydrate, (MACROBID) 100 MG capsule Take 1 capsule (100 mg total) by mouth 2 (two) times daily. 11/11/18   Verlee Monte, NP  ondansetron [facility]) 4 MG/5ML solution Take 4 mg by mouth every 4 (four) hours as needed for nausea or vomiting.    [provider]  pioglitazone (ACTOS) 30 MG tablet Take 30 mg by mouth daily. 07/30/13   [provider]  traMADol (ULTRAM) 50 MG tablet Take 1 tablet (50 mg total) by mouth every 12 (twelve) hours as needed for severe pain. 10/10/13   Angiulli, Mcarthur Rossetti, PA-C  insulin detemir (LEVEMIR) 100 UNIT/ML injection Inject 20 Units into the skin at bedtime.  11/11/18  [provider]  metoprolol tartrate (LOPRESSOR) 25 MG tablet Take 25 mg by  mouth 3 (three) times daily.   11/11/18  [provider]     Critical care time: 55 minutes       Betsey Holiday, AGACNP-BC Acute Care Nurse Practitioner [facility]   [phone] / [phone] Please see Amion for pager details.

## 2020-04-17 NOTE — ED Notes (Addendum)
Pt presents to ED with c/o of feeling weak over the past 4-5 days. Pt does present with a pallor color at this time. Pt denies fevers or chills. Pt states having occasional dark stools, pt denies any bloody vomitus at this time. Pt has a large amount of swelling near groin area which represents a possible hernia. Pt denies any issues with urinating at this time. Pt is A&Ox4. Pt states he lives at home with his son at this time. Pt states occasional SOB on exertion but not at rest. Pt denies taking blood thinners to this RN.

## 2020-04-17 NOTE — H&P (Signed)
NAME:  Elijah [name], MRN:  [id number], DOB:  1934-03-08, LOS: 0 ADMISSION DATE:  04/17/2020, CONSULTATION DATE: 04/17/2020 REFERRING MD: Dr. Larinda Buttery, CHIEF COMPLAINT: Weakness  Brief History:  87 year old male presenting with an acute GI bleed and symptomatic anemia requiring blood transfusion admitted to the ICU.  History of Present Illness:  87 year old male presenting to the ED with complaints of weakness starting roughly 4 days ago.  Patient describes progressive fatigue and weakness that made it difficult to get up out of bed, ultimately EMS was called to his home.  He admits to nausea after eating but no vomiting and no diarrhea.  The patient denies pain of any kind including chest pain but does admit to shortness of breath on exertion.  He reports black stools, unclear how many, over the course of the last " few days".  He denies hemoptysis, hematochezia/hematemesis.  He reported to Dr. Larinda Buttery in the ED that while he has been on Eliquis in the past he has not taken it in quite a while.  Additionally this was confirmed with the patient's son by Dr. Larinda Buttery in the ED.  ED physician spoke with Dr. Maximino Greenland with GI who plans for an endoscopy on 04/18/2020, and also mentioned probable diagnostic/therapeutic paracentesis.  The patient's troponins were also elevated and Dr. Larinda Buttery in the ED consulted Dr. Juliann Pares of cardiology.  Due to GI bleed however anticoagulation is contraindicated, recommendations include managing chest pain with nitroglycerin/beta-blockers. Initial vitals: Afebrile, RR-18, HR-80, BP-119/54, 97% on room air. Significant labs: Serum CO2-13, BUN-56, Cr-1.12, AG-21, AST-63, bilirubin direct-0.3, BNP-441, troponin-1247 > 1468, WBC-11.7, hemoglobin 5.7, PCT-0.24, lactic 9.6 > 9.3, beta hydroxy-1.17 PCCM consulted, admit to stepdown for monitoring. Past Medical History:  Hypertension Type 2 diabetes mellitus Atrial fibrillation Stroke  Significant Hospital Events:   04/17/2020-admit to ICU  Consults:  Gastroenterology Cardiology  Procedures:    Significant Diagnostic Tests:  04/17/2020 CTA abdomen pelvis >> no evidence of active arterial GI bleeding.  Cirrhosis with stigmata of portal hypertension including moderate volume ascites and large gastric varices with gastrorenal shunt, small esophageal varices also noted, portal and splenic vein are patent.  Large partially visualized left-sided pleural effusion with collapse of the left lower lobe.  Very large bilateral inguinal hernias containing a large volume of ascites.  Micro Data:  04/17/2020 COVID-19 >> negative 04/17/2020 BC x2 >>  Antimicrobials:  04/17/2020 ceftriaxone >>  Interim History / Subjective:  Patient lying curled up in bed on side, very hard of hearing but alert and responsive to questions.  Patient denies pain, including chest pain at this time.  Only current complaint is weakness, and he wants to know when he will be able to go home.  We discussed the blood transfusion he is receiving, GI and cardiology involvement.  All questions and concerns answered.  Labs/ Imaging personally reviewed Na+/ K+: 140/ 4.4 BUN/Cr.: 56/ 1.12 Hgb: 5.7 AST- 63 BNP-441 troponin-1247 > 1468 WBC/ TMAX: 11.7/ afebrile-36.4 Lactic/ PCT: 9.6 > 9.3/ 0.24  CXR 04/17/2020: Large layering left pleural effusion with underlying consolidation and atelectasis. Objective   Blood pressure 111/66, pulse 88, temperature (!) 97.5 F (36.4 C), temperature source Axillary, resp. rate 18, height 6\' 3"  (1.905 m), weight 79.4 kg, SpO2 100 %.        Intake/Output Summary (Last 24 hours) at 04/17/2020 2326 Last data filed at 04/17/2020 2037 Gross per 24 hour  Intake 460 ml  Output --  Net 460 ml   Filed Weights   04/17/20 1403  Weight: 79.4 kg    Examination: General: Adult male, critically ill, lying in bed, frail-appearing, NAD HEENT: MM pink/dry, anicteric, atraumatic, neck supple, very hard of  hearing Neuro: *A&O x 3, able to follow commands, PERRL +3 , MAE CV: s1s2 RRR, NSR on monitor, no r/m/g Pulm: Regular, dsypneic on room air, breath sounds clear diminished-BUL & diminished-BLL (fine crackles in LLL) GI: Distended with ascites, non tender, bs x 4 Skin: scattered ecchymosis no rashes/lesions noted Extremities: warm/dry, pulses + 2 R/P, +1-+2 edema noted BLE  Resolved Hospital Problem list     Assessment & Plan:  Acute gastrointestinal bleed Symptomatic anemia Patient reports black stools at home for roughly the last 2-4 days, amount of stools is unclear.  Patient has had 1 black stool this evening in the ED that was guaiac positive.  Hemoglobin 5.7 on arrival, 1 unit of packed red blood cells infusing. ED physician spoke with Dr. Maximino Greenland, with plans for endoscopy on 04/18/2020 as well as possible need for paracentesis.  CTA negative for arterial bleed, does show cirrhosis/portal hypertension/gastric & esophageal varices -GI consulted appreciate input -Patient n.p.o., plan for EGD 04/18/2020 -Every 6 H&H following blood transfusion - Monitor for s/s of bleeding - Daily CBC - Transfuse for Hgb <7 - Protonix drip ordered -Continue octreotide drip  Elevated troponin: NSTEMI vs demand ischemia Troponin: 1247 > 1468.  Unable to start anticoagulation in the setting of acute GI bleed.  Dr. Juliann Pares consulted by ED physician, plan to manage any new chest pain with nitroglycerin/beta-blockers.  Patient has no complaint of chest pain at this time, does admit to shortness of breath with exertion.  BNP also elevated at 441. -Continuous cardiac monitoring -Trend troponin -Echocardiogram ordered -Consult cardiology, appreciate input  Metabolic acidosis Lactic Acidosis Lactic 9.6 > 9.3, multifocal in the setting of liver dysfunction & Metformin use outpatient.  Lactic acidosis may be contributing to metabolic acidosis, however beta hydroxy elevated: 1.17, AG-21, serum CO2 13.  1 L LR  IV fluid infusing slowly due to concerns for fluid volume overload upon arrival to ED. patient also receiving a unit of blood for severe anemia. -VBG ordered, consider sodium bicarbonate if needed -Trend beta hydroxy -Monitor daily CMP, replace electrolytes as needed -Trend lactic, gentle IV fluid hydration as needed  Suspected spontaneous bacterial peritonitis -IR consulted for diagnostic/therapeutic paracentesis with appropriate labs ordered -Will need albumin infusion post -ceftriaxone continued for SBP coverage -Trend PCT, lactic - f/u cultures -Monitor WBC/fever curve  Large layered left pleural effusion -Supplemental O2 to maintain SPO2 > 90% -Continuous pulse oximetry -Consider thoracentesis  -Intermittent chest x-ray as needed  Best practice (evaluated daily)  Diet: N.p.o. Pain/Anxiety/Delirium protocol (if indicated): N/A VAP protocol (if indicated): N/A DVT prophylaxis: SCDs GI prophylaxis: Protonix drip Glucose control: Monitor every 4 Mobility: Bedrest Disposition: SDU  Goals of Care:  Last date of multidisciplinary goals of care discussion: 04/17/2020 Family and staff present: APP & patient Summary of discussion: Discussed plan of care including GI consultation & cardiology consultation. Follow up goals of care discussion due: 04/18/2020 Code Status: Full  Labs   CBC: Recent Labs  Lab 04/17/20 1419  WBC 11.7*  HGB 5.7*  HCT 18.0*  MCV 111.8*  PLT 191    Basic Metabolic Panel: Recent Labs  Lab 04/17/20 1419  NA 140  K 4.4  CL 106  CO2 13*  GLUCOSE 243*  BUN 56*  CREATININE 1.12  CALCIUM 8.7*   GFR: Estimated Creatinine Clearance: 55.1 mL/min (by C-G formula  based on SCr of 1.12 mg/dL). Recent Labs  Lab 04/17/20 1419 04/17/20 1714 04/17/20 1827 04/17/20 1931  PROCALCITON  --   --  0.24  --   WBC 11.7*  --   --   --   LATICACIDVEN  --  9.6*  --  9.3*    Liver Function Tests: Recent Labs  Lab 04/17/20 1419  AST 63*  ALT 33   ALKPHOS 110  BILITOT 1.1  PROT 6.5  ALBUMIN 2.5*   No results for input(s): LIPASE, AMYLASE in the last 168 hours. No results for input(s): AMMONIA in the last 168 hours.  ABG No results found for: PHART, PCO2ART, PO2ART, HCO3, TCO2, ACIDBASEDEF, O2SAT   Coagulation Profile: Recent Labs  Lab 04/17/20 1714  INR 1.6*    Cardiac Enzymes: No results for input(s): CKTOTAL, CKMB, CKMBINDEX, TROPONINI in the last 168 hours.  HbA1C: No results found for: HGBA1C  CBG: No results for input(s): GLUCAP in the last 168 hours.  Review of Systems: Positives in BOLD  Gen: Denies fever, chills, weight change, fatigue, night sweats HEENT: Denies blurred vision, double vision, hearing loss, tinnitus, sinus congestion, rhinorrhea, sore throat, neck stiffness, dysphagia PULM: Denies shortness of breath on exertion, cough, sputum production, hemoptysis, wheezing CV: Denies chest pain, edema, orthopnea, paroxysmal nocturnal dyspnea, palpitations GI: Denies abdominal pain, nausea, vomiting, diarrhea, hematochezia, melena, constipation, change in bowel habits GU: Denies dysuria, hematuria, polyuria, oliguria, urethral discharge Endocrine: Denies hot or cold intolerance, polyuria, polyphagia or appetite change Derm: Denies rash, dry skin, scaling or peeling skin change Heme: Denies easy bruising, bleeding, bleeding gums Neuro: Denies headache, numbness, weakness, slurred speech, loss of memory or consciousness Past Medical History:  He,  has a past medical history of Diabetes mellitus without complication (HCC).   Surgical History:   Past Surgical History:  Procedure Laterality Date  . SHOULDER SURGERY Right      Social History:   reports that he has never smoked. He has never used smokeless tobacco. He reports that he does not drink alcohol and does not use drugs.   Family History:  His family history is not on file.   Allergies No Known Allergies   Home Medications  Prior to  Admission medications   Medication Sig Start Date End Date Taking? Authorizing Provider  acetaminophen (TYLENOL) 325 MG tablet Take 650 mg by mouth every 4 (four) hours as needed for moderate pain or fever.    [provider]  acetaminophen (TYLENOL) 650 MG suppository Place 650 mg rectally every 4 (four) hours as needed for moderate pain or fever.    [provider]  amLODipine (NORVASC) 10 MG tablet Take 10 mg by mouth at bedtime.    [provider]  apixaban (ELIQUIS) 5 MG TABS tablet Take 5 mg by mouth 2 (two) times daily.    [provider]  atorvastatin (LIPITOR) 40 MG tablet Take 40 mg by mouth daily. 07/30/13   [provider]  ceFAZolin in dextrose 5 % 50 mL ivpb Inject 2 g into the vein every 8 (eight) hours.    [provider]  glimepiride (AMARYL) 4 MG tablet Take 4 mg by mouth daily. 08/17/13   [provider]  hydrALAZINE (APRESOLINE) 10 MG tablet Take 10 mg by mouth 4 (four) times daily.    [provider]  ipratropium-albuterol (DUONEB) 0.5-2.5 (3) MG/3ML SOLN Take 3 mLs by nebulization every 4 (four) hours as needed (wheezing).    [provider]  lidocaine (  LIDODERM) 5 % Place 2 patches onto the skin daily. Remove & Discard patch within 12 hours or as directed by MD 10/10/13   Angiulli, Mcarthur Rossetti, PA-C  megestrol (MEGACE) 400 MG/10ML suspension Take 400 mg by mouth 2 (two) times daily.    [provider]  metFORMIN (GLUCOPHAGE) 500 MG tablet Take 500 mg by mouth 4 (four) times daily. 08/08/13   [provider]  nitrofurantoin, macrocrystal-monohydrate, (MACROBID) 100 MG capsule Take 1 capsule (100 mg total) by mouth 2 (two) times daily. 11/11/18   Verlee Monte, NP  ondansetron [facility]) 4 MG/5ML solution Take 4 mg by mouth every 4 (four) hours as needed for nausea or vomiting.    [provider]  pioglitazone (ACTOS) 30 MG tablet Take 30 mg by mouth daily. 07/30/13   [provider]  traMADol (ULTRAM) 50 MG tablet Take 1 tablet (50 mg total) by mouth every 12 (twelve) hours as needed for severe pain. 10/10/13   Angiulli, Mcarthur Rossetti, PA-C  insulin detemir (LEVEMIR) 100 UNIT/ML injection Inject 20 Units into the skin at bedtime.  11/11/18  [provider]  metoprolol tartrate (LOPRESSOR) 25 MG tablet Take 25 mg by mouth 3 (three) times daily.   11/11/18  [provider]     Critical care time: 55 minutes       Betsey Holiday, AGACNP-BC Acute Care Nurse Practitioner [facility]   [phone] / [phone] Please see Amion for pager details.

## 2020-04-18 ENCOUNTER — Inpatient Hospital Stay: Payer: Medicare Other

## 2020-04-18 ENCOUNTER — Encounter: Payer: Self-pay | Admitting: Pulmonary Disease

## 2020-04-18 ENCOUNTER — Inpatient Hospital Stay: Payer: Medicare Other | Admitting: Anesthesiology

## 2020-04-18 ENCOUNTER — Inpatient Hospital Stay (HOSPITAL_COMMUNITY)
Admission: AD | Admit: 2020-04-18 | Discharge: 2020-04-27 | DRG: 405 | Disposition: A | Discharge status: Home Health | Payer: Medicare Other | Source: Other Acute Inpatient Hospital | Attending: Internal Medicine | Admitting: Internal Medicine

## 2020-04-18 ENCOUNTER — Other Ambulatory Visit: Payer: Self-pay

## 2020-04-18 ENCOUNTER — Encounter
Admission: EM | Disposition: A | Discharge status: Other Acute Inpatient Hospital | Payer: Self-pay | Source: Home / Self Care | Attending: Pulmonary Disease

## 2020-04-18 DIAGNOSIS — K4020 Bilateral inguinal hernia, without obstruction or gangrene, not specified as recurrent: Secondary | ICD-10-CM | POA: Diagnosis present

## 2020-04-18 DIAGNOSIS — I350 Nonrheumatic aortic (valve) stenosis: Secondary | ICD-10-CM | POA: Diagnosis not present

## 2020-04-18 DIAGNOSIS — I10 Essential (primary) hypertension: Secondary | ICD-10-CM | POA: Diagnosis present

## 2020-04-18 DIAGNOSIS — J90 Pleural effusion, not elsewhere classified: Secondary | ICD-10-CM | POA: Diagnosis present

## 2020-04-18 DIAGNOSIS — K7469 Other cirrhosis of liver: Secondary | ICD-10-CM | POA: Diagnosis present

## 2020-04-18 DIAGNOSIS — I214 Non-ST elevation (NSTEMI) myocardial infarction: Secondary | ICD-10-CM | POA: Diagnosis present

## 2020-04-18 DIAGNOSIS — E785 Hyperlipidemia, unspecified: Secondary | ICD-10-CM | POA: Diagnosis present

## 2020-04-18 DIAGNOSIS — I21A1 Myocardial infarction type 2: Secondary | ICD-10-CM | POA: Diagnosis present

## 2020-04-18 DIAGNOSIS — Z8673 Personal history of transient ischemic attack (TIA), and cerebral infarction without residual deficits: Secondary | ICD-10-CM | POA: Diagnosis not present

## 2020-04-18 DIAGNOSIS — R578 Other shock: Secondary | ICD-10-CM | POA: Diagnosis present

## 2020-04-18 DIAGNOSIS — R188 Other ascites: Secondary | ICD-10-CM

## 2020-04-18 DIAGNOSIS — I351 Nonrheumatic aortic (valve) insufficiency: Secondary | ICD-10-CM | POA: Diagnosis not present

## 2020-04-18 DIAGNOSIS — Z20822 Contact with and (suspected) exposure to covid-19: Secondary | ICD-10-CM | POA: Diagnosis present

## 2020-04-18 DIAGNOSIS — I8511 Secondary esophageal varices with bleeding: Secondary | ICD-10-CM | POA: Diagnosis present

## 2020-04-18 DIAGNOSIS — K862 Cyst of pancreas: Secondary | ICD-10-CM | POA: Diagnosis present

## 2020-04-18 DIAGNOSIS — N179 Acute kidney failure, unspecified: Secondary | ICD-10-CM | POA: Diagnosis present

## 2020-04-18 DIAGNOSIS — D7589 Other specified diseases of blood and blood-forming organs: Secondary | ICD-10-CM | POA: Diagnosis present

## 2020-04-18 DIAGNOSIS — D62 Acute posthemorrhagic anemia: Secondary | ICD-10-CM | POA: Diagnosis present

## 2020-04-18 DIAGNOSIS — R601 Generalized edema: Secondary | ICD-10-CM | POA: Diagnosis not present

## 2020-04-18 DIAGNOSIS — Z66 Do not resuscitate: Secondary | ICD-10-CM | POA: Diagnosis present

## 2020-04-18 DIAGNOSIS — I864 Gastric varices: Secondary | ICD-10-CM | POA: Diagnosis present

## 2020-04-18 DIAGNOSIS — Z7984 Long term (current) use of oral hypoglycemic drugs: Secondary | ICD-10-CM | POA: Diagnosis not present

## 2020-04-18 DIAGNOSIS — Z794 Long term (current) use of insulin: Secondary | ICD-10-CM

## 2020-04-18 DIAGNOSIS — K7290 Hepatic failure, unspecified without coma: Secondary | ICD-10-CM | POA: Diagnosis not present

## 2020-04-18 DIAGNOSIS — E1165 Type 2 diabetes mellitus with hyperglycemia: Secondary | ICD-10-CM | POA: Diagnosis present

## 2020-04-18 DIAGNOSIS — Z006 Encounter for examination for normal comparison and control in clinical research program: Secondary | ICD-10-CM

## 2020-04-18 DIAGNOSIS — K922 Gastrointestinal hemorrhage, unspecified: Secondary | ICD-10-CM | POA: Diagnosis not present

## 2020-04-18 DIAGNOSIS — D6959 Other secondary thrombocytopenia: Secondary | ICD-10-CM | POA: Diagnosis present

## 2020-04-18 DIAGNOSIS — Z79899 Other long term (current) drug therapy: Secondary | ICD-10-CM | POA: Diagnosis not present

## 2020-04-18 DIAGNOSIS — K766 Portal hypertension: Secondary | ICD-10-CM | POA: Diagnosis present

## 2020-04-18 DIAGNOSIS — D500 Iron deficiency anemia secondary to blood loss (chronic): Secondary | ICD-10-CM

## 2020-04-18 DIAGNOSIS — N400 Enlarged prostate without lower urinary tract symptoms: Secondary | ICD-10-CM | POA: Diagnosis present

## 2020-04-18 DIAGNOSIS — K7460 Unspecified cirrhosis of liver: Secondary | ICD-10-CM | POA: Diagnosis not present

## 2020-04-18 DIAGNOSIS — D689 Coagulation defect, unspecified: Secondary | ICD-10-CM | POA: Diagnosis present

## 2020-04-18 DIAGNOSIS — R58 Hemorrhage, not elsewhere classified: Secondary | ICD-10-CM

## 2020-04-18 DIAGNOSIS — R079 Chest pain, unspecified: Secondary | ICD-10-CM | POA: Diagnosis not present

## 2020-04-18 DIAGNOSIS — K921 Melena: Secondary | ICD-10-CM | POA: Diagnosis present

## 2020-04-18 DIAGNOSIS — K31811 Angiodysplasia of stomach and duodenum with bleeding: Secondary | ICD-10-CM | POA: Diagnosis not present

## 2020-04-18 HISTORY — PX: ESOPHAGOGASTRODUODENOSCOPY (EGD) WITH PROPOFOL: SHX5813

## 2020-04-18 LAB — CBC
HCT: 18.5 % — ABNORMAL LOW (ref 39.0–52.0)
Hemoglobin: 5.9 g/dL — ABNORMAL LOW (ref 13.0–17.0)
MCH: 33.3 pg (ref 26.0–34.0)
MCHC: 31.9 g/dL (ref 30.0–36.0)
MCV: 104.5 fL — ABNORMAL HIGH (ref 80.0–100.0)
Platelets: 124 10*3/uL — ABNORMAL LOW (ref 150–400)
RBC: 1.77 MIL/uL — ABNORMAL LOW (ref 4.22–5.81)
RDW: 20.7 % — ABNORMAL HIGH (ref 11.5–15.5)
WBC: 11.2 10*3/uL — ABNORMAL HIGH (ref 4.0–10.5)
nRBC: 0.8 % — ABNORMAL HIGH (ref 0.0–0.2)

## 2020-04-18 LAB — COMPREHENSIVE METABOLIC PANEL
ALT: 31 U/L (ref 0–44)
AST: 60 U/L — ABNORMAL HIGH (ref 15–41)
Albumin: 2.4 g/dL — ABNORMAL LOW (ref 3.5–5.0)
Alkaline Phosphatase: 93 U/L (ref 38–126)
Anion gap: 13 (ref 5–15)
BUN: 64 mg/dL — ABNORMAL HIGH (ref 8–23)
CO2: 19 mmol/L — ABNORMAL LOW (ref 22–32)
Calcium: 8.6 mg/dL — ABNORMAL LOW (ref 8.9–10.3)
Chloride: 111 mmol/L (ref 98–111)
Creatinine, Ser: 1.11 mg/dL (ref 0.61–1.24)
GFR, Estimated: >60 mL/min (ref >60)
Glucose, Bld: 156 mg/dL — ABNORMAL HIGH (ref 70–99)
Potassium: 4.4 mmol/L (ref 3.5–5.1)
Sodium: 143 mmol/L (ref 135–145)
Total Bilirubin: 0.8 mg/dL (ref 0.3–1.2)
Total Protein: 5.8 g/dL — ABNORMAL LOW (ref 6.5–8.1)

## 2020-04-18 LAB — URINALYSIS, COMPLETE (UACMP) WITH MICROSCOPIC
Bacteria, UA: NONE SEEN
Bilirubin Urine: NEGATIVE
Glucose, UA: NEGATIVE mg/dL
Ketones, ur: NEGATIVE mg/dL
Leukocytes,Ua: NEGATIVE
Nitrite: NEGATIVE
Protein, ur: NEGATIVE mg/dL
Specific Gravity, Urine: 1.032 — ABNORMAL HIGH (ref 1.005–1.030)
Squamous Epithelial / HPF: NONE SEEN (ref 0–5)
pH: 5 (ref 5.0–8.0)

## 2020-04-18 LAB — AMYLASE, PLEURAL OR PERITONEAL FLUID: Amylase, Fluid: <5 U/L

## 2020-04-18 LAB — PREPARE RBC (CROSSMATCH)

## 2020-04-18 LAB — BODY FLUID CELL COUNT WITH DIFFERENTIAL
Eos, Fluid: 1 %
Lymphs, Fluid: 54 %
Monocyte-Macrophage-Serous Fluid: 36 %
Neutrophil Count, Fluid: 9 %
Total Nucleated Cell Count, Fluid: 136 cu mm

## 2020-04-18 LAB — CBC WITH DIFFERENTIAL/PLATELET
Abs Immature Granulocytes: 0.04 K/uL (ref 0.00–0.07)
Basophils Absolute: 0 K/uL (ref 0.0–0.1)
Basophils Relative: 0 %
Eosinophils Absolute: 0.1 K/uL (ref 0.0–0.5)
Eosinophils Relative: 1 %
HCT: 21.4 % — ABNORMAL LOW (ref 39.0–52.0)
Hemoglobin: 7.2 g/dL — ABNORMAL LOW (ref 13.0–17.0)
Immature Granulocytes: 0 %
Lymphocytes Relative: 8 %
Lymphs Abs: 0.8 K/uL (ref 0.7–4.0)
MCH: 32.7 pg (ref 26.0–34.0)
MCHC: 33.6 g/dL (ref 30.0–36.0)
MCV: 97.3 fL (ref 80.0–100.0)
Monocytes Absolute: 0.8 K/uL (ref 0.1–1.0)
Monocytes Relative: 7 %
Neutro Abs: 8.9 K/uL — ABNORMAL HIGH (ref 1.7–7.7)
Neutrophils Relative %: 84 %
Platelets: 131 K/uL — ABNORMAL LOW (ref 150–400)
RBC: 2.2 MIL/uL — ABNORMAL LOW (ref 4.22–5.81)
RDW: 19.8 % — ABNORMAL HIGH (ref 11.5–15.5)
WBC: 10.6 K/uL — ABNORMAL HIGH (ref 4.0–10.5)
nRBC: 0.8 % — ABNORMAL HIGH (ref 0.0–0.2)

## 2020-04-18 LAB — ALBUMIN, PLEURAL OR PERITONEAL FLUID: Albumin, Fluid: <1 g/dL

## 2020-04-18 LAB — BETA-HYDROXYBUTYRIC ACID: Beta-Hydroxybutyric Acid: 0.51 mmol/L — ABNORMAL HIGH (ref 0.05–0.27)

## 2020-04-18 LAB — PROTEIN, PLEURAL OR PERITONEAL FLUID: Total protein, fluid: <3 g/dL

## 2020-04-18 LAB — CBG MONITORING, ED
Glucose-Capillary: 147 mg/dL — ABNORMAL HIGH (ref 70–99)
Glucose-Capillary: 136 mg/dL — ABNORMAL HIGH (ref 70–99)

## 2020-04-18 LAB — MAGNESIUM: Magnesium: 1.8 mg/dL (ref 1.7–2.4)

## 2020-04-18 LAB — BLOOD GAS, VENOUS
Acid-base deficit: 9.4 mmol/L — ABNORMAL HIGH (ref 0.0–2.0)
Bicarbonate: 16.3 mmol/L — ABNORMAL LOW (ref 20.0–28.0)
O2 Saturation: 69.2 %
Patient temperature: 37
pCO2, Ven: 34 mmHg — ABNORMAL LOW (ref 44.0–60.0)
pH, Ven: 7.29 (ref 7.250–7.430)
pO2, Ven: 41 mmHg (ref 32.0–45.0)

## 2020-04-18 LAB — IRON AND TIBC
Iron: 26 ug/dL — ABNORMAL LOW (ref 45–182)
Saturation Ratios: 11 % — ABNORMAL LOW (ref 17.9–39.5)
TIBC: 241 ug/dL — ABNORMAL LOW (ref 250–450)
UIBC: 215 ug/dL

## 2020-04-18 LAB — PROCALCITONIN: Procalcitonin: 0.29 ng/mL

## 2020-04-18 LAB — PHOSPHORUS: Phosphorus: 3.9 mg/dL (ref 2.5–4.6)

## 2020-04-18 LAB — TROPONIN I (HIGH SENSITIVITY)
Troponin I (High Sensitivity): 1810 ng/L (ref <18)
Troponin I (High Sensitivity): 2151 ng/L
Troponin I (High Sensitivity): 3792 ng/L (ref <18)

## 2020-04-18 LAB — HEMOGLOBIN AND HEMATOCRIT, BLOOD
HCT: 19 % — ABNORMAL LOW (ref 39.0–52.0)
HCT: 24.9 % — ABNORMAL LOW (ref 39.0–52.0)
Hemoglobin: 6.5 g/dL — ABNORMAL LOW (ref 13.0–17.0)
Hemoglobin: 8.4 g/dL — ABNORMAL LOW (ref 13.0–17.0)

## 2020-04-18 LAB — LACTATE DEHYDROGENASE, PLEURAL OR PERITONEAL FLUID: LD, Fluid: 31 U/L — ABNORMAL HIGH (ref 3–23)

## 2020-04-18 LAB — GLUCOSE, PLEURAL OR PERITONEAL FLUID: Glucose, Fluid: 161 mg/dL

## 2020-04-18 LAB — LACTIC ACID, PLASMA
Lactic Acid, Venous: 5 mmol/L (ref 0.5–1.9)
Lactic Acid, Venous: 2.3 mmol/L (ref 0.5–1.9)

## 2020-04-18 LAB — AMMONIA: Ammonia: 65 umol/L — ABNORMAL HIGH (ref 9–35)

## 2020-04-18 LAB — FERRITIN: Ferritin: 46 ng/mL (ref 24–336)

## 2020-04-18 SURGERY — ESOPHAGOGASTRODUODENOSCOPY (EGD) WITH PROPOFOL
Anesthesia: General

## 2020-04-18 MED ORDER — SODIUM CHLORIDE (PF) 0.9 % IJ SOLN
INTRAMUSCULAR | Status: AC
  Filled 2020-04-18: qty 20

## 2020-04-18 MED ORDER — PROPOFOL 500 MG/50ML IV EMUL
INTRAVENOUS | Status: AC
  Filled 2020-04-18: qty 50

## 2020-04-18 MED ORDER — ATORVASTATIN CALCIUM 20 MG PO TABS: 40.0000 mg | ORAL_TABLET | Freq: Every day | ORAL | Status: DC

## 2020-04-18 MED ORDER — PHENYLEPHRINE HCL (PRESSORS) 10 MG/ML IV SOLN
INTRAVENOUS | Status: DC | PRN
  Administered 2020-04-18 (×2): 100 ug via INTRAVENOUS

## 2020-04-18 MED ORDER — VASOPRESSIN 20 UNIT/ML IV SOLN
INTRAVENOUS | Status: AC
  Filled 2020-04-18: qty 1

## 2020-04-18 MED ORDER — SODIUM CHLORIDE 0.9 % IV SOLN: 50.0000 ug/h | INTRAVENOUS | Status: DC

## 2020-04-18 MED ORDER — INSULIN ASPART 100 UNIT/ML ~~LOC~~ SOLN
0.0000 [IU] | SUBCUTANEOUS | Status: DC
  Administered 2020-04-18: 2 [IU] via SUBCUTANEOUS
  Filled 2020-04-18: qty 1

## 2020-04-18 MED ORDER — SODIUM CHLORIDE 0.9 % IV SOLN: INTRAVENOUS | Status: DC

## 2020-04-18 MED ORDER — LIDOCAINE HCL (CARDIAC) PF 100 MG/5ML IV SOSY
PREFILLED_SYRINGE | INTRAVENOUS | Status: DC | PRN
  Administered 2020-04-18: 80 mg via INTRAVENOUS

## 2020-04-18 MED ORDER — SODIUM CHLORIDE 0.9% IV SOLUTION
Freq: Once | INTRAVENOUS | Status: DC
  Filled 2020-04-18: qty 250

## 2020-04-18 MED ORDER — LIDOCAINE HCL (PF) 2 % IJ SOLN
INTRAMUSCULAR | Status: AC
  Filled 2020-04-18: qty 5

## 2020-04-18 MED ORDER — PROPOFOL 500 MG/50ML IV EMUL
INTRAVENOUS | Status: DC | PRN
  Administered 2020-04-18: 100 ug/kg/min via INTRAVENOUS

## 2020-04-18 MED ORDER — PROPOFOL 10 MG/ML IV BOLUS
INTRAVENOUS | Status: DC | PRN
  Administered 2020-04-18 (×2): 20 mg via INTRAVENOUS

## 2020-04-18 MED ORDER — SODIUM CHLORIDE 0.9 % IV SOLN: 2.0000 g | INTRAVENOUS | Status: DC

## 2020-04-18 MED ORDER — TAMSULOSIN HCL 0.4 MG PO CAPS: 0.4000 mg | ORAL_CAPSULE | Freq: Every day | ORAL | Status: DC

## 2020-04-18 MED ORDER — PANTOPRAZOLE SODIUM 40 MG IV SOLR: 40.0000 mg | Freq: Two times a day (BID) | INTRAVENOUS | Status: DC

## 2020-04-18 MED ORDER — SODIUM CHLORIDE 0.9% IV SOLUTION
Freq: Once | INTRAVENOUS | Status: AC
  Filled 2020-04-18: qty 250

## 2020-04-18 NOTE — Consult Note (Signed)
PHARMACY CONSULT NOTE - FOLLOW UP  Pharmacy Consult for Electrolyte Monitoring and Replacement   Recent Labs: Potassium (mmol/L)  Date Value  04/18/2020 4.4  09/11/2013 4.4   Magnesium (mg/dL)  Date Value  91/50/5697 1.8  09/08/2013 2.5 (H)   Calcium (mg/dL)  Date Value  94/80/1655 8.6 (L)   Calcium, Total (mg/dL)  Date Value  37/48/2707 8.7   Albumin (g/dL)  Date Value  86/75/4492 2.4 (L)   Phosphorus (mg/dL)  Date Value  01/00/7121 3.9   Sodium (mmol/L)  Date Value  04/18/2020 143  09/11/2013 145     Assessment: 86yo Male with PMH sig for HTN, T2DM, Afib, and h/o stroke presenting with complaint of weakness x4days found to have acute GIB and symptomatic anemia. Pharmacy has been consulted to manage electrolytes.   Goal of Therapy:  Lytes WNL  Plan:  No replacement needed at present. Will continue to monitor  Martyn Malay ,PharmD Clinical Pharmacist 04/18/2020 8:34 AM

## 2020-04-18 NOTE — Progress Notes (Signed)
I spoke with Dr. Mia Creek (GI) after EGD showed large gastric varix. He recommends transfer to facility with IR capabilities for TIPS vs BRTO. Call is out to [facility] for consultation. Will look into alternative options if [facility] is unable to take patient.  Marcelo Baldy, MD 04/18/20 5:58 PM

## 2020-04-18 NOTE — Progress Notes (Addendum)
I spoke with Dr. Chipper Herb ([facility]) at [facility] and he has accepted this patient to the [facility] unit there. Will place on bedboard to [facility] here. Patient is still bedded in Endoscopy at this time.  Marcelo Baldy, MD 04/18/20 6:27 PM

## 2020-04-18 NOTE — Progress Notes (Signed)
Recovery care phase complete after EGD. Pt is currently holding in the recovery area of ENDO awaiting approval to transfer pt to his assigned ICU bed.

## 2020-04-18 NOTE — Anesthesia Preprocedure Evaluation (Addendum)
Anesthesia Evaluation  Patient identified by MRN, date of birth, ID band Patient awake    Reviewed: Allergy & Precautions, NPO status , Patient's Chart, lab work & pertinent test results  Airway Mallampati: III  TM Distance: >3 FB     Dental   Pulmonary neg pulmonary ROS,    Pulmonary exam normal        Cardiovascular + CAD       Neuro/Psych negative neurological ROS  negative psych ROS   GI/Hepatic Neg liver ROS, GI bleed   Endo/Other  diabetes  Renal/GU negative Renal ROS  negative genitourinary   Musculoskeletal   Abdominal Normal abdominal exam  (+)   Peds negative pediatric ROS (+)  Hematology  (+) anemia ,   Anesthesia Other Findings Past Medical History: No date: Diabetes mellitus without complication (HCC)  Reproductive/Obstetrics                            Anesthesia Physical Anesthesia Plan  ASA: III and emergent  Anesthesia Plan: General   Post-op Pain Management:    Induction: Intravenous, Rapid sequence and Cricoid pressure planned  PONV Risk Score and Plan:   Airway Management Planned:   Additional Equipment:   Intra-op Plan:   Post-operative Plan: Extubation in OR  Informed Consent: I have reviewed the patients History and Physical, chart, labs and discussed the procedure including the risks, benefits and alternatives for the proposed anesthesia with the patient or authorized representative who has indicated his/her understanding and acceptance.     Dental advisory given  Plan Discussed with: CRNA and Surgeon  Anesthesia Plan Comments:         Anesthesia Quick Evaluation

## 2020-04-18 NOTE — Consult Note (Signed)
Consultation  Referring Provider:     Dr Charna Archer Admit date 04/18/20 Consult date        04/18/20 Reason for Consultation:  melena            HPI:   Elijah [name] is a 87 y.o. male admitted with cirrhosis, melena x 3d, ascites.  Initial hgb 5.7- transfusions have been started.  Pt/inr 18.2, 1.6. he has been started on octreotide, pantoprazole, and rocephin. He has been admitted via the critical care service. Noted some esophageal and large gastric varices on CTA-  this also demonstrates some ascites with bilateral inguinal hernias cirrhosis as above, and a stable pancreatic cyst. There was no active bleeding noted on this exam.  Paracentesis has been planned and pending. His ammonia is elevated. Do note also elevated troponin- cardiology to see. Patient states he is feeling better now. States he had black stools x 3d and felt weak. Denies any abdominal and chest pain. States his abdomen has been large for some time but denies any known history of liver disease. Denies any NV, dysphagia, jaundice, further Gi concerns. Last noted melanotic stool today by staff. Patient denies any etoh/herbals. Denies any  NSAIDs. He is diabetic. Patient states he is unaware of any family members with liver disease at this time, and no known family history of colorectal cancer  PREVIOUS ENDOSCOPIES:            none   Past Medical History:  Diagnosis Date  . Diabetes mellitus without complication [facility])     Past Surgical History:  Procedure Laterality Date  . SHOULDER SURGERY Right     No family history on file.   Social History   Tobacco Use  . Smoking status: Never Smoker  . Smokeless tobacco: Never Used  Vaping Use  . Vaping Use: Never used  Substance Use Topics  . Alcohol use: Never  . Drug use: Never    Prior to Admission medications   Medication Sig Start Date End Date Taking? Authorizing Provider  atorvastatin (LIPITOR) 40 MG tablet Take 40 mg by mouth daily. 07/30/13  Yes [provider]  glimepiride (AMARYL) 4 MG tablet Take 4 mg by mouth daily. 08/17/13  Yes [provider]  metFORMIN (GLUCOPHAGE) 500 MG tablet Take 500 mg by mouth 2 (two) times daily with a meal. 08/08/13  Yes [provider]  pioglitazone (ACTOS) 30 MG tablet Take 30 mg by mouth daily. 07/30/13  Yes [provider]  tamsulosin (FLOMAX) 0.4 MG CAPS capsule Take 0.4 mg by mouth.   Yes [provider]  insulin detemir (LEVEMIR) 100 UNIT/ML injection Inject 20 Units into the skin at bedtime.  11/11/18  [provider]  metoprolol tartrate (LOPRESSOR) 25 MG tablet Take 25 mg by mouth 3 (three) times daily.   11/11/18  [provider]    Current Facility-Administered Medications  Medication Dose Route Frequency Provider Last Rate Last Admin  . 0.9 %  sodium chloride infusion (Manually program via Guardrails IV Fluids)   Intravenous Once Awilda Bill, NP      . Derrill Memo ON 04/19/2020] atorvastatin (LIPITOR) tablet 40 mg  40 mg Oral Daily Bennie Pierini, MD      . cefTRIAXone (ROCEPHIN) 2 g in sodium chloride 0.9 % 100 mL IVPB  2 g Intravenous Q24H Rust-Chester, Britton L, NP      . docusate sodium (COLACE) capsule 100 mg  100 mg Oral BID PRN Rust-Chester, Huel Cote, NP      .  insulin aspart (novoLOG) injection 0-15 Units  0-15 Units Subcutaneous Q4H Schertz, Michele Mcalpine, MD      . octreotide (SANDOSTATIN) 500 mcg in sodium chloride 0.9 % 250 mL (2 mcg/mL) infusion  50 mcg/hr Intravenous Continuous Blake Divine, MD 25 mL/hr at 04/18/20 0557 50 mcg/hr at 04/18/20 0557  . pantoprazole (PROTONIX) 80 mg in sodium chloride 0.9 % 100 mL (0.8 mg/mL) infusion  8 mg/hr Intravenous Continuous Rust-Chester, Britton L, NP 10 mL/hr at 04/18/20 0557 8 mg/hr at 04/18/20 0557  . [START ON 04/21/2020] pantoprazole (PROTONIX) injection 40 mg  40 mg Intravenous Q12H Rust-Chester, Britton L, NP      . polyethylene glycol (MIRALAX / GLYCOLAX) packet 17 g  17 g Oral Daily  PRN Rust-Chester, Huel Cote, NP      . Derrill Memo ON 04/19/2020] tamsulosin (FLOMAX) capsule 0.4 mg  0.4 mg Oral QPC supper Bennie Pierini, MD       Current Outpatient Medications  Medication Sig Dispense Refill  . atorvastatin (LIPITOR) 40 MG tablet Take 40 mg by mouth daily.    Marland Kitchen glimepiride (AMARYL) 4 MG tablet Take 4 mg by mouth daily.    . metFORMIN (GLUCOPHAGE) 500 MG tablet Take 500 mg by mouth 2 (two) times daily with a meal.    . pioglitazone (ACTOS) 30 MG tablet Take 30 mg by mouth daily.    . tamsulosin (FLOMAX) 0.4 MG CAPS capsule Take 0.4 mg by mouth.      Allergies as of 04/17/2020  . (No Known Allergies)     Review of Systems:    All systems reviewed and negative except where noted in HPI.  Marland Kitchen    Physical Exam:  Vital signs in last 24 hours: Temp:  [97.5 F (36.4 C)-97.9 F (36.6 C)] 97.7 F (36.5 C) (01/28 1258) Pulse Rate:  [73-90] 79 (01/28 1258) Resp:  [14-19] 18 (01/28 1258) BP: (108-133)/(47-69) 117/57 (01/28 1258) SpO2:  [96 %-100 %] 98 % (01/28 1258) Weight:  [79.4 kg] 79.4 kg (01/27 1403)   General:   Pleasant elderly man in NAD. Somewhat dishevelved appearance. Head:  Normocephalic and atraumatic. Eyes:   No icterus.   Conjunctiva pink. Ears:  Normal auditory acuity. Mouth: Mucosa pink moist, no lesions. Neck:  Supple; no masses felt Lungs:  Respirations even and unlabored. Lungs clear to auscultation bilaterally.   No wheezes, crackles, or rhonchi.  Heart:  S1S2, RRR, no MRG. 1+ lower extremity edema. Abdomen:   + ascites, firm. Lower abdomen also with ascites that extends into scrotum, nontender. Normal bowel sounds. No appreciable masses. . No rebound signs or other peritoneal signs. Rectal:  Not performed.  Msk:  MAEW x4, No clubbing or cyanosis. Strength 4/5. Symmetrical without gross deformities. Neurologic:  Alert and  oriented x4;  Cranial nerves II-XII intact.  Skin:  Warm, dry, pink without significant lesions or rashes. Psych:  Alert  and cooperative. Normal affect. There are periods of memory loss. He is somewhat HOH  LAB RESULTS: Recent Labs    04/17/20 1419 04/18/20 0100 04/18/20 0615  WBC 11.7*  --  11.2*  HGB 5.7* 6.5* 5.9*  HCT 18.0* 19.0* 18.5*  PLT 191  --  124*   BMET Recent Labs    04/17/20 1419 04/18/20 0515  NA 140 143  K 4.4 4.4  CL 106 111  CO2 13* 19*  GLUCOSE 243* 156*  BUN 56* 64*  CREATININE 1.12 1.11  CALCIUM 8.7* 8.6*   LFT Recent Labs  04/17/20 1419 04/18/20 0515  PROT 6.5 5.8*  ALBUMIN 2.5* 2.4*  AST 63* 60*  ALT 33 31  ALKPHOS 110 93  BILITOT 1.1 0.8  BILIDIR 0.3*  --   IBILI 0.8  --    PT/INR Recent Labs    04/17/20 1714  LABPROT 18.2*  INR 1.6*    STUDIES: DG Chest 2 View  Result Date: 04/17/2020 CLINICAL DATA:  Patient with weakness. EXAM: CHEST - 2 VIEW COMPARISON:  Chest radiograph 09/11/2013. FINDINGS: Stable cardiomegaly. Large layering left pleural effusion with underlying consolidation. No pneumothorax. Thoracic spine degenerative changes. IMPRESSION: Large layering left pleural effusion with underlying consolidation. Electronically Signed   By: Lovey Newcomer M.D.   On: 04/17/2020 18:04   CT Angio Abd/Pel W and/or Wo Contrast  Result Date: 04/17/2020 CLINICAL DATA:  GI bleeding. EXAM: CTA ABDOMEN AND PELVIS WITHOUT AND WITH CONTRAST TECHNIQUE: Multidetector CT imaging of the abdomen and pelvis was performed using the standard protocol during bolus administration of intravenous contrast. Multiplanar reconstructed images and MIPs were obtained and reviewed to evaluate the vascular anatomy. CONTRAST:  130m OMNIPAQUE IOHEXOL 350 MG/ML SOLN COMPARISON:  None. FINDINGS: VASCULAR Aorta: Normal caliber aorta without aneurysm, dissection, vasculitis or significant stenosis. Atherosclerotic changes are noted throughout the abdominal aorta. Celiac: Patent without evidence of aneurysm, dissection, vasculitis or significant stenosis. SMA: Patent without evidence of  aneurysm, dissection, vasculitis or significant stenosis. Renals: Both renal arteries are patent without evidence of aneurysm, dissection, vasculitis, fibromuscular dysplasia or significant stenosis. There are 2 right renal arteries. IMA: Patent without evidence of aneurysm, dissection, vasculitis or significant stenosis. Inflow: Patent without evidence of aneurysm, dissection, vasculitis or significant stenosis. Proximal Outflow: Bilateral common femoral and visualized portions of the superficial and profunda femoral arteries are patent without evidence of aneurysm, dissection, vasculitis or significant stenosis. Veins: No obvious venous abnormality within the limitations of this arterial phase study. Review of the MIP images confirms the above findings. NON-VASCULAR Lower chest: There is a large left-sided pleural effusion that is only partially visualized. There is at least partial collapse of the left lower lobe.The heart size is normal. The intracardiac blood pool is hypodense relative to the adjacent myocardium consistent with anemia. Hepatobiliary: The liver is cirrhotic. There are large gastric varices with a gastro renal shunt. The portal vein is patent. There are esophageal varices. Normal gallbladder.There is no biliary ductal dilation. Pancreas: The pancreas is atrophic. Again noted is a low-attenuation, cystic appearing nodule at the pancreatic tail which is not substantially changed since 2015 is likely a benign process. Spleen: Unremarkable. Adrenals/Urinary Tract: --Adrenal glands: Unremarkable. --Right kidney/ureter: No hydronephrosis or radiopaque kidney stones. --Left kidney/ureter: No hydronephrosis or radiopaque kidney stones. --Urinary bladder: The urinary bladder is significantly distended. Stomach/Bowel: --Stomach/Duodenum: Large gastric varices are noted. --Small bowel: Unremarkable. --Colon: Rectosigmoid diverticulosis without acute inflammation. --Appendix: Normal. Lymphatic: --No  retroperitoneal lymphadenopathy. --No mesenteric lymphadenopathy. --No pelvic or inguinal lymphadenopathy. Reproductive: There are large bilateral inguinal hernias. On the left the inguinal hernia contains a large amount of ascites and portions of the sigmoid colon without evidence for obstruction. On the right, the hernia contains mostly fluid but a small portion of small bowel as well without evidence for obstruction. Other: There is a moderate to large volume of ascites in the abdomen. Musculoskeletal. Multilevel degenerative changes are noted throughout the lumbar spine. IMPRESSION: 1. No evidence for active arterial GI bleeding. 2. Cirrhosis with stigmata of portal hypertension including a moderate volume ascites and large gastric varices with a  gastrorenal shunt. Small esophageal varices are also noted. The portal vein is patent. Splenic vein is patent. 3. Large partially visualized left-sided pleural effusion with collapse of the left lower lobe. 4. Very large bilateral inguinal hernias containing a large volume of ascites and bowel as detailed above. Aortic Atherosclerosis (ICD10-I70.0). Electronically Signed   By: Constance Holster M.D.   On: 04/17/2020 18:38       Impression / Plan:   1. Melena/cirrhosis- agree with transfusions, ppi, octreotide, and rocephin. DDX includes GAVE, esophgeal bleed, pud, other.  Plan for egd asap as clinically feasible. This was discussed with patient and he is agreeable. His memory loss may be related to his elevated Nh4/GIB. 2. Ascites- tense with scrotal edema agree with paracentesis Will need to follow up with GI for more definitive liver care as his initial needs are met. Thank you very much for this consult. These services were provided by Stephens November, NP-C, in collaboration with Andrey Farmer MD, with whom I have discussed this patient in full.   Stephens November, NP-C

## 2020-04-18 NOTE — Anesthesia Postprocedure Evaluation (Signed)
Anesthesia Post Note  Patient: Elijah [name]  Procedure(s) Performed: ESOPHAGOGASTRODUODENOSCOPY (EGD) WITH PROPOFOL (N/A )  Patient location during evaluation: Endoscopy Anesthesia Type: General Level of consciousness: awake and alert and oriented Pain management: pain level controlled Vital Signs Assessment: post-procedure vital signs reviewed and stable Respiratory status: spontaneous breathing Cardiovascular status: blood pressure returned to baseline Anesthetic complications: no   No complications documented.   Last Vitals:  Vitals:   04/18/20 1956 04/18/20 2006  BP: 124/63 135/64  Pulse: 80 81  Resp: 20 18  Temp:    SpO2: 98% 96%    Last Pain:  Vitals:   04/18/20 2006  TempSrc:   PainSc: 0-No pain                 Muzammil Bruins

## 2020-04-18 NOTE — Consult Note (Signed)
Brief cardiology note Vitals Bp 120/70 80 16 afebrile Ekg rate 80 nsstw no ischemic changes Exam benign  Asked to see the patient with elevated troponins Patient probably suffered a non-STEMI May be related to secondary causes like profound anemia Patient denies any significant cardiac symptoms chest pain or angina Patient has significant GI bleed hemoglobin is low at 5 Peak troponin was over 2000 Would recommend echocardiogram to assess left ventricular function wall motion Do not recommend invasive strategy at this point as are options would be limited We will hold aspirin Plavix heparin any antianticoagulant Hopefully the patient be able to tolerate beta-blockers low-dose There is no clear need for nitrates and she is not having chest pain Patient is optimized as much as he can for EGD Recommend proceeding with EGD the moderate to high risk because of recent non-STEMI I do recommend proceeding with EGD to see where he is bleeding from before any further invasive cardiac studies could be of any value. I believe he stable enough to have the EGD and/or colonoscopy performed now I would maintain adequate hydration transfused to a hemoglobin of 8 or 9 Supplemental oxygen therapy as necessary Full cardiac note to follow Thanks [facility]

## 2020-04-18 NOTE — ED Notes (Signed)
Report given to Endoscopy RN. Endoscopy is ready for patient, but patient is in IR for a paracentesis. Endoscopy RN aware.

## 2020-04-18 NOTE — Discharge Summary (Signed)
Physician Discharge Summary  Elijah [name]:[id number] DOB: 1933-07-03 DOA: 04/17/2020  PCP: Jaclyn Shaggy, MD  Admit date: 04/17/2020 Discharge date: 04/18/2020  Admitted From: Home Disposition:  [facility]  Discharge Condition: Guarded CODE STATUS: Full Diet recommendation: NPO  Brief/Interim Summary: Elijah [name] is a 87 y.o. male with history of stroke, DM2 and HTN admitted from home with 4 days of melena, weakness, fatigue, chest pain and shortness of breath. He was found to have symptomatic blood loss anemia with initial hemoglobin of 5.7 and NSTEMI with uptrending troponins. He was transfused 3 units PRBCs with appropriate response. He was given Protonix infusion, octreotide and ceftriaxone after CTA abdomen/pelvis demonstrated cirrhotic liver morphology with large gastric varices. GI was consulted for EGD which showed very large gastric varix without any active bleeding, but with evidence of recent bleeding. Small, non-bleeding esophageal varices were also noted. No directed therapy was administered during the EGD. In addition to EGD, diagnostic paracentesis was performed which was not indicative of SBP. Cardiology was consulted and felt that his uptrending troponins and EKG abnormalities were consistent with NSTEMI in the setting of acute blood loss anemia. No specific therapy was recommended other than beta blockade when able to tolerate. The only additional relevant finding is a moderate, free-flowing left-sided pleural effusion which has not been previously sampled; non-urgent thoracentesis is recommended prior to discharge. Covid PCR is negative.  Gastroenterology felt very strongly that [name] needed evaluation for TIPS or BRTO given high risk for rebleeding given the size and morphology of the gastric varix. Patient accepted by Dr. Chipper Herb of [facility] at [facility]. TTE is pending at this time.  Discharge Diagnoses:  Active Problems:    GIB (gastrointestinal bleeding)   NSTEMI (non-ST elevated myocardial infarction) (HCC)   Blood loss anemia   Cirrhosis of liver (HCC)   Allergies as of 04/18/2020   No Known Allergies     Medication List    TAKE these medications   atorvastatin 40 MG tablet Commonly known as: LIPITOR Take 40 mg by mouth daily.   cefTRIAXone 2 g in sodium chloride 0.9 % 100 mL Inject 2 g into the vein daily.   glimepiride 4 MG tablet Commonly known as: AMARYL Take 4 mg by mouth daily.   metFORMIN 500 MG tablet Commonly known as: GLUCOPHAGE Take 500 mg by mouth 2 (two) times daily with a meal.   octreotide 500 mcg in sodium chloride 0.9 % 250 mL Inject 50 mcg/hr into the vein continuous.   pantoprazole 40 MG injection Commonly known as: PROTONIX Inject 40 mg into the vein every 12 (twelve) hours. Start taking on: April 21, 2020   pioglitazone 30 MG tablet Commonly known as: ACTOS Take 30 mg by mouth daily.   tamsulosin 0.4 MG Caps capsule Commonly known as: FLOMAX Take 0.4 mg by mouth.       No Known Allergies  Consultations:  PCCM, Dr. Arlean Hopping  GI, Dr. Mia Creek  Cardiology, Dr. Juliann Pares  IR, Dr. Bryn Gulling   Procedures/Studies: DG Chest 2 View  Result Date: 04/17/2020 CLINICAL DATA:  Patient with weakness. EXAM: CHEST - 2 VIEW COMPARISON:  Chest radiograph 09/11/2013. FINDINGS: Stable cardiomegaly. Large layering left pleural effusion with underlying consolidation. No pneumothorax. Thoracic spine degenerative changes. IMPRESSION: Large layering left pleural effusion with underlying consolidation. Electronically Signed   By: Annia Belt M.D.   On: 04/17/2020 18:04   US Paracentesis  Result Date: 04/18/2020 INDICATION: Patient with newly found  cirrhosis, ascites. Request is made for diagnostic paracentesis. EXAM: ULTRASOUND GUIDED DIAGNOSTIC PARACENTESIS MEDICATIONS: 10 mL 1% lidocaine COMPLICATIONS: None immediate. PROCEDURE: Informed written consent was obtained from the  patient after a discussion of the risks, benefits and alternatives to treatment. A timeout was performed prior to the initiation of the procedure. Initial ultrasound scanning demonstrates a small amount of ascites within the right lower abdominal quadrant. The right lower abdomen was prepped and draped in the usual sterile fashion. 1% lidocaine was used for local anesthesia. Following this, a 19 gauge, 7-cm, Yueh catheter was introduced. An ultrasound image was saved for documentation purposes. The paracentesis was performed. The catheter was removed and a dressing was applied. The patient tolerated the procedure well without immediate post procedural complication. FINDINGS: A total of approximately 60 mL of pale, clear fluid was removed. Samples were sent to the laboratory as requested by the clinical team. IMPRESSION: Successful ultrasound-guided paracentesis yielding 60 mL of peritoneal fluid. Read by: Loyce Dys PA-C Electronically Signed   By: Acquanetta Belling M.D.   On: 04/18/2020 15:35   CT Angio Abd/Pel W and/or Wo Contrast  Result Date: 04/17/2020 CLINICAL DATA:  GI bleeding. EXAM: CTA ABDOMEN AND PELVIS WITHOUT AND WITH CONTRAST TECHNIQUE: Multidetector CT imaging of the abdomen and pelvis was performed using the standard protocol during bolus administration of intravenous contrast. Multiplanar reconstructed images and MIPs were obtained and reviewed to evaluate the vascular anatomy. CONTRAST:  OMNIPAQUE IOHEXOL 350 MG/ML SOLN COMPARISON:  None. FINDINGS: VASCULAR Aorta: Normal caliber aorta without aneurysm, dissection, vasculitis or significant stenosis. Atherosclerotic changes are noted throughout the abdominal aorta. Celiac: Patent without evidence of aneurysm, dissection, vasculitis or significant stenosis. SMA: Patent without evidence of aneurysm, dissection, vasculitis or significant stenosis. Renals: Both renal arteries are patent without evidence of aneurysm, dissection, vasculitis,  fibromuscular dysplasia or significant stenosis. There are 2 right renal arteries. IMA: Patent without evidence of aneurysm, dissection, vasculitis or significant stenosis. Inflow: Patent without evidence of aneurysm, dissection, vasculitis or significant stenosis. Proximal Outflow: Bilateral common femoral and visualized portions of the superficial and profunda femoral arteries are patent without evidence of aneurysm, dissection, vasculitis or significant stenosis. Veins: No obvious venous abnormality within the limitations of this arterial phase study. Review of the MIP images confirms the above findings. NON-VASCULAR Lower chest: There is a large left-sided pleural effusion that is only partially visualized. There is at least partial collapse of the left lower lobe.The heart size is normal. The intracardiac blood pool is hypodense relative to the adjacent myocardium consistent with anemia. Hepatobiliary: The liver is cirrhotic. There are large gastric varices with a gastro renal shunt. The portal vein is patent. There are esophageal varices. Normal gallbladder.There is no biliary ductal dilation. Pancreas: The pancreas is atrophic. Again noted is a low-attenuation, cystic appearing nodule at the pancreatic tail which is not substantially changed since 2015 is likely a benign process. Spleen: Unremarkable. Adrenals/Urinary Tract: --Adrenal glands: Unremarkable. --Right kidney/ureter: No hydronephrosis or radiopaque kidney stones. --Left kidney/ureter: No hydronephrosis or radiopaque kidney stones. --Urinary bladder: The urinary bladder is significantly distended. Stomach/Bowel: --Stomach/Duodenum: Large gastric varices are noted. --Small bowel: Unremarkable. --Colon: Rectosigmoid diverticulosis without acute inflammation. --Appendix: Normal. Lymphatic: --No retroperitoneal lymphadenopathy. --No mesenteric lymphadenopathy. --No pelvic or inguinal lymphadenopathy. Reproductive: There are large bilateral inguinal  hernias. On the left the inguinal hernia contains a large amount of ascites and portions of the sigmoid colon without evidence for obstruction. On the right, the hernia contains mostly fluid but a small  portion of small bowel as well without evidence for obstruction. Other: There is a moderate to large volume of ascites in the abdomen. Musculoskeletal. Multilevel degenerative changes are noted throughout the lumbar spine. IMPRESSION: 1. No evidence for active arterial GI bleeding. 2. Cirrhosis with stigmata of portal hypertension including a moderate volume ascites and large gastric varices with a gastrorenal shunt. Small esophageal varices are also noted. The portal vein is patent. Splenic vein is patent. 3. Large partially visualized left-sided pleural effusion with collapse of the left lower lobe. 4. Very large bilateral inguinal hernias containing a large volume of ascites and bowel as detailed above. Aortic Atherosclerosis (ICD10-I70.0). Electronically Signed   By: Katherine Mantle M.D.   On: 04/17/2020 18:38    Discharge Exam: Vitals:   04/18/20 1756 04/18/20 1806  BP: 121/72 119/61  Pulse: 72 70  Resp: 15 14  Temp:    SpO2: 97% 97%   Vitals:   04/18/20 1735 04/18/20 1746 04/18/20 1756 04/18/20 1806  BP: 121/71 119/62 121/72 119/61  Pulse: 76 73 72 70  Resp: 19 17 15 14   Temp:      TempSrc:      SpO2: 97% 97% 97% 97%  Weight:      Height:        The results of significant diagnostics from this hospitalization (including imaging, microbiology, ancillary and laboratory) are listed below for reference.     Microbiology: Recent Results (from the past 240 hour(s))  SARS Coronavirus 2 by RT PCR (hospital order, performed in [facility] hospital lab) Nasopharyngeal Nasopharyngeal Swab     Status: None   Collection Time: 04/17/20  5:14 PM   Specimen: Nasopharyngeal Swab  Result Value Ref Range Status   SARS Coronavirus 2 NEGATIVE NEGATIVE Final    Comment: (NOTE) SARS-CoV-2 target  nucleic acids are NOT DETECTED.  The SARS-CoV-2 RNA is generally detectable in upper and lower respiratory specimens during the acute phase of infection. The lowest concentration of SARS-CoV-2 viral copies this assay can detect is 250 copies / mL. A negative result does not preclude SARS-CoV-2 infection and should not be used as the sole basis for treatment or other patient management decisions.  A negative result may occur with improper specimen collection / handling, submission of specimen other than nasopharyngeal swab, presence of viral mutation(s) within the areas targeted by this assay, and inadequate number of viral copies (<250 copies / mL). A negative result must be combined with clinical observations, patient history, and epidemiological information.  Fact Sheet for Patients:   04/19/20  Fact Sheet for Healthcare Providers: BoilerBrush.com.cy  This test is not yet approved or  cleared by the https://pope.com/ FDA and has been authorized for detection and/or diagnosis of SARS-CoV-2 by FDA under an Emergency Use Authorization (EUA).  This EUA will remain in effect (meaning this test can be used) for the duration of the COVID-19 declaration under Section 564(b)(1) of the Act, 21 U.S.C. section 360bbb-3(b)(1), unless the authorization is terminated or revoked sooner.  Performed at [facility], [address] [place], [place]   Culture, blood (routine x 2)     Status: None (Preliminary result)   Collection Time: 04/17/20  6:27 PM   Specimen: BLOOD  Result Value Ref Range Status   Specimen Description BLOOD BLOOD LEFT FOREARM  Final   Special Requests   Final    BOTTLES DRAWN AEROBIC AND ANAEROBIC Blood Culture adequate volume   Culture   Final    NO  GROWTH < 12 HOURS Performed at [facility], [address] [place], [place] [address]    Report Status PENDING  Incomplete  Culture,  blood (routine x 2)     Status: None (Preliminary result)   Collection Time: 04/17/20  6:31 PM   Specimen: BLOOD  Result Value Ref Range Status   Specimen Description BLOOD BLOOD RIGHT WRIST  Final   Special Requests   Final    BOTTLES DRAWN AEROBIC AND ANAEROBIC Blood Culture results may not be optimal due to an inadequate volume of blood received in culture bottles   Culture   Final    NO GROWTH < 12 HOURS Performed at [facility], [address] [place], [place] [address]    Report Status PENDING  Incomplete     Labs: BNP (last 3 results) Recent Labs    04/17/20 1713  BNP 441.0*   Basic Metabolic Panel: Recent Labs  Lab 04/17/20 1419 04/18/20 0515  NA 140 143  K 4.4 4.4  CL 106 111  CO2 13* 19*  GLUCOSE 243* 156*  BUN 56* 64*  CREATININE 1.12 1.11  CALCIUM 8.7* 8.6*  MG  --  1.8  PHOS  --  3.9   Liver Function Tests: Recent Labs  Lab 04/17/20 1419 04/18/20 0515  AST 63* 60*  ALT 33 31  ALKPHOS 110 93  BILITOT 1.1 0.8  PROT 6.5 5.8*  ALBUMIN 2.5* 2.4*   No results for input(s): LIPASE, AMYLASE in the last 168 hours. Recent Labs  Lab 04/18/20 0500  AMMONIA 65*   CBC: Recent Labs  Lab 04/17/20 1419 04/18/20 0100 04/18/20 0615 04/18/20 1436  WBC 11.7*  --  11.2* 10.6*  NEUTROABS  --   --   --  8.9*  HGB 5.7* 6.5* 5.9* 7.2*  HCT 18.0* 19.0* 18.5* 21.4*  MCV 111.8*  --  104.5* 97.3  PLT 191  --  124* 131*   Cardiac Enzymes: No results for input(s): CKTOTAL, CKMB, CKMBINDEX, TROPONINI in the last 168 hours. BNP: Invalid input(s): POCBNP CBG: Recent Labs  Lab 04/18/20 0644 04/18/20 1303  GLUCAP 147* 136*   D-Dimer No results for input(s): DDIMER in the last 72 hours. Hgb A1c No results for input(s): HGBA1C in the last 72 hours. Lipid Profile No results for input(s): CHOL, HDL, LDLCALC, TRIG, CHOLHDL, LDLDIRECT in the last 72 hours. Thyroid function studies No results for input(s): TSH, T4TOTAL, T3FREE, THYROIDAB in the last  72 hours.  Invalid input(s): FREET3 Anemia work up No results for input(s): VITAMINB12, FOLATE, FERRITIN, TIBC, IRON, RETICCTPCT in the last 72 hours. Urinalysis    Component Value Date/Time   COLORURINE YELLOW (A) 04/17/2020 1713   APPEARANCEUR CLEAR (A) 04/17/2020 1713   APPEARANCEUR Hazy 08/28/2013 2112   LABSPEC 1.032 (H) 04/17/2020 1713   LABSPEC 1.018 08/28/2013 2112   PHURINE 5.0 04/17/2020 1713   GLUCOSEU NEGATIVE 04/17/2020 1713   GLUCOSEU 150 mg/dL 66/44/0347 4259   HGBUR SMALL (A) 04/17/2020 1713   BILIRUBINUR NEGATIVE 04/17/2020 1713   BILIRUBINUR Negative 08/28/2013 2112   KETONESUR NEGATIVE 04/17/2020 1713   PROTEINUR NEGATIVE 04/17/2020 1713   NITRITE NEGATIVE 04/17/2020 1713   LEUKOCYTESUR NEGATIVE 04/17/2020 1713   LEUKOCYTESUR Negative 08/28/2013 2112   Sepsis Labs Invalid input(s): PROCALCITONIN,  WBC,  LACTICIDVEN Microbiology Recent Results (from the past 240 hour(s))  SARS Coronavirus 2 by RT PCR (hospital order, performed in [facility] hospital lab) Nasopharyngeal Nasopharyngeal Swab     Status: None  Collection Time: 04/17/20  5:14 PM   Specimen: Nasopharyngeal Swab  Result Value Ref Range Status   SARS Coronavirus 2 NEGATIVE NEGATIVE Final    Comment: (NOTE) SARS-CoV-2 target nucleic acids are NOT DETECTED.  The SARS-CoV-2 RNA is generally detectable in upper and lower respiratory specimens during the acute phase of infection. The lowest concentration of SARS-CoV-2 viral copies this assay can detect is 250 copies / mL. A negative result does not preclude SARS-CoV-2 infection and should not be used as the sole basis for treatment or other patient management decisions.  A negative result may occur with improper specimen collection / handling, submission of specimen other than nasopharyngeal swab, presence of viral mutation(s) within the areas targeted by this assay, and inadequate number of viral copies (<250 copies / mL). A negative result must  be combined with clinical observations, patient history, and epidemiological information.  Fact Sheet for Patients:   BoilerBrush.com.cyhttps://www.fda.gov/media/136312/download  Fact Sheet for Healthcare Providers: https://pope.com/https://www.fda.gov/media/136313/download  This test is not yet approved or  cleared by the [place] FDA and has been authorized for detection and/or diagnosis of SARS-CoV-2 by FDA under an Emergency Use Authorization (EUA).  This EUA will remain in effect (meaning this test can be used) for the duration of the COVID-19 declaration under Section 564(b)(1) of the Act, 21 U.S.C. section 360bbb-3(b)(1), unless the authorization is terminated or revoked sooner.  Performed at [facility], [address] [place], [place] [address]   Culture, blood (routine x 2)     Status: None (Preliminary result)   Collection Time: 04/17/20  6:27 PM   Specimen: BLOOD  Result Value Ref Range Status   Specimen Description BLOOD BLOOD LEFT FOREARM  Final   Special Requests   Final    BOTTLES DRAWN AEROBIC AND ANAEROBIC Blood Culture adequate volume   Culture   Final    NO GROWTH < 12 HOURS Performed at [facility], [address] [place], [place] [address]    Report Status PENDING  Incomplete  Culture, blood (routine x 2)     Status: None (Preliminary result)   Collection Time: 04/17/20  6:31 PM   Specimen: BLOOD  Result Value Ref Range Status   Specimen Description BLOOD BLOOD RIGHT WRIST  Final   Special Requests   Final    BOTTLES DRAWN AEROBIC AND ANAEROBIC Blood Culture results may not be optimal due to an inadequate volume of blood received in culture bottles   Culture   Final    NO GROWTH < 12 HOURS Performed at [facility], [address] [place], [place] [address]    Report Status PENDING  Incomplete     Time coordinating discharge: Over 30 minutes  SIGNED:  Marcelo BaldyAdam Ross Shernell Saldierna, MD 04/18/20 6:47 PM

## 2020-04-18 NOTE — Final Consult Note (Signed)
CARDIOLOGY CONSULT NOTE               [name] MRN: [id number] DOB/AGE: [birth year] 87 y.o.  Admit date: 04/17/2020 Referring Physician Elyn Aquas MD hospitalist Primary Physician none Primary Cardiologist  Reason for Consultation non-STEMI elevated troponin preop for EGD colonoscopy  HPI: Patient is a 87 year old male minimal medical care contact history of diabetes reportedly presented with evidence of GI bleeding hemoglobin was found to be around 5 days no hemoptysis or hematemesis denies any pain during his evaluation he was found to have elevated troponin over thousand but the patient only complains of shortness of breath denies any chest pain or chest heaviness.  Patient denies any previous cardiac history.  Patient states have never been evaluated by cardiology.  Patient now resting comfortably after transfusions feels somewhat better but now is preop for EGD colonoscopy peak troponin so far is 2000 patient denies any chest pain or angina feels reasonably well bleeding appears to have improved but etiology of bleeding is still unknown.  EKG is benign and nondiagnostic equivocal  Review of systems complete and found to be negative unless listed above     Past Medical History:  Diagnosis Date  . Diabetes mellitus without complication [facility])     Past Surgical History:  Procedure Laterality Date  . SHOULDER SURGERY Right     Medications Prior to Admission  Medication Sig Dispense Refill Last Dose  . atorvastatin (LIPITOR) 40 MG tablet Take 40 mg by mouth daily.   04/17/2020 at Unknown time  . glimepiride (AMARYL) 4 MG tablet Take 4 mg by mouth daily.   04/17/2020 at Unknown time  . metFORMIN (GLUCOPHAGE) 500 MG tablet Take 500 mg by mouth 2 (two) times daily with a meal.   04/17/2020 at Unknown time  . pioglitazone (ACTOS) 30 MG tablet Take 30 mg by mouth daily.   04/17/2020 at Unknown time  . tamsulosin (FLOMAX) 0.4 MG CAPS capsule Take 0.4 mg by mouth.    04/17/2020 at Unknown time   Social History   Socioeconomic History  . Marital status: Married    Spouse name: Not on file  . Number of children: Not on file  . Years of education: Not on file  . Highest education level: Not on file  Occupational History  . Not on file  Tobacco Use  . Smoking status: Never Smoker  . Smokeless tobacco: Never Used  Vaping Use  . Vaping Use: Never used  Substance and Sexual Activity  . Alcohol use: Never  . Drug use: Never  . Sexual activity: Not on file  Other Topics Concern  . Not on file  Social History Narrative  . Not on file   Social Determinants of Health   Financial Resource Strain: Not on file  Food Insecurity: Not on file  Transportation Needs: Not on file  Physical Activity: Not on file  Stress: Not on file  Social Connections: Not on file  Intimate Partner Violence: Not on file    History reviewed. No pertinent family history.    Review of systems complete and found to be negative unless listed above      PHYSICAL EXAM  General: Well developed, well nourished, in no acute distress HEENT:  Normocephalic and atramatic Neck:  No JVD.  Lungs: Clear bilaterally to auscultation and percussion. Heart: HRRR . Normal S1 and S2 without gallops or murmurs.  Abdomen: Bowel sounds are positive, abdomen soft and non-tender  Msk:  Back normal,  normal gait. Normal strength and tone for age. Extremities: No clubbing, cyanosis or edema.   Neuro: Alert and oriented X 3. Psych:  Good affect, responds appropriately  Labs:   Lab Results  Component Value Date   WBC 10.6 (H) 04/18/2020   HGB 7.2 (L) 04/18/2020   HCT 21.4 (L) 04/18/2020   MCV 97.3 04/18/2020   PLT 131 (L) 04/18/2020    Recent Labs  Lab 04/18/20 0515  NA 143  K 4.4  CL 111  CO2 19*  BUN 64*  CREATININE 1.11  CALCIUM 8.6*  PROT 5.8*  BILITOT 0.8  ALKPHOS 93  ALT 31  AST 60*  GLUCOSE 156*   Lab Results  Component Value Date   CKMB 1.2 08/28/2013    TROPONINI 0.17 (H) 09/01/2013    Lab Results  Component Value Date   CHOL 89 08/28/2013   Lab Results  Component Value Date   HDL 46 08/28/2013   Lab Results  Component Value Date   LDLCALC 24 08/28/2013   Lab Results  Component Value Date   TRIG 96 08/28/2013   No results found for: CHOLHDL No results found for: LDLDIRECT    Radiology: DG Chest 2 View  Result Date: 04/17/2020 CLINICAL DATA:  Patient with weakness. EXAM: CHEST - 2 VIEW COMPARISON:  Chest radiograph 09/11/2013. FINDINGS: Stable cardiomegaly. Large layering left pleural effusion with underlying consolidation. No pneumothorax. Thoracic spine degenerative changes. IMPRESSION: Large layering left pleural effusion with underlying consolidation. Electronically Signed   By: Annia Belt M.D.   On: 04/17/2020 18:04   US Paracentesis  Result Date: 04/18/2020 INDICATION: Patient with newly found cirrhosis, ascites. Request is made for diagnostic paracentesis. EXAM: ULTRASOUND GUIDED DIAGNOSTIC PARACENTESIS MEDICATIONS: 10 mL 1% lidocaine COMPLICATIONS: None immediate. PROCEDURE: Informed written consent was obtained from the patient after a discussion of the risks, benefits and alternatives to treatment. A timeout was performed prior to the initiation of the procedure. Initial ultrasound scanning demonstrates a small amount of ascites within the right lower abdominal quadrant. The right lower abdomen was prepped and draped in the usual sterile fashion. 1% lidocaine was used for local anesthesia. Following this, a 19 gauge, 7-cm, Yueh catheter was introduced. An ultrasound image was saved for documentation purposes. The paracentesis was performed. The catheter was removed and a dressing was applied. The patient tolerated the procedure well without immediate post procedural complication. FINDINGS: A total of approximately 60 mL of pale, clear fluid was removed. Samples were sent to the laboratory as requested by the clinical team.  IMPRESSION: Successful ultrasound-guided paracentesis yielding 60 mL of peritoneal fluid. Read by: Loyce Dys PA-C Electronically Signed   By: Acquanetta Belling M.D.   On: 04/18/2020 15:35   CT Angio Abd/Pel W and/or Wo Contrast  Result Date: 04/17/2020 CLINICAL DATA:  GI bleeding. EXAM: CTA ABDOMEN AND PELVIS WITHOUT AND WITH CONTRAST TECHNIQUE: Multidetector CT imaging of the abdomen and pelvis was performed using the standard protocol during bolus administration of intravenous contrast. Multiplanar reconstructed images and MIPs were obtained and reviewed to evaluate the vascular anatomy. CONTRAST:  OMNIPAQUE IOHEXOL 350 MG/ML SOLN COMPARISON:  None. FINDINGS: VASCULAR Aorta: Normal caliber aorta without aneurysm, dissection, vasculitis or significant stenosis. Atherosclerotic changes are noted throughout the abdominal aorta. Celiac: Patent without evidence of aneurysm, dissection, vasculitis or significant stenosis. SMA: Patent without evidence of aneurysm, dissection, vasculitis or significant stenosis. Renals: Both renal arteries are patent without evidence of aneurysm, dissection, vasculitis, fibromuscular dysplasia or significant stenosis. There are  2 right renal arteries. IMA: Patent without evidence of aneurysm, dissection, vasculitis or significant stenosis. Inflow: Patent without evidence of aneurysm, dissection, vasculitis or significant stenosis. Proximal Outflow: Bilateral common femoral and visualized portions of the superficial and profunda femoral arteries are patent without evidence of aneurysm, dissection, vasculitis or significant stenosis. Veins: No obvious venous abnormality within the limitations of this arterial phase study. Review of the MIP images confirms the above findings. NON-VASCULAR Lower chest: There is a large left-sided pleural effusion that is only partially visualized. There is at least partial collapse of the left lower lobe.The heart size is normal. The intracardiac  blood pool is hypodense relative to the adjacent myocardium consistent with anemia. Hepatobiliary: The liver is cirrhotic. There are large gastric varices with a gastro renal shunt. The portal vein is patent. There are esophageal varices. Normal gallbladder.There is no biliary ductal dilation. Pancreas: The pancreas is atrophic. Again noted is a low-attenuation, cystic appearing nodule at the pancreatic tail which is not substantially changed since 2015 is likely a benign process. Spleen: Unremarkable. Adrenals/Urinary Tract: --Adrenal glands: Unremarkable. --Right kidney/ureter: No hydronephrosis or radiopaque kidney stones. --Left kidney/ureter: No hydronephrosis or radiopaque kidney stones. --Urinary bladder: The urinary bladder is significantly distended. Stomach/Bowel: --Stomach/Duodenum: Large gastric varices are noted. --Small bowel: Unremarkable. --Colon: Rectosigmoid diverticulosis without acute inflammation. --Appendix: Normal. Lymphatic: --No retroperitoneal lymphadenopathy. --No mesenteric lymphadenopathy. --No pelvic or inguinal lymphadenopathy. Reproductive: There are large bilateral inguinal hernias. On the left the inguinal hernia contains a large amount of ascites and portions of the sigmoid colon without evidence for obstruction. On the right, the hernia contains mostly fluid but a small portion of small bowel as well without evidence for obstruction. Other: There is a moderate to large volume of ascites in the abdomen. Musculoskeletal. Multilevel degenerative changes are noted throughout the lumbar spine. IMPRESSION: 1. No evidence for active arterial GI bleeding. 2. Cirrhosis with stigmata of portal hypertension including a moderate volume ascites and large gastric varices with a gastrorenal shunt. Small esophageal varices are also noted. The portal vein is patent. Splenic vein is patent. 3. Large partially visualized left-sided pleural effusion with collapse of the left lower lobe. 4. Very large  bilateral inguinal hernias containing a large volume of ascites and bowel as detailed above. Aortic Atherosclerosis (ICD10-I70.0). Electronically Signed   By: Katherine Mantle M.D.   On: 04/17/2020 18:38    EKG: Normal sinus rhythm nonspecific ST-T wave changes no evidence of ischemia  ASSESSMENT AND PLAN:  Non-STEMI Elevated troponins up to 2000 GI bleeding Hypotension Pleural effusion Metabolic acidosis Coagulopathy secondary to Eliquis Diabetes COPD Hyperlipidemia . Plan Agree admit follow-up troponins EKGs Maintain adequate hydration Transfuse to hemoglobin of 9 or 10 Avoid any anticoagulants including aspirin Plavix heparin and especially Eliquis If blood pressure tolerates low-dose beta-blockade may be helpful Nitrates only the patient is symptomatic with chest pain Supplemental oxygen as necessary for shortness of breath Inhalers for COPD Continue diabetes management and control Echocardiogram for assessment of left ventricular function and valvular structures Do not recommend any invasive cardiac procedures at this point because therapeutic options would be very limited If the patient stabilizes would recommend proceeding with EGD colonoscopy as a moderate to high risk patient Agree with lipid management with Lipitor Consider diagnostic and therapeutic thoracentesis for pleural effusion I do not recommend any further interventions to modify his risk at this stage  Signed: Alwyn Pea MD 04/18/2020, 4:20 PM

## 2020-04-18 NOTE — ED Notes (Signed)
Per telephone Hinda Glatter RN Endoscopy RN, patient was moved from IR ultrasound to Endoscopy. Patient's daughter was given the two bags of patient's belongings which included his jeans and boots, lower dentures and glasses. Patient's daughter was directed to move her car to [facility] and go to the 2nd floor waiting room for surgical patients.

## 2020-04-18 NOTE — Procedures (Signed)
PROCEDURE SUMMARY:  Patient admitted with melena, anemia.  CT shows new cirrhosis, large gastric varices, small esophageal varices, and ascites.  Reviewed imaging with Dr. Bryn Gulling.  There is a small amount of perihepatic ascites, however largest ascites burden is via inguinal hernias which may not be drainable without risk of incarceration.  Patient also has a large left pleural effusion.  Discussed with Dr. Arlean Hopping, proceed with diagnostic paracentesis to rule of SBP.  Patient s/p blood transfusions with improvement in Hgb to 7.2 Platelets 131.  Successful US guided paracentesis from right lateral abdomen.  Yielded 60 mL of pale, clear fluid.  No immediate complications.  Pt tolerated well.   Specimen was sent for labs.  EBL < 99mL  Hoyt Koch PA-C 04/18/2020 3:25 PM

## 2020-04-18 NOTE — ED Notes (Addendum)
Patient taken to ultrasound.IR

## 2020-04-18 NOTE — ED Notes (Signed)
Pt resting quietly and comfortably at this time. Pt denies any needs at this time. NAD noted. Call bell in reach.

## 2020-04-18 NOTE — H&P (Addendum)
History and Physical  Elijah [name] SWH:[id number] DOB: 1933/08/23 DOA: 04/18/2020  Referring physician: Accepted by Dr. Chipper Herb, [facility] PCP: Jaclyn Shaggy, MD  Outpatient Specialists: None Patient coming from: Home through [facility] ICU  Chief Complaint: Generalized weakness, fatigue, dark stools, chest pain, and shortness of breath.  HPI: Elijah [name] is a 87 y.o. male with medical history significant for CVA in 2015, MSSA bacteremia, essential hypertension, hyperlipidemia, type 2 diabetes who initially presented to [facility] from home on 04/17/2020 due to 4 days of melena, weakness, fatigue, chest pain, and shortness of breath.  Work-up revealed symptomatic blood loss anemia with initial hemoglobin of 5.7K and NSTEMI.  Admitted to the ICU.  He received 3 units PRBCs.  Currently on Protonix drip, received octreotide and Rocephin after CT angio abdomen and pelvis showed cirrhotic liver morphology with large gastric varices.  Seen by GI at [facility], had EGD done on 04/18/2020 which showed very large gastric varix without any active bleeding but with evidence of recent bleeding.  Small nonbleeding superficial varices were also noted.  He also had diagnostic paracentesis, no evidence of SBP.  Seen by cardiology felt that his uptrending troponins and EKG abnormalities were consistent with NSTEMI in the setting of acute blood loss anemia.  Recommended beta-blockade when able to tolerate.  GI recommended evaluation for TIPS or BRTO given high risk for rebleeding from the morphology of the size of the gastric varix and to transfer to [facility] for IR guided TIPS.   ED Course: Direct admit from [facility].  Physical exam showed moderate abdominal distention.  Denies abdominal pain or nausea.  Denies any anginal symptoms at the time of this visit..  Review of Systems: Review of systems as noted in the HPI. All other systems reviewed and are negative.   Past Medical History:  Diagnosis Date  . Diabetes mellitus without  complication [facility])    Past Surgical History:  Procedure Laterality Date  . SHOULDER SURGERY Right     Social History:  reports that he has never smoked. He has never used smokeless tobacco. He reports that he does not drink alcohol and does not use drugs.   No Known Allergies  Family history: Hypertension   Prior to Admission medications   Medication Sig Start Date End Date Taking? Authorizing Provider  atorvastatin (LIPITOR) 40 MG tablet Take 40 mg by mouth daily. 07/30/13   [provider]  cefTRIAXone 2 g in sodium chloride 0.9 % 100 mL Inject 2 g into the vein daily. 04/18/20   Schertz, Shari Heritage, MD  glimepiride (AMARYL) 4 MG tablet Take 4 mg by mouth daily. 08/17/13   [provider]  metFORMIN (GLUCOPHAGE) 500 MG tablet Take 500 mg by mouth 2 (two) times daily with a meal. 08/08/13   [provider]  octreotide 500 mcg in sodium chloride 0.9 % 250 mL Inject 50 mcg/hr into the vein continuous. 04/18/20   Schertz, Shari Heritage, MD  pantoprazole (PROTONIX) 40 MG injection Inject 40 mg into the vein every 12 (twelve) hours. 04/21/20   Schertz, Shari Heritage, MD  pioglitazone (ACTOS) 30 MG tablet Take 30 mg by mouth daily. 07/30/13   [provider]  tamsulosin (FLOMAX) 0.4 MG CAPS capsule Take 0.4 mg by mouth.    [provider]  insulin detemir (LEVEMIR) 100 UNIT/ML injection Inject 20 Units into the skin at bedtime.  11/11/18  [provider]  metoprolol tartrate (LOPRESSOR) 25 MG tablet Take 25 mg by mouth 3 (three)  times daily.   11/11/18  [provider]    Physical Exam: BP 139/63 (BP Location: Left Arm)   Pulse 85   Temp 97.7 F (36.5 C)   Resp 17   SpO2 99%   . General: 87 y.o. year-old male well developed well nourished in no acute distress.  Alert and oriented x3. . Cardiovascular: Regular rate and rhythm with no rubs or gallops.  No thyromegaly or JVD noted.  No lower extremity edema. 2/4 pulses in all 4  extremities. Marland Kitchen Respiratory: Clear to auscultation with no wheezes or rales. Good inspiratory effort. . Abdomen: Moderately distended, nontender, with normal bowel sounds x4 quadrants. . Muskuloskeletal: No cyanosis or clubbing.  Trace edema noted bilaterally . Neuro: CN II-XII intact, strength, sensation, reflexes . Skin: No ulcerative lesions noted or rashes . Psychiatry: Judgement and insight appear normal. Mood is appropriate for condition and setting          Labs on Admission:  Basic Metabolic Panel: Recent Labs  Lab 04/17/20 1419 04/18/20 0515  NA 140 143  K 4.4 4.4  CL 106 111  CO2 13* 19*  GLUCOSE 243* 156*  BUN 56* 64*  CREATININE 1.12 1.11  CALCIUM 8.7* 8.6*  MG  --  1.8  PHOS  --  3.9   Liver Function Tests: Recent Labs  Lab 04/17/20 1419 04/18/20 0515  AST 63* 60*  ALT 33 31  ALKPHOS 110 93  BILITOT 1.1 0.8  PROT 6.5 5.8*  ALBUMIN 2.5* 2.4*   No results for input(s): LIPASE, AMYLASE in the last 168 hours. Recent Labs  Lab 04/18/20 0500  AMMONIA 65*   CBC: Recent Labs  Lab 04/17/20 1419 04/18/20 0100 04/18/20 0615 04/18/20 1436 04/18/20 1951  WBC 11.7*  --  11.2* 10.6*  --   NEUTROABS  --   --   --  8.9*  --   HGB 5.7* 6.5* 5.9* 7.2* 8.4*  HCT 18.0* 19.0* 18.5* 21.4* 24.9*  MCV 111.8*  --  104.5* 97.3  --   PLT 191  --  124* 131*  --    Cardiac Enzymes: No results for input(s): CKTOTAL, CKMB, CKMBINDEX, TROPONINI in the last 168 hours.  BNP (last 3 results) Recent Labs    04/17/20 1713  BNP 441.0*    ProBNP (last 3 results) No results for input(s): PROBNP in the last 8760 hours.  CBG: Recent Labs  Lab 04/18/20 0644 04/18/20 1303  GLUCAP 147* 136*    Radiological Exams on Admission: DG Chest 2 View  Result Date: 04/17/2020 CLINICAL DATA:  Patient with weakness. EXAM: CHEST - 2 VIEW COMPARISON:  Chest radiograph 09/11/2013. FINDINGS: Stable cardiomegaly. Large layering left pleural effusion with underlying consolidation. No  pneumothorax. Thoracic spine degenerative changes. IMPRESSION: Large layering left pleural effusion with underlying consolidation. Electronically Signed   By: Annia Belt M.D.   On: 04/17/2020 18:04   US Paracentesis  Result Date: 04/18/2020 INDICATION: Patient with newly found cirrhosis, ascites. Request is made for diagnostic paracentesis. EXAM: ULTRASOUND GUIDED DIAGNOSTIC PARACENTESIS MEDICATIONS: 10 mL 1% lidocaine COMPLICATIONS: None immediate. PROCEDURE: Informed written consent was obtained from the patient after a discussion of the risks, benefits and alternatives to treatment. A timeout was performed prior to the initiation of the procedure. Initial ultrasound scanning demonstrates a small amount of ascites within the right lower abdominal quadrant. The right lower abdomen was prepped and draped in the usual sterile fashion. 1% lidocaine was used for local anesthesia. Following this, a 19 gauge,  7-cm, Yueh catheter was introduced. An ultrasound image was saved for documentation purposes. The paracentesis was performed. The catheter was removed and a dressing was applied. The patient tolerated the procedure well without immediate post procedural complication. FINDINGS: A total of approximately 60 mL of pale, clear fluid was removed. Samples were sent to the laboratory as requested by the clinical team. IMPRESSION: Successful ultrasound-guided paracentesis yielding 60 mL of peritoneal fluid. Read by: Loyce Dys PA-C Electronically Signed   By: Acquanetta Belling M.D.   On: 04/18/2020 15:35   CT Angio Abd/Pel W and/or Wo Contrast  Result Date: 04/17/2020 CLINICAL DATA:  GI bleeding. EXAM: CTA ABDOMEN AND PELVIS WITHOUT AND WITH CONTRAST TECHNIQUE: Multidetector CT imaging of the abdomen and pelvis was performed using the standard protocol during bolus administration of intravenous contrast. Multiplanar reconstructed images and MIPs were obtained and reviewed to evaluate the vascular anatomy. CONTRAST:   OMNIPAQUE IOHEXOL 350 MG/ML SOLN COMPARISON:  None. FINDINGS: VASCULAR Aorta: Normal caliber aorta without aneurysm, dissection, vasculitis or significant stenosis. Atherosclerotic changes are noted throughout the abdominal aorta. Celiac: Patent without evidence of aneurysm, dissection, vasculitis or significant stenosis. SMA: Patent without evidence of aneurysm, dissection, vasculitis or significant stenosis. Renals: Both renal arteries are patent without evidence of aneurysm, dissection, vasculitis, fibromuscular dysplasia or significant stenosis. There are 2 right renal arteries. IMA: Patent without evidence of aneurysm, dissection, vasculitis or significant stenosis. Inflow: Patent without evidence of aneurysm, dissection, vasculitis or significant stenosis. Proximal Outflow: Bilateral common femoral and visualized portions of the superficial and profunda femoral arteries are patent without evidence of aneurysm, dissection, vasculitis or significant stenosis. Veins: No obvious venous abnormality within the limitations of this arterial phase study. Review of the MIP images confirms the above findings. NON-VASCULAR Lower chest: There is a large left-sided pleural effusion that is only partially visualized. There is at least partial collapse of the left lower lobe.The heart size is normal. The intracardiac blood pool is hypodense relative to the adjacent myocardium consistent with anemia. Hepatobiliary: The liver is cirrhotic. There are large gastric varices with a gastro renal shunt. The portal vein is patent. There are esophageal varices. Normal gallbladder.There is no biliary ductal dilation. Pancreas: The pancreas is atrophic. Again noted is a low-attenuation, cystic appearing nodule at the pancreatic tail which is not substantially changed since 2015 is likely a benign process. Spleen: Unremarkable. Adrenals/Urinary Tract: --Adrenal glands: Unremarkable. --Right kidney/ureter: No hydronephrosis or  radiopaque kidney stones. --Left kidney/ureter: No hydronephrosis or radiopaque kidney stones. --Urinary bladder: The urinary bladder is significantly distended. Stomach/Bowel: --Stomach/Duodenum: Large gastric varices are noted. --Small bowel: Unremarkable. --Colon: Rectosigmoid diverticulosis without acute inflammation. --Appendix: Normal. Lymphatic: --No retroperitoneal lymphadenopathy. --No mesenteric lymphadenopathy. --No pelvic or inguinal lymphadenopathy. Reproductive: There are large bilateral inguinal hernias. On the left the inguinal hernia contains a large amount of ascites and portions of the sigmoid colon without evidence for obstruction. On the right, the hernia contains mostly fluid but a small portion of small bowel as well without evidence for obstruction. Other: There is a moderate to large volume of ascites in the abdomen. Musculoskeletal. Multilevel degenerative changes are noted throughout the lumbar spine. IMPRESSION: 1. No evidence for active arterial GI bleeding. 2. Cirrhosis with stigmata of portal hypertension including a moderate volume ascites and large gastric varices with a gastrorenal shunt. Small esophageal varices are also noted. The portal vein is patent. Splenic vein is patent. 3. Large partially visualized left-sided pleural effusion with collapse of the left lower lobe. 4. Very  large bilateral inguinal hernias containing a large volume of ascites and bowel as detailed above. Aortic Atherosclerosis (ICD10-I70.0). Electronically Signed   By: Katherine Mantle M.D.   On: 04/17/2020 18:38    EKG: I independently viewed the EKG done and my findings are as followed: Sinus rhythm, first-degree AV block.  Assessment/Plan Present on Admission: **None**  Active Problems:   * No active hospital problems. *  Acute blood loss anemia secondary to upper GI bleed post EGD on 04/18/2020. Presented initially to [facility] ED with complaints of melena 4 days, with hemoglobin of 5.7 on  presentation.  Admitted to ICU. Transfused 3 unit PRBCs, hemoglobin 8.6K Continue to trend H&H, transfuse with hemoglobin less than 7. EGD done on 04/18/2020 showed very large gastric varix without any active bleeding but with evidence of recent bleeding.  Small nonbleeding superficial varices were also noted.  GI at [facility] recommended transfer to [facility] for IR guided TIPS Continue Protonix drip and octreotide drip. Consult GI in the morning.  Newly diagnosed cirrhosis of the liver with large gastric varices Seen by GI at [facility], transferred to [facility] for possible IR guided TIPS IR has been consulted Consult GI in the morning Continue Protonix drip, octreotide drip, Rocephin.  NSTEMI in the setting of marked acute blood loss anemia. Presented with hemoglobin of 5.7K, baseline hemoglobin 10. Troponin has peaked at greater than 3700, downtrending to 3517. Denies any anginal symptoms. Continue to monitor on telemetry. Consult cardiology in the morning  Newly diagnosed large ascites from cirrhosis Will likely require paracentesis during this admission Consult IR when indicated  Type 2 diabetes with hyperglycemia Presented with elevated serum glucose 156 Obtain hemoglobin A1c Hold off home oral antiglycemic's Start insulin sliding scale and avoid hypoglycemia.  Isolated elevated AST Continue to monitor Avoid hepatotoxic agents Hold off on statin for now  BPH Resume home Flomax Monitor urine output  Physical debility PT to assess Fall precautions.   DVT prophylaxis: SCDs.  Code Status: DNR stated by the patient himself.  Family Communication: None at bedside.  Disposition Plan: Admitted to progressive unit.  Consults called: None, consult GI and cardiology in the morning.  Admission status: Inpatient status.  Patient will require at least 2 midnights for further evaluation and treatment of present condition.   Status is: Inpatient   Dispo: The patient is  from: Home.              Anticipated d/c is to: Home.              Anticipated d/c date is: 04/21/2020.               Patient currently not stable for discharge due to ongoing management of newly diagnosed cirrhosis and ascites, upper GI bleed.          Darlin Drop MD [facility] Pager [phone]  If 7PM-7AM, please contact night-coverage [email or web address] Password [facility]  04/18/2020, 11:26 PM

## 2020-04-18 NOTE — Progress Notes (Signed)
No GI bleed Hx came with anemia. CTA abd showed cirrhosis new onset with gastric varices. EGD today showed no active bleeding but severe gastric varices concerning about impending bleed. GI recommend pt come to [facility] for IR guided TIPS.  On Octreotide drip and BID PPI  Hb improved to 7.2 and BP HR stable.  Elevated Trop 1400>1800, cardio felt it is demanding ischemia from acute blood loss.  Agreed to come to PCU, IR consult in.

## 2020-04-18 NOTE — Op Note (Signed)
[facility] Patient Name: Elijah [name] Procedure Date: 04/18/2020 3:39 PM MRN: [id number] Account #: [id number] Date of Birth: 1933-09-03 Admit Type: Inpatient Age: 87 Room: [facility] ENDO ROOM 4 Gender: Male Note Status: Finalized Procedure:             Upper GI endoscopy Indications:           Melena Providers:             Andrey Farmer MD, MD Referring MD:          No Local Md, MD (Referring MD) Medicines:             Monitored Anesthesia Care Complications:         No immediate complications. Procedure:             Pre-Anesthesia Assessment:                        - Prior to the procedure, a History and Physical was                         performed, and patient medications and allergies were                         reviewed. The patient is competent. The risks and                         benefits of the procedure and the sedation options and                         risks were discussed with the patient. All questions                         were answered and informed consent was obtained.                         Patient identification and proposed procedure were                         verified by the physician, the nurse, the anesthetist                         and the technician in the endoscopy suite. Mental                         Status Examination: alert and oriented. Airway                         Examination: normal oropharyngeal airway and neck                         mobility. Respiratory Examination: clear to                         auscultation. CV Examination: normal. Prophylactic                         Antibiotics: The patient does not require prophylactic  antibiotics. Prior Anticoagulants: The patient has                         taken Eliquis (apixaban), last dose was 3 weeks prior                         to procedure. ASA Grade Assessment: IV - A patient                         with severe systemic  disease that is a constant threat                         to life. After reviewing the risks and benefits, the                         patient was deemed in satisfactory condition to                         undergo the procedure. The anesthesia plan was to use                         monitored anesthesia care (MAC). Immediately prior to                         administration of medications, the patient was                         re-assessed for adequacy to receive sedatives. The                         heart rate, respiratory rate, oxygen saturations,                         blood pressure, adequacy of pulmonary ventilation, and                         response to care were monitored throughout the                         procedure. The physical status of the patient was                         re-assessed after the procedure.                        After obtaining informed consent, the endoscope was                         passed under direct vision. Throughout the procedure,                         the patient's blood pressure, pulse, and oxygen                         saturations were monitored continuously. The Endoscope                         was introduced through the mouth, and advanced  to the                         second part of duodenum. The upper GI endoscopy was                         accomplished without difficulty. The patient tolerated                         the procedure well. Findings:      Grade I varices were found in the lower third of the esophagus.      Type 2 gastroesophageal varices (GOV2, esophageal varices which extend       along the fundus) with no bleeding were found in the gastric fundus.       There were stigmata of recent bleeding. They were large in largest       diameter.      Red blood was found in the duodenal bulb, in the first portion of the       duodenum and in the second portion of the duodenum.      The exam of the duodenum was otherwise  normal. Impression:            - Grade I esophageal varices.                        - Type 2 gastroesophageal varices (GOV2, esophageal                         varices which extend along the fundus), without                         bleeding.                        - Blood in the duodenal bulb and in the second portion                         of the duodenum.                        - No specimens collected. Recommendation:        - Return patient to hospital ward for ongoing care.                        - NPO.                        - Administer an IV bolus of 50 micrograms of                         octreotide followed by an infusion of 50 micrograms                         per hour.                        - If patient and family agreeable then would recommend                         transfer to facility with IR capability for  potential                         TIPS                        - Continue IV antibiotics with ceftriaxone daily                        - Maintain good IV access, active type and screen, and                         transfuse if hemoglobin < 7. Please try to avoid over                         transfusing in order to avoid rebound portal                         hypertension. Procedure Code(s):     --- Professional ---                        [phone], Esophagogastroduodenoscopy, flexible,                         transoral; diagnostic, including collection of                         specimen(s) by brushing or washing, when performed                         (separate procedure) Diagnosis Code(s):     --- Professional ---                        I85.00, Esophageal varices without bleeding                        I86.4, Gastric varices                        K92.2, Gastrointestinal hemorrhage, unspecified                        K92.1, Melena (includes Hematochezia) CPT copyright 2019 American Medical Association. All rights reserved. The codes documented in this report are  preliminary and upon coder review may  be revised to meet current compliance requirements. Andrey Farmer MD, MD 04/18/2020 4:29:56 PM Number of Addenda: 0 Note Initiated On: 04/18/2020 3:39 PM Estimated Blood Loss:  Estimated blood loss: none.      [facility]

## 2020-04-18 NOTE — Transfer of Care (Signed)
Immediate Anesthesia Transfer of Care Note  Patient: Elijah [name]  Procedure(s) Performed: ESOPHAGOGASTRODUODENOSCOPY (EGD) WITH PROPOFOL (N/A )  Patient Location: PACU  Anesthesia Type:General  Level of Consciousness: sedated and drowsy  Airway & Oxygen Therapy: Patient Spontanous Breathing  Post-op Assessment: Report given to RN and Post -op Vital signs reviewed and stable  Post vital signs: Reviewed and stable  Last Vitals:  Vitals Value Taken Time  BP 110/61 04/18/20 1616  Temp    Pulse 79 04/18/20 1616  Resp 20 04/18/20 1616  SpO2 100 % 04/18/20 1616    Last Pain:  Vitals:   04/18/20 1616  TempSrc:   PainSc: 0-No pain         Complications: No complications documented.

## 2020-04-18 NOTE — Care Plan (Signed)
EGD performed showing large GOV-2 gastric varix with stigmata of recent bleeding. Given this I would recommend transfer to outside facility with IR capability of TIPS vs BRTO. Continue octreotide drip and antibiotics.  Merlyn Lot MD, MPH [facility]

## 2020-04-18 NOTE — Progress Notes (Signed)
Pt. Transferring from [facility] to [facility] room [phone] via [facility]. Report called to Reden RN. Notified son Gery Pray of transfer to [facility] for TIPS procedure.

## 2020-04-18 NOTE — ED Notes (Signed)
Pt had a BM that was dark and tarry. Pt cleaned up and bed changed and new brief and chux put under pt

## 2020-04-19 ENCOUNTER — Inpatient Hospital Stay (HOSPITAL_COMMUNITY): Payer: Medicare Other

## 2020-04-19 DIAGNOSIS — I350 Nonrheumatic aortic (valve) stenosis: Secondary | ICD-10-CM

## 2020-04-19 DIAGNOSIS — R079 Chest pain, unspecified: Secondary | ICD-10-CM

## 2020-04-19 DIAGNOSIS — I351 Nonrheumatic aortic (valve) insufficiency: Secondary | ICD-10-CM

## 2020-04-19 LAB — ECHOCARDIOGRAM COMPLETE
AR max vel: 1.01 cm2
AV Area VTI: 1.17 cm2
AV Area mean vel: 1.08 cm2
AV Mean grad: 16 mmHg
AV Peak grad: 36.8 mmHg
Ao pk vel: 3.03 m/s
Height: 75 in
S' Lateral: 4.1 cm
Weight: 2800.72 oz

## 2020-04-19 LAB — HEPATITIS PANEL, ACUTE
HCV Ab: NONREACTIVE
Hep A IgM: NONREACTIVE
Hep B C IgM: NONREACTIVE
Hepatitis B Surface Ag: NONREACTIVE

## 2020-04-19 LAB — CBC
HCT: 23.5 % — ABNORMAL LOW (ref 39.0–52.0)
Hemoglobin: 8.2 g/dL — ABNORMAL LOW (ref 13.0–17.0)
MCH: 33.6 pg (ref 26.0–34.0)
MCHC: 34.9 g/dL (ref 30.0–36.0)
MCV: 96.3 fL (ref 80.0–100.0)
Platelets: 123 10*3/uL — ABNORMAL LOW (ref 150–400)
RBC: 2.44 MIL/uL — ABNORMAL LOW (ref 4.22–5.81)
RDW: 20.6 % — ABNORMAL HIGH (ref 11.5–15.5)
WBC: 9.2 10*3/uL (ref 4.0–10.5)
nRBC: 0.9 % — ABNORMAL HIGH (ref 0.0–0.2)

## 2020-04-19 LAB — TYPE AND SCREEN
ABO/RH(D): A POS
Antibody Screen: NEGATIVE

## 2020-04-19 LAB — BASIC METABOLIC PANEL
Anion gap: 9 (ref 5–15)
BUN: 53 mg/dL — ABNORMAL HIGH (ref 8–23)
CO2: 22 mmol/L (ref 22–32)
Calcium: 8.3 mg/dL — ABNORMAL LOW (ref 8.9–10.3)
Chloride: 112 mmol/L — ABNORMAL HIGH (ref 98–111)
Creatinine, Ser: 1.21 mg/dL (ref 0.61–1.24)
GFR, Estimated: 59 mL/min — ABNORMAL LOW (ref >60)
Glucose, Bld: 137 mg/dL — ABNORMAL HIGH (ref 70–99)
Potassium: 3.9 mmol/L (ref 3.5–5.1)
Sodium: 143 mmol/L (ref 135–145)

## 2020-04-19 LAB — TROPONIN I (HIGH SENSITIVITY)
Troponin I (High Sensitivity): 3517 ng/L (ref <18)
Troponin I (High Sensitivity): 3145 ng/L (ref <18)

## 2020-04-19 LAB — HEMOGLOBIN A1C
Hgb A1c MFr Bld: 5.4 % (ref 4.8–5.6)
Mean Plasma Glucose: 108.28 mg/dL

## 2020-04-19 LAB — HEMOGLOBIN AND HEMATOCRIT, BLOOD
HCT: 24.4 % — ABNORMAL LOW (ref 39.0–52.0)
Hemoglobin: 8.6 g/dL — ABNORMAL LOW (ref 13.0–17.0)

## 2020-04-19 LAB — LACTIC ACID, PLASMA: Lactic Acid, Venous: 1.4 mmol/L (ref 0.5–1.9)

## 2020-04-19 MED ORDER — SODIUM CHLORIDE 0.9 % IV SOLN
8.0000 mg/h | INTRAVENOUS | Status: DC
  Administered 2020-04-19 (×3): 8 mg/h via INTRAVENOUS
  Filled 2020-04-19 (×5): qty 80

## 2020-04-19 MED ORDER — PANTOPRAZOLE SODIUM 40 MG IV SOLR: 40.0000 mg | Freq: Two times a day (BID) | INTRAVENOUS | Status: DC

## 2020-04-19 MED ORDER — SODIUM CHLORIDE 0.9 % IV SOLN
50.0000 ug/h | INTRAVENOUS | Status: AC
  Administered 2020-04-19 – 2020-04-22 (×7): 50 ug/h via INTRAVENOUS
  Filled 2020-04-19 (×8): qty 1

## 2020-04-19 MED ORDER — TAMSULOSIN HCL 0.4 MG PO CAPS
0.4000 mg | ORAL_CAPSULE | Freq: Every day | ORAL | Status: DC
  Administered 2020-04-19 – 2020-04-27 (×8): 0.4 mg via ORAL
  Filled 2020-04-19 (×8): qty 1

## 2020-04-19 MED ORDER — SODIUM CHLORIDE 0.9 % IV SOLN
2.0000 g | Freq: Every day | INTRAVENOUS | Status: DC
  Administered 2020-04-19 – 2020-04-24 (×7): 2 g via INTRAVENOUS
  Filled 2020-04-19 (×6): qty 2
  Filled 2020-04-19: qty 20
  Filled 2020-04-19: qty 2

## 2020-04-19 NOTE — Progress Notes (Signed)
Patient's both IV sites was leaking and removed. New IVs acces obtained. Pt has difficult IV access and incompatible continuous IV fluids running. IV team advised a PICC line. Provider on call will be notified.We'll continue to monitor.

## 2020-04-19 NOTE — Progress Notes (Signed)
  Echocardiogram 2D Echocardiogram has been performed.  Delcie Roch 04/19/2020, 3:35 PM

## 2020-04-19 NOTE — Consult Note (Signed)
Consult Note for [facility]  Reason for Consult: Bleeding fundic varices Referring Physician: [facility]  Elijah [name] HPI: This is an 87 year old male with a new diagnosis of cirrhosis with GOV2 bleed, CVA, MSSA sepsis, type 2 DM, hyperlipidema, and HTN admitted for further treatment of his GOV2.  The majority of the history was obtained from the chart as his speech is difficult to understand.  He was admitted on 04/17/2020 for melena x 4 days.  He felt weak and there was an increase work of breathing.  His HGB in the ER was at 5.7 g/dL and he was suffering from an NSTEMI as a result of his severe anemia.  Three units of PRBC was administered and he was started on octreotide after a CT angio showed hepatic cirrhosis with large gastric varices.  An EGD was performed on 04/18/2020 and there was a bleeding site noted with his very large GOV2.  He was transferred to [facility] for TIPS and BRTO.  Past Medical History:  Diagnosis Date  . Diabetes mellitus without complication [facility])     Past Surgical History:  Procedure Laterality Date  . SHOULDER SURGERY Right     No family history on file.  Social History:  reports that he has never smoked. He has never used smokeless tobacco. He reports that he does not drink alcohol and does not use drugs.  Allergies: No Known Allergies  Medications:  Scheduled: . [START ON 04/22/2020] pantoprazole  40 mg Intravenous Q12H  . tamsulosin  0.4 mg Oral QPC breakfast   Continuous: . cefTRIAXone (ROCEPHIN)  IV 2 g (04/19/20 0346)  . octreotide  (SANDOSTATIN)    IV infusion 50 mcg/hr (04/19/20 1324)  . pantoprozole (PROTONIX) infusion 8 mg/hr (04/19/20 1322)    Results for orders placed or performed during the hospital encounter of 04/18/20 (from the past 24 hour(s))  Troponin I (High Sensitivity)     Status: Abnormal   Collection Time: 04/19/20 12:55 AM  Result Value Ref Range   Troponin I (High Sensitivity) 3,517 (HH) <18 ng/L  Hemoglobin and  hematocrit, blood     Status: Abnormal   Collection Time: 04/19/20 12:55 AM  Result Value Ref Range   Hemoglobin 8.6 (L) 13.0 - 17.0 g/dL   HCT 29.5 (L) 28.4 - 13.2 %  Troponin I (High Sensitivity)     Status: Abnormal   Collection Time: 04/19/20  2:40 AM  Result Value Ref Range   Troponin I (High Sensitivity) 3,145 (HH) <18 ng/L  CBC     Status: Abnormal   Collection Time: 04/19/20  6:06 AM  Result Value Ref Range   WBC 9.2 4.0 - 10.5 K/uL   RBC 2.44 (L) 4.22 - 5.81 MIL/uL   Hemoglobin 8.2 (L) 13.0 - 17.0 g/dL   HCT 44.0 (L) 10.2 - 72.5 %   MCV 96.3 80.0 - 100.0 fL   MCH 33.6 26.0 - 34.0 pg   MCHC 34.9 30.0 - 36.0 g/dL   RDW 36.6 (H) 44.0 - 34.7 %   Platelets 123 (L) 150 - 400 K/uL   nRBC 0.9 (H) 0.0 - 0.2 %  Basic metabolic panel     Status: Abnormal   Collection Time: 04/19/20  6:06 AM  Result Value Ref Range   Sodium 143 135 - 145 mmol/L   Potassium 3.9 3.5 - 5.1 mmol/L   Chloride 112 (H) 98 - 111 mmol/L   CO2 22 22 - 32 mmol/L   Glucose, Bld 137 (H)  70 - 99 mg/dL   BUN 53 (H) 8 - 23 mg/dL   Creatinine, Ser 8.34 0.61 - 1.24 mg/dL   Calcium 8.3 (L) 8.9 - 10.3 mg/dL   GFR, Estimated 59 (L) >60 mL/min   Anion gap 9 5 - 15  Lactic acid, plasma     Status: None   Collection Time: 04/19/20  6:06 AM  Result Value Ref Range   Lactic Acid, Venous 1.4 0.5 - 1.9 mmol/L  Type and screen [facility]     Status: None   Collection Time: 04/19/20 12:00 PM  Result Value Ref Range   ABO/RH(D) A POS    Antibody Screen NEG    Sample Expiration      04/22/2020,2359 Performed at [facility], [address], [place], [place] [address]      DG Chest 2 View  Result Date: 04/17/2020 CLINICAL DATA:  Patient with weakness. EXAM: CHEST - 2 VIEW COMPARISON:  Chest radiograph 09/11/2013. FINDINGS: Stable cardiomegaly. Large layering left pleural effusion with underlying consolidation. No pneumothorax. Thoracic spine degenerative changes. IMPRESSION: Large layering  left pleural effusion with underlying consolidation. Electronically Signed   By: Annia Belt M.D.   On: 04/17/2020 18:04   US Paracentesis  Result Date: 04/18/2020 INDICATION: Patient with newly found cirrhosis, ascites. Request is made for diagnostic paracentesis. EXAM: ULTRASOUND GUIDED DIAGNOSTIC PARACENTESIS MEDICATIONS: 10 mL 1% lidocaine COMPLICATIONS: None immediate. PROCEDURE: Informed written consent was obtained from the patient after a discussion of the risks, benefits and alternatives to treatment. A timeout was performed prior to the initiation of the procedure. Initial ultrasound scanning demonstrates a small amount of ascites within the right lower abdominal quadrant. The right lower abdomen was prepped and draped in the usual sterile fashion. 1% lidocaine was used for local anesthesia. Following this, a 19 gauge, 7-cm, Yueh catheter was introduced. An ultrasound image was saved for documentation purposes. The paracentesis was performed. The catheter was removed and a dressing was applied. The patient tolerated the procedure well without immediate post procedural complication. FINDINGS: A total of approximately 60 mL of pale, clear fluid was removed. Samples were sent to the laboratory as requested by the clinical team. IMPRESSION: Successful ultrasound-guided paracentesis yielding 60 mL of peritoneal fluid. Read by: Loyce Dys PA-C Electronically Signed   By: Acquanetta Belling M.D.   On: 04/18/2020 15:35   ECHOCARDIOGRAM COMPLETE  Result Date: 04/19/2020    ECHOCARDIOGRAM REPORT   Patient Name:   Elijah [name] Date of Exam: 04/19/2020 Medical Rec #:  [id number]        Height:       75.0 in Accession #:    [id number]       Weight:       175.0 lb Date of Birth:  1933/09/02        BSA:          2.074 m Patient Age:    86 years         BP:           112/58 mmHg Patient Gender: M                HR:           78 bpm. Exam Location:  Inpatient Procedure: 2D Echo Indications:    elevated troponin   History:        Patient has prior history of Echocardiogram examinations, most  recent 09/04/2013. Arrythmias:Atrial Fibrillation.  Sonographer:    Delcie RochLauren Pennington Referring Phys: [id number] Azucena FallenWILLIAM C LANCASTER  Sonographer Comments: Image acquisition challenging due to respiratory motion. IMPRESSIONS  1. Left ventricular ejection fraction, by estimation, is 55 to 60%. The left ventricle has normal function. The left ventricle demonstrates regional wall motion abnormalities (see scoring diagram/findings for description). Left ventricular diastolic parameters are consistent with Grade I diastolic dysfunction (impaired relaxation).  2. Right ventricular systolic function is normal. The right ventricular size is normal.  3. Left atrial size was mildly dilated.  4. Moderate pleural effusion in the left lateral region.  5. The mitral valve is degenerative. Moderate mitral valve regurgitation. No evidence of mitral stenosis.  6. The aortic valve is calcified. There is moderate calcification of the aortic valve. There is moderate thickening of the aortic valve. Aortic valve regurgitation is moderate. Moderate aortic valve stenosis. Aortic valve area, by VTI measures 1.17 cm.  Aortic valve mean gradient measures 16.0 mmHg. Aortic valve Vmax measures 3.03 m/s.  7. The inferior vena cava is normal in size with greater than 50% respiratory variability, suggesting right atrial pressure of 3 mmHg. FINDINGS  Left Ventricle: Left ventricular ejection fraction, by estimation, is 55 to 60%. The left ventricle has normal function. The left ventricle demonstrates regional wall motion abnormalities. The left ventricular internal cavity size was normal in size. There is no left ventricular hypertrophy. Left ventricular diastolic parameters are consistent with Grade I diastolic dysfunction (impaired relaxation).  LV Wall Scoring: The apex is akinetic. Right Ventricle: The right ventricular size is normal. No increase in  right ventricular wall thickness. Right ventricular systolic function is normal. Left Atrium: Left atrial size was mildly dilated. Right Atrium: Right atrial size was normal in size. Pericardium: There is no evidence of pericardial effusion. Mitral Valve: The mitral valve is degenerative in appearance. There is mild thickening of the mitral valve leaflet(s). There is mild calcification of the mitral valve leaflet(s). Moderate mitral valve regurgitation. No evidence of mitral valve stenosis. Tricuspid Valve: The tricuspid valve is normal in structure. Tricuspid valve regurgitation is mild . No evidence of tricuspid stenosis. Aortic Valve: The aortic valve is calcified. There is moderate calcification of the aortic valve. There is moderate thickening of the aortic valve. Aortic valve regurgitation is moderate. Moderate aortic stenosis is present. Aortic valve mean gradient measures 16.0 mmHg. Aortic valve peak gradient measures 36.8 mmHg. Aortic valve area, by VTI measures 1.17 cm. Pulmonic Valve: The pulmonic valve was normal in structure. Pulmonic valve regurgitation is not visualized. No evidence of pulmonic stenosis. Aorta: The aortic root is normal in size and structure. Venous: The inferior vena cava is normal in size with greater than 50% respiratory variability, suggesting right atrial pressure of 3 mmHg. IAS/Shunts: No atrial level shunt detected by color flow Doppler. Additional Comments: There is a moderate pleural effusion in the left lateral region.  LEFT VENTRICLE PLAX 2D LVIDd:         5.20 cm LVIDs:         4.10 cm LV PW:         1.00 cm LV IVS:        1.00 cm LVOT diam:     1.80 cm LV SV:         65 LV SV Index:   31 LVOT Area:     2.54 cm  RIGHT VENTRICLE             IVC RV S prime:  12.50 cm/s  IVC diam: 2.10 cm TAPSE (M-mode): 1.9 cm LEFT ATRIUM             Index       RIGHT ATRIUM           Index LA diam:        4.10 cm 1.98 cm/m  RA Area:     17.30 cm LA Vol (A2C):   74.9 ml 36.12 ml/m RA  Volume:   41.80 ml  20.16 ml/m LA Vol (A4C):   69.0 ml 33.28 ml/m LA Biplane Vol: 72.7 ml 35.06 ml/m  AORTIC VALVE AV Area (Vmax):    1.01 cm AV Area (Vmean):   1.08 cm AV Area (VTI):     1.17 cm AV Vmax:           303.33 cm/s AV Vmean:          184.000 cm/s AV VTI:            0.554 m AV Peak Grad:      36.8 mmHg AV Mean Grad:      16.0 mmHg LVOT Vmax:         120.00 cm/s LVOT Vmean:        77.800 cm/s LVOT VTI:          0.255 m LVOT/AV VTI ratio: 0.46  AORTA Ao Root diam: 3.30 cm  SHUNTS Systemic VTI:  0.25 m Systemic Diam: 1.80 cm Donato Schultz MD Electronically signed by Donato Schultz MD Signature Date/Time: 04/19/2020/4:01:15 PM    Final    CT Angio Abd/Pel W and/or Wo Contrast  Result Date: 04/17/2020 CLINICAL DATA:  GI bleeding. EXAM: CTA ABDOMEN AND PELVIS WITHOUT AND WITH CONTRAST TECHNIQUE: Multidetector CT imaging of the abdomen and pelvis was performed using the standard protocol during bolus administration of intravenous contrast. Multiplanar reconstructed images and MIPs were obtained and reviewed to evaluate the vascular anatomy. CONTRAST:  OMNIPAQUE IOHEXOL 350 MG/ML SOLN COMPARISON:  None. FINDINGS: VASCULAR Aorta: Normal caliber aorta without aneurysm, dissection, vasculitis or significant stenosis. Atherosclerotic changes are noted throughout the abdominal aorta. Celiac: Patent without evidence of aneurysm, dissection, vasculitis or significant stenosis. SMA: Patent without evidence of aneurysm, dissection, vasculitis or significant stenosis. Renals: Both renal arteries are patent without evidence of aneurysm, dissection, vasculitis, fibromuscular dysplasia or significant stenosis. There are 2 right renal arteries. IMA: Patent without evidence of aneurysm, dissection, vasculitis or significant stenosis. Inflow: Patent without evidence of aneurysm, dissection, vasculitis or significant stenosis. Proximal Outflow: Bilateral common femoral and visualized portions of the superficial and  profunda femoral arteries are patent without evidence of aneurysm, dissection, vasculitis or significant stenosis. Veins: No obvious venous abnormality within the limitations of this arterial phase study. Review of the MIP images confirms the above findings. NON-VASCULAR Lower chest: There is a large left-sided pleural effusion that is only partially visualized. There is at least partial collapse of the left lower lobe.The heart size is normal. The intracardiac blood pool is hypodense relative to the adjacent myocardium consistent with anemia. Hepatobiliary: The liver is cirrhotic. There are large gastric varices with a gastro renal shunt. The portal vein is patent. There are esophageal varices. Normal gallbladder.There is no biliary ductal dilation. Pancreas: The pancreas is atrophic. Again noted is a low-attenuation, cystic appearing nodule at the pancreatic tail which is not substantially changed since 2015 is likely a benign process. Spleen: Unremarkable. Adrenals/Urinary Tract: --Adrenal glands: Unremarkable. --Right kidney/ureter: No hydronephrosis or radiopaque kidney stones. --Left kidney/ureter: No hydronephrosis  or radiopaque kidney stones. --Urinary bladder: The urinary bladder is significantly distended. Stomach/Bowel: --Stomach/Duodenum: Large gastric varices are noted. --Small bowel: Unremarkable. --Colon: Rectosigmoid diverticulosis without acute inflammation. --Appendix: Normal. Lymphatic: --No retroperitoneal lymphadenopathy. --No mesenteric lymphadenopathy. --No pelvic or inguinal lymphadenopathy. Reproductive: There are large bilateral inguinal hernias. On the left the inguinal hernia contains a large amount of ascites and portions of the sigmoid colon without evidence for obstruction. On the right, the hernia contains mostly fluid but a small portion of small bowel as well without evidence for obstruction. Other: There is a moderate to large volume of ascites in the abdomen. Musculoskeletal.  Multilevel degenerative changes are noted throughout the lumbar spine. IMPRESSION: 1. No evidence for active arterial GI bleeding. 2. Cirrhosis with stigmata of portal hypertension including a moderate volume ascites and large gastric varices with a gastrorenal shunt. Small esophageal varices are also noted. The portal vein is patent. Splenic vein is patent. 3. Large partially visualized left-sided pleural effusion with collapse of the left lower lobe. 4. Very large bilateral inguinal hernias containing a large volume of ascites and bowel as detailed above. Aortic Atherosclerosis (ICD10-I70.0). Electronically Signed   By: Katherine Mantle M.D.   On: 04/17/2020 18:38    ROS:  As stated above in the HPI otherwise negative.  Blood pressure 124/72, pulse 85, temperature 98.5 F (36.9 C), temperature source Oral, resp. rate 20, SpO2 95 %.    PE: Gen: NAD, Alert and Oriented HEENT:  [place]/AT, EOMI Neck: Supple, no LAD Lungs: CTA Bilaterally CV: RRR without M/G/R ABD: Soft, NTND, +BS Ext: No C/C/E  Assessment/Plan: 1) Very large GOV2. 2) Cirrhosis. 3) NSTEMI.   The patient is current stable, but he is at high risk for rebleed.  Review of his blood work over the years shows that his HGB ranged int he 7-8 g/dL range.  He was also noted to have a significant macrocytosis and this may be from ETOH as he also has chronic calcific pancreatitis by the CT scan.  In 2015 he was tested for viral hepatidities, which were negative.  At that time his AP was >1000 and the source was an MSSA sepsis.  Treatment with Unasyn resolved this issue.  Imaging of his liver in 2015 did not show any overt signs of cirrhosis.  After review of all the records he does need further management with IR for TIPS and BRTO.  Plan: 1) Consult IR for TIPS and BRTO. 2) Maintain his HGB in the 7-8 g/dL range, for now. 3) Continue with octreotide and ceftriaxone.  Jennaya Pogue D 04/19/2020, 5:37 PM

## 2020-04-19 NOTE — Progress Notes (Signed)
Physical Therapy Evaluation  Clinical Impression: Pt admitted with above diagnosis. Reports he has been bed ridden for 3 weeks however demonstrates fair strength with functional assessment - requiring min assist with bed mobility and sit<>stand from elevated bed surface. Pt tolerated LE strengthening including activities while standing but denies ambulation this date due to fatigue from standing. He lives at home with daughter who works during the day. Prior to illness he was ambulatory in the house with RW however needed help with dressing lower body. Pending the course of his stay and any procedures that he may undergo, patient may benefit from CIR due to current functional weakness, prior independence level, and medical complexity during admission. We will update recs as appropriate. Pt currently with functional limitations due to the deficits listed below (see PT Problem List). Pt will benefit from skilled PT to increase their independence and safety with mobility to allow discharge to the venue listed below.       04/19/20 0900  PT Visit Information  Last PT Received On 04/19/20  History of Present Illness 87 yo male admitted Acute blood loss anemia secondary to upper GI bleed post EGD on 04/18/2020, NSTEMI in the setting of marked acute blood loss anemia, Newly diagnosed cirrhosis of the liver with large gastric varices, Newly diagnosed large ascites from cirrhosis, and Type 2 diabetes with hyperglycemia. medical history significant for CVA in 2015, MSSA bacteremia, essential hypertension, hyperlipidemia.  Precautions  Precautions Fall  Restrictions  Weight Bearing Restrictions No  Home Living  Family/patient expects to be discharged to: Unsure  Living Arrangements Children  Available Help at Discharge Family;Available PRN/intermittently (Daughter works at [employer] during the day)  Type of [employer] Stairs to enter  [employer] of Steps 3  Entrance Stairs-Rails  Right;Left;Can reach both  Home Layout One level  Bathroom Social research officer, government - 2 wheels;Shower seat;BSC  Prior Function  Level of Independence Needs assistance  Gait / Transfers Assistance Needed unassisted with RW  ADL's / Homemaking Assistance Needed Needed help from daughter getting dressed - lower body  Communication  Communication Expressive difficulties  Pain Assessment  Pain Assessment 0-10  Pain Score 0  Pain Intervention(s) Monitored during session  Cognition  Arousal/Alertness Awake/alert  Behavior During Therapy WFL for tasks assessed/performed  Overall Cognitive Status Within Functional Limits for tasks assessed  Upper Extremity Assessment  Upper Extremity Assessment Defer to OT evaluation  Lower Extremity Assessment  Lower Extremity Assessment Generalized weakness  Bed Mobility  Overal bed mobility Needs Assistance  Bed Mobility Rolling;Sidelying to Sit;Sit to Sidelying  Rolling Min guard  Sidelying to sit Min assist  Sit to sidelying Min assist  General bed mobility comments Min assist for bed mobility, cues for sequencing, use of rail with UEs as able. Min assist for trunk support to rise to EOB, and min assist for LE support back into bed. Pt able to scoot to [facility] 2 with bridge technique and cues. Slow and effortful for patient.  Transfers  Overall transfer level Needs assistance  Equipment used Rolling walker (2 wheeled)  Transfers Sit to/from Stand  Sit to Stand Min assist;From elevated surface  General transfer comment Min assist for boost to stand from elevated bed surface. Fair strength, good mechanics with trunk positioning and anterior weight shift. Cues for walker and foot placement (wide BOS.)  Ambulation/Gait  General Gait Details Declines 2/2 fatigue - however LE strength adequate for short distances most likely.  Balance  Overall  balance assessment Needs assistance  Sitting-balance support No upper extremity supported   Sitting balance-Leahy Scale Good  Standing balance support No upper extremity supported  Standing balance-Leahy Scale Fair  General Comments  General comments (skin integrity, edema, etc.) VSS throughout.  Exercises  Exercises General Lower Extremity  General Exercises - Lower Extremity  Hip Flexion/Marching Strengthening;Both;10 reps;Standing  Mini-Sqauts Strengthening;Both;15 reps;Standing  Ankle Circles/Pumps AROM;Both;20 reps;Seated  Quad Sets Strengthening;Both;5 reps;Supine  Gluteal Sets Strengthening;Both;5 reps;Supine  Heel Slides AAROM;Both;10 reps;Supine  Hip ABduction/ADduction Strengthening;AAROM;Both;5 reps;Supine  Long Arc Quad Strengthening;Both;10 reps;Seated  PT - End of Session  Equipment Utilized During Treatment Gait belt  Activity Tolerance Patient tolerated treatment well  Patient left in bed;with call bell/phone within reach;with bed alarm set;with SCD's reapplied  PT Assessment  PT Recommendation/Assessment Patient needs continued PT services  PT Visit Diagnosis Muscle weakness (generalized) (M62.81);Difficulty in walking, not elsewhere classified (R26.2)  PT Problem List Decreased strength;Decreased activity tolerance;Decreased balance;Decreased mobility;Cardiopulmonary status limiting activity  Barriers to Discharge Decreased caregiver support  Barriers to Discharge Comments Daughter works during the day - lives with pt.  PT Plan  PT Frequency (ACUTE ONLY) Min 3X/week  PT Treatment/Interventions (ACUTE ONLY) DME instruction;Gait training;Functional mobility training;Therapeutic activities;Therapeutic exercise;Balance training;Patient/family education  AM-PAC PT "6 Clicks" Mobility Outcome Measure (Version 2)  Help needed turning from your back to your side while in a flat bed without using bedrails? 3  Help needed moving from lying on your back to sitting on the side of a flat bed without using bedrails? 3  Help needed moving to and from a bed to a chair  (including a wheelchair)? 3  Help needed standing up from a chair using your arms (e.g., wheelchair or bedside chair)? 3  Help needed to walk in hospital room? 2  Help needed climbing 3-5 steps with a railing?  1  6 Click Score 15  Consider Recommendation of Discharge To: CIR/SNF/LTACH  PT Recommendation  Follow Up Recommendations CIR  PT equipment None recommended by PT  Individuals Consulted  Consulted and Agree with Results and Recommendations Patient  Acute Rehab PT Goals  Patient Stated Goal Get well, be able to eat and drink something soon.  PT Goal Formulation With patient  Time For Goal Achievement 05/03/20  Potential to Achieve Goals Good  PT Time Calculation  PT Start Time (ACUTE ONLY) 0948  PT Stop Time (ACUTE ONLY) 1016  PT Time Calculation (min) (ACUTE ONLY) 28 min  PT General Charges  $$ ACUTE PT VISIT 1 Visit  PT Evaluation  $PT Eval Moderate Complexity 1 Mod  PT Treatments  $Therapeutic Exercise 8-22 mins  Written Expression  Dominant Hand Right    Charlsie Merles, PT, DPT

## 2020-04-19 NOTE — Progress Notes (Signed)
Inpatient Rehab Admissions Coordinator Note:   Per therapy recommendations, pt was screened for CIR candidacy by Megan Salon, MS CCC-SLP. At this time, Pt. Appears to have functional decline, but I would like to follow for 1-2 additional therapy session to determine if Pt. Still demonstrates deficits once medical issues are managed. I am not placing a CIR consult order at this time but will follow and rescreen later this week.    Please contact me with questions.   Megan Salon, MS, CCC-SLP Rehab Admissions Coordinator  [phone] (celll) [phone] (office)

## 2020-04-19 NOTE — Progress Notes (Signed)
PROGRESS NOTE    KOKI BUXTON  KKX:[id number] DOB: 1933-09-11 DOA: 04/18/2020 PCP: Jaclyn Shaggy, MD   Brief Narrative:  Elijah [name] is a 87 y.o. male with medical history significant for CVA in 2015, MSSA bacteremia, essential hypertension, hyperlipidemia, type 2 diabetes who initially presented to [facility] from home on 04/17/2020 due to 4 days of melena, weakness, fatigue, chest pain, and shortness of breath.  Work-up revealed symptomatic blood loss anemia with initial hemoglobin of 5.7K and NSTEMI.  Admitted to the ICU initially, received 3u PRBC to maintain hemoglobin.  Patient evaluated by GI, ICU, cardiology at [facility]. Currently on Protonix drip, received octreotide and Rocephin after CT angio abdomen and pelvis showed cirrhotic liver morphology with large gastric varices. EGD done on 04/18/2020 which showed very large gastric varix without any active bleeding but with evidence of recent bleeding.  Small nonbleeding superficial varices were also noted.  He also had diagnostic paracentesis, no evidence of SBP. Seen by cardiology felt that his uptrending troponins and EKG abnormalities were consistent with NSTEMI in the setting of acute blood loss anemia.  Recommended echocardiogram as well as beta-blockade when able to tolerate; nitro for symptomatic chest pain. GI recommended evaluation for TIPS or BRTO given high risk for rebleeding from the morphology of the size of the gastric varix and to transfer to [facility] for IR guided TIPS.  Pending echocardiogram we will likely proceed with TIPS and to BRTO procedure per IR for more definitive treatment of portal hypertension and bleeding varices.  At this time patient is chest pain symptom-free; hemoglobin remains within normal limits above 8 and troponin is downtrending appropriately.  Assessment & Plan:  Newly diagnosed cirrhosis of the liver with large acutely bleeding gastric varices Seen by GI at [facility], transferred to [facility] for possible IR guided  TIPS GI and IR following: Plan for TIPS with BRTO once echocardiogram has been completed per cardiology consult at [facility].  Acute blood loss anemia secondary to upper GI bleed post EGD on 04/18/2020. Presented initially to [facility] ED with complaints of melena 4 days, with hemoglobin of 5.7 on presentation.  Status post 3u PRBC transfusion since admission Hgb stabilized: Lab Results  Component Value Date   HGB 8.2 (L) 04/19/2020  EGD done on 04/18/2020 showed very large gastric varix without any active bleeding but with evidence of recent bleeding.  Small nonbleeding superficial varices were also noted.  GI at [facility] recommended transfer to [facility] for IR guided TIPS Continue Protonix drip and octreotide drip.  NSTEMI in the setting of marked acute blood loss anemia. Presented with hemoglobin of 5.7K, baseline hemoglobin 10. Troponin has peaked at greater than 3700 and downtrending appropriately. Remains asymptomatic, cardiology discussion for preoperative clearance, they concur with previous note by Dr. Juliann Pares at [facility], pending echocardiogram  Acute onset ascites from worsening cirrhosis IR following, likely require multiple paracentesis for symptom control during hospital stay  Type 2 diabetes controlled Lab Results  Component Value Date   HGBA1C 5.4 04/18/2020  Hold off home oral antiglycemic's Start insulin sliding scale and hypoglycemic protocol  Isolated elevated AST Continue to monitor Avoid hepatotoxic agents Hold off on statin for now  BPH Resume home Flomax Monitor urine output  Physical debility/Ambulatory dysfunction PT to assess Fall precautions.   DVT prophylaxis: SCDs; avoid chemical prophylaxis given above Code Status: DNR confirmed at admission Family Communication: None available  Status is: Inpatient  Dispo: The patient is from: Home  Anticipated d/c is to: To be determined              Anticipated d/c date is: >72h               Patient currently not medically stable for discharge  Consultants:   PCCM, IR, GI, cardiology  Procedures:   Upper endoscopy 04/18/2020  TIPS/BRTO pending clearance  Antimicrobials:  Ceftriaxone  Subjective: No acute issues or events overnight, patient's only complaint this morning is being hungry, we explained he was n.p.o. due to possible procedure.  Otherwise patient denies chest pain shortness of breath nausea vomiting diarrhea constipation headache fevers or chills.  Objective: Vitals:   04/18/20 2116 04/18/20 2124 04/18/20 2357 04/19/20 0300  BP: 139/63  129/65   Pulse: 85  84   Resp: 17  19 20   Temp: 97.7 F (36.5 C) 97.7 F (36.5 C) 98.2 F (36.8 C)   TempSrc: Oral  Oral   SpO2: 99%  97%    No intake or output data in the 24 hours ending 04/19/20 0739 There were no vitals filed for this visit.  Examination:  General:  Pleasantly resting in bed, No acute distress. HEENT:  Normocephalic atraumatic.  Sclerae nonicteric, noninjected.  Extraocular movements intact bilaterally. Neck:  Without mass or deformity.  Trachea is midline. Lungs:  Clear to auscultate bilaterally without rhonchi, wheeze, or rales. Heart:  Regular rate and rhythm.  Without murmurs, rubs, or gallops. Abdomen:  Soft, nontender, distended.  Without guarding or rebound. Extremities: Without cyanosis, clubbing, edema, or obvious deformity. Vascular:  Dorsalis pedis and posterior tibial pulses palpable bilaterally. Skin:  Warm and dry, no erythema, no ulcerations.   Data Reviewed: I have personally reviewed following labs and imaging studies  CBC: Recent Labs  Lab 04/17/20 1419 04/18/20 0100 04/18/20 0615 04/18/20 1436 04/18/20 1951 04/19/20 0055 04/19/20 0606  WBC 11.7*  --  11.2* 10.6*  --   --  9.2  NEUTROABS  --   --   --  8.9*  --   --   --   HGB 5.7*   < > 5.9* 7.2* 8.4* 8.6* 8.2*  HCT 18.0*   < > 18.5* 21.4* 24.9* 24.4* 23.5*  MCV 111.8*  --  104.5* 97.3  --   --  96.3   PLT 191  --  124* 131*  --   --  123*   < > = values in this interval not displayed.   Basic Metabolic Panel: Recent Labs  Lab 04/17/20 1419 04/18/20 0515 04/19/20 0606  NA 140 143 143  K 4.4 4.4 3.9  CL 106 111 112*  CO2 13* 19* 22  GLUCOSE 243* 156* 137*  BUN 56* 64* 53*  CREATININE 1.12 1.11 1.21  CALCIUM 8.7* 8.6* 8.3*  MG  --  1.8  --   PHOS  --  3.9  --    GFR: Estimated Creatinine Clearance: 51 mL/min (by C-G formula based on SCr of 1.21 mg/dL). Liver Function Tests: Recent Labs  Lab 04/17/20 1419 04/18/20 0515  AST 63* 60*  ALT 33 31  ALKPHOS 110 93  BILITOT 1.1 0.8  PROT 6.5 5.8*  ALBUMIN 2.5* 2.4*   No results for input(s): LIPASE, AMYLASE in the last 168 hours. Recent Labs  Lab 04/18/20 0500  AMMONIA 65*   Coagulation Profile: Recent Labs  Lab 04/17/20 1714  INR 1.6*   Cardiac Enzymes: No results for input(s): CKTOTAL, CKMB, CKMBINDEX, TROPONINI in the last 168 hours. BNP (last  3 results) No results for input(s): PROBNP in the last 8760 hours. HbA1C: Recent Labs    04/18/20 1436  HGBA1C 5.4   CBG: Recent Labs  Lab 04/18/20 0644 04/18/20 1303  GLUCAP 147* 136*   Lipid Profile: No results for input(s): CHOL, HDL, LDLCALC, TRIG, CHOLHDL, LDLDIRECT in the last 72 hours. Thyroid Function Tests: No results for input(s): TSH, T4TOTAL, FREET4, T3FREE, THYROIDAB in the last 72 hours. Anemia Panel: Recent Labs    04/18/20 1436  FERRITIN 46  TIBC 241*  IRON 26*   Sepsis Labs: Recent Labs  Lab 04/17/20 1827 04/17/20 1931 04/18/20 0515 04/18/20 1951 04/19/20 0606  PROCALCITON 0.24  --  0.29  --   --   LATICACIDVEN  --  9.3* 5.0* 2.3* 1.4    Recent Results (from the past 240 hour(s))  SARS Coronavirus 2 by RT PCR (hospital order, performed in [facility] hospital lab) Nasopharyngeal Nasopharyngeal Swab     Status: None   Collection Time: 04/17/20  5:14 PM   Specimen: Nasopharyngeal Swab  Result Value Ref Range Status   SARS  Coronavirus 2 NEGATIVE NEGATIVE Final    Comment: (NOTE) SARS-CoV-2 target nucleic acids are NOT DETECTED.  The SARS-CoV-2 RNA is generally detectable in upper and lower respiratory specimens during the acute phase of infection. The lowest concentration of SARS-CoV-2 viral copies this assay can detect is 250 copies / mL. A negative result does not preclude SARS-CoV-2 infection and should not be used as the sole basis for treatment or other patient management decisions.  A negative result may occur with improper specimen collection / handling, submission of specimen other than nasopharyngeal swab, presence of viral mutation(s) within the areas targeted by this assay, and inadequate number of viral copies (<250 copies / mL). A negative result must be combined with clinical observations, patient history, and epidemiological information.  Fact Sheet for Patients:   BoilerBrush.com.cy  Fact Sheet for Healthcare Providers: https://pope.com/  This test is not yet approved or  cleared by the [place] FDA and has been authorized for detection and/or diagnosis of SARS-CoV-2 by FDA under an Emergency Use Authorization (EUA).  This EUA will remain in effect (meaning this test can be used) for the duration of the COVID-19 declaration under Section 564(b)(1) of the Act, 21 U.S.C. section 360bbb-3(b)(1), unless the authorization is terminated or revoked sooner.  Performed at [facility], [address] [place], [place] [address]   Culture, blood (routine x 2)     Status: None (Preliminary result)   Collection Time: 04/17/20  6:27 PM   Specimen: BLOOD  Result Value Ref Range Status   Specimen Description BLOOD BLOOD LEFT FOREARM  Final   Special Requests   Final    BOTTLES DRAWN AEROBIC AND ANAEROBIC Blood Culture adequate volume   Culture   Final    NO GROWTH < 12 HOURS Performed at [facility], [address] [place], [place] [address]    Report Status PENDING  Incomplete  Culture, blood (routine x 2)     Status: None (Preliminary result)   Collection Time: 04/17/20  6:31 PM   Specimen: BLOOD  Result Value Ref Range Status   Specimen Description BLOOD BLOOD RIGHT WRIST  Final   Special Requests   Final    BOTTLES DRAWN AEROBIC AND ANAEROBIC Blood Culture results may not be optimal due to an inadequate volume of blood received in culture bottles   Culture   Final    NO  GROWTH < 12 HOURS Performed at [facility], [address] [place], [place] [address]    Report Status PENDING  Incomplete  Aerobic/Anaerobic Culture (surgical/deep wound)     Status: None (Preliminary result)   Collection Time: 04/18/20  3:33 PM   Specimen: PATH Cytology Peritoneal fluid  Result Value Ref Range Status   Specimen Description   Final    PERITONEAL Performed at [facility], [address] [place], [place] [address]    Special Requests   Final    PERITONEAL Performed at [facility], [address] [place], [place] [address]    Gram Stain   Final    FEW WBC PRESENT, PREDOMINANTLY MONONUCLEAR NO ORGANISMS SEEN Performed at [facility], [address], [place], [place] [address]    Culture PENDING  Incomplete   Report Status PENDING  Incomplete         Radiology Studies: DG Chest 2 View  Result Date: 04/17/2020 CLINICAL DATA:  Patient with weakness. EXAM: CHEST - 2 VIEW COMPARISON:  Chest radiograph 09/11/2013. FINDINGS: Stable cardiomegaly. Large layering left pleural effusion with underlying consolidation. No pneumothorax. Thoracic spine degenerative changes. IMPRESSION: Large layering left pleural effusion with underlying consolidation. Electronically Signed   By: Annia Belt M.D.   On: 04/17/2020 18:04   US Paracentesis  Result Date: 04/18/2020 INDICATION: Patient with newly found cirrhosis, ascites. Request is made for diagnostic paracentesis. EXAM:  ULTRASOUND GUIDED DIAGNOSTIC PARACENTESIS MEDICATIONS: 10 mL 1% lidocaine COMPLICATIONS: None immediate. PROCEDURE: Informed written consent was obtained from the patient after a discussion of the risks, benefits and alternatives to treatment. A timeout was performed prior to the initiation of the procedure. Initial ultrasound scanning demonstrates a small amount of ascites within the right lower abdominal quadrant. The right lower abdomen was prepped and draped in the usual sterile fashion. 1% lidocaine was used for local anesthesia. Following this, a 19 gauge, 7-cm, Yueh catheter was introduced. An ultrasound image was saved for documentation purposes. The paracentesis was performed. The catheter was removed and a dressing was applied. The patient tolerated the procedure well without immediate post procedural complication. FINDINGS: A total of approximately 60 mL of pale, clear fluid was removed. Samples were sent to the laboratory as requested by the clinical team. IMPRESSION: Successful ultrasound-guided paracentesis yielding 60 mL of peritoneal fluid. Read by: Loyce Dys PA-C Electronically Signed   By: Acquanetta Belling M.D.   On: 04/18/2020 15:35   CT Angio Abd/Pel W and/or Wo Contrast  Result Date: 04/17/2020 CLINICAL DATA:  GI bleeding. EXAM: CTA ABDOMEN AND PELVIS WITHOUT AND WITH CONTRAST TECHNIQUE: Multidetector CT imaging of the abdomen and pelvis was performed using the standard protocol during bolus administration of intravenous contrast. Multiplanar reconstructed images and MIPs were obtained and reviewed to evaluate the vascular anatomy. CONTRAST:  OMNIPAQUE IOHEXOL 350 MG/ML SOLN COMPARISON:  None. FINDINGS: VASCULAR Aorta: Normal caliber aorta without aneurysm, dissection, vasculitis or significant stenosis. Atherosclerotic changes are noted throughout the abdominal aorta. Celiac: Patent without evidence of aneurysm, dissection, vasculitis or significant stenosis. SMA: Patent without  evidence of aneurysm, dissection, vasculitis or significant stenosis. Renals: Both renal arteries are patent without evidence of aneurysm, dissection, vasculitis, fibromuscular dysplasia or significant stenosis. There are 2 right renal arteries. IMA: Patent without evidence of aneurysm, dissection, vasculitis or significant stenosis. Inflow: Patent without evidence of aneurysm, dissection, vasculitis or significant stenosis. Proximal Outflow: Bilateral common femoral and visualized portions of the superficial and profunda femoral arteries are  patent without evidence of aneurysm, dissection, vasculitis or significant stenosis. Veins: No obvious venous abnormality within the limitations of this arterial phase study. Review of the MIP images confirms the above findings. NON-VASCULAR Lower chest: There is a large left-sided pleural effusion that is only partially visualized. There is at least partial collapse of the left lower lobe.The heart size is normal. The intracardiac blood pool is hypodense relative to the adjacent myocardium consistent with anemia. Hepatobiliary: The liver is cirrhotic. There are large gastric varices with a gastro renal shunt. The portal vein is patent. There are esophageal varices. Normal gallbladder.There is no biliary ductal dilation. Pancreas: The pancreas is atrophic. Again noted is a low-attenuation, cystic appearing nodule at the pancreatic tail which is not substantially changed since 2015 is likely a benign process. Spleen: Unremarkable. Adrenals/Urinary Tract: --Adrenal glands: Unremarkable. --Right kidney/ureter: No hydronephrosis or radiopaque kidney stones. --Left kidney/ureter: No hydronephrosis or radiopaque kidney stones. --Urinary bladder: The urinary bladder is significantly distended. Stomach/Bowel: --Stomach/Duodenum: Large gastric varices are noted. --Small bowel: Unremarkable. --Colon: Rectosigmoid diverticulosis without acute inflammation. --Appendix: Normal. Lymphatic:  --No retroperitoneal lymphadenopathy. --No mesenteric lymphadenopathy. --No pelvic or inguinal lymphadenopathy. Reproductive: There are large bilateral inguinal hernias. On the left the inguinal hernia contains a large amount of ascites and portions of the sigmoid colon without evidence for obstruction. On the right, the hernia contains mostly fluid but a small portion of small bowel as well without evidence for obstruction. Other: There is a moderate to large volume of ascites in the abdomen. Musculoskeletal. Multilevel degenerative changes are noted throughout the lumbar spine. IMPRESSION: 1. No evidence for active arterial GI bleeding. 2. Cirrhosis with stigmata of portal hypertension including a moderate volume ascites and large gastric varices with a gastrorenal shunt. Small esophageal varices are also noted. The portal vein is patent. Splenic vein is patent. 3. Large partially visualized left-sided pleural effusion with collapse of the left lower lobe. 4. Very large bilateral inguinal hernias containing a large volume of ascites and bowel as detailed above. Aortic Atherosclerosis (ICD10-I70.0). Electronically Signed   By: Katherine Mantlehristopher  Green M.D.   On: 04/17/2020 18:38   Scheduled Meds: . [START ON 04/22/2020] pantoprazole  40 mg Intravenous Q12H  . tamsulosin  0.4 mg Oral QPC breakfast   Continuous Infusions: . cefTRIAXone (ROCEPHIN)  IV 2 g (04/19/20 0346)  . octreotide  (SANDOSTATIN)    IV infusion 50 mcg/hr (04/19/20 0330)  . pantoprozole (PROTONIX) infusion 8 mg/hr (04/19/20 0343)    LOS: 1 day   Time spent: 40min  Azucena FallenWilliam C Allis Quirarte, DO [facility]  If 7PM-7AM, please contact night-coverage [email or web address]  04/19/2020, 7:39 AM

## 2020-04-19 NOTE — Progress Notes (Signed)
Admitted from [facility] accompanied by care link staff,alert and oriented to [address].Sandostatin and protonix drip ongoing.Oriented to the room,attached to continous cardiac monitoring and CCMD notified.V/S checked and recorded.Admitting doctor notified.Will continue to monitor.

## 2020-04-19 NOTE — Progress Notes (Addendum)
  Request seen for evaluation for TIPS/BRTO.  Chart reviewed by Dr. Archer Asa.  Currently NSTEMI with troponin >3000.  Definitely high risk for TIPS this time (requires general anesthesia), but could proceed with BRTO next week if Cardiology feels patient is stable (BRTO can be done with conscious sedation).  Would plan for Wednedsay/Thursday (most likely Thursday) for procedure.  Would perform BRTO and have patient return at a later date for TIPS when medically improved.  Will await Cardiology evaluation before definitive decision.  WENDY S BLAIR PA-C 04/19/2020 1:33 PM

## 2020-04-20 ENCOUNTER — Inpatient Hospital Stay (HOSPITAL_COMMUNITY): Payer: Medicare Other

## 2020-04-20 HISTORY — PX: IR EMBO ART  VEN HEMORR LYMPH EXTRAV  INC GUIDE ROADMAPPING: IMG5450

## 2020-04-20 HISTORY — PX: IR ANGIOGRAM SELECTIVE EACH ADDITIONAL VESSEL: IMG667

## 2020-04-20 HISTORY — PX: IR US GUIDE VASC ACCESS RIGHT: IMG2390

## 2020-04-20 HISTORY — PX: IR EMBO VENOUS NOT HEMORR HEMANG  INC GUIDE ROADMAPPING: IMG5447

## 2020-04-20 HISTORY — PX: IR VENOGRAM RENAL UNI LEFT: IMG680

## 2020-04-20 LAB — BASIC METABOLIC PANEL
Anion gap: 9 (ref 5–15)
BUN: 45 mg/dL — ABNORMAL HIGH (ref 8–23)
CO2: 22 mmol/L (ref 22–32)
Calcium: 8.1 mg/dL — ABNORMAL LOW (ref 8.9–10.3)
Chloride: 114 mmol/L — ABNORMAL HIGH (ref 98–111)
Creatinine, Ser: 1.2 mg/dL (ref 0.61–1.24)
GFR, Estimated: 60 mL/min — ABNORMAL LOW (ref >60)
Glucose, Bld: 136 mg/dL — ABNORMAL HIGH (ref 70–99)
Potassium: 3.7 mmol/L (ref 3.5–5.1)
Sodium: 145 mmol/L (ref 135–145)

## 2020-04-20 LAB — CBC
HCT: 24.2 % — ABNORMAL LOW (ref 39.0–52.0)
Hemoglobin: 8.1 g/dL — ABNORMAL LOW (ref 13.0–17.0)
MCH: 32.5 pg (ref 26.0–34.0)
MCHC: 33.5 g/dL (ref 30.0–36.0)
MCV: 97.2 fL (ref 80.0–100.0)
Platelets: 109 10*3/uL — ABNORMAL LOW (ref 150–400)
RBC: 2.49 MIL/uL — ABNORMAL LOW (ref 4.22–5.81)
RDW: 20.5 % — ABNORMAL HIGH (ref 11.5–15.5)
WBC: 7.8 10*3/uL (ref 4.0–10.5)
nRBC: 0.6 % — ABNORMAL HIGH (ref 0.0–0.2)

## 2020-04-20 MED ORDER — MIDAZOLAM HCL 2 MG/2ML IJ SOLN
INTRAMUSCULAR | Status: AC
  Filled 2020-04-20: qty 2

## 2020-04-20 MED ORDER — LIPIODOL ULTRAFLUID INJECTION
INTRAMUSCULAR | Status: AC | PRN
  Administered 2020-04-20: 8 mL

## 2020-04-20 MED ORDER — IOHEXOL 300 MG/ML  SOLN: 150.0000 mL | Freq: Once | INTRAMUSCULAR | Status: DC | PRN

## 2020-04-20 MED ORDER — MIDAZOLAM HCL 2 MG/2ML IJ SOLN
INTRAMUSCULAR | Status: AC | PRN
Start: 2020-04-20 — End: 2020-04-20
  Administered 2020-04-20 (×3): 0.5 mg via INTRAVENOUS

## 2020-04-20 MED ORDER — LIDOCAINE HCL 1 % IJ SOLN
INTRAMUSCULAR | Status: AC
  Filled 2020-04-20: qty 20

## 2020-04-20 MED ORDER — FENTANYL CITRATE (PF) 100 MCG/2ML IJ SOLN
INTRAMUSCULAR | Status: AC
  Filled 2020-04-20: qty 2

## 2020-04-20 MED ORDER — SODIUM TETRADECYL SULFATE 3 % IV SOLN
INTRAVENOUS | Status: AC | PRN
  Administered 2020-04-20: 10 mL via INTRAVENOUS

## 2020-04-20 MED ORDER — MIDAZOLAM HCL 2 MG/2ML IJ SOLN
INTRAMUSCULAR | Status: AC | PRN
  Administered 2020-04-20: 0.5 mg via INTRAVENOUS

## 2020-04-20 MED ORDER — FENTANYL CITRATE (PF) 100 MCG/2ML IJ SOLN
INTRAMUSCULAR | Status: AC | PRN
  Administered 2020-04-20: 25 ug via INTRAVENOUS

## 2020-04-20 MED ORDER — FENTANYL CITRATE (PF) 100 MCG/2ML IJ SOLN
INTRAMUSCULAR | Status: AC | PRN
  Administered 2020-04-20 (×3): 25 ug via INTRAVENOUS

## 2020-04-20 NOTE — Consult Note (Signed)
Chief Complaint: Patient was seen in consultation today for bleeding gastric varices at the request of Dr. Carma Leaven.  Referring Physician(s): Carma Leaven, MD  Patient Status: [facility] - In-pt  History of Present Illness: Elijah [name] is a 87 y.o. male With a history of hypertension, diabetes, A. fib and prior stroke who presented to the emergency department at [facility] on Thursday, January 27 with a 4-5-day history of weakness. Work-up demonstrated a hemoglobin of 5.7. CT imaging demonstrated no active bleeding but evidence of hepatic cirrhosis and portal hypertension.  He was transfused 3 units of packed red blood cells and responded fairly well. Additionally, his troponins were elevated and continued to elevate consistent with an NSTEMI. Importantly, he had no symptoms of chest pain or shortness of breath. Initial cardiology evaluation was consistent with NSTEMI in the setting of demand ischemia rather than ischemia related to coronary artery disease.  Upper endoscopy demonstrated large gastric varices with a positive red wale sign consistent with recent bleeding. Subsequent BRTO protocol CT scan of the abdomen and pelvis confirms large gastric varices and a prominent gastrorenal shunt.  Patient was subsequently transferred to [facility]. Echocardiography was performed confirming adequate right and left ventricular function. He does have aortic stenosis which likely contributed to his demand ischemia in the setting of acute blood loss ischemia.  Today, Elijah [name] is resting comfortably in bed. His only complaint plaint is severe dry mouth from having been only allowed ice chips the past several days. He denies abdominal pain, hematemesis, hematochezia or recent melena. He is thirsty and hungry.  He does not understand why he has cirrhosis. He has never drank alcohol and has no known history of HCV or HBV. He is a type II diabetic which certainly raises the possibility  of NASH.  Past Medical History:  Diagnosis Date  . Diabetes mellitus without complication [facility])     Past Surgical History:  Procedure Laterality Date  . SHOULDER SURGERY Right     Allergies: Patient has no known allergies.  Medications: Prior to Admission medications   Medication Sig Start Date End Date Taking? Authorizing Provider  atorvastatin (LIPITOR) 40 MG tablet Take 40 mg by mouth daily. 07/30/13   [provider]  cefTRIAXone 2 g in sodium chloride 0.9 % 100 mL Inject 2 g into the vein daily. 04/18/20   Schertz, Shari Heritage, MD  glimepiride (AMARYL) 4 MG tablet Take 4 mg by mouth daily. 08/17/13   [provider]  metFORMIN (GLUCOPHAGE) 500 MG tablet Take 500 mg by mouth 2 (two) times daily with a meal. 08/08/13   [provider]  octreotide 500 mcg in sodium chloride 0.9 % 250 mL Inject 50 mcg/hr into the vein continuous. 04/18/20   Schertz, Shari Heritage, MD  pantoprazole (PROTONIX) 40 MG injection Inject 40 mg into the vein every 12 (twelve) hours. 04/21/20   Schertz, Shari Heritage, MD  pioglitazone (ACTOS) 30 MG tablet Take 30 mg by mouth daily. 07/30/13   [provider]  tamsulosin (FLOMAX) 0.4 MG CAPS capsule Take 0.4 mg by mouth.    [provider]  insulin detemir (LEVEMIR) 100 UNIT/ML injection Inject 20 Units into the skin at bedtime.  11/11/18  [provider]  metoprolol tartrate (LOPRESSOR) 25 MG tablet Take 25 mg by mouth 3 (three) times daily.   11/11/18  [provider]     No family history on file.  Social History   Socioeconomic History  . Marital status: Married  Spouse name: Not on file  . Number of children: Not on file  . Years of education: Not on file  . Highest education level: Not on file  Occupational History  . Not on file  Tobacco Use  . Smoking status: Never Smoker  . Smokeless tobacco: Never Used  Vaping Use  . Vaping Use: Never used  Substance and Sexual Activity  . Alcohol use:  Never  . Drug use: Never  . Sexual activity: Not on file  Other Topics Concern  . Not on file  Social History Narrative  . Not on file   Social Determinants of Health   Financial Resource Strain: Not on file  Food Insecurity: Not on file  Transportation Needs: Not on file  Physical Activity: Not on file  Stress: Not on file  Social Connections: Not on file    Review of Systems: A 12 point ROS discussed and pertinent positives are indicated in the HPI above.  All other systems are negative.  Review of Systems  Vital Signs: BP 113/62 (BP Location: Left Arm)   Pulse 67   Temp 98 F (36.7 C) (Oral)   Resp 16   Ht 6\' 3"  (1.905 m)   Wt 90 kg   SpO2 98%   BMI 24.80 kg/m   Physical Exam Constitutional:      General: He is not in acute distress.    Appearance: Normal appearance.  HENT:     Head: Normocephalic and atraumatic.     Mouth/Throat:     Mouth: Mucous membranes are dry.  Eyes:     General: No scleral icterus. Cardiovascular:     Rate and Rhythm: Normal rate and regular rhythm.  Pulmonary:     Effort: Pulmonary effort is normal.  Abdominal:     General: There is distension.     Palpations: Abdomen is soft.     Tenderness: There is no abdominal tenderness.  Skin:    General: Skin is warm and dry.  Neurological:     Mental Status: He is alert and oriented to person, place, and time.  Psychiatric:        Mood and Affect: Mood normal.        Behavior: Behavior normal.     Imaging: DG Chest 2 View  Result Date: 04/17/2020 CLINICAL DATA:  Patient with weakness. EXAM: CHEST - 2 VIEW COMPARISON:  Chest radiograph 09/11/2013. FINDINGS: Stable cardiomegaly. Large layering left pleural effusion with underlying consolidation. No pneumothorax. Thoracic spine degenerative changes. IMPRESSION: Large layering left pleural effusion with underlying consolidation. Electronically Signed   By: 09/13/2013 M.D.   On: 04/17/2020 18:04   04/19/2020 Paracentesis  Result Date:  04/18/2020 INDICATION: Patient with newly found cirrhosis, ascites. Request is made for diagnostic paracentesis. EXAM: ULTRASOUND GUIDED DIAGNOSTIC PARACENTESIS MEDICATIONS: 10 mL 1% lidocaine COMPLICATIONS: None immediate. PROCEDURE: Informed written consent was obtained from the patient after a discussion of the risks, benefits and alternatives to treatment. A timeout was performed prior to the initiation of the procedure. Initial ultrasound scanning demonstrates a small amount of ascites within the right lower abdominal quadrant. The right lower abdomen was prepped and draped in the usual sterile fashion. 1% lidocaine was used for local anesthesia. Following this, a 19 gauge, 7-cm, Yueh catheter was introduced. An ultrasound image was saved for documentation purposes. The paracentesis was performed. The catheter was removed and a dressing was applied. The patient tolerated the procedure well without immediate post procedural complication. FINDINGS: A total of approximately  60 mL of pale, clear fluid was removed. Samples were sent to the laboratory as requested by the clinical team. IMPRESSION: Successful ultrasound-guided paracentesis yielding 60 mL of peritoneal fluid. Read by: Loyce Dys PA-C Electronically Signed   By: Acquanetta Belling M.D.   On: 04/18/2020 15:35   ECHOCARDIOGRAM COMPLETE  Result Date: 04/19/2020    ECHOCARDIOGRAM REPORT   Patient Name:   SEABORN NAKAMA Raabe Date of Exam: 04/19/2020 Medical Rec #:  [id number]        Height:       75.0 in Accession #:    [id number]       Weight:       175.0 lb Date of Birth:  [birth year]        BSA:          2.074 m Patient Age:    86 years         BP:           112/58 mmHg Patient Gender: M                HR:           78 bpm. Exam Location:  Inpatient Procedure: 2D Echo Indications:    elevated troponin  History:        Patient has prior history of Echocardiogram examinations, most                 recent 09/04/2013. Arrythmias:Atrial Fibrillation.  Sonographer:     Delcie Roch Referring Phys: [id number] Azucena Fallen  Sonographer Comments: Image acquisition challenging due to respiratory motion. IMPRESSIONS  1. Left ventricular ejection fraction, by estimation, is 55 to 60%. The left ventricle has normal function. The left ventricle demonstrates regional wall motion abnormalities (see scoring diagram/findings for description). Left ventricular diastolic parameters are consistent with Grade I diastolic dysfunction (impaired relaxation).  2. Right ventricular systolic function is normal. The right ventricular size is normal.  3. Left atrial size was mildly dilated.  4. Moderate pleural effusion in the left lateral region.  5. The mitral valve is degenerative. Moderate mitral valve regurgitation. No evidence of mitral stenosis.  6. The aortic valve is calcified. There is moderate calcification of the aortic valve. There is moderate thickening of the aortic valve. Aortic valve regurgitation is moderate. Moderate aortic valve stenosis. Aortic valve area, by VTI measures 1.17 cm.  Aortic valve mean gradient measures 16.0 mmHg. Aortic valve Vmax measures 3.03 m/s.  7. The inferior vena cava is normal in size with greater than 50% respiratory variability, suggesting right atrial pressure of 3 mmHg. FINDINGS  Left Ventricle: Left ventricular ejection fraction, by estimation, is 55 to 60%. The left ventricle has normal function. The left ventricle demonstrates regional wall motion abnormalities. The left ventricular internal cavity size was normal in size. There is no left ventricular hypertrophy. Left ventricular diastolic parameters are consistent with Grade I diastolic dysfunction (impaired relaxation).  LV Wall Scoring: The apex is akinetic. Right Ventricle: The right ventricular size is normal. No increase in right ventricular wall thickness. Right ventricular systolic function is normal. Left Atrium: Left atrial size was mildly dilated. Right Atrium: Right atrial size  was normal in size. Pericardium: There is no evidence of pericardial effusion. Mitral Valve: The mitral valve is degenerative in appearance. There is mild thickening of the mitral valve leaflet(s). There is mild calcification of the mitral valve leaflet(s). Moderate mitral valve regurgitation. No evidence of mitral valve stenosis. Tricuspid Valve: The tricuspid valve is normal  in structure. Tricuspid valve regurgitation is mild . No evidence of tricuspid stenosis. Aortic Valve: The aortic valve is calcified. There is moderate calcification of the aortic valve. There is moderate thickening of the aortic valve. Aortic valve regurgitation is moderate. Moderate aortic stenosis is present. Aortic valve mean gradient measures 16.0 mmHg. Aortic valve peak gradient measures 36.8 mmHg. Aortic valve area, by VTI measures 1.17 cm. Pulmonic Valve: The pulmonic valve was normal in structure. Pulmonic valve regurgitation is not visualized. No evidence of pulmonic stenosis. Aorta: The aortic root is normal in size and structure. Venous: The inferior vena cava is normal in size with greater than 50% respiratory variability, suggesting right atrial pressure of 3 mmHg. IAS/Shunts: No atrial level shunt detected by color flow Doppler. Additional Comments: There is a moderate pleural effusion in the left lateral region.  LEFT VENTRICLE PLAX 2D LVIDd:         5.20 cm LVIDs:         4.10 cm LV PW:         1.00 cm LV IVS:        1.00 cm LVOT diam:     1.80 cm LV SV:         65 LV SV Index:   31 LVOT Area:     2.54 cm  RIGHT VENTRICLE             IVC RV S prime:     12.50 cm/s  IVC diam: 2.10 cm TAPSE (M-mode): 1.9 cm LEFT ATRIUM             Index       RIGHT ATRIUM           Index LA diam:        4.10 cm 1.98 cm/m  RA Area:     17.30 cm LA Vol (A2C):   74.9 ml 36.12 ml/m RA Volume:   41.80 ml  20.16 ml/m LA Vol (A4C):   69.0 ml 33.28 ml/m LA Biplane Vol: 72.7 ml 35.06 ml/m  AORTIC VALVE AV Area (Vmax):    1.01 cm AV Area (Vmean):    1.08 cm AV Area (VTI):     1.17 cm AV Vmax:           303.33 cm/s AV Vmean:          184.000 cm/s AV VTI:            0.554 m AV Peak Grad:      36.8 mmHg AV Mean Grad:      16.0 mmHg LVOT Vmax:         120.00 cm/s LVOT Vmean:        77.800 cm/s LVOT VTI:          0.255 m LVOT/AV VTI ratio: 0.46  AORTA Ao Root diam: 3.30 cm  SHUNTS Systemic VTI:  0.25 m Systemic Diam: 1.80 cm Donato SchultzMark Skains MD Electronically signed by Donato SchultzMark Skains MD Signature Date/Time: 04/19/2020/4:01:15 PM    Final    CT Angio Abd/Pel W and/or Wo Contrast  Result Date: 04/17/2020 CLINICAL DATA:  GI bleeding. EXAM: CTA ABDOMEN AND PELVIS WITHOUT AND WITH CONTRAST TECHNIQUE: Multidetector CT imaging of the abdomen and pelvis was performed using the standard protocol during bolus administration of intravenous contrast. Multiplanar reconstructed images and MIPs were obtained and reviewed to evaluate the vascular anatomy. CONTRAST:  100mL OMNIPAQUE IOHEXOL 350 MG/ML SOLN COMPARISON:  None. FINDINGS: VASCULAR Aorta: Normal caliber aorta without aneurysm, dissection, vasculitis or  significant stenosis. Atherosclerotic changes are noted throughout the abdominal aorta. Celiac: Patent without evidence of aneurysm, dissection, vasculitis or significant stenosis. SMA: Patent without evidence of aneurysm, dissection, vasculitis or significant stenosis. Renals: Both renal arteries are patent without evidence of aneurysm, dissection, vasculitis, fibromuscular dysplasia or significant stenosis. There are 2 right renal arteries. IMA: Patent without evidence of aneurysm, dissection, vasculitis or significant stenosis. Inflow: Patent without evidence of aneurysm, dissection, vasculitis or significant stenosis. Proximal Outflow: Bilateral common femoral and visualized portions of the superficial and profunda femoral arteries are patent without evidence of aneurysm, dissection, vasculitis or significant stenosis. Veins: No obvious venous abnormality within the  limitations of this arterial phase study. Review of the MIP images confirms the above findings. NON-VASCULAR Lower chest: There is a large left-sided pleural effusion that is only partially visualized. There is at least partial collapse of the left lower lobe.The heart size is normal. The intracardiac blood pool is hypodense relative to the adjacent myocardium consistent with anemia. Hepatobiliary: The liver is cirrhotic. There are large gastric varices with a gastro renal shunt. The portal vein is patent. There are esophageal varices. Normal gallbladder.There is no biliary ductal dilation. Pancreas: The pancreas is atrophic. Again noted is a low-attenuation, cystic appearing nodule at the pancreatic tail which is not substantially changed since 2015 is likely a benign process. Spleen: Unremarkable. Adrenals/Urinary Tract: --Adrenal glands: Unremarkable. --Right kidney/ureter: No hydronephrosis or radiopaque kidney stones. --Left kidney/ureter: No hydronephrosis or radiopaque kidney stones. --Urinary bladder: The urinary bladder is significantly distended. Stomach/Bowel: --Stomach/Duodenum: Large gastric varices are noted. --Small bowel: Unremarkable. --Colon: Rectosigmoid diverticulosis without acute inflammation. --Appendix: Normal. Lymphatic: --No retroperitoneal lymphadenopathy. --No mesenteric lymphadenopathy. --No pelvic or inguinal lymphadenopathy. Reproductive: There are large bilateral inguinal hernias. On the left the inguinal hernia contains a large amount of ascites and portions of the sigmoid colon without evidence for obstruction. On the right, the hernia contains mostly fluid but a small portion of small bowel as well without evidence for obstruction. Other: There is a moderate to large volume of ascites in the abdomen. Musculoskeletal. Multilevel degenerative changes are noted throughout the lumbar spine. IMPRESSION: 1. No evidence for active arterial GI bleeding. 2. Cirrhosis with stigmata of portal  hypertension including a moderate volume ascites and large gastric varices with a gastrorenal shunt. Small esophageal varices are also noted. The portal vein is patent. Splenic vein is patent. 3. Large partially visualized left-sided pleural effusion with collapse of the left lower lobe. 4. Very large bilateral inguinal hernias containing a large volume of ascites and bowel as detailed above. Aortic Atherosclerosis (ICD10-I70.0). Electronically Signed   By: Katherine Mantle M.D.   On: 04/17/2020 18:38    Labs:  CBC: Recent Labs    04/18/20 0615 04/18/20 1436 04/18/20 1951 04/19/20 0055 04/19/20 0606 04/20/20 0308  WBC 11.2* 10.6*  --   --  9.2 7.8  HGB 5.9* 7.2* 8.4* 8.6* 8.2* 8.1*  HCT 18.5* 21.4* 24.9* 24.4* 23.5* 24.2*  PLT 124* 131*  --   --  123* 109*    COAGS: Recent Labs    04/17/20 1714  INR 1.6*    BMP: Recent Labs    09/29/19 1218 04/17/20 1419 04/18/20 0515 04/19/20 0606 04/20/20 0308  NA 135 140 143 143 145  K 4.3 4.4 4.4 3.9 3.7  CL 104 106 111 112* 114*  CO2 17* 13* 19* 22 22  GLUCOSE 276* 243* 156* 137* 136*  BUN 26* 56* 64* 53* 45*  CALCIUM 9.5 8.7* 8.6*  8.3* 8.1*  CREATININE 1.43* 1.12 1.11 1.21 1.20  GFRNONAA 45* >60 >60 59* 60*  GFRAA 52*  --   --   --   --     LIVER FUNCTION TESTS: Recent Labs    04/17/20 1419 04/18/20 0515  BILITOT 1.1 0.8  AST 63* 60*  ALT 33 31  ALKPHOS 110 93  PROT 6.5 5.8*  ALBUMIN 2.5* 2.4*    TUMOR MARKERS: No results for input(s): AFPTM, CEA, CA199, CHROMGRNA in the last 8760 hours.  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    Echocardiography performed yesterday demonstrates good left and right ventricular function. His troponins are trending down and he has no chest pain. I briefly discussed  his clinical scenario with cardiology over the phone who are in agreement that it is safe to proceed with intervention at this time.  Typically, given the presence of ascites I would proceed with a TIPS and combined antegrade/retrograde balloon occluded transvenous obliteration of the gastric varices (BRTO alone treats the gastric varices but does not treat the underlying portal hypertension and can result in worsening ascites). However, a TIPS typically requires general anesthesia and also results in more significant changes in fluid dynamics and significantly increased venous return to the right heart. I feel that given his advanced age and recent demand ischemia this would be too high risk.  Therefore, we will proceed with BRTO only in an effort to obliterate his gastric varices and reduce his risk of recurrent bleeding. If he does develop symptomatic large volume ascites in the future, we can always consider an elective TIPS at that time once his heart has completely recovered. We may also be fortunate that his ascites stays manageable conservatively and he may not require a TIPS at all. Having said all of that, emergent TIPS is still on the table during the BRTO procedure if necessary due to intraprocedural rupture of the varices, or inability to isolate and treat the varices from the retrograde approach alone.  I have explained all of this to Mr. Weatherbee and he voiced his understanding and desire to proceed.  1.) To IR for planned balloon occluded transvenous retrograde obliteration (BRTO) of gastric varices with possible transjugular intrahepatic portosystemic shunt (TIPS) creation only if absolutely necessary.  Thank you for this interesting consult.  I greatly enjoyed meeting BALEY MCQUAIN and look forward to participating in their care.  A copy of this report was sent to the requesting provider on this date.  Electronically Signed: Sterling Big, MD 04/20/2020, 10:40 AM   I spent a  total of 40 Minutes  in face to face in clinical consultation, greater than 50% of which was counseling/coordinating care for large gastric varices complicated by sentinel bleeding event.

## 2020-04-20 NOTE — Progress Notes (Signed)
Family member brought medical PoA papers.  Copy made and placed in shadow chart.

## 2020-04-20 NOTE — Progress Notes (Signed)
Set up patient's meal tray.

## 2020-04-20 NOTE — Progress Notes (Signed)
PROGRESS NOTE    Elijah [name]  XNA:[id number] DOB: 1933/07/23 DOA: 04/18/2020 PCP: Jaclyn Shaggy, MD   Brief Narrative:  Elijah [name] is a 87 y.o. male with medical history significant for CVA in 2015, MSSA bacteremia, essential hypertension, hyperlipidemia, type 2 diabetes who initially presented to [facility] from home on 04/17/2020 due to 4 days of melena, weakness, fatigue, chest pain, and shortness of breath.  Work-up revealed symptomatic blood loss anemia with initial hemoglobin of 5.7K and NSTEMI.  Admitted to the ICU initially, received 3u PRBC to maintain hemoglobin.  Patient evaluated by GI, ICU, cardiology at [facility]. Currently on Protonix drip, received octreotide and Rocephin after CT angio abdomen and pelvis showed cirrhotic liver morphology with large gastric varices. EGD done on 04/18/2020 which showed very large gastric varix without any active bleeding but with evidence of recent bleeding.  Small nonbleeding superficial varices were also noted.  He also had diagnostic paracentesis, no evidence of SBP. Seen by cardiology felt that his uptrending troponins and EKG abnormalities were consistent with NSTEMI in the setting of acute blood loss anemia.  Recommended echocardiogram as well as beta-blockade when able to tolerate; nitro for symptomatic chest pain. GI recommended evaluation for TIPS or BRTO given high risk for rebleeding from the morphology of the size of the gastric varix and to transfer to [facility] for IR guided TIPS.  Echocardiogram without overt findings per IR will likely proceed with TIPS and to BRTO procedure later today pending schedule.  Assessment & Plan:  Newly diagnosed cirrhosis of the liver with large acutely bleeding gastric varices Seen by GI at [facility], transferred to [facility] for procedure given availability GI and IR following: Plan for TIPS with BRTO this afternoon given reassuring echo.  Acute blood loss anemia secondary to upper GI bleed post EGD on  04/18/2020. Presented initially to [facility] ED with complaints of melena 4 days, with hemoglobin of 5.7 on presentation.  Status post 3u PRBC transfusion since admission Hgb stabilized: Lab Results  Component Value Date   HGB 8.1 (L) 04/20/2020  EGD done on 04/18/2020 showed very large gastric varix without any active bleeding but with evidence of recent bleeding.  Small nonbleeding superficial varices were also noted.  GI at [facility] recommended transfer to [facility] for IR guided TIPS Continue Protonix drip and octreotide drip.  NSTEMI in the setting of marked acute blood loss anemia. Presented with hemoglobin of 5.7K, baseline hemoglobin 10. Troponin has peaked at greater than 3700 and downtrending appropriately. Remains asymptomatic, cardiology discussion for preoperative clearance, they concur with previous note by Dr. Juliann Pares at [facility], pending echocardiogram  Acute onset ascites from worsening cirrhosis IR following, likely require multiple paracentesis for symptom control during hospital stay  Type 2 diabetes controlled Lab Results  Component Value Date   HGBA1C 5.4 04/18/2020  Hold off home oral antiglycemic's Start insulin sliding scale and hypoglycemic protocol  Isolated elevated AST Continue to monitor Avoid hepatotoxic agents Hold off on statin for now  BPH Resume home Flomax Monitor urine output  Physical debility/Ambulatory dysfunction PT to assess Fall precautions.   DVT prophylaxis: SCDs; avoid chemical prophylaxis given above Code Status: DNR confirmed at admission Family Communication: None available  Status is: Inpatient  Dispo: The patient is from: Home              Anticipated d/c is to: To be determined              Anticipated d/c date is: >72h  Patient currently not medically stable for discharge  Consultants:   PCCM, IR, GI, cardiology  Procedures:   Upper endoscopy 04/18/2020  TIPS/BRTO pending  clearance  Antimicrobials:  Ceftriaxone  Subjective: No acute issues or events overnight, patient looking forward to procedure as he "wants to get on with this" otherwise denies chest pain shortness of breath nausea vomiting diarrhea constipation headache fevers or chills.  Objective: Vitals:   04/20/20 0656 04/20/20 0856 04/20/20 1141 04/20/20 1155  BP:  113/62 114/67 118/63  Pulse:  67 74 75  Resp:  16 17 13   Temp:  98 F (36.7 C)    TempSrc:  Oral    SpO2:  98% 96% 100%  Weight: 90 kg     Height: 6\' 3"  (1.905 m)       Intake/Output Summary (Last 24 hours) at 04/20/2020 1157 Last data filed at 04/20/2020 0340 Gross per 24 hour  Intake 896.23 ml  Output -  Net 896.23 ml   Filed Weights   04/20/20 0656  Weight: 90 kg    Examination:  General:  Pleasantly resting in bed, No acute distress. HEENT:  Normocephalic atraumatic.  Sclerae nonicteric, noninjected.  Extraocular movements intact bilaterally. Neck:  Without mass or deformity.  Trachea is midline. Lungs:  Clear to auscultate bilaterally without rhonchi, wheeze, or rales. Heart:  Regular rate and rhythm.  Without murmurs, rubs, or gallops. Abdomen:  Soft, nontender, distended.  Without guarding or rebound. Extremities: Without cyanosis, clubbing, edema, or obvious deformity. Vascular:  Dorsalis pedis and posterior tibial pulses palpable bilaterally. Skin:  Warm and dry, no erythema, no ulcerations.   Data Reviewed: I have personally reviewed following labs and imaging studies  CBC: Recent Labs  Lab 04/17/20 1419 04/18/20 0100 04/18/20 0615 04/18/20 1436 04/18/20 1951 04/19/20 0055 04/19/20 0606 04/20/20 0308  WBC 11.7*  --  11.2* 10.6*  --   --  9.2 7.8  NEUTROABS  --   --   --  8.9*  --   --   --   --   HGB 5.7*   < > 5.9* 7.2* 8.4* 8.6* 8.2* 8.1*  HCT 18.0*   < > 18.5* 21.4* 24.9* 24.4* 23.5* 24.2*  MCV 111.8*  --  104.5* 97.3  --   --  96.3 97.2  PLT 191  --  124* 131*  --   --  123* 109*   < > =  values in this interval not displayed.   Basic Metabolic Panel: Recent Labs  Lab 04/17/20 1419 04/18/20 0515 04/19/20 0606 04/20/20 0308  NA 140 143 143 145  K 4.4 4.4 3.9 3.7  CL 106 111 112* 114*  CO2 13* 19* 22 22  GLUCOSE 243* 156* 137* 136*  BUN 56* 64* 53* 45*  CREATININE 1.12 1.11 1.21 1.20  CALCIUM 8.7* 8.6* 8.3* 8.1*  MG  --  1.8  --   --   PHOS  --  3.9  --   --    GFR: Estimated Creatinine Clearance: 54.8 mL/min (by C-G formula based on SCr of 1.2 mg/dL). Liver Function Tests: Recent Labs  Lab 04/17/20 1419 04/18/20 0515  AST 63* 60*  ALT 33 31  ALKPHOS 110 93  BILITOT 1.1 0.8  PROT 6.5 5.8*  ALBUMIN 2.5* 2.4*   No results for input(s): LIPASE, AMYLASE in the last 168 hours. Recent Labs  Lab 04/18/20 0500  AMMONIA 65*   Coagulation Profile: Recent Labs  Lab 04/17/20 1714  INR 1.6*   Cardiac  Enzymes: No results for input(s): CKTOTAL, CKMB, CKMBINDEX, TROPONINI in the last 168 hours. BNP (last 3 results) No results for input(s): PROBNP in the last 8760 hours. HbA1C: Recent Labs    04/18/20 1436  HGBA1C 5.4   CBG: Recent Labs  Lab 04/18/20 0644 04/18/20 1303  GLUCAP 147* 136*   Lipid Profile: No results for input(s): CHOL, HDL, LDLCALC, TRIG, CHOLHDL, LDLDIRECT in the last 72 hours. Thyroid Function Tests: No results for input(s): TSH, T4TOTAL, FREET4, T3FREE, THYROIDAB in the last 72 hours. Anemia Panel: Recent Labs    04/18/20 1436  FERRITIN 46  TIBC 241*  IRON 26*   Sepsis Labs: Recent Labs  Lab 04/17/20 1827 04/17/20 1931 04/18/20 0515 04/18/20 1951 04/19/20 0606  PROCALCITON 0.24  --  0.29  --   --   LATICACIDVEN  --  9.3* 5.0* 2.3* 1.4    Recent Results (from the past 240 hour(s))  SARS Coronavirus 2 by RT PCR (hospital order, performed in [facility] hospital lab) Nasopharyngeal Nasopharyngeal Swab     Status: None   Collection Time: 04/17/20  5:14 PM   Specimen: Nasopharyngeal Swab  Result Value Ref Range  Status   SARS Coronavirus 2 NEGATIVE NEGATIVE Final    Comment: (NOTE) SARS-CoV-2 target nucleic acids are NOT DETECTED.  The SARS-CoV-2 RNA is generally detectable in upper and lower respiratory specimens during the acute phase of infection. The lowest concentration of SARS-CoV-2 viral copies this assay can detect is 250 copies / mL. A negative result does not preclude SARS-CoV-2 infection and should not be used as the sole basis for treatment or other patient management decisions.  A negative result may occur with improper specimen collection / handling, submission of specimen other than nasopharyngeal swab, presence of viral mutation(s) within the areas targeted by this assay, and inadequate number of viral copies (<250 copies / mL). A negative result must be combined with clinical observations, patient history, and epidemiological information.  Fact Sheet for Patients:   BoilerBrush.com.cy  Fact Sheet for Healthcare Providers: https://pope.com/  This test is not yet approved or  cleared by the [place] FDA and has been authorized for detection and/or diagnosis of SARS-CoV-2 by FDA under an Emergency Use Authorization (EUA).  This EUA will remain in effect (meaning this test can be used) for the duration of the COVID-19 declaration under Section 564(b)(1) of the Act, 21 U.S.C. section 360bbb-3(b)(1), unless the authorization is terminated or revoked sooner.  Performed at [facility], [address] [place], [place] [address]   Culture, blood (routine x 2)     Status: None (Preliminary result)   Collection Time: 04/17/20  6:27 PM   Specimen: BLOOD  Result Value Ref Range Status   Specimen Description BLOOD BLOOD LEFT FOREARM  Final   Special Requests   Final    BOTTLES DRAWN AEROBIC AND ANAEROBIC Blood Culture adequate volume   Culture   Final    NO GROWTH 3 DAYS Performed at [facility], [address], [place], [place] [address]    Report Status PENDING  Incomplete  Culture, blood (routine x 2)     Status: None (Preliminary result)   Collection Time: 04/17/20  6:31 PM   Specimen: BLOOD  Result Value Ref Range Status   Specimen Description BLOOD BLOOD RIGHT WRIST  Final   Special Requests   Final    BOTTLES DRAWN AEROBIC AND ANAEROBIC Blood Culture results may not be optimal due to an inadequate volume of  blood received in culture bottles   Culture   Final    NO GROWTH 3 DAYS Performed at [facility], [address] [place], [place] [address]    Report Status PENDING  Incomplete  Aerobic/Anaerobic Culture (surgical/deep wound)     Status: None (Preliminary result)   Collection Time: 04/18/20  3:33 PM   Specimen: PATH Cytology Peritoneal fluid  Result Value Ref Range Status   Specimen Description   Final    PERITONEAL Performed at [facility], [address] [place], [place] [address]    Special Requests   Final    PERITONEAL Performed at [facility], [address] [place], [place] [address]    Gram Stain   Final    FEW WBC PRESENT, PREDOMINANTLY MONONUCLEAR NO ORGANISMS SEEN Performed at [facility], [address] [place], [place] [address]    Culture PENDING  Incomplete   Report Status PENDING  Incomplete         Radiology Studies: US Paracentesis  Result Date: 04/18/2020 INDICATION: Patient with newly found cirrhosis, ascites. Request is made for diagnostic paracentesis. EXAM: ULTRASOUND GUIDED DIAGNOSTIC PARACENTESIS MEDICATIONS: 10 mL 1% lidocaine COMPLICATIONS: None immediate. PROCEDURE: Informed written consent was obtained from the patient after a discussion of the risks, benefits and alternatives to treatment. A timeout was performed prior to the initiation of the procedure. Initial ultrasound scanning demonstrates a small amount of ascites within the right lower abdominal quadrant. The right lower abdomen  was prepped and draped in the usual sterile fashion. 1% lidocaine was used for local anesthesia. Following this, a 19 gauge, 7-cm, Yueh catheter was introduced. An ultrasound image was saved for documentation purposes. The paracentesis was performed. The catheter was removed and a dressing was applied. The patient tolerated the procedure well without immediate post procedural complication. FINDINGS: A total of approximately 60 mL of pale, clear fluid was removed. Samples were sent to the laboratory as requested by the clinical team. IMPRESSION: Successful ultrasound-guided paracentesis yielding 60 mL of peritoneal fluid. Read by: Loyce DysKacie Matthews PA-C Electronically Signed   By: Acquanetta BellingFarhaan  Mir M.D.   On: 04/18/2020 15:35   ECHOCARDIOGRAM COMPLETE  Result Date: 04/19/2020    ECHOCARDIOGRAM REPORT   Patient Name:   Elijah [name] Date of Exam: 04/19/2020 Medical Rec #:  [id number]        Height:       75.0 in Accession #:    [id number]       Weight:       175.0 lb Date of Birth:  01/02/1934        BSA:          2.074 m Patient Age:    86 years         BP:           112/58 mmHg Patient Gender: M                HR:           78 bpm. Exam Location:  Inpatient Procedure: 2D Echo Indications:    elevated troponin  History:        Patient has prior history of Echocardiogram examinations, most                 recent 09/04/2013. Arrythmias:Atrial Fibrillation.  Sonographer:    Delcie RochLauren Pennington Referring Phys: [id number] Azucena FallenWILLIAM C Anneta Rounds  Sonographer Comments: Image acquisition challenging due to respiratory motion. IMPRESSIONS  1. Left ventricular ejection  fraction, by estimation, is 55 to 60%. The left ventricle has normal function. The left ventricle demonstrates regional wall motion abnormalities (see scoring diagram/findings for description). Left ventricular diastolic parameters are consistent with Grade I diastolic dysfunction (impaired relaxation).  2. Right ventricular systolic function is normal. The right  ventricular size is normal.  3. Left atrial size was mildly dilated.  4. Moderate pleural effusion in the left lateral region.  5. The mitral valve is degenerative. Moderate mitral valve regurgitation. No evidence of mitral stenosis.  6. The aortic valve is calcified. There is moderate calcification of the aortic valve. There is moderate thickening of the aortic valve. Aortic valve regurgitation is moderate. Moderate aortic valve stenosis. Aortic valve area, by VTI measures 1.17 cm.  Aortic valve mean gradient measures 16.0 mmHg. Aortic valve Vmax measures 3.03 m/s.  7. The inferior vena cava is normal in size with greater than 50% respiratory variability, suggesting right atrial pressure of 3 mmHg. FINDINGS  Left Ventricle: Left ventricular ejection fraction, by estimation, is 55 to 60%. The left ventricle has normal function. The left ventricle demonstrates regional wall motion abnormalities. The left ventricular internal cavity size was normal in size. There is no left ventricular hypertrophy. Left ventricular diastolic parameters are consistent with Grade I diastolic dysfunction (impaired relaxation).  LV Wall Scoring: The apex is akinetic. Right Ventricle: The right ventricular size is normal. No increase in right ventricular wall thickness. Right ventricular systolic function is normal. Left Atrium: Left atrial size was mildly dilated. Right Atrium: Right atrial size was normal in size. Pericardium: There is no evidence of pericardial effusion. Mitral Valve: The mitral valve is degenerative in appearance. There is mild thickening of the mitral valve leaflet(s). There is mild calcification of the mitral valve leaflet(s). Moderate mitral valve regurgitation. No evidence of mitral valve stenosis. Tricuspid Valve: The tricuspid valve is normal in structure. Tricuspid valve regurgitation is mild . No evidence of tricuspid stenosis. Aortic Valve: The aortic valve is calcified. There is moderate calcification of the  aortic valve. There is moderate thickening of the aortic valve. Aortic valve regurgitation is moderate. Moderate aortic stenosis is present. Aortic valve mean gradient measures 16.0 mmHg. Aortic valve peak gradient measures 36.8 mmHg. Aortic valve area, by VTI measures 1.17 cm. Pulmonic Valve: The pulmonic valve was normal in structure. Pulmonic valve regurgitation is not visualized. No evidence of pulmonic stenosis. Aorta: The aortic root is normal in size and structure. Venous: The inferior vena cava is normal in size with greater than 50% respiratory variability, suggesting right atrial pressure of 3 mmHg. IAS/Shunts: No atrial level shunt detected by color flow Doppler. Additional Comments: There is a moderate pleural effusion in the left lateral region.  LEFT VENTRICLE PLAX 2D LVIDd:         5.20 cm LVIDs:         4.10 cm LV PW:         1.00 cm LV IVS:        1.00 cm LVOT diam:     1.80 cm LV SV:         65 LV SV Index:   31 LVOT Area:     2.54 cm  RIGHT VENTRICLE             IVC RV S prime:     12.50 cm/s  IVC diam: 2.10 cm TAPSE (M-mode): 1.9 cm LEFT ATRIUM             Index  RIGHT ATRIUM           Index LA diam:        4.10 cm 1.98 cm/m  RA Area:     17.30 cm LA Vol (A2C):   74.9 ml 36.12 ml/m RA Volume:   41.80 ml  20.16 ml/m LA Vol (A4C):   69.0 ml 33.28 ml/m LA Biplane Vol: 72.7 ml 35.06 ml/m  AORTIC VALVE AV Area (Vmax):    1.01 cm AV Area (Vmean):   1.08 cm AV Area (VTI):     1.17 cm AV Vmax:           303.33 cm/s AV Vmean:          184.000 cm/s AV VTI:            0.554 m AV Peak Grad:      36.8 mmHg AV Mean Grad:      16.0 mmHg LVOT Vmax:         120.00 cm/s LVOT Vmean:        77.800 cm/s LVOT VTI:          0.255 m LVOT/AV VTI ratio: 0.46  AORTA Ao Root diam: 3.30 cm  SHUNTS Systemic VTI:  0.25 m Systemic Diam: 1.80 cm Donato Schultz MD Electronically signed by Donato Schultz MD Signature Date/Time: 04/19/2020/4:01:15 PM    Final    Scheduled Meds: . fentaNYL      . lidocaine      .  midazolam      . [START ON 04/22/2020] pantoprazole  40 mg Intravenous Q12H  . tamsulosin  0.4 mg Oral QPC breakfast   Continuous Infusions: . cefTRIAXone (ROCEPHIN)  IV 2 g (04/19/20 2217)  . octreotide  (SANDOSTATIN)    IV infusion 50 mcg/hr (04/20/20 1112)    LOS: 2 days   Time spent:  Azucena Fallen, DO [facility]  If 7PM-7AM, please contact night-coverage [email or web address]  04/20/2020, 11:57 AM

## 2020-04-20 NOTE — Progress Notes (Signed)
Pt returned from IR.  Right jugular dressing is dry, clean, and intact.  Pt comfortable and not in pain.  Vitals taken and all within normal range.  Pt will be on bedrest for 24hrs.  Clear liquid diet initiated.

## 2020-04-20 NOTE — Progress Notes (Signed)
Subjective: No acute events.    Objective: Vital signs in last 24 hours: Temp:  [97.9 F (36.6 C)-98.5 F (36.9 C)] 98 F (36.7 C) (01/30 0600) Pulse Rate:  [76-86] 76 (01/30 0600) Resp:  [15-20] 15 (01/30 0600) BP: (111-124)/(58-72) 111/62 (01/30 0600) SpO2:  [95 %-99 %] 96 % (01/30 0600) Weight:  [90 kg] 90 kg (01/30 0656) Last BM Date: 04/19/20  Intake/Output from previous day: 01/29 0701 - 01/30 0700 In: 896.2 [I.V.:796.2; IV Piggyback:100] Out: -  Intake/Output this shift: No intake/output data recorded.  General appearance: alert, no distress and very hard of hearing GI: soft, non-tender; bowel sounds normal; no masses,  no organomegaly  Lab Results: Recent Labs    04/18/20 1436 04/18/20 1951 04/19/20 0055 04/19/20 0606 04/20/20 0308  WBC 10.6*  --   --  9.2 7.8  HGB 7.2*   < > 8.6* 8.2* 8.1*  HCT 21.4*   < > 24.4* 23.5* 24.2*  PLT 131*  --   --  123* 109*   < > = values in this interval not displayed.   BMET Recent Labs    04/18/20 0515 04/19/20 0606 04/20/20 0308  NA 143 143 145  K 4.4 3.9 3.7  CL 111 112* 114*  CO2 19* 22 22  GLUCOSE 156* 137* 136*  BUN 64* 53* 45*  CREATININE 1.11 1.21 1.20  CALCIUM 8.6* 8.3* 8.1*   LFT Recent Labs    04/17/20 1419 04/18/20 0515  PROT 6.5 5.8*  ALBUMIN 2.5* 2.4*  AST 63* 60*  ALT 33 31  ALKPHOS 110 93  BILITOT 1.1 0.8  BILIDIR 0.3*  --   IBILI 0.8  --    PT/INR Recent Labs    04/17/20 1714  LABPROT 18.2*  INR 1.6*   Hepatitis Panel Recent Labs    04/18/20 1951  HEPBSAG NON REACTIVE  HCVAB NON REACTIVE  HEPAIGM NON REACTIVE  HEPBIGM NON REACTIVE   C-Diff No results for input(s): CDIFFTOX in the last 72 hours. Fecal Lactopherrin No results for input(s): FECLLACTOFRN in the last 72 hours.  Studies/Results: US Paracentesis  Result Date: 04/18/2020 INDICATION: Patient with newly found cirrhosis, ascites. Request is made for diagnostic paracentesis. EXAM: ULTRASOUND GUIDED DIAGNOSTIC  PARACENTESIS MEDICATIONS: 10 mL 1% lidocaine COMPLICATIONS: None immediate. PROCEDURE: Informed written consent was obtained from the patient after a discussion of the risks, benefits and alternatives to treatment. A timeout was performed prior to the initiation of the procedure. Initial ultrasound scanning demonstrates a small amount of ascites within the right lower abdominal quadrant. The right lower abdomen was prepped and draped in the usual sterile fashion. 1% lidocaine was used for local anesthesia. Following this, a 19 gauge, 7-cm, Yueh catheter was introduced. An ultrasound image was saved for documentation purposes. The paracentesis was performed. The catheter was removed and a dressing was applied. The patient tolerated the procedure well without immediate post procedural complication. FINDINGS: A total of approximately 60 mL of pale, clear fluid was removed. Samples were sent to the laboratory as requested by the clinical team. IMPRESSION: Successful ultrasound-guided paracentesis yielding 60 mL of peritoneal fluid. Read by: Loyce Dys PA-C Electronically Signed   By: Acquanetta Belling M.D.   On: 04/18/2020 15:35   ECHOCARDIOGRAM COMPLETE  Result Date: 04/19/2020    ECHOCARDIOGRAM REPORT   Patient Name:   Elijah [name] Date of Exam: 04/19/2020 Medical Rec #:  [id number]        Height:       75.0 in  Accession #:    [id number]       Weight:       175.0 lb Date of Birth:  [birth year]        BSA:          2.074 m Patient Age:    86 years         BP:           112/58 mmHg Patient Gender: M                HR:           78 bpm. Exam Location:  Inpatient Procedure: 2D Echo Indications:    elevated troponin  History:        Patient has prior history of Echocardiogram examinations, most                 recent 09/04/2013. Arrythmias:Atrial Fibrillation.  Sonographer:    Delcie Roch Referring Phys: [id number] Azucena Fallen  Sonographer Comments: Image acquisition challenging due to respiratory motion.  IMPRESSIONS  1. Left ventricular ejection fraction, by estimation, is 55 to 60%. The left ventricle has normal function. The left ventricle demonstrates regional wall motion abnormalities (see scoring diagram/findings for description). Left ventricular diastolic parameters are consistent with Grade I diastolic dysfunction (impaired relaxation).  2. Right ventricular systolic function is normal. The right ventricular size is normal.  3. Left atrial size was mildly dilated.  4. Moderate pleural effusion in the left lateral region.  5. The mitral valve is degenerative. Moderate mitral valve regurgitation. No evidence of mitral stenosis.  6. The aortic valve is calcified. There is moderate calcification of the aortic valve. There is moderate thickening of the aortic valve. Aortic valve regurgitation is moderate. Moderate aortic valve stenosis. Aortic valve area, by VTI measures 1.17 cm.  Aortic valve mean gradient measures 16.0 mmHg. Aortic valve Vmax measures 3.03 m/s.  7. The inferior vena cava is normal in size with greater than 50% respiratory variability, suggesting right atrial pressure of 3 mmHg. FINDINGS  Left Ventricle: Left ventricular ejection fraction, by estimation, is 55 to 60%. The left ventricle has normal function. The left ventricle demonstrates regional wall motion abnormalities. The left ventricular internal cavity size was normal in size. There is no left ventricular hypertrophy. Left ventricular diastolic parameters are consistent with Grade I diastolic dysfunction (impaired relaxation).  LV Wall Scoring: The apex is akinetic. Right Ventricle: The right ventricular size is normal. No increase in right ventricular wall thickness. Right ventricular systolic function is normal. Left Atrium: Left atrial size was mildly dilated. Right Atrium: Right atrial size was normal in size. Pericardium: There is no evidence of pericardial effusion. Mitral Valve: The mitral valve is degenerative in appearance.  There is mild thickening of the mitral valve leaflet(s). There is mild calcification of the mitral valve leaflet(s). Moderate mitral valve regurgitation. No evidence of mitral valve stenosis. Tricuspid Valve: The tricuspid valve is normal in structure. Tricuspid valve regurgitation is mild . No evidence of tricuspid stenosis. Aortic Valve: The aortic valve is calcified. There is moderate calcification of the aortic valve. There is moderate thickening of the aortic valve. Aortic valve regurgitation is moderate. Moderate aortic stenosis is present. Aortic valve mean gradient measures 16.0 mmHg. Aortic valve peak gradient measures 36.8 mmHg. Aortic valve area, by VTI measures 1.17 cm. Pulmonic Valve: The pulmonic valve was normal in structure. Pulmonic valve regurgitation is not visualized. No evidence of pulmonic stenosis. Aorta: The aortic root is normal in size  and structure. Venous: The inferior vena cava is normal in size with greater than 50% respiratory variability, suggesting right atrial pressure of 3 mmHg. IAS/Shunts: No atrial level shunt detected by color flow Doppler. Additional Comments: There is a moderate pleural effusion in the left lateral region.  LEFT VENTRICLE PLAX 2D LVIDd:         5.20 cm LVIDs:         4.10 cm LV PW:         1.00 cm LV IVS:        1.00 cm LVOT diam:     1.80 cm LV SV:         65 LV SV Index:   31 LVOT Area:     2.54 cm  RIGHT VENTRICLE             IVC RV S prime:     12.50 cm/s  IVC diam: 2.10 cm TAPSE (M-mode): 1.9 cm LEFT ATRIUM             Index       RIGHT ATRIUM           Index LA diam:        4.10 cm 1.98 cm/m  RA Area:     17.30 cm LA Vol (A2C):   74.9 ml 36.12 ml/m RA Volume:   41.80 ml  20.16 ml/m LA Vol (A4C):   69.0 ml 33.28 ml/m LA Biplane Vol: 72.7 ml 35.06 ml/m  AORTIC VALVE AV Area (Vmax):    1.01 cm AV Area (Vmean):   1.08 cm AV Area (VTI):     1.17 cm AV Vmax:           303.33 cm/s AV Vmean:          184.000 cm/s AV VTI:            0.554 m AV Peak  Grad:      36.8 mmHg AV Mean Grad:      16.0 mmHg LVOT Vmax:         120.00 cm/s LVOT Vmean:        77.800 cm/s LVOT VTI:          0.255 m LVOT/AV VTI ratio: 0.46  AORTA Ao Root diam: 3.30 cm  SHUNTS Systemic VTI:  0.25 m Systemic Diam: 1.80 cm Donato Schultz MD Electronically signed by Donato Schultz MD Signature Date/Time: 04/19/2020/4:01:15 PM    Final     Medications:  Scheduled: . [START ON 04/22/2020] pantoprazole  40 mg Intravenous Q12H  . tamsulosin  0.4 mg Oral QPC breakfast   Continuous: . cefTRIAXone (ROCEPHIN)  IV 2 g (04/19/20 2217)  . octreotide  (SANDOSTATIN)    IV infusion 50 mcg/hr (04/20/20 0025)    Assessment/Plan: 1) GOV2 bleed - stable. 2) Cirrhosis - ? Etiology.  It was confirmed with the patient that he never drank ETOH. 3) Anemia - stable.   The consultation to IR was placed yesterday.  Currently he is stable.  Plan: 1) Continue with octreotide. 2) Continue with ceftriaxone. 3) Follow HGB.  LOS: 2 days   Denaly Gatling D 04/20/2020, 8:39 AM

## 2020-04-20 NOTE — Procedures (Signed)
Interventional Radiology Procedure Note  Procedure: Successful coil-assisted balloon-occluded transvenous obliteration of massive gastric varices (CA-BRTO).  Draining inferior phrenic and accessory inferior phrenic veins also embolized.   Complications: None  Estimated Blood Loss: None  Recommendations: - Bedrest overnight - Clears are now ok - ADAT tomorrow if doing well - BRTO protocol CT abd/pelvis Tuesday  - F/U EDG in 6 weeks - F/U with IR in 3 months  Signed,  Sterling Big, MD

## 2020-04-21 ENCOUNTER — Encounter: Payer: Self-pay | Admitting: Gastroenterology

## 2020-04-21 ENCOUNTER — Inpatient Hospital Stay (HOSPITAL_COMMUNITY): Payer: Medicare Other

## 2020-04-21 DIAGNOSIS — I864 Gastric varices: Secondary | ICD-10-CM

## 2020-04-21 DIAGNOSIS — K922 Gastrointestinal hemorrhage, unspecified: Secondary | ICD-10-CM

## 2020-04-21 DIAGNOSIS — K7290 Hepatic failure, unspecified without coma: Secondary | ICD-10-CM

## 2020-04-21 DIAGNOSIS — D62 Acute posthemorrhagic anemia: Secondary | ICD-10-CM

## 2020-04-21 DIAGNOSIS — K7460 Unspecified cirrhosis of liver: Secondary | ICD-10-CM

## 2020-04-21 DIAGNOSIS — R188 Other ascites: Secondary | ICD-10-CM

## 2020-04-21 HISTORY — PX: IR PARACENTESIS: IMG2679

## 2020-04-21 LAB — PROTEIN, PLEURAL OR PERITONEAL FLUID: Total protein, fluid: <3 g/dL

## 2020-04-21 LAB — CBC
HCT: 25.4 % — ABNORMAL LOW (ref 39.0–52.0)
Hemoglobin: 8.4 g/dL — ABNORMAL LOW (ref 13.0–17.0)
MCH: 32.9 pg (ref 26.0–34.0)
MCHC: 33.1 g/dL (ref 30.0–36.0)
MCV: 99.6 fL (ref 80.0–100.0)
Platelets: 100 10*3/uL — ABNORMAL LOW (ref 150–400)
RBC: 2.55 MIL/uL — ABNORMAL LOW (ref 4.22–5.81)
RDW: 19.9 % — ABNORMAL HIGH (ref 11.5–15.5)
WBC: 8.9 10*3/uL (ref 4.0–10.5)
nRBC: 0.6 % — ABNORMAL HIGH (ref 0.0–0.2)

## 2020-04-21 LAB — BASIC METABOLIC PANEL
Anion gap: 10 (ref 5–15)
BUN: 35 mg/dL — ABNORMAL HIGH (ref 8–23)
CO2: 19 mmol/L — ABNORMAL LOW (ref 22–32)
Calcium: 7.9 mg/dL — ABNORMAL LOW (ref 8.9–10.3)
Chloride: 112 mmol/L — ABNORMAL HIGH (ref 98–111)
Creatinine, Ser: 1.06 mg/dL (ref 0.61–1.24)
GFR, Estimated: >60 mL/min (ref >60)
Glucose, Bld: 163 mg/dL — ABNORMAL HIGH (ref 70–99)
Potassium: 3.7 mmol/L (ref 3.5–5.1)
Sodium: 141 mmol/L (ref 135–145)

## 2020-04-21 LAB — PATHOLOGIST SMEAR REVIEW

## 2020-04-21 LAB — ALBUMIN, PLEURAL OR PERITONEAL FLUID: Albumin, Fluid: <1 g/dL

## 2020-04-21 MED ORDER — PANTOPRAZOLE SODIUM 40 MG IV SOLR
40.0000 mg | Freq: Two times a day (BID) | INTRAVENOUS | Status: DC
  Administered 2020-04-21 – 2020-04-22 (×3): 40 mg via INTRAVENOUS
  Filled 2020-04-21 (×3): qty 40

## 2020-04-21 MED ORDER — LIDOCAINE HCL 1 % IJ SOLN
INTRAMUSCULAR | Status: AC
  Filled 2020-04-21: qty 20

## 2020-04-21 MED ORDER — LIDOCAINE HCL (PF) 1 % IJ SOLN
INTRAMUSCULAR | Status: AC | PRN
  Administered 2020-04-21: 10 mL

## 2020-04-21 MED ORDER — ALBUMIN HUMAN 25 % IV SOLN
25.0000 g | Freq: Four times a day (QID) | INTRAVENOUS | Status: AC
  Administered 2020-04-21 – 2020-04-22 (×2): 25 g via INTRAVENOUS
  Filled 2020-04-21 (×3): qty 100

## 2020-04-21 NOTE — Procedures (Signed)
PROCEDURE SUMMARY:  Successful US guided paracentesis from right abdomen.  Yielded 3.3 L of clear yellow fluid.  No immediate complications.  Pt tolerated well.   Specimen sent for labs.  EBL < 2 mL  Mickie Kay, NP 04/21/2020 2:07 PM

## 2020-04-21 NOTE — Progress Notes (Signed)
Referring Physician(s): Dr. Natale [name]  Supervising Physician: Elijah [name]  Patient Status:  [facility] - In-pt  Chief Complaint: Bleeding gastric varices; S/p coil-assisted balloon-occluded transvenous obliteration of massive gastric varices (CA-BRTO). Draining inferior phrenic and accessory inferior phrenic veins also embolized.   Subjective: Patient was evaluated while in IR for paracentesis. Patient denies pain or discomfort. Right IJ access site is covered with a pressure dressing and is clean/dry. Abdomen is distended.   Allergies: Patient has no known allergies.  Medications: Prior to Admission medications   Medication Sig Start Date End Date Taking? Authorizing Provider  atorvastatin (LIPITOR) 40 MG tablet Take 40 mg by mouth daily. 07/30/13   [provider]  cefTRIAXone 2 g in sodium chloride 0.9 % 100 mL Inject 2 g into the vein daily. 04/18/20   Schertz, Shari Heritage, MD  glimepiride (AMARYL) 4 MG tablet Take 4 mg by mouth daily. 08/17/13   [provider]  metFORMIN (GLUCOPHAGE) 500 MG tablet Take 500 mg by mouth 2 (two) times daily with a meal. 08/08/13   [provider]  octreotide 500 mcg in sodium chloride 0.9 % 250 mL Inject 50 mcg/hr into the vein continuous. 04/18/20   Schertz, Shari Heritage, MD  pantoprazole (PROTONIX) 40 MG injection Inject 40 mg into the vein every 12 (twelve) hours. 04/21/20   Schertz, Shari Heritage, MD  pioglitazone (ACTOS) 30 MG tablet Take 30 mg by mouth daily. 07/30/13   [provider]  tamsulosin (FLOMAX) 0.4 MG CAPS capsule Take 0.4 mg by mouth.    [provider]  insulin detemir (LEVEMIR) 100 UNIT/ML injection Inject 20 Units into the skin at bedtime.  11/11/18  [provider]  metoprolol tartrate (LOPRESSOR) 25 MG tablet Take 25 mg by mouth 3 (three) times daily.   11/11/18  [provider]     Vital Signs: BP 117/70 (BP Location: Left Arm)   Pulse 78   Temp 97.6 F (36.4 C) (Oral)   Resp  17   Ht 6\' 3"  (1.905 m)   Wt 205 lb 14.6 oz (93.4 kg)   SpO2 98%   BMI 25.74 kg/m   Physical Exam Constitutional:      General: He is not in acute distress. Cardiovascular:     Comments: Right IJ access site is covered with a pressure dressing; dressing is clean and dry.  Pulmonary:     Effort: Pulmonary effort is normal.  Abdominal:     General: There is distension.     Comments: 3.3 L of clear yellow fluid removed during paracentesis.   Skin:    General: Skin is warm and dry.  Neurological:     Mental Status: He is alert and oriented to person, place, and time.     Imaging: DG Chest 2 View  Result Date: 04/17/2020 CLINICAL DATA:  Patient with weakness. EXAM: CHEST - 2 VIEW COMPARISON:  Chest radiograph 09/11/2013. FINDINGS: Stable cardiomegaly. Large layering left pleural effusion with underlying consolidation. No pneumothorax. Thoracic spine degenerative changes. IMPRESSION: Large layering left pleural effusion with underlying consolidation. Electronically Signed   By: 09/13/2013 M.D.   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 He presents today for balloon occluded transvenous obliteration of gastric varices. EXAM: 1. Ultrasound-guided access right  internal jugular vein 2. Catheterization and venogram, left renal vein 3. Catheterization and venogram of gastro renal shunt 4. Catheterization and venogram of left inferior phrenic vein 5. Coil embolization of left inferior phrenic vein 6. Catheterization of venogram of accessory left inferior phrenic vein 7. Coil embolization of accessory left inferior phrenic vein 8. Embolization of gastric varices and gastro renal shunt MEDICATIONS: None. ANESTHESIA/SEDATION: 2  mg Versed and 100 mcg fentanyl were administered intravenously. Moderate Sedation Time:  64 minutes The patient was continuously monitored during the procedure by the interventional radiology nurse under my direct supervision. CONTRAST:  120 mL Omnipaque 350 FLUOROSCOPY TIME:  Fluoroscopy Time: 40 minutes 30 seconds (1472 mGy). COMPLICATIONS: None immediate. PROCEDURE: Informed written consent was obtained from the patient after a thorough discussion of the procedural risks, benefits and alternatives. All questions were addressed. Maximal Sterile Barrier Technique was utilized including caps, mask, sterile gowns, sterile gloves, sterile drape, hand hygiene and skin antiseptic. A timeout was performed prior to the initiation of the procedure. The right internal jugular vein was interrogated with ultrasound and found to be widely patent. An image was obtained and stored for the medical record. Local anesthesia was attained by infiltration with 1% lidocaine. A small dermatotomy was made. Under real-time sonographic guidance, the vessel was punctured with a 21 gauge micropuncture needle. Using standard technique, the initial micro needle was exchanged over a 0.018 micro wire for a transitional 4 [place] micro sheath. The micro sheath was then exchanged over a 0.035 wire for a 10 French fascial dilator and the soft tissue tract was dilated. Next, a 10 French tips sheath was carefully advanced over the wire and into the inferior vena cava. An angled 5 French catheter was then advanced over the wire. The catheter was used to select the left renal vein. A left renal venogram was performed. Standard venous anatomy. No evidence of left renal venous thrombus. A rose in wire was parked in an inferior branch of the left renal vein. The tips sheath was then advanced into the origin of the renal vein. Additional left renal venography was performed. The inflow of the gastro renal shunt was successfully identified along the superior  margin of the left renal vein. The angled catheter was introduced through the tips sheath coaxially adjacent to the stabilizing Rosen wire. Utilizing a Glidewire, the catheter was successfully advanced into proximal left gastro renal shunt. Venography was again performed. The renal shunt is robust. The expected web near the origin was identified. Flow direction is toward the renal vein. The Glidewire and catheter were advanced deeper into the gastro renal shunt. The glidewire was exchanged for a superstiff Amplatz wire. The 5 French catheter was removed. Next, attempts were made to advance several occlusion balloons into the gastro renal shunt. Ultimately, a 449 French Merci balloon occlusion catheter was successfully advanced into the gastro renal shunt. The occlusion balloon was inflated and pulled snug against the web near the gastro renal shunt origin. Balloon occluded venography was then performed. The gastro renal shunt is quite massive. At least 2 large draining veins are identified escaping the gastro renal shunt and draining into the systemic system. These appear to represent the inferior phrenic vein and a second accessory inferior phrenic vein. Coil embolization of these non bleeding vessels will likely be required in order to obtain stasis within the recently hemorrhaged gastric varices. A penumbra lantern microcatheter was advanced coaxially through the balloon occlusion catheter over a 0.016 fathom wire. The catheter was successfully used  to select the inferior phrenic vein. The catheter was advanced into the inferior phrenic vein and venography was performed confirming catheter location. The inferior phrenic vein does drain into the systemic symptom and provide an outlet from the gastro renal shunt. Coil embolization was then performed using a series of 3 and 4 mm soft detachable Ruby coils. The microcatheter was brought back into the main body of the gastro renal shunt. The catheter was carefully  advanced deeper into the gastro renal shunt and then used to successfully catheterize the next major escape vessel. The catheter was advanced into the vessel and venography performed. This appears to be an accessory inferior phrenic vein which drains into the azygos and hemi azygous system. This vessel must be sacrificed in order to proceed with obliteration of the target gastric varices. Therefore, coil embolization was performed utilizing a combination of 3, 4 and 5 mm Ruby detachable coils. The microcatheter was then brought back into the gastro renal shunt. Additional venography was performed, this time with carbon dioxide gas. The carbon dioxide gas successfully opacifies the entirety of the gastric variceal network. The afferent vein from the portal venous system appears to be the posterior gastric vein. Sclerosis and mixture was then prepared in the standard fashion (3: 2 : 1 mixture of air : 3% sodium tetradecyl Sulfate : Lipiodol). The sclerosis mixture was then instilled in aliquots through the lantern microcatheter filling the gastro renal shunt and ultimately the gastric varices. Injection was stopped when sclerosis and began to spill into the posterior gastric vein. Approximately 10 mL 3% sodium tetradecyl Sulfate and 4 mL Lipiodol were administered. The microcatheter was then brought back into the proximal gastro renal shunt just beyond the occlusion balloon. Coil embolization of the proximal gastro renal shunt was then performed utilizing numerous 18, 16, 14 mm detachable Ruby microcoils as well as multiple liquid metal packing coils. Once a dense metal plug was successfully created, the occlusion balloon was slowly deflated under real-time fluoroscopic imaging. The sclerosing mass and coil pack were stable. No evidence of mobilization or flow. The microcatheter was removed. The occlusion balloon was brought back into the left renal vein. Left renal venography was performed confirming persistent  patency of the renal vein. No evidence of complication. The sheath and occlusion balloon were removed. Hemostasis was attained with the assistance of manual pressure. IMPRESSION: 1. Successful coil embolization of non bleeding accessory portosystemic collaterals (inferior phrenic an accessory inferior phrenic veins) which was required to successfully treated the bleeding gastric varices. 2. Successful coil assisted balloon occluded transvenous obliteration (CA-BRTO) of gastric varices using liquid sclerosant and detachable coils. PLAN: 1. Maintain bed rest overnight. Clear liquids are okay. Advanced diet as tolerated beginning tomorrow if patient remains stable. 2. BRTO protocol CT scan of the abdomen on Tuesday. 3. Patient will require outpatient follow-up EGD in approximately 6 weeks to assess for new or progressive esophageal varices and to assess the treated gastric varices. 4. Follow-up with Interventional Radiology in 3 months. Signed, Sterling Big, MD, RPVI [facility] Radiology Electronically Signed   By: Malachy Moan M.D.   On: 04/20/2020 15:16    US Paracentesis  Result Date: 04/18/2020 INDICATION: Patient with newly found cirrhosis, ascites. Request is made for diagnostic paracentesis. EXAM: ULTRASOUND GUIDED DIAGNOSTIC PARACENTESIS MEDICATIONS: 10 mL 1% lidocaine COMPLICATIONS: None immediate. PROCEDURE: Informed written consent was obtained from the patient after a discussion of the risks, benefits and alternatives to treatment. A timeout was performed prior  to the initiation of the procedure. Initial ultrasound scanning demonstrates a small amount of ascites within the right lower abdominal quadrant. The right lower abdomen was prepped and draped in the usual sterile fashion. 1% lidocaine was used for local anesthesia. Following this, a 19 gauge, 7-cm, Yueh catheter was introduced. An ultrasound image was saved for documentation  purposes. The paracentesis was performed. The catheter was removed and a dressing was applied. The patient tolerated the procedure well without immediate post procedural complication. FINDINGS: A total of approximately 60 mL of pale, clear fluid was removed. Samples were sent to the laboratory as requested by the clinical team. IMPRESSION: Successful ultrasound-guided paracentesis yielding 60 mL of peritoneal fluid. Read by: Loyce Dys PA-C Electronically Signed   By: Acquanetta Belling M.D.   On: 04/18/2020 15:35   ECHOCARDIOGRAM COMPLETE  Result Date: 04/19/2020    ECHOCARDIOGRAM REPORT   Patient Name:   Elijah [name] Date of Exam: 04/19/2020 Medical Rec #:  [id number]        Height:       75.0 in Accession #:    [id number]       Weight:       175.0 lb Date of Birth:  09-Mar-1934        BSA:          2.074 m Patient Age:    86 years         BP:           112/58 mmHg Patient Gender: M                HR:           78 bpm. Exam Location:  Inpatient Procedure: 2D Echo Indications:    elevated troponin  History:        Patient has prior history of Echocardiogram examinations, most                 recent 09/04/2013. Arrythmias:Atrial Fibrillation.  Sonographer:    Delcie Roch Referring Phys: [id number] Azucena Fallen  Sonographer Comments: Image acquisition challenging due to respiratory motion. IMPRESSIONS  1. Left ventricular ejection fraction, by estimation, is 55 to 60%. The left ventricle has normal function. The left ventricle demonstrates regional wall motion abnormalities (see scoring diagram/findings for description). Left ventricular diastolic parameters are consistent with Grade I diastolic dysfunction (impaired relaxation).  2. Right ventricular systolic function is normal. The right ventricular size is normal.  3. Left atrial size was mildly dilated.  4. Moderate pleural effusion in the left lateral region.  5. The mitral valve is degenerative. Moderate mitral valve regurgitation. No evidence of  mitral stenosis.  6. The aortic valve is calcified. There is moderate calcification of the aortic valve. There is moderate thickening of the aortic valve. Aortic valve regurgitation is moderate. Moderate aortic valve stenosis. Aortic valve area, by VTI measures 1.17 cm.  Aortic valve mean gradient measures 16.0 mmHg. Aortic valve Vmax measures 3.03 m/s.  7. The inferior vena cava is normal in size with greater than 50% respiratory variability, suggesting right atrial pressure of 3 mmHg. FINDINGS  Left Ventricle: Left ventricular ejection fraction, by estimation, is 55 to 60%. The left ventricle has normal function. The left ventricle demonstrates regional wall motion abnormalities. The left ventricular internal cavity size was normal in size. There is no left ventricular hypertrophy. Left ventricular diastolic parameters are consistent with Grade I diastolic dysfunction (impaired relaxation).  LV Wall Scoring: The apex is akinetic. Right Ventricle:  The right ventricular size is normal. No increase in right ventricular wall thickness. Right ventricular systolic function is normal. Left Atrium: Left atrial size was mildly dilated. Right Atrium: Right atrial size was normal in size. Pericardium: There is no evidence of pericardial effusion. Mitral Valve: The mitral valve is degenerative in appearance. There is mild thickening of the mitral valve leaflet(s). There is mild calcification of the mitral valve leaflet(s). Moderate mitral valve regurgitation. No evidence of mitral valve stenosis. Tricuspid Valve: The tricuspid valve is normal in structure. Tricuspid valve regurgitation is mild . No evidence of tricuspid stenosis. Aortic Valve: The aortic valve is calcified. There is moderate calcification of the aortic valve. There is moderate thickening of the aortic valve. Aortic valve regurgitation is moderate. Moderate aortic stenosis is present. Aortic valve mean gradient measures 16.0 mmHg. Aortic valve peak gradient  measures 36.8 mmHg. Aortic valve area, by VTI measures 1.17 cm. Pulmonic Valve: The pulmonic valve was normal in structure. Pulmonic valve regurgitation is not visualized. No evidence of pulmonic stenosis. Aorta: The aortic root is normal in size and structure. Venous: The inferior vena cava is normal in size with greater than 50% respiratory variability, suggesting right atrial pressure of 3 mmHg. IAS/Shunts: No atrial level shunt detected by color flow Doppler. Additional Comments: There is a moderate pleural effusion in the left lateral region.  LEFT VENTRICLE PLAX 2D LVIDd:         5.20 cm LVIDs:         4.10 cm LV PW:         1.00 cm LV IVS:        1.00 cm LVOT diam:     1.80 cm LV SV:         65 LV SV Index:   31 LVOT Area:     2.54 cm  RIGHT VENTRICLE             IVC RV S prime:     12.50 cm/s  IVC diam: 2.10 cm TAPSE (M-mode): 1.9 cm LEFT ATRIUM             Index       RIGHT ATRIUM           Index LA diam:        4.10 cm 1.98 cm/m  RA Area:     17.30 cm LA Vol (A2C):   74.9 ml 36.12 ml/m RA Volume:   41.80 ml  20.16 ml/m LA Vol (A4C):   69.0 ml 33.28 ml/m LA Biplane Vol: 72.7 ml 35.06 ml/m  AORTIC VALVE AV Area (Vmax):    1.01 cm AV Area (Vmean):   1.08 cm AV Area (VTI):     1.17 cm AV Vmax:           303.33 cm/s AV Vmean:          184.000 cm/s AV VTI:            0.554 m AV Peak Grad:      36.8 mmHg AV Mean Grad:      16.0 mmHg LVOT Vmax:         120.00 cm/s LVOT Vmean:        77.800 cm/s LVOT VTI:          0.255 m LVOT/AV VTI ratio: 0.46  AORTA Ao Root diam: 3.30 cm  SHUNTS Systemic VTI:  0.25 m Systemic Diam: 1.80 cm Donato Schultz MD Electronically signed by Donato Schultz MD Signature Date/Time: 04/19/2020/4:01:15 PM  Final    CT Angio Abd/Pel W and/or Wo Contrast  Result Date: 04/17/2020 CLINICAL DATA:  GI bleeding. EXAM: CTA ABDOMEN AND PELVIS WITHOUT AND WITH CONTRAST TECHNIQUE: Multidetector CT imaging of the abdomen and pelvis was performed using the standard protocol during bolus  administration of intravenous contrast. Multiplanar reconstructed images and MIPs were obtained and reviewed to evaluate the vascular anatomy. CONTRAST:  OMNIPAQUE IOHEXOL 350 MG/ML SOLN COMPARISON:  None. FINDINGS: VASCULAR Aorta: Normal caliber aorta without aneurysm, dissection, vasculitis or significant stenosis. Atherosclerotic changes are noted throughout the abdominal aorta. Celiac: Patent without evidence of aneurysm, dissection, vasculitis or significant stenosis. SMA: Patent without evidence of aneurysm, dissection, vasculitis or significant stenosis. Renals: Both renal arteries are patent without evidence of aneurysm, dissection, vasculitis, fibromuscular dysplasia or significant stenosis. There are 2 right renal arteries. IMA: Patent without evidence of aneurysm, dissection, vasculitis or significant stenosis. Inflow: Patent without evidence of aneurysm, dissection, vasculitis or significant stenosis. Proximal Outflow: Bilateral common femoral and visualized portions of the superficial and profunda femoral arteries are patent without evidence of aneurysm, dissection, vasculitis or significant stenosis. Veins: No obvious venous abnormality within the limitations of this arterial phase study. Review of the MIP images confirms the above findings. NON-VASCULAR Lower chest: There is a large left-sided pleural effusion that is only partially visualized. There is at least partial collapse of the left lower lobe.The heart size is normal. The intracardiac blood pool is hypodense relative to the adjacent myocardium consistent with anemia. Hepatobiliary: The liver is cirrhotic. There are large gastric varices with a gastro renal shunt. The portal vein is patent. There are esophageal varices. Normal gallbladder.There is no biliary ductal dilation. Pancreas: The pancreas is atrophic. Again noted is a low-attenuation, cystic appearing nodule at the pancreatic tail which is not substantially changed since 2015 is  likely a benign process. Spleen: Unremarkable. Adrenals/Urinary Tract: --Adrenal glands: Unremarkable. --Right kidney/ureter: No hydronephrosis or radiopaque kidney stones. --Left kidney/ureter: No hydronephrosis or radiopaque kidney stones. --Urinary bladder: The urinary bladder is significantly distended. Stomach/Bowel: --Stomach/Duodenum: Large gastric varices are noted. --Small bowel: Unremarkable. --Colon: Rectosigmoid diverticulosis without acute inflammation. --Appendix: Normal. Lymphatic: --No retroperitoneal lymphadenopathy. --No mesenteric lymphadenopathy. --No pelvic or inguinal lymphadenopathy. Reproductive: There are large bilateral inguinal hernias. On the left the inguinal hernia contains a large amount of ascites and portions of the sigmoid colon without evidence for obstruction. On the right, the hernia contains mostly fluid but a small portion of small bowel as well without evidence for obstruction. Other: There is a moderate to large volume of ascites in the abdomen. Musculoskeletal. Multilevel degenerative changes are noted throughout the lumbar spine. IMPRESSION: 1. No evidence for active arterial GI bleeding. 2. Cirrhosis with stigmata of portal hypertension including a moderate volume ascites and large gastric varices with a gastrorenal shunt. Small esophageal varices are also noted. The portal vein is patent. Splenic vein is patent. 3. Large partially visualized left-sided pleural effusion with collapse of the left lower lobe. 4. Very large bilateral inguinal hernias containing a large volume of ascites and bowel as detailed above. Aortic Atherosclerosis (ICD10-I70.0). Electronically Signed   By: Katherine Mantle M.D.   On: 04/17/2020 18:38    Labs:  CBC: Recent Labs    04/18/20 1436 04/18/20 1951 04/19/20 0055 04/19/20 0606 04/20/20 0308 04/21/20 0136  WBC 10.6*  --   --  9.2 7.8 8.9  HGB 7.2*   < > 8.6* 8.2* 8.1* 8.4*  HCT 21.4*   < > 24.4* 23.5* 24.2* 25.4*  PLT 131*  --    --  123* 109* 100*   < > = values in this interval not displayed.    COAGS: Recent Labs    04/17/20 1714  INR 1.6*    BMP: Recent Labs    09/29/19 1218 04/17/20 1419 04/18/20 0515 04/19/20 0606 04/20/20 0308 04/21/20 0136  NA 135   < > 143 143 145 141  K 4.3   < > 4.4 3.9 3.7 3.7  CL 104   < > 111 112* 114* 112*  CO2 17*   < > 19* 22 22 19*  GLUCOSE 276*   < > 156* 137* 136* 163*  BUN 26*   < > 64* 53* 45* 35*  CALCIUM 9.5   < > 8.6* 8.3* 8.1* 7.9*  CREATININE 1.43*   < > 1.11 1.21 1.20 1.06  GFRNONAA 45*   < > >60 59* 60* >60  GFRAA 52*  --   --   --   --   --    < > = values in this interval not displayed.    LIVER FUNCTION TESTS: Recent Labs    04/17/20 1419 04/18/20 0515  BILITOT 1.1 0.8  AST 63* 60*  ALT 33 31  ALKPHOS 110 93  PROT 6.5 5.8*  ALBUMIN 2.5* 2.4*    Assessment and Plan:   Bleeding gastric varices; S/p coil-assisted balloon-occluded transvenous obliteration of massive gastric varices (CA-BRTO). Draining inferior phrenic and accessory inferior phrenic veins also embolized.   Patient will need CT Abdomen/Pelvis 04/22/20 per BRTO protocol; will also need follow up EGD in 6 weeks. IR will follow up with patient in 3 months as an outpatient and this order has been placed.   RIJ site is clean and dry; 3.3 liters removed during paracentesis procedure today. Patient is afebrile; WBC is within range and hemoglobin is 8.4. Ok to advance diet as tolerated, ok to remove Right IJ dressing.   Other plans per primary teams. Please call IR with any questions.   Electronically Signed: Alwyn Ren, AGACNP-BC [phone] 04/21/2020, 2:09 PM   I spent a total of 15 Minutes at the the patient's bedside AND on the patient's hospital floor or unit, greater than 50% of which was counseling/coordinating care for follow up care s/p BRTO.

## 2020-04-21 NOTE — Progress Notes (Signed)
Daily Rounding Note  04/21/2020, 10:25 AM  LOS: 3 days   SUBJECTIVE:   Chief complaint:  Melena.  S/p BRTO of gastric varices w stigmata of bleeding Pt feels ok.  No recall of any BM's.    OBJECTIVE:         Vital signs in last 24 hours:    Temp:  [97.5 F (36.4 C)-98.3 F (36.8 C)] 97.7 F (36.5 C) (01/31 0820) Pulse Rate:  [71-87] 80 (01/31 0820) Resp:  [13-20] 15 (01/31 0820) BP: (112-152)/(60-76) 118/70 (01/31 0820) SpO2:  [93 %-100 %] 97 % (01/31 0820) Weight:  [93.4 kg] 93.4 kg (01/31 0500) Last BM Date: 04/19/20 Filed Weights   04/20/20 0656 04/21/20 0500  Weight: 90 kg 93.4 kg   General: Pleasant, aged, chronically and acutely ill-appearing Heart: RRR with systolic murmur.  S1, S2 present. Chest: Audible wheezing without stethoscope.  Mildly labored breathing.  Cough present Abdomen: Distended, tense.  No tenderness.  Active bowel sounds. Massive, bilateral inguinal hernias. Extremities: Pitting lower extremity edema. Neuro/Psych: Oriented x3.  Hard of hearing.  Follows all commands.  No asterixis.  Intake/Output from previous day: No intake/output data recorded.  Intake/Output this shift: No intake/output data recorded.  Lab Results: Recent Labs    04/19/20 0606 04/20/20 0308 04/21/20 0136  WBC 9.2 7.8 8.9  HGB 8.2* 8.1* 8.4*  HCT 23.5* 24.2* 25.4*  PLT 123* 109* 100*   BMET Recent Labs    04/19/20 0606 04/20/20 0308 04/21/20 0136  NA 143 145 141  K 3.9 3.7 3.7  CL 112* 114* 112*  CO2 22 22 19*  GLUCOSE 137* 136* 163*  BUN 53* 45* 35*  CREATININE 1.21 1.20 1.06  CALCIUM 8.3* 8.1* 7.9*   LFT No results for input(s): PROT, ALBUMIN, AST, ALT, ALKPHOS, BILITOT, BILIDIR, IBILI in the last 72 hours. PT/INR No results for input(s): LABPROT, INR in the last 72 hours. 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 He presents today for balloon occluded transvenous obliteration of gastric varices. EXAM: 1. Ultrasound-guided access right internal jugular vein 2. Catheterization and venogram, left renal vein 3. Catheterization and venogram of gastro renal shunt 4. Catheterization and venogram of left inferior phrenic vein 5. Coil embolization of left inferior phrenic vein 6. Catheterization of venogram of accessory left inferior phrenic vein 7. Coil embolization of accessory left inferior phrenic vein 8. Embolization of gastric varices and gastro renal shunt MEDICATIONS: None. ANESTHESIA/SEDATION: 2 mg Versed and 100 mcg fentanyl were administered intravenously. Moderate Sedation Time:  64 minutes The patient was continuously monitored during the procedure by the interventional radiology nurse under my direct supervision. CONTRAST:  120 mL Omnipaque 350 FLUOROSCOPY TIME:  Fluoroscopy Time: 40 minutes 30 seconds (1472 mGy). COMPLICATIONS: None immediate. PROCEDURE: Informed written consent was obtained from the patient after a thorough discussion of the procedural risks, benefits and alternatives. All questions were addressed. Maximal Sterile Barrier Technique was utilized  including caps, mask, sterile gowns, sterile gloves, sterile drape, hand hygiene and skin antiseptic. A timeout was performed prior to the initiation of the procedure. The right internal jugular vein was interrogated with ultrasound and found to be widely patent. An image was obtained and stored  for the medical record. Local anesthesia was attained by infiltration with 1% lidocaine. A small dermatotomy was made. Under real-time sonographic guidance, the vessel was punctured with a 21 gauge micropuncture needle. Using standard technique, the initial micro needle was exchanged over a 0.018 micro wire for a transitional 4 [place] micro sheath. The micro sheath was then exchanged over a 0.035 wire for a 10 French fascial dilator and the soft tissue tract was dilated. Next, a 87 French tips sheath was carefully advanced over the wire and into the inferior vena cava. An angled 5 French catheter was then advanced over the wire. The catheter was used to select the left renal vein. A left renal venogram was performed. Standard venous anatomy. No evidence of left renal venous thrombus. A rose in wire was parked in an inferior branch of the left renal vein. The tips sheath was then advanced into the origin of the renal vein. Additional left renal venography was performed. The inflow of the gastro renal shunt was successfully identified along the superior margin of the left renal vein. The angled catheter was introduced through the tips sheath coaxially adjacent to the stabilizing Rosen wire. Utilizing a Glidewire, the catheter was successfully advanced into proximal left gastro renal shunt. Venography was again performed. The renal shunt is robust. The expected web near the origin was identified. Flow direction is toward the renal vein. The Glidewire and catheter were advanced deeper into the gastro renal shunt. The glidewire was exchanged for a superstiff Amplatz wire. The 5 French catheter was removed. Next, attempts were made to advance several occlusion balloons into the gastro renal shunt. Ultimately, a 1 French Merci balloon occlusion catheter was successfully advanced into the gastro renal shunt. The occlusion balloon was inflated and pulled snug against the web near the gastro renal shunt origin. Balloon  occluded venography was then performed. The gastro renal shunt is quite massive. At least 2 large draining veins are identified escaping the gastro renal shunt and draining into the systemic system. These appear to represent the inferior phrenic vein and a second accessory inferior phrenic vein. Coil embolization of these non bleeding vessels will likely be required in order to obtain stasis within the recently hemorrhaged gastric varices. A penumbra lantern microcatheter was advanced coaxially through the balloon occlusion catheter over a 0.016 fathom wire. The catheter was successfully used to select the inferior phrenic vein. The catheter was advanced into the inferior phrenic vein and venography was performed confirming catheter location. The inferior phrenic vein does drain into the systemic symptom and provide an outlet from the gastro renal shunt. Coil embolization was then performed using a series of 3 and 4 mm soft detachable Ruby coils. The microcatheter was brought back into the main body of the gastro renal shunt. The catheter was carefully advanced deeper into the gastro renal shunt and then used to successfully catheterize the next major escape vessel. The catheter was advanced into the vessel and venography performed. This appears to be an accessory inferior phrenic vein which drains into the azygos and hemi azygous system. This vessel must be sacrificed in order to proceed with obliteration of the target gastric varices. Therefore, coil embolization was performed utilizing a combination of 3, 4  and 5 mm Ruby detachable coils. The microcatheter was then brought back into the gastro renal shunt. Additional venography was performed, this time with carbon dioxide gas. The carbon dioxide gas successfully opacifies the entirety of the gastric variceal network. The afferent vein from the portal venous system appears to be the posterior gastric vein. Sclerosis and mixture was then prepared in the standard  fashion (3: 2 : 1 mixture of air : 3% sodium tetradecyl Sulfate : Lipiodol). The sclerosis mixture was then instilled in aliquots through the lantern microcatheter filling the gastro renal shunt and ultimately the gastric varices. Injection was stopped when sclerosis and began to spill into the posterior gastric vein. Approximately 10 mL 3% sodium tetradecyl Sulfate and 4 mL Lipiodol were administered. The microcatheter was then brought back into the proximal gastro renal shunt just beyond the occlusion balloon. Coil embolization of the proximal gastro renal shunt was then performed utilizing numerous 18, 16, 14 mm detachable Ruby microcoils as well as multiple liquid metal packing coils. Once a dense metal plug was successfully created, the occlusion balloon was slowly deflated under real-time fluoroscopic imaging. The sclerosing mass and coil pack were stable. No evidence of mobilization or flow. The microcatheter was removed. The occlusion balloon was brought back into the left renal vein. Left renal venography was performed confirming persistent patency of the renal vein. No evidence of complication. The sheath and occlusion balloon were removed. Hemostasis was attained with the assistance of manual pressure. IMPRESSION: 1. Successful coil embolization of non bleeding accessory portosystemic collaterals (inferior phrenic an accessory inferior phrenic veins) which was required to successfully treated the bleeding gastric varices. 2. Successful coil assisted balloon occluded transvenous obliteration (CA-BRTO) of gastric varices using liquid sclerosant and detachable coils. PLAN: 1. Maintain bed rest overnight. Clear liquids are okay. Advanced diet as tolerated beginning tomorrow if patient remains stable. 2. BRTO protocol CT scan of the abdomen on Tuesday. 3. Patient will require outpatient follow-up EGD in approximately 6 weeks to assess for new or progressive esophageal varices and to assess the treated gastric  varices. 4. Follow-up with Interventional Radiology in 3 months. Signed, Criselda Peaches, MD, [facility] Electronically Signed   By: Jacqulynn Cadet M.D.   On: 04/20/2020 15:16      ECHOCARDIOGRAM COMPLETE  Result Date: 04/19/2020 IMPRESSIONS  1. Left ventricular ejection fraction, by estimation, is 55 to 60%. The left ventricle has normal function. The left ventricle demonstrates regional wall motion abnormalities (see scoring diagram/findings for description). Left ventricular diastolic parameters are consistent with Grade I diastolic dysfunction (impaired relaxation).  2. Right ventricular systolic function is normal. The right ventricular size is normal.  3. Left atrial size was mildly dilated.  4. Moderate pleural effusion in the left lateral region.  5. The mitral valve is degenerative. Moderate mitral valve regurgitation. No evidence of mitral stenosis.  6. The aortic valve is calcified. There is moderate calcification of the aortic valve. There is moderate thickening of the aortic valve. Aortic valve regurgitation is moderate. Moderate aortic valve stenosis. Aortic valve area, by VTI measures 1.17 cm.  Aortic valve mean gradient measures 16.0 mmHg. Aortic valve Vmax measures 3.03 m/s.  7. The inferior vena cava is normal in size with greater than 50% respiratory variability, suggesting right atrial pressure of 3 mmHg.  Electronically signed by Candee Furbish MD Signature Date/Time: 04/19/2020/4:01:15 PM    Final      Scheduled Meds: . Derrill Memo ON 04/22/2020] pantoprazole  40 mg Intravenous Q12H  . tamsulosin  0.4 mg Oral QPC breakfast   Continuous Infusions: . cefTRIAXone (ROCEPHIN)  IV 2 g (04/20/20 2209)  . octreotide  (SANDOSTATIN)    IV infusion 50 mcg/hr (04/20/20 2212)   PRN Meds:.iohexol  ASSESMENT:   *   GI bleed with melena, blood loss anemia.  Known gastric varices. 04/17/20 CTAP: Cirrhosis, portal hypertension  with moderate ascites, large gastric varices with gastric renal shunt. Patent portal and splenic veins. 04/18/2020 EGD (Dr Andrey Farmer at [facility]):  large GOV2 gastric varices, nonbleeding but stigmata of recent bleeding present.  Small, nonbleeding, grade 1 esophageal varices. Blood in the duodenal bulb and second duodenum. 04/20/20 coil assisted BRTO by Dr. Jacqulynn Cadet Octreotide gtt day 3.  2200.  Protonix infusion stopped w last dose 1/29 at 2300.   No prior EGD/colonoscopy before 04/17/20.  *   Cirrhosis of the liver. Hepatitis serologies negative now and in 2015.   Patient denies any significant alcohol use interestingly on CTAP of 2015 (for elevated LFTs particularly alk phos.) he had not only the hernias but changes of chronic pancreatitis and small cystic lesion at the tail of the pancreas possibly a pseudocyst.  CT his liver was normal. Pt denies history of alcohol use/abuse.  No prior knowledge of liver disease. Significantly elevated IgG and IgA, ANA, mitochondrial antibodies and alpha 1 antitrypsin, liver kidney microsomal Ab  WNL 08/2013.  Dr. Benson Norway suspected drug reaction Wonder if cirrhosis is actually sequela of autoimmune hepatitis?     *    Blood loss anemia. 3 PRBCs at [facility].  Hgb 5.7 >> 8.4.   10.6 in July 2021.    *   Thrombocytopenia. Chronic. Platelets 191 >> 100.    *    Ascites. Cephtriaxone in place, day 5 or 6.    04/18/2020 60 mL diagnostic paracentesis, negative for SBP.  *    Massive bilateral inguinal hernias containing large volume of ascites.  Seen on 1/27 CT.    *    Stable pancreatic cyst.  *   DM 2, insulin PTA.    *   CVA 2015,  Previous but not recent Eliquis.    *   L pleural effusion.    *   NSTEMI in setting blood loss anemia.    *   Hearing loss.    *   AKI.  Improved.    PLAN   *   Note orders for repeat larger volume paracentesis w/o labs.   I modified the orders so that the patient will get 3 doses of albumin after paracentesis  and added fluid studies for albumin level and placed a 4 L limit on volume to be removed..    *  Note Dr Avon Gully restarted Protonix 40 mg IV today.   Finish 72 h Octreotide on 2/1 at 0330.    *   Advance to carb mod diet.            Azucena Freed  04/21/2020, 10:25 AM Phone [phone]

## 2020-04-21 NOTE — Progress Notes (Signed)
PT Cancellation Note  Patient Details Name: Elijah [name] MRN: [id number] DOB: 14-Aug-1933   Cancelled Treatment:    Reason Eval/Treat Not Completed: Patient declined, no reason specified. Patient just returned from thoracenteses. Declines PT at this time. Will re-attempt later if time allows.    Tanice Petre 04/21/2020, 1:44 PM

## 2020-04-21 NOTE — Progress Notes (Signed)
PROGRESS NOTE    VUE PAVON  IOE:[id number] DOB: Jan 29, 1934 DOA: 04/18/2020 PCP: Jaclyn Shaggy, MD   Brief Narrative:  Elijah [name] is a 87 y.o. male with medical history significant for CVA in 2015, MSSA bacteremia, essential hypertension, hyperlipidemia, type 2 diabetes who initially presented to [facility] from home on 04/17/2020 due to 4 days of melena, weakness, fatigue, chest pain, and shortness of breath.  Work-up revealed symptomatic blood loss anemia with initial hemoglobin of 5.7K and NSTEMI.  Admitted to the ICU initially, received 3u PRBC to maintain hemoglobin.  Patient evaluated by GI, ICU, cardiology at [facility]. Currently on Protonix drip, received octreotide and Rocephin after CT angio abdomen and pelvis showed cirrhotic liver morphology with large gastric varices. EGD done on 04/18/2020 which showed very large gastric varix without any active bleeding but with evidence of recent bleeding.  Small nonbleeding superficial varices were also noted.  He also had diagnostic paracentesis, no evidence of SBP. Seen by cardiology felt that his uptrending troponins and EKG abnormalities were consistent with NSTEMI in the setting of acute blood loss anemia.  Recommended echocardiogram as well as beta-blockade when able to tolerate; nitro for symptomatic chest pain. GI recommended evaluation for TIPS or BRTO given high risk for rebleeding from the morphology of the size of the gastric varix and to transfer to [facility] for IR guided TIPS.  1/30 Echocardiogram without overt findings per IR will likely proceed with TIPS and CA-BRTO procedure later today pending schedule. 1/31 Tolerated procedure well - patient will need EGD 6 weeks; IR f/up in 8months  Assessment & Plan:  Newly diagnosed cirrhosis of the liver with large acutely bleeding gastric varices Associated ascites Seen by GI at [facility], transferred to [facility] for procedure given availability GI and IR following: TIPS with BRTO 04/21/20 - tolerated  well. Paracentesis today  Acute blood loss anemia secondary to upper GI bleed post EGD on 04/18/2020. Presented initially to [facility] ED with complaints of melena 4 days, with hemoglobin of 5.7 on presentation.  Status post 3u PRBC transfusion since admission Hgb stabilized: Lab Results  Component Value Date   HGB 8.4 (L) 04/21/2020  EGD done on 04/18/2020 showed very large gastric varix without any active bleeding but with evidence of recent bleeding.  Small nonbleeding superficial varices were also noted.  GI at [facility] recommended transfer to [facility] for IR guided TIPS/BRTO (see above) Protonix drip off; currently on octreotide drip per GI through 04/22/20  NSTEMI in the setting of marked acute blood loss anemia. Presented with hemoglobin of 5.7K, baseline hemoglobin 10. Troponin has peaked at greater than 3700 and downtrending appropriately. Remains asymptomatic, cardiology discussion for preoperative clearance, they concur with previous note by Dr. Juliann Pares at [facility], pending echocardiogram  Acute onset ascites from worsening cirrhosis IR following, likely require multiple paracentesis for symptom control during hospital stay  Type 2 diabetes controlled Lab Results  Component Value Date   HGBA1C 5.4 04/18/2020  Hold off home oral antiglycemic's Start insulin sliding scale and hypoglycemic protocol  Isolated elevated AST Avoid hepatotoxic agents Hold off on statin for now  BPH Resume home Flomax Monitor urine output  Physical debility/Ambulatory dysfunction PT to assess Fall precautions.  DVT prophylaxis: SCDs; avoid chemical prophylaxis given above Code Status: DNR confirmed at admission Family Communication: None available  Status is: Inpatient  Dispo: The patient is from: Home              Anticipated d/c is to: To be  determined              Anticipated d/c date is: 48-72h              Patient currently not medically stable for discharge  Consultants:    PCCM, IR, GI, cardiology  Procedures:   Upper endoscopy 04/18/2020  TIPS/BRTO pending clearance  Antimicrobials:  Ceftriaxone  Subjective: No acute issues or events overnight, patient tolerated procedure and begging for advancement of diet, denies nausea, vomiting, diarrhea, constipation, headache, fevers, chills.  Objective: Vitals:   04/21/20 0530 04/21/20 0820 04/21/20 1353 04/21/20 1602  BP: 116/72 118/70 117/70 112/67  Pulse: 71 80 78 80  Resp: 14 15 17 15   Temp: 97.8 F (36.6 C) 97.7 F (36.5 C) 97.6 F (36.4 C) 97.9 F (36.6 C)  TempSrc: Oral Oral Oral Oral  SpO2: 98% 97% 98% 100%  Weight:      Height:        Intake/Output Summary (Last 24 hours) at 04/21/2020 1813 Last data filed at 04/21/2020 1324 Gross per 24 hour  Intake --  Output 3300 ml  Net -3300 ml   Filed Weights   04/20/20 0656 04/21/20 0500  Weight: 90 kg 93.4 kg    Examination:  General:  Pleasantly resting in bed, No acute distress. HEENT:  Normocephalic atraumatic.  Sclerae nonicteric, noninjected.  Extraocular movements intact bilaterally. Neck:  Without mass or deformity.  Trachea is midline. Lungs:  Clear to auscultate bilaterally without rhonchi, wheeze, or rales. Heart:  Regular rate and rhythm.  Without murmurs, rubs, or gallops. Abdomen:  Soft, nontender, moderately distended.  Without guarding or rebound. Extremities: Without cyanosis, clubbing, edema, or obvious deformity. Vascular:  Dorsalis pedis and posterior tibial pulses palpable bilaterally. Skin:  Warm and dry, no erythema, no ulcerations.   Data Reviewed: I have personally reviewed following labs and imaging studies  CBC: Recent Labs  Lab 04/18/20 0615 04/18/20 1436 04/18/20 1951 04/19/20 0055 04/19/20 0606 04/20/20 0308 04/21/20 0136  WBC 11.2* 10.6*  --   --  9.2 7.8 8.9  NEUTROABS  --  8.9*  --   --   --   --   --   HGB 5.9* 7.2* 8.4* 8.6* 8.2* 8.1* 8.4*  HCT 18.5* 21.4* 24.9* 24.4* 23.5* 24.2* 25.4*   MCV 104.5* 97.3  --   --  96.3 97.2 99.6  PLT 124* 131*  --   --  123* 109* 100*   Basic Metabolic Panel: Recent Labs  Lab 04/17/20 1419 04/18/20 0515 04/19/20 0606 04/20/20 0308 04/21/20 0136  NA 140 143 143 145 141  K 4.4 4.4 3.9 3.7 3.7  CL 106 111 112* 114* 112*  CO2 13* 19* 22 22 19*  GLUCOSE 243* 156* 137* 136* 163*  BUN 56* 64* 53* 45* 35*  CREATININE 1.12 1.11 1.21 1.20 1.06  CALCIUM 8.7* 8.6* 8.3* 8.1* 7.9*  MG  --  1.8  --   --   --   PHOS  --  3.9  --   --   --    GFR: Estimated Creatinine Clearance: 62 mL/min (by C-G formula based on SCr of 1.06 mg/dL). Liver Function Tests: Recent Labs  Lab 04/17/20 1419 04/18/20 0515  AST 63* 60*  ALT 33 31  ALKPHOS 110 93  BILITOT 1.1 0.8  PROT 6.5 5.8*  ALBUMIN 2.5* 2.4*   No results for input(s): LIPASE, AMYLASE in the last 168 hours. Recent Labs  Lab 04/18/20 0500  AMMONIA 65*  Coagulation Profile: Recent Labs  Lab 04/17/20 1714  INR 1.6*   Cardiac Enzymes: No results for input(s): CKTOTAL, CKMB, CKMBINDEX, TROPONINI in the last 168 hours. BNP (last 3 results) No results for input(s): PROBNP in the last 8760 hours. HbA1C: No results for input(s): HGBA1C in the last 72 hours. CBG: Recent Labs  Lab 04/18/20 0644 04/18/20 1303  GLUCAP 147* 136*   Lipid Profile: No results for input(s): CHOL, HDL, LDLCALC, TRIG, CHOLHDL, LDLDIRECT in the last 72 hours. Thyroid Function Tests: No results for input(s): TSH, T4TOTAL, FREET4, T3FREE, THYROIDAB in the last 72 hours. Anemia Panel: No results for input(s): VITAMINB12, FOLATE, FERRITIN, TIBC, IRON, RETICCTPCT in the last 72 hours. Sepsis Labs: Recent Labs  Lab 04/17/20 1827 04/17/20 1931 04/18/20 0515 04/18/20 1951 04/19/20 0606  PROCALCITON 0.24  --  0.29  --   --   LATICACIDVEN  --  9.3* 5.0* 2.3* 1.4    Recent Results (from the past 240 hour(s))  SARS Coronavirus 2 by RT PCR (hospital order, performed in [facility] hospital lab)  Nasopharyngeal Nasopharyngeal Swab     Status: None   Collection Time: 04/17/20  5:14 PM   Specimen: Nasopharyngeal Swab  Result Value Ref Range Status   SARS Coronavirus 2 NEGATIVE NEGATIVE Final    Comment: (NOTE) SARS-CoV-2 target nucleic acids are NOT DETECTED.  The SARS-CoV-2 RNA is generally detectable in upper and lower respiratory specimens during the acute phase of infection. The lowest concentration of SARS-CoV-2 viral copies this assay can detect is 250 copies / mL. A negative result does not preclude SARS-CoV-2 infection and should not be used as the sole basis for treatment or other patient management decisions.  A negative result may occur with improper specimen collection / handling, submission of specimen other than nasopharyngeal swab, presence of viral mutation(s) within the areas targeted by this assay, and inadequate number of viral copies (<250 copies / mL). A negative result must be combined with clinical observations, patient history, and epidemiological information.  Fact Sheet for Patients:   BoilerBrush.com.cy  Fact Sheet for Healthcare Providers: https://pope.com/  This test is not yet approved or  cleared by the [place] FDA and has been authorized for detection and/or diagnosis of SARS-CoV-2 by FDA under an Emergency Use Authorization (EUA).  This EUA will remain in effect (meaning this test can be used) for the duration of the COVID-19 declaration under Section 564(b)(1) of the Act, 21 U.S.C. section 360bbb-3(b)(1), unless the authorization is terminated or revoked sooner.  Performed at [facility], [address] [place], [place] [address]   Culture, blood (routine x 2)     Status: None (Preliminary result)   Collection Time: 04/17/20  6:27 PM   Specimen: BLOOD  Result Value Ref Range Status   Specimen Description BLOOD BLOOD LEFT FOREARM  Final   Special Requests   Final     BOTTLES DRAWN AEROBIC AND ANAEROBIC Blood Culture adequate volume   Culture   Final    NO GROWTH 4 DAYS Performed at [facility], [address] [place], [place] [address]    Report Status PENDING  Incomplete  Culture, blood (routine x 2)     Status: None (Preliminary result)   Collection Time: 04/17/20  6:31 PM   Specimen: BLOOD  Result Value Ref Range Status   Specimen Description BLOOD BLOOD RIGHT WRIST  Final   Special Requests   Final    BOTTLES DRAWN AEROBIC AND ANAEROBIC Blood Culture results may  not be optimal due to an inadequate volume of blood received in culture bottles   Culture   Final    NO GROWTH 4 DAYS Performed at [facility], [address] [place], [place] [address]    Report Status PENDING  Incomplete  Aerobic/Anaerobic Culture (surgical/deep wound)     Status: None (Preliminary result)   Collection Time: 04/18/20  3:33 PM   Specimen: PATH Cytology Peritoneal fluid  Result Value Ref Range Status   Specimen Description   Final    PERITONEAL Performed at [facility], [address] [place], [place] [address]    Special Requests   Final    PERITONEAL Performed at [facility], [address] [place], [place] [address]    Gram Stain   Final    FEW WBC PRESENT, PREDOMINANTLY MONONUCLEAR NO ORGANISMS SEEN    Culture   Final    NO GROWTH 3 DAYS Performed at [facility], [address] [place], [place] [address]    Report Status PENDING  Incomplete         Radiology Studies: IR Angiogram Selective Each Additional Vessel  Result Date: 04/20/2020 CLINICAL DATA:  87 year old male with 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 He presents today for balloon occluded transvenous obliteration of gastric varices. EXAM: 1. Ultrasound-guided access right internal jugular  vein 2. Catheterization and venogram, left renal vein 3. Catheterization and venogram of gastro renal shunt 4. Catheterization and venogram of left inferior phrenic vein 5. Coil embolization of left inferior phrenic vein 6. Catheterization of venogram of accessory left inferior phrenic vein 7. Coil embolization of accessory left inferior phrenic vein 8. Embolization of gastric varices and gastro renal shunt MEDICATIONS: None. ANESTHESIA/SEDATION: 2 mg Versed and 100 mcg fentanyl were administered intravenously. Moderate Sedation Time:  64 minutes The patient was continuously monitored during the procedure by the interventional radiology nurse under my direct supervision. CONTRAST:  120 mL Omnipaque 350 FLUOROSCOPY TIME:  Fluoroscopy Time: 40 minutes 30 seconds (1472 mGy). COMPLICATIONS: None immediate. PROCEDURE: Informed written consent was obtained from the patient after a thorough discussion of the procedural risks, benefits and alternatives. All questions were addressed. Maximal Sterile Barrier Technique was utilized including caps, mask, sterile gowns, sterile gloves, sterile drape, hand hygiene and skin antiseptic. A timeout was performed prior to the initiation of the procedure. The right internal jugular vein was interrogated with ultrasound and found to be widely patent. An image was obtained and stored for the medical record. Local anesthesia was attained by infiltration with 1% lidocaine. A small dermatotomy was made. Under real-time sonographic guidance, the vessel was punctured with a 21 gauge micropuncture needle. Using standard technique, the initial micro needle was exchanged over a 0.018 micro wire for a transitional 4 [place] micro sheath. The micro sheath was then exchanged over a 0.035 wire for a 10 French fascial dilator and the soft tissue tract was dilated. Next, a 10 French tips sheath was carefully advanced over the wire and into the inferior vena cava. An angled 5 French catheter was then  advanced over the wire. The catheter was used to select the left renal vein. A left renal venogram was performed. Standard venous anatomy. No evidence of left renal venous thrombus. A rose in wire was parked in an inferior branch of the left renal vein. The tips sheath was then advanced into  the origin of the renal vein. Additional left renal venography was performed. The inflow of the gastro renal shunt was successfully identified along the superior margin of the left renal vein. The angled catheter was introduced through the tips sheath coaxially adjacent to the stabilizing Rosen wire. Utilizing a Glidewire, the catheter was successfully advanced into proximal left gastro renal shunt. Venography was again performed. The renal shunt is robust. The expected web near the origin was identified. Flow direction is toward the renal vein. The Glidewire and catheter were advanced deeper into the gastro renal shunt. The glidewire was exchanged for a superstiff Amplatz wire. The 5 French catheter was removed. Next, attempts were made to advance several occlusion balloons into the gastro renal shunt. Ultimately, a 62 French Merci balloon occlusion catheter was successfully advanced into the gastro renal shunt. The occlusion balloon was inflated and pulled snug against the web near the gastro renal shunt origin. Balloon occluded venography was then performed. The gastro renal shunt is quite massive. At least 2 large draining veins are identified escaping the gastro renal shunt and draining into the systemic system. These appear to represent the inferior phrenic vein and a second accessory inferior phrenic vein. Coil embolization of these non bleeding vessels will likely be required in order to obtain stasis within the recently hemorrhaged gastric varices. A penumbra lantern microcatheter was advanced coaxially through the balloon occlusion catheter over a 0.016 fathom wire. The catheter was successfully used to select the  inferior phrenic vein. The catheter was advanced into the inferior phrenic vein and venography was performed confirming catheter location. The inferior phrenic vein does drain into the systemic symptom and provide an outlet from the gastro renal shunt. Coil embolization was then performed using a series of 3 and 4 mm soft detachable Ruby coils. The microcatheter was brought back into the main body of the gastro renal shunt. The catheter was carefully advanced deeper into the gastro renal shunt and then used to successfully catheterize the next major escape vessel. The catheter was advanced into the vessel and venography performed. This appears to be an accessory inferior phrenic vein which drains into the azygos and hemi azygous system. This vessel must be sacrificed in order to proceed with obliteration of the target gastric varices. Therefore, coil embolization was performed utilizing a combination of 3, 4 and 5 mm Ruby detachable coils. The microcatheter was then brought back into the gastro renal shunt. Additional venography was performed, this time with carbon dioxide gas. The carbon dioxide gas successfully opacifies the entirety of the gastric variceal network. The afferent vein from the portal venous system appears to be the posterior gastric vein. Sclerosis and mixture was then prepared in the standard fashion (3: 2 : 1 mixture of air : 3% sodium tetradecyl Sulfate : Lipiodol). The sclerosis mixture was then instilled in aliquots through the lantern microcatheter filling the gastro renal shunt and ultimately the gastric varices. Injection was stopped when sclerosis and began to spill into the posterior gastric vein. Approximately 10 mL 3% sodium tetradecyl Sulfate and 4 mL Lipiodol were administered. The microcatheter was then brought back into the proximal gastro renal shunt just beyond the occlusion balloon. Coil embolization of the proximal gastro renal shunt was then performed utilizing numerous 18, 16,  14 mm detachable Ruby microcoils as well as multiple liquid metal packing coils. Once a dense metal plug was successfully created, the occlusion balloon was slowly deflated under real-time fluoroscopic imaging. The sclerosing mass and coil pack were stable.  No evidence of mobilization or flow. The microcatheter was removed. The occlusion balloon was brought back into the left renal vein. Left renal venography was performed confirming persistent patency of the renal vein. No evidence of complication. The sheath and occlusion balloon were removed. Hemostasis was attained with the assistance of manual pressure. IMPRESSION: 1. Successful coil embolization of non bleeding accessory portosystemic collaterals (inferior phrenic an accessory inferior phrenic veins) which was required to successfully treated the bleeding gastric varices. 2. Successful coil assisted balloon occluded transvenous obliteration (CA-BRTO) of gastric varices using liquid sclerosant and detachable coils. PLAN: 1. Maintain bed rest overnight. Clear liquids are okay. Advanced diet as tolerated beginning tomorrow if patient remains stable. 2. BRTO protocol CT scan of the abdomen on Tuesday. 3. Patient will require outpatient follow-up EGD in approximately 6 weeks to assess for new or progressive esophageal varices and to assess the treated gastric varices. 4. Follow-up with Interventional Radiology in 3 months. Signed, Sterling Big, MD, RPVI [facility] Radiology Electronically Signed   By: Malachy Moan M.D.   On: 04/20/2020 15:16   IR Angiogram Selective Each Additional Vessel  Result Date: 04/20/2020 CLINICAL DATA:  87 year old male with 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 He presents today for balloon occluded transvenous obliteration of  gastric varices. EXAM: 1. Ultrasound-guided access right internal jugular vein 2. Catheterization and venogram, left renal vein 3. Catheterization and venogram of gastro renal shunt 4. Catheterization and venogram of left inferior phrenic vein 5. Coil embolization of left inferior phrenic vein 6. Catheterization of venogram of accessory left inferior phrenic vein 7. Coil embolization of accessory left inferior phrenic vein 8. Embolization of gastric varices and gastro renal shunt MEDICATIONS: None. ANESTHESIA/SEDATION: 2 mg Versed and 100 mcg fentanyl were administered intravenously. Moderate Sedation Time:  64 minutes The patient was continuously monitored during the procedure by the interventional radiology nurse under my direct supervision. CONTRAST:  120 mL Omnipaque 350 FLUOROSCOPY TIME:  Fluoroscopy Time: 40 minutes 30 seconds (1472 mGy). COMPLICATIONS: None immediate. PROCEDURE: Informed written consent was obtained from the patient after a thorough discussion of the procedural risks, benefits and alternatives. All questions were addressed. Maximal Sterile Barrier Technique was utilized including caps, mask, sterile gowns, sterile gloves, sterile drape, hand hygiene and skin antiseptic. A timeout was performed prior to the initiation of the procedure. The right internal jugular vein was interrogated with ultrasound and found to be widely patent. An image was obtained and stored for the medical record. Local anesthesia was attained by infiltration with 1% lidocaine. A small dermatotomy was made. Under real-time sonographic guidance, the vessel was punctured with a 21 gauge micropuncture needle. Using standard technique, the initial micro needle was exchanged over a 0.018 micro wire for a transitional 4 [place] micro sheath. The micro sheath was then exchanged over a 0.035 wire for a 10 French fascial dilator and the soft tissue tract was dilated. Next, a 10 French tips sheath was carefully advanced over the  wire and into the inferior vena cava. An angled 5 French catheter was then advanced over the wire. The catheter was used to select the left renal vein. A left renal venogram was performed. Standard venous anatomy. No evidence of left renal venous thrombus. A rose in wire was parked in an inferior branch of the left renal vein. The tips sheath was then advanced into  the origin of the renal vein. Additional left renal venography was performed. The inflow of the gastro renal shunt was successfully identified along the superior margin of the left renal vein. The angled catheter was introduced through the tips sheath coaxially adjacent to the stabilizing Rosen wire. Utilizing a Glidewire, the catheter was successfully advanced into proximal left gastro renal shunt. Venography was again performed. The renal shunt is robust. The expected web near the origin was identified. Flow direction is toward the renal vein. The Glidewire and catheter were advanced deeper into the gastro renal shunt. The glidewire was exchanged for a superstiff Amplatz wire. The 5 French catheter was removed. Next, attempts were made to advance several occlusion balloons into the gastro renal shunt. Ultimately, a 749 French Merci balloon occlusion catheter was successfully advanced into the gastro renal shunt. The occlusion balloon was inflated and pulled snug against the web near the gastro renal shunt origin. Balloon occluded venography was then performed. The gastro renal shunt is quite massive. At least 2 large draining veins are identified escaping the gastro renal shunt and draining into the systemic system. These appear to represent the inferior phrenic vein and a second accessory inferior phrenic vein. Coil embolization of these non bleeding vessels will likely be required in order to obtain stasis within the recently hemorrhaged gastric varices. A penumbra lantern microcatheter was advanced coaxially through the balloon occlusion catheter over  a 0.016 fathom wire. The catheter was successfully used to select the inferior phrenic vein. The catheter was advanced into the inferior phrenic vein and venography was performed confirming catheter location. The inferior phrenic vein does drain into the systemic symptom and provide an outlet from the gastro renal shunt. Coil embolization was then performed using a series of 3 and 4 mm soft detachable Ruby coils. The microcatheter was brought back into the main body of the gastro renal shunt. The catheter was carefully advanced deeper into the gastro renal shunt and then used to successfully catheterize the next major escape vessel. The catheter was advanced into the vessel and venography performed. This appears to be an accessory inferior phrenic vein which drains into the azygos and hemi azygous system. This vessel must be sacrificed in order to proceed with obliteration of the target gastric varices. Therefore, coil embolization was performed utilizing a combination of 3, 4 and 5 mm Ruby detachable coils. The microcatheter was then brought back into the gastro renal shunt. Additional venography was performed, this time with carbon dioxide gas. The carbon dioxide gas successfully opacifies the entirety of the gastric variceal network. The afferent vein from the portal venous system appears to be the posterior gastric vein. Sclerosis and mixture was then prepared in the standard fashion (3: 2 : 1 mixture of air : 3% sodium tetradecyl Sulfate : Lipiodol). The sclerosis mixture was then instilled in aliquots through the lantern microcatheter filling the gastro renal shunt and ultimately the gastric varices. Injection was stopped when sclerosis and began to spill into the posterior gastric vein. Approximately 10 mL 3% sodium tetradecyl Sulfate and 4 mL Lipiodol were administered. The microcatheter was then brought back into the proximal gastro renal shunt just beyond the occlusion balloon. Coil embolization of the  proximal gastro renal shunt was then performed utilizing numerous 18, 16, 14 mm detachable Ruby microcoils as well as multiple liquid metal packing coils. Once a dense metal plug was successfully created, the occlusion balloon was slowly deflated under real-time fluoroscopic imaging. The sclerosing mass and coil pack were stable.  No evidence of mobilization or flow. The microcatheter was removed. The occlusion balloon was brought back into the left renal vein. Left renal venography was performed confirming persistent patency of the renal vein. No evidence of complication. The sheath and occlusion balloon were removed. Hemostasis was attained with the assistance of manual pressure. IMPRESSION: 1. Successful coil embolization of non bleeding accessory portosystemic collaterals (inferior phrenic an accessory inferior phrenic veins) which was required to successfully treated the bleeding gastric varices. 2. Successful coil assisted balloon occluded transvenous obliteration (CA-BRTO) of gastric varices using liquid sclerosant and detachable coils. PLAN: 1. Maintain bed rest overnight. Clear liquids are okay. Advanced diet as tolerated beginning tomorrow if patient remains stable. 2. BRTO protocol CT scan of the abdomen on Tuesday. 3. Patient will require outpatient follow-up EGD in approximately 6 weeks to assess for new or progressive esophageal varices and to assess the treated gastric varices. 4. Follow-up with Interventional Radiology in 3 months. Signed, Sterling Big, MD, RPVI [facility] Radiology Electronically Signed   By: Malachy Moan M.D.   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 He presents today for balloon occluded transvenous obliteration of gastric varices. EXAM: 1. Ultrasound-guided access right internal jugular vein 2. Catheterization and venogram, left renal vein 3. Catheterization and venogram of gastro renal shunt 4. Catheterization and venogram of left inferior phrenic vein 5. Coil embolization of left inferior phrenic vein 6. Catheterization of venogram of accessory left inferior phrenic vein 7. Coil embolization of accessory left inferior phrenic vein 8. Embolization of gastric varices and gastro renal shunt MEDICATIONS: None. ANESTHESIA/SEDATION: 2 mg Versed and 100 mcg fentanyl were administered intravenously. Moderate Sedation Time:  64 minutes The patient was continuously monitored during the procedure by the interventional radiology nurse under my direct supervision. CONTRAST:  120 mL Omnipaque 350 FLUOROSCOPY TIME:  Fluoroscopy Time: 40 minutes 30 seconds (1472 mGy). COMPLICATIONS: None immediate. PROCEDURE: Informed written consent was obtained from the patient after a thorough discussion of the procedural risks, benefits and alternatives. All questions were addressed. Maximal Sterile Barrier Technique was utilized including caps, mask, sterile gowns, sterile gloves, sterile drape, hand hygiene and skin antiseptic. A timeout was performed prior to the initiation of the procedure. The right internal jugular vein was interrogated with ultrasound and found to be widely patent. An image was obtained and stored for the medical record. Local anesthesia was attained by infiltration with 1% lidocaine. A small dermatotomy was made. Under real-time sonographic guidance, the vessel was punctured with a 21 gauge micropuncture needle. Using standard technique, the initial micro needle was exchanged over a 0.018 micro wire for a transitional 4 [place] micro sheath. The micro sheath was then exchanged over a 0.035 wire for a 10 French fascial dilator and the soft tissue tract  was dilated. Next, a 10 French tips sheath was carefully advanced over the wire and into the inferior vena cava. An angled 5 French catheter was then advanced over the wire. The catheter was used to select the left renal vein. A left renal venogram was performed. Standard venous anatomy. No evidence of left renal venous thrombus. A rose in wire was parked in an inferior branch of the left renal vein. The tips sheath was then advanced into  the origin of the renal vein. Additional left renal venography was performed. The inflow of the gastro renal shunt was successfully identified along the superior margin of the left renal vein. The angled catheter was introduced through the tips sheath coaxially adjacent to the stabilizing Rosen wire. Utilizing a Glidewire, the catheter was successfully advanced into proximal left gastro renal shunt. Venography was again performed. The renal shunt is robust. The expected web near the origin was identified. Flow direction is toward the renal vein. The Glidewire and catheter were advanced deeper into the gastro renal shunt. The glidewire was exchanged for a superstiff Amplatz wire. The 5 French catheter was removed. Next, attempts were made to advance several occlusion balloons into the gastro renal shunt. Ultimately, a 38 French Merci balloon occlusion catheter was successfully advanced into the gastro renal shunt. The occlusion balloon was inflated and pulled snug against the web near the gastro renal shunt origin. Balloon occluded venography was then performed. The gastro renal shunt is quite massive. At least 2 large draining veins are identified escaping the gastro renal shunt and draining into the systemic system. These appear to represent the inferior phrenic vein and a second accessory inferior phrenic vein. Coil embolization of these non bleeding vessels will likely be required in order to obtain stasis within the recently hemorrhaged gastric varices. A penumbra lantern  microcatheter was advanced coaxially through the balloon occlusion catheter over a 0.016 fathom wire. The catheter was successfully used to select the inferior phrenic vein. The catheter was advanced into the inferior phrenic vein and venography was performed confirming catheter location. The inferior phrenic vein does drain into the systemic symptom and provide an outlet from the gastro renal shunt. Coil embolization was then performed using a series of 3 and 4 mm soft detachable Ruby coils. The microcatheter was brought back into the main body of the gastro renal shunt. The catheter was carefully advanced deeper into the gastro renal shunt and then used to successfully catheterize the next major escape vessel. The catheter was advanced into the vessel and venography performed. This appears to be an accessory inferior phrenic vein which drains into the azygos and hemi azygous system. This vessel must be sacrificed in order to proceed with obliteration of the target gastric varices. Therefore, coil embolization was performed utilizing a combination of 3, 4 and 5 mm Ruby detachable coils. The microcatheter was then brought back into the gastro renal shunt. Additional venography was performed, this time with carbon dioxide gas. The carbon dioxide gas successfully opacifies the entirety of the gastric variceal network. The afferent vein from the portal venous system appears to be the posterior gastric vein. Sclerosis and mixture was then prepared in the standard fashion (3: 2 : 1 mixture of air : 3% sodium tetradecyl Sulfate : Lipiodol). The sclerosis mixture was then instilled in aliquots through the lantern microcatheter filling the gastro renal shunt and ultimately the gastric varices. Injection was stopped when sclerosis and began to spill into the posterior gastric vein. Approximately 10 mL 3% sodium tetradecyl Sulfate and 4 mL Lipiodol were administered. The microcatheter was then brought back into the proximal  gastro renal shunt just beyond the occlusion balloon. Coil embolization of the proximal gastro renal shunt was then performed utilizing numerous 18, 16, 14 mm detachable Ruby microcoils as well as multiple liquid metal packing coils. Once a dense metal plug was successfully created, the occlusion balloon was slowly deflated under real-time fluoroscopic imaging. The sclerosing mass and coil pack were stable.  No evidence of mobilization or flow. The microcatheter was removed. The occlusion balloon was brought back into the left renal vein. Left renal venography was performed confirming persistent patency of the renal vein. No evidence of complication. The sheath and occlusion balloon were removed. Hemostasis was attained with the assistance of manual pressure. IMPRESSION: 1. Successful coil embolization of non bleeding accessory portosystemic collaterals (inferior phrenic an accessory inferior phrenic veins) which was required to successfully treated the bleeding gastric varices. 2. Successful coil assisted balloon occluded transvenous obliteration (CA-BRTO) of gastric varices using liquid sclerosant and detachable coils. PLAN: 1. Maintain bed rest overnight. Clear liquids are okay. Advanced diet as tolerated beginning tomorrow if patient remains stable. 2. BRTO protocol CT scan of the abdomen on Tuesday. 3. Patient will require outpatient follow-up EGD in approximately 6 weeks to assess for new or progressive esophageal varices and to assess the treated gastric varices. 4. Follow-up with Interventional Radiology in 3 months. Signed, Sterling Big, MD, RPVI [facility] Interventional Radiology Special[facility] Radiology Electronically Signed   By: Malachy Moan M.D.   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 He presents today for balloon occluded transvenous obliteration of gastric varices. EXAM: 1. Ultrasound-guided access right internal jugular vein 2. Catheterization and venogram, left renal vein 3. Catheterization and venogram of gastro renal shunt 4. Catheterization and venogram of left inferior phrenic vein 5. Coil embolization of left inferior phrenic vein 6. Catheterization of venogram of accessory left inferior phrenic vein 7. Coil embolization of accessory left inferior phrenic vein 8. Embolization of gastric varices and gastro renal shunt MEDICATIONS: None. ANESTHESIA/SEDATION: 2 mg Versed and 100 mcg fentanyl were administered intravenously. Moderate Sedation Time:  64 minutes The patient was continuously monitored during the procedure by the interventional radiology nurse under my direct supervision. CONTRAST:  120 mL Omnipaque 350 FLUOROSCOPY TIME:  Fluoroscopy Time: 40 minutes 30 seconds (1472 mGy). COMPLICATIONS: None immediate. PROCEDURE: Informed written consent was obtained from the patient after a thorough discussion of the procedural risks, benefits and alternatives. All questions were addressed. Maximal Sterile Barrier Technique was utilized including caps, mask, sterile gowns, sterile gloves, sterile drape, hand hygiene and skin antiseptic. A timeout was performed prior to the initiation of the procedure. The right internal jugular vein was interrogated with ultrasound and found to be widely patent. An image was obtained and stored for the medical record. Local anesthesia was attained by infiltration with 1% lidocaine. A small dermatotomy was made. Under real-time sonographic guidance, the vessel was punctured with a 21 gauge micropuncture needle. Using standard technique, the initial micro needle was exchanged over a 0.018 micro wire for a transitional 4 [place] micro sheath. The micro sheath was then exchanged over a 0.035 wire for  a 10 French fascial dilator and the soft tissue tract was dilated. Next, a 10 French tips sheath was carefully advanced over the wire and into the inferior vena cava. An angled 5 French catheter was then advanced over the wire. The catheter was used to select the left renal vein. A left renal venogram was performed. Standard venous anatomy. No evidence of left renal venous thrombus. A rose in wire was parked in an inferior branch of the left renal vein. The tips sheath was then advanced into the  origin of the renal vein. Additional left renal venography was performed. The inflow of the gastro renal shunt was successfully identified along the superior margin of the left renal vein. The angled catheter was introduced through the tips sheath coaxially adjacent to the stabilizing Rosen wire. Utilizing a Glidewire, the catheter was successfully advanced into proximal left gastro renal shunt. Venography was again performed. The renal shunt is robust. The expected web near the origin was identified. Flow direction is toward the renal vein. The Glidewire and catheter were advanced deeper into the gastro renal shunt. The glidewire was exchanged for a superstiff Amplatz wire. The 5 French catheter was removed. Next, attempts were made to advance several occlusion balloons into the gastro renal shunt. Ultimately, a 53 French Merci balloon occlusion catheter was successfully advanced into the gastro renal shunt. The occlusion balloon was inflated and pulled snug against the web near the gastro renal shunt origin. Balloon occluded venography was then performed. The gastro renal shunt is quite massive. At least 2 large draining veins are identified escaping the gastro renal shunt and draining into the systemic system. These appear to represent the inferior phrenic vein and a second accessory inferior phrenic vein. Coil embolization of these non bleeding vessels will likely be required in order to obtain stasis within the recently  hemorrhaged gastric varices. A penumbra lantern microcatheter was advanced coaxially through the balloon occlusion catheter over a 0.016 fathom wire. The catheter was successfully used to select the inferior phrenic vein. The catheter was advanced into the inferior phrenic vein and venography was performed confirming catheter location. The inferior phrenic vein does drain into the systemic symptom and provide an outlet from the gastro renal shunt. Coil embolization was then performed using a series of 3 and 4 mm soft detachable Ruby coils. The microcatheter was brought back into the main body of the gastro renal shunt. The catheter was carefully advanced deeper into the gastro renal shunt and then used to successfully catheterize the next major escape vessel. The catheter was advanced into the vessel and venography performed. This appears to be an accessory inferior phrenic vein which drains into the azygos and hemi azygous system. This vessel must be sacrificed in order to proceed with obliteration of the target gastric varices. Therefore, coil embolization was performed utilizing a combination of 3, 4 and 5 mm Ruby detachable coils. The microcatheter was then brought back into the gastro renal shunt. Additional venography was performed, this time with carbon dioxide gas. The carbon dioxide gas successfully opacifies the entirety of the gastric variceal network. The afferent vein from the portal venous system appears to be the posterior gastric vein. Sclerosis and mixture was then prepared in the standard fashion (3: 2 : 1 mixture of air : 3% sodium tetradecyl Sulfate : Lipiodol). The sclerosis mixture was then instilled in aliquots through the lantern microcatheter filling the gastro renal shunt and ultimately the gastric varices. Injection was stopped when sclerosis and began to spill into the posterior gastric vein. Approximately 10 mL 3% sodium tetradecyl Sulfate and 4 mL Lipiodol were administered. The  microcatheter was then brought back into the proximal gastro renal shunt just beyond the occlusion balloon. Coil embolization of the proximal gastro renal shunt was then performed utilizing numerous 18, 16, 14 mm detachable Ruby microcoils as well as multiple liquid metal packing coils. Once a dense metal plug was successfully created, the occlusion balloon was slowly deflated under real-time fluoroscopic imaging. The sclerosing mass and coil pack were stable. No  evidence of mobilization or flow. The microcatheter was removed. The occlusion balloon was brought back into the left renal vein. Left renal venography was performed confirming persistent patency of the renal vein. No evidence of complication. The sheath and occlusion balloon were removed. Hemostasis was attained with the assistance of manual pressure. IMPRESSION: 1. Successful coil embolization of non bleeding accessory portosystemic collaterals (inferior phrenic an accessory inferior phrenic veins) which was required to successfully treated the bleeding gastric varices. 2. Successful coil assisted balloon occluded transvenous obliteration (CA-BRTO) of gastric varices using liquid sclerosant and detachable coils. PLAN: 1. Maintain bed rest overnight. Clear liquids are okay. Advanced diet as tolerated beginning tomorrow if patient remains stable. 2. BRTO protocol CT scan of the abdomen on Tuesday. 3. Patient will require outpatient follow-up EGD in approximately 6 weeks to assess for new or progressive esophageal varices and to assess the treated gastric varices. 4. Follow-up with Interventional Radiology in 3 months. Signed, Sterling Big, MD, RPVI [facility]ascular and Interventional Radiology Specialists [facility] Radiology Electronically Signed   By: Malachy Moan M.D.   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 He presents today for balloon occluded transvenous obliteration of gastric varices. EXAM: 1. Ultrasound-guided access right internal jugular vein 2. Catheterization and venogram, left renal vein 3. Catheterization and venogram of gastro renal shunt 4. Catheterization and venogram of left inferior phrenic vein 5. Coil embolization of left inferior phrenic vein 6. Catheterization of venogram of accessory left inferior phrenic vein 7. Coil embolization of accessory left inferior phrenic vein 8. Embolization of gastric varices and gastro renal shunt MEDICATIONS: None. ANESTHESIA/SEDATION: 2 mg Versed and 100 mcg fentanyl were administered intravenously. Moderate Sedation Time:  64 minutes The patient was continuously monitored during the procedure by the interventional radiology nurse under my direct supervision. CONTRAST:  120 mL Omnipaque 350 FLUOROSCOPY TIME:  Fluoroscopy Time: 40 minutes 30 seconds (1472 mGy). COMPLICATIONS: None immediate. PROCEDURE: Informed written consent was obtained from the patient after a thorough discussion of the procedural risks, benefits and alternatives. All questions were addressed. Maximal Sterile Barrier Technique was utilized including caps, mask, sterile gowns, sterile gloves, sterile drape, hand hygiene and skin antiseptic. A timeout was performed prior to the initiation of the procedure. The right internal jugular vein was interrogated with ultrasound and found to be widely patent. An image was obtained and stored for the medical record. Local anesthesia was attained by infiltration with 1% lidocaine. A small dermatotomy was made. Under real-time sonographic guidance, the vessel was punctured with a 21 gauge micropuncture needle. Using standard technique, the initial micro needle was exchanged over a 0.018 micro wire for a transitional 4 [place] micro sheath.  The micro sheath was then exchanged over a 0.035 wire for a 10 French fascial dilator and the soft tissue tract was dilated. Next, a 10 French tips sheath was carefully advanced over the wire and into the inferior vena cava. An angled 5 French catheter was then advanced over the wire. The catheter was used to select the left renal vein. A left renal venogram was performed. Standard venous anatomy. No evidence of left renal venous thrombus. A rose in wire was parked in an inferior branch of the left renal vein. The tips sheath was then advanced into the  origin of the renal vein. Additional left renal venography was performed. The inflow of the gastro renal shunt was successfully identified along the superior margin of the left renal vein. The angled catheter was introduced through the tips sheath coaxially adjacent to the stabilizing Rosen wire. Utilizing a Glidewire, the catheter was successfully advanced into proximal left gastro renal shunt. Venography was again performed. The renal shunt is robust. The expected web near the origin was identified. Flow direction is toward the renal vein. The Glidewire and catheter were advanced deeper into the gastro renal shunt. The glidewire was exchanged for a superstiff Amplatz wire. The 5 French catheter was removed. Next, attempts were made to advance several occlusion balloons into the gastro renal shunt. Ultimately, a 69 French Merci balloon occlusion catheter was successfully advanced into the gastro renal shunt. The occlusion balloon was inflated and pulled snug against the web near the gastro renal shunt origin. Balloon occluded venography was then performed. The gastro renal shunt is quite massive. At least 2 large draining veins are identified escaping the gastro renal shunt and draining into the systemic system. These appear to represent the inferior phrenic vein and a second accessory inferior phrenic vein. Coil embolization of these non bleeding vessels will likely  be required in order to obtain stasis within the recently hemorrhaged gastric varices. A penumbra lantern microcatheter was advanced coaxially through the balloon occlusion catheter over a 0.016 fathom wire. The catheter was successfully used to select the inferior phrenic vein. The catheter was advanced into the inferior phrenic vein and venography was performed confirming catheter location. The inferior phrenic vein does drain into the systemic symptom and provide an outlet from the gastro renal shunt. Coil embolization was then performed using a series of 3 and 4 mm soft detachable Ruby coils. The microcatheter was brought back into the main body of the gastro renal shunt. The catheter was carefully advanced deeper into the gastro renal shunt and then used to successfully catheterize the next major escape vessel. The catheter was advanced into the vessel and venography performed. This appears to be an accessory inferior phrenic vein which drains into the azygos and hemi azygous system. This vessel must be sacrificed in order to proceed with obliteration of the target gastric varices. Therefore, coil embolization was performed utilizing a combination of 3, 4 and 5 mm Ruby detachable coils. The microcatheter was then brought back into the gastro renal shunt. Additional venography was performed, this time with carbon dioxide gas. The carbon dioxide gas successfully opacifies the entirety of the gastric variceal network. The afferent vein from the portal venous system appears to be the posterior gastric vein. Sclerosis and mixture was then prepared in the standard fashion (3: 2 : 1 mixture of air : 3% sodium tetradecyl Sulfate : Lipiodol). The sclerosis mixture was then instilled in aliquots through the lantern microcatheter filling the gastro renal shunt and ultimately the gastric varices. Injection was stopped when sclerosis and began to spill into the posterior gastric vein. Approximately 10 mL 3% sodium  tetradecyl Sulfate and 4 mL Lipiodol were administered. The microcatheter was then brought back into the proximal gastro renal shunt just beyond the occlusion balloon. Coil embolization of the proximal gastro renal shunt was then performed utilizing numerous 18, 16, 14 mm detachable Ruby microcoils as well as multiple liquid metal packing coils. Once a dense metal plug was successfully created, the occlusion balloon was slowly deflated under real-time fluoroscopic imaging. The sclerosing mass and coil pack were stable. No  evidence of mobilization or flow. The microcatheter was removed. The occlusion balloon was brought back into the left renal vein. Left renal venography was performed confirming persistent patency of the renal vein. No evidence of complication. The sheath and occlusion balloon were removed. Hemostasis was attained with the assistance of manual pressure. IMPRESSION: 1. Successful coil embolization of non bleeding accessory portosystemic collaterals (inferior phrenic an accessory inferior phrenic veins) which was required to successfully treated the bleeding gastric varices. 2. Successful coil assisted balloon occluded transvenous obliteration (CA-BRTO) of gastric varices using liquid sclerosant and detachable coils. PLAN: 1. Maintain bed rest overnight. Clear liquids are okay. Advanced diet as tolerated beginning tomorrow if patient remains stable. 2. BRTO protocol CT scan of the abdomen on Tuesday. 3. Patient will require outpatient follow-up EGD in approximately 6 weeks to assess for new or progressive esophageal varices and to assess the treated gastric varices. 4. Follow-up with Interventional Radiology in 3 months. Signed, Sterling Big, MD, RPVI [facility] Radiology Electronically Signed   By: Malachy Moan M.D.   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 He presents today for balloon occluded transvenous obliteration of gastric varices. EXAM: 1. Ultrasound-guided access right internal jugular vein 2. Catheterization and venogram, left renal vein 3. Catheterization and venogram of gastro renal shunt 4. Catheterization and venogram of left inferior phrenic vein 5. Coil embolization of left inferior phrenic vein 6. Catheterization of venogram of accessory left inferior phrenic vein 7. Coil embolization of accessory left inferior phrenic vein 8. Embolization of gastric varices and gastro renal shunt MEDICATIONS: None. ANESTHESIA/SEDATION: 2 mg Versed and 100 mcg fentanyl were administered intravenously. Moderate Sedation Time:  64 minutes The patient was continuously monitored during the procedure by the interventional radiology nurse under my direct supervision. CONTRAST:  120 mL Omnipaque 350 FLUOROSCOPY TIME:  Fluoroscopy Time: 40 minutes 30 seconds (1472 mGy). COMPLICATIONS: None immediate. PROCEDURE: Informed written consent was obtained from the patient after a thorough discussion of the procedural risks, benefits and alternatives. All questions were addressed. Maximal Sterile Barrier Technique was utilized including caps, mask, sterile gowns, sterile gloves, sterile drape, hand hygiene and skin antiseptic. A timeout was performed prior to the initiation of the procedure. The right internal jugular vein was interrogated with ultrasound and found to be widely patent. An image was obtained and stored for the medical record. Local anesthesia was attained by infiltration with 1% lidocaine. A small dermatotomy was made. Under real-time sonographic guidance, the vessel was punctured with a 21 gauge micropuncture needle. Using standard technique, the initial micro  needle was exchanged over a 0.018 micro wire for a transitional 4 [place] micro sheath. The micro sheath was then exchanged over a 0.035 wire for a 10 French fascial dilator and the soft tissue tract was dilated. Next, a 10 French tips sheath was carefully advanced over the wire and into the inferior vena cava. An angled 5 French catheter was then advanced over the wire. The catheter was used to select the left renal vein. A left renal venogram was performed. Standard venous anatomy. No evidence of left renal venous thrombus. A rose in wire was parked in an inferior branch of the left renal vein. The tips sheath was  then advanced into the origin of the renal vein. Additional left renal venography was performed. The inflow of the gastro renal shunt was successfully identified along the superior margin of the left renal vein. The angled catheter was introduced through the tips sheath coaxially adjacent to the stabilizing Rosen wire. Utilizing a Glidewire, the catheter was successfully advanced into proximal left gastro renal shunt. Venography was again performed. The renal shunt is robust. The expected web near the origin was identified. Flow direction is toward the renal vein. The Glidewire and catheter were advanced deeper into the gastro renal shunt. The glidewire was exchanged for a superstiff Amplatz wire. The 5 French catheter was removed. Next, attempts were made to advance several occlusion balloons into the gastro renal shunt. Ultimately, a 34 French Merci balloon occlusion catheter was successfully advanced into the gastro renal shunt. The occlusion balloon was inflated and pulled snug against the web near the gastro renal shunt origin. Balloon occluded venography was then performed. The gastro renal shunt is quite massive. At least 2 large draining veins are identified escaping the gastro renal shunt and draining into the systemic system. These appear to represent the inferior phrenic vein and a second  accessory inferior phrenic vein. Coil embolization of these non bleeding vessels will likely be required in order to obtain stasis within the recently hemorrhaged gastric varices. A penumbra lantern microcatheter was advanced coaxially through the balloon occlusion catheter over a 0.016 fathom wire. The catheter was successfully used to select the inferior phrenic vein. The catheter was advanced into the inferior phrenic vein and venography was performed confirming catheter location. The inferior phrenic vein does drain into the systemic symptom and provide an outlet from the gastro renal shunt. Coil embolization was then performed using a series of 3 and 4 mm soft detachable Ruby coils. The microcatheter was brought back into the main body of the gastro renal shunt. The catheter was carefully advanced deeper into the gastro renal shunt and then used to successfully catheterize the next major escape vessel. The catheter was advanced into the vessel and venography performed. This appears to be an accessory inferior phrenic vein which drains into the azygos and hemi azygous system. This vessel must be sacrificed in order to proceed with obliteration of the target gastric varices. Therefore, coil embolization was performed utilizing a combination of 3, 4 and 5 mm Ruby detachable coils. The microcatheter was then brought back into the gastro renal shunt. Additional venography was performed, this time with carbon dioxide gas. The carbon dioxide gas successfully opacifies the entirety of the gastric variceal network. The afferent vein from the portal venous system appears to be the posterior gastric vein. Sclerosis and mixture was then prepared in the standard fashion (3: 2 : 1 mixture of air : 3% sodium tetradecyl Sulfate : Lipiodol). The sclerosis mixture was then instilled in aliquots through the lantern microcatheter filling the gastro renal shunt and ultimately the gastric varices. Injection was stopped when  sclerosis and began to spill into the posterior gastric vein. Approximately 10 mL 3% sodium tetradecyl Sulfate and 4 mL Lipiodol were administered. The microcatheter was then brought back into the proximal gastro renal shunt just beyond the occlusion balloon. Coil embolization of the proximal gastro renal shunt was then performed utilizing numerous 18, 16, 14 mm detachable Ruby microcoils as well as multiple liquid metal packing coils. Once a dense metal plug was successfully created, the occlusion balloon was slowly deflated under real-time fluoroscopic imaging. The sclerosing mass and coil  pack were stable. No evidence of mobilization or flow. The microcatheter was removed. The occlusion balloon was brought back into the left renal vein. Left renal venography was performed confirming persistent patency of the renal vein. No evidence of complication. The sheath and occlusion balloon were removed. Hemostasis was attained with the assistance of manual pressure. IMPRESSION: 1. Successful coil embolization of non bleeding accessory portosystemic collaterals (inferior phrenic an accessory inferior phrenic veins) which was required to successfully treated the bleeding gastric varices. 2. Successful coil assisted balloon occluded transvenous obliteration (CA-BRTO) of gastric varices using liquid sclerosant and detachable coils. PLAN: 1. Maintain bed rest overnight. Clear liquids are okay. Advanced diet as tolerated beginning tomorrow if patient remains stable. 2. BRTO protocol CT scan of the abdomen on Tuesday. 3. Patient will require outpatient follow-up EGD in approximately 6 weeks to assess for new or progressive esophageal varices and to assess the treated gastric varices. 4. Follow-up with Interventional Radiology in 3 months. Signed, Sterling Big, MD, RPVI [facility]iology [facility] Radiology Electronically Signed   By: Malachy Moan M.D.   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 He presents today for balloon occluded transvenous obliteration of gastric varices. EXAM: 1. Ultrasound-guided access right internal jugular vein 2. Catheterization and venogram, left renal vein 3. Catheterization and venogram of gastro renal shunt 4. Catheterization and venogram of left inferior phrenic vein 5. Coil embolization of left inferior phrenic vein 6. Catheterization of venogram of accessory left inferior phrenic vein 7. Coil embolization of accessory left inferior phrenic vein 8. Embolization of gastric varices and gastro renal shunt MEDICATIONS: None. ANESTHESIA/SEDATION: 2 mg Versed and 100 mcg fentanyl were administered intravenously. Moderate Sedation Time:  64 minutes The patient was continuously monitored during the procedure by the interventional radiology nurse under my direct supervision. CONTRAST:  120 mL Omnipaque 350 FLUOROSCOPY TIME:  Fluoroscopy Time: 40 minutes 30 seconds (1472 mGy). COMPLICATIONS: None immediate. PROCEDURE: Informed written consent was obtained from the patient after a thorough discussion of the procedural risks, benefits and alternatives. All questions were addressed. Maximal Sterile Barrier Technique was utilized including caps, mask, sterile gowns, sterile gloves, sterile drape, hand hygiene and skin antiseptic. A timeout was performed prior to the initiation of the procedure. The right internal jugular vein was interrogated with ultrasound and found to be widely patent. An image was obtained and stored for the medical record. Local anesthesia was attained by infiltration with 1% lidocaine. A small dermatotomy was made. Under real-time sonographic guidance, the vessel  was punctured with a 21 gauge micropuncture needle. Using standard technique, the initial micro needle was exchanged over a 0.018 micro wire for a transitional 4 [place] micro sheath. The micro sheath was then exchanged over a 0.035 wire for a 10 French fascial dilator and the soft tissue tract was dilated. Next, a 10 French tips sheath was carefully advanced over the wire and into the inferior vena cava. An angled 5 French catheter was then advanced over the wire. The catheter was used to select the left renal vein. A left renal venogram was performed. Standard venous anatomy. No evidence of left renal venous thrombus. A rose in wire was parked in an inferior branch of the left  renal vein. The tips sheath was then advanced into the origin of the renal vein. Additional left renal venography was performed. The inflow of the gastro renal shunt was successfully identified along the superior margin of the left renal vein. The angled catheter was introduced through the tips sheath coaxially adjacent to the stabilizing Rosen wire. Utilizing a Glidewire, the catheter was successfully advanced into proximal left gastro renal shunt. Venography was again performed. The renal shunt is robust. The expected web near the origin was identified. Flow direction is toward the renal vein. The Glidewire and catheter were advanced deeper into the gastro renal shunt. The glidewire was exchanged for a superstiff Amplatz wire. The 5 French catheter was removed. Next, attempts were made to advance several occlusion balloons into the gastro renal shunt. Ultimately, a 34 French Merci balloon occlusion catheter was successfully advanced into the gastro renal shunt. The occlusion balloon was inflated and pulled snug against the web near the gastro renal shunt origin. Balloon occluded venography was then performed. The gastro renal shunt is quite massive. At least 2 large draining veins are identified escaping the gastro renal shunt and draining  into the systemic system. These appear to represent the inferior phrenic vein and a second accessory inferior phrenic vein. Coil embolization of these non bleeding vessels will likely be required in order to obtain stasis within the recently hemorrhaged gastric varices. A penumbra lantern microcatheter was advanced coaxially through the balloon occlusion catheter over a 0.016 fathom wire. The catheter was successfully used to select the inferior phrenic vein. The catheter was advanced into the inferior phrenic vein and venography was performed confirming catheter location. The inferior phrenic vein does drain into the systemic symptom and provide an outlet from the gastro renal shunt. Coil embolization was then performed using a series of 3 and 4 mm soft detachable Ruby coils. The microcatheter was brought back into the main body of the gastro renal shunt. The catheter was carefully advanced deeper into the gastro renal shunt and then used to successfully catheterize the next major escape vessel. The catheter was advanced into the vessel and venography performed. This appears to be an accessory inferior phrenic vein which drains into the azygos and hemi azygous system. This vessel must be sacrificed in order to proceed with obliteration of the target gastric varices. Therefore, coil embolization was performed utilizing a combination of 3, 4 and 5 mm Ruby detachable coils. The microcatheter was then brought back into the gastro renal shunt. Additional venography was performed, this time with carbon dioxide gas. The carbon dioxide gas successfully opacifies the entirety of the gastric variceal network. The afferent vein from the portal venous system appears to be the posterior gastric vein. Sclerosis and mixture was then prepared in the standard fashion (3: 2 : 1 mixture of air : 3% sodium tetradecyl Sulfate : Lipiodol). The sclerosis mixture was then instilled in aliquots through the lantern microcatheter filling the  gastro renal shunt and ultimately the gastric varices. Injection was stopped when sclerosis and began to spill into the posterior gastric vein. Approximately 10 mL 3% sodium tetradecyl Sulfate and 4 mL Lipiodol were administered. The microcatheter was then brought back into the proximal gastro renal shunt just beyond the occlusion balloon. Coil embolization of the proximal gastro renal shunt was then performed utilizing numerous 18, 16, 14 mm detachable Ruby microcoils as well as multiple liquid metal packing coils. Once a dense metal plug was successfully created, the occlusion balloon was slowly deflated under real-time fluoroscopic  imaging. The sclerosing mass and coil pack were stable. No evidence of mobilization or flow. The microcatheter was removed. The occlusion balloon was brought back into the left renal vein. Left renal venography was performed confirming persistent patency of the renal vein. No evidence of complication. The sheath and occlusion balloon were removed. Hemostasis was attained with the assistance of manual pressure. IMPRESSION: 1. Successful coil embolization of non bleeding accessory portosystemic collaterals (inferior phrenic an accessory inferior phrenic veins) which was required to successfully treated the bleeding gastric varices. 2. Successful coil assisted balloon occluded transvenous obliteration (CA-BRTO) of gastric varices using liquid sclerosant and detachable coils. PLAN: 1. Maintain bed rest overnight. Clear liquids are okay. Advanced diet as tolerated beginning tomorrow if patient remains stable. 2. BRTO protocol CT scan of the abdomen on Tuesday. 3. Patient will require outpatient follow-up EGD in approximately 6 weeks to assess for new or progressive esophageal varices and to assess the treated gastric varices. 4. Follow-up with Interventional Radiology in 3 months. Signed, Sterling Big, MD, RPVI [facility]iology [facility] Radiology  Electronically Signed   By: Malachy Moan M.D.   On: 04/20/2020 15:16   IR Paracentesis  Result Date: 04/21/2020 INDICATION: Patient with a history of hepatic cirrhosis, portal hypertension and gastric varices. Patient is status post BRTO April 20, 2020. Patient presents today for a therapeutic and diagnostic paracentesis. EXAM: ULTRASOUND GUIDED PARACENTESIS MEDICATIONS: 1% lidocaine 10 mL COMPLICATIONS: None immediate. PROCEDURE: Informed written consent was obtained from the patient after a discussion of the risks, benefits and alternatives to treatment. A timeout was performed prior to the initiation of the procedure. Initial ultrasound scanning demonstrates a large amount of ascites within the right lower abdominal quadrant. The right lower abdomen was prepped and draped in the usual sterile fashion. 1% lidocaine was used for local anesthesia. Following this, a 19 gauge, 7-cm, Yueh catheter was introduced. An ultrasound image was saved for documentation purposes. The paracentesis was performed. The catheter was removed and a dressing was applied. The patient tolerated the procedure well without immediate post procedural complication. FINDINGS: A total of approximately 3.3 L of clear yellow fluid was removed. Samples were sent to the laboratory as requested by the clinical team. IMPRESSION: Successful ultrasound-guided paracentesis yielding 3.3 liters of peritoneal fluid. Read by: Alwyn Ren, NP Electronically Signed   By: Marliss Coots MD   On: 04/21/2020 14:07   Scheduled Meds: . lidocaine      . pantoprazole  40 mg Intravenous Q12H  . tamsulosin  0.4 mg Oral QPC breakfast   Continuous Infusions: . albumin human 25 g (04/21/20 1415)  . cefTRIAXone (ROCEPHIN)  IV 2 g (04/20/20 2209)  . octreotide  (SANDOSTATIN)    IV infusion 50 mcg/hr (04/21/20 1518)    LOS: 3 days   Time spent:  Azucena Fallen, DO [facility]  If 7PM-7AM, please contact  night-coverage [email or web address]  04/21/2020, 6:13 PM

## 2020-04-21 NOTE — Progress Notes (Signed)
Inpatient Rehabilitation Admissions Coordinator  Inpatient rehab consult received. I await further progress with therapy to assist with planning rehab venue options.  Ottie Glazier, RN, MSN Rehab Admissions Coordinator [phone] 04/21/2020 2:10 PM

## 2020-04-22 ENCOUNTER — Inpatient Hospital Stay (HOSPITAL_COMMUNITY): Payer: Medicare Other

## 2020-04-22 ENCOUNTER — Encounter (HOSPITAL_COMMUNITY): Payer: Self-pay

## 2020-04-22 DIAGNOSIS — R601 Generalized edema: Secondary | ICD-10-CM

## 2020-04-22 LAB — COMPREHENSIVE METABOLIC PANEL
ALT: 27 U/L (ref 0–44)
AST: 39 U/L (ref 15–41)
Albumin: 2.4 g/dL — ABNORMAL LOW (ref 3.5–5.0)
Alkaline Phosphatase: 90 U/L (ref 38–126)
Anion gap: 7 (ref 5–15)
BUN: 24 mg/dL — ABNORMAL HIGH (ref 8–23)
CO2: 21 mmol/L — ABNORMAL LOW (ref 22–32)
Calcium: 7.9 mg/dL — ABNORMAL LOW (ref 8.9–10.3)
Chloride: 109 mmol/L (ref 98–111)
Creatinine, Ser: 0.93 mg/dL (ref 0.61–1.24)
GFR, Estimated: >60 mL/min (ref >60)
Glucose, Bld: 191 mg/dL — ABNORMAL HIGH (ref 70–99)
Potassium: 3.5 mmol/L (ref 3.5–5.1)
Sodium: 137 mmol/L (ref 135–145)
Total Bilirubin: 1 mg/dL (ref 0.3–1.2)
Total Protein: 5.5 g/dL — ABNORMAL LOW (ref 6.5–8.1)

## 2020-04-22 LAB — TYPE AND SCREEN
ABO/RH(D): A POS
Antibody Screen: NEGATIVE
Unit division: 0
Unit division: 0
Unit division: 0
Unit division: 0
Unit division: 0

## 2020-04-22 LAB — BPAM RBC
Blood Product Expiration Date: 202202212359
Blood Product Expiration Date: 202202212359
Blood Product Expiration Date: 202202262359
Blood Product Expiration Date: 202202262359
Blood Product Expiration Date: 202202262359
ISSUE DATE / TIME: 202201272026
ISSUE DATE / TIME: 202201280915
ISSUE DATE / TIME: 202201281350
Unit Type and Rh: 6200
Unit Type and Rh: 6200
Unit Type and Rh: 6200
Unit Type and Rh: 6200
Unit Type and Rh: 6200

## 2020-04-22 LAB — CBC
HCT: 24.6 % — ABNORMAL LOW (ref 39.0–52.0)
Hemoglobin: 7.6 g/dL — ABNORMAL LOW (ref 13.0–17.0)
MCH: 31.3 pg (ref 26.0–34.0)
MCHC: 30.9 g/dL (ref 30.0–36.0)
MCV: 101.2 fL — ABNORMAL HIGH (ref 80.0–100.0)
Platelets: 90 10*3/uL — ABNORMAL LOW (ref 150–400)
RBC: 2.43 MIL/uL — ABNORMAL LOW (ref 4.22–5.81)
RDW: 19.2 % — ABNORMAL HIGH (ref 11.5–15.5)
WBC: 8.1 10*3/uL (ref 4.0–10.5)
nRBC: 0.2 % (ref 0.0–0.2)

## 2020-04-22 LAB — PROTEIN, BODY FLUID (OTHER): Total Protein, Body Fluid Other: 0.6 g/dL

## 2020-04-22 LAB — CULTURE, BLOOD (ROUTINE X 2)
Culture: NO GROWTH
Culture: NO GROWTH
Special Requests: ADEQUATE

## 2020-04-22 LAB — HEPATITIS A ANTIBODY, TOTAL: hep A Total Ab: NONREACTIVE

## 2020-04-22 LAB — HEPATITIS B CORE ANTIBODY, TOTAL: Hep B Core Total Ab: NONREACTIVE

## 2020-04-22 MED ORDER — IOHEXOL 350 MG/ML SOLN
100.0000 mL | Freq: Once | INTRAVENOUS | Status: AC | PRN
  Administered 2020-04-22: 100 mL via INTRAVENOUS

## 2020-04-22 MED ORDER — PANTOPRAZOLE SODIUM 40 MG PO TBEC
40.0000 mg | DELAYED_RELEASE_TABLET | Freq: Two times a day (BID) | ORAL | Status: DC
  Administered 2020-04-22 – 2020-04-27 (×10): 40 mg via ORAL
  Filled 2020-04-22 (×10): qty 1

## 2020-04-22 NOTE — Progress Notes (Signed)
Daily Rounding Note  04/22/2020, 10:55 AM  LOS: 4 days   SUBJECTIVE:   Chief complaint:  Gastric varices, GI bleed.  S/p BRTO.    No stools or bleeding reported to current RN from previous shift.  Current RN has not seen any bowel movements.  Patient is eating pured diet. I see orders for a CT angio abdomen pelvis for follow-up.   OBJECTIVE:         Vital signs in last 24 hours:    Temp:  [97.6 F (36.4 C)-98.3 F (36.8 C)] 98.2 F (36.8 C) (02/01 0748) Pulse Rate:  [77-85] 81 (02/01 0748) Resp:  [15-20] 18 (02/01 0748) BP: (109-131)/(63-70) 119/63 (02/01 0748) SpO2:  [97 %-100 %] 100 % (02/01 0748) Weight:  [87.4 kg] 87.4 kg (02/01 0647) Last BM Date: 04/19/20 Filed Weights   04/20/20 0656 04/21/20 0500 04/22/20 0647  Weight: 90 kg 93.4 kg 87.4 kg   General: Frail, looks somewhat chronically ill but alert, comfortable, conversant. Heart: RRR. Chest: Clear bilaterally.  No coughing while eating. Abdomen: Soft, less distended without tenderness.  Active bowel sounds. Bil inguinal hernias remain massive.   Extremities: + LE edema.    Neuro/Psych: Alert.  Appropriate.  Follows commands.  No tremors or gross deficits.  Intake/Output from previous day: 01/31 0701 - 02/01 0700 In: -  Out: 3300   Intake/Output this shift: No intake/output data recorded.  Lab Results: Recent Labs    04/20/20 0308 04/21/20 0136 04/22/20 0114  WBC 7.8 8.9 8.1  HGB 8.1* 8.4* 7.6*  HCT 24.2* 25.4* 24.6*  PLT 109* 100* 90*   BMET Recent Labs    04/20/20 0308 04/21/20 0136 04/22/20 0114  NA 145 141 137  K 3.7 3.7 3.5  CL 114* 112* 109  CO2 22 19* 21*  GLUCOSE 136* 163* 191*  BUN 45* 35* 24*  CREATININE 1.20 1.06 0.93  CALCIUM 8.1* 7.9* 7.9*   LFT Recent Labs    04/22/20 0114  PROT 5.5*  ALBUMIN 2.4*  AST 39  ALT 27  ALKPHOS 90  BILITOT 1.0   PT/INR No results for input(s): LABPROT, INR in the last 72  hours. Hepatitis Panel No results for input(s): HEPBSAG, HCVAB, HEPAIGM, HEPBIGM in the last 72 hours.  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 He presents today for balloon occluded transvenous obliteration of gastric varices. EXAM: 1. Ultrasound-guided access right internal jugular vein 2. Catheterization and venogram, left renal vein 3. Catheterization and venogram of gastro renal shunt 4. Catheterization and venogram of left inferior phrenic vein 5. Coil embolization of left inferior phrenic vein 6. Catheterization of venogram of accessory left inferior phrenic vein 7. Coil embolization of accessory left inferior phrenic vein 8. Embolization of gastric varices and gastro renal shunt MEDICATIONS: None. ANESTHESIA/SEDATION: 2 mg Versed and 100 mcg fentanyl were administered intravenously. Moderate Sedation Time:  64 minutes The patient was continuously monitored during the procedure by the interventional radiology nurse under my direct supervision. CONTRAST:  120 mL Omnipaque 350 FLUOROSCOPY TIME:  Fluoroscopy Time: 40 minutes 30 seconds (1472 mGy). COMPLICATIONS: None immediate. PROCEDURE: Informed written consent was obtained from the patient after a thorough discussion of the procedural risks, benefits and alternatives. All questions were addressed. Maximal Sterile  Barrier Technique was utilized including caps, mask, sterile gowns, sterile gloves, sterile drape, hand hygiene and skin antiseptic. A timeout was performed prior to the initiation of the procedure. The right internal jugular vein was interrogated with ultrasound and found to be widely patent. An image was obtained and stored for the medical record. Local anesthesia was  attained by infiltration with 1% lidocaine. A small dermatotomy was made. Under real-time sonographic guidance, the vessel was punctured with a 21 gauge micropuncture needle. Using standard technique, the initial micro needle was exchanged over a 0.018 micro wire for a transitional 4 [place] micro sheath. The micro sheath was then exchanged over a 0.035 wire for a 10 French fascial dilator and the soft tissue tract was dilated. Next, a 87 French tips sheath was carefully advanced over the wire and into the inferior vena cava. An angled 5 French catheter was then advanced over the wire. The catheter was used to select the left renal vein. A left renal venogram was performed. Standard venous anatomy. No evidence of left renal venous thrombus. A rose in wire was parked in an inferior branch of the left renal vein. The tips sheath was then advanced into the origin of the renal vein. Additional left renal venography was performed. The inflow of the gastro renal shunt was successfully identified along the superior margin of the left renal vein. The angled catheter was introduced through the tips sheath coaxially adjacent to the stabilizing Rosen wire. Utilizing a Glidewire, the catheter was successfully advanced into proximal left gastro renal shunt. Venography was again performed. The renal shunt is robust. The expected web near the origin was identified. Flow direction is toward the renal vein. The Glidewire and catheter were advanced deeper into the gastro renal shunt. The glidewire was exchanged for a superstiff Amplatz wire. The 5 French catheter was removed. Next, attempts were made to advance several occlusion balloons into the gastro renal shunt. Ultimately, a 56 French Merci balloon occlusion catheter was successfully advanced into the gastro renal shunt. The occlusion balloon was inflated and pulled snug against the web near the gastro renal shunt origin. Balloon occluded venography was then performed. The gastro  renal shunt is quite massive. At least 2 large draining veins are identified escaping the gastro renal shunt and draining into the systemic system. These appear to represent the inferior phrenic vein and a second accessory inferior phrenic vein. Coil embolization of these non bleeding vessels will likely be required in order to obtain stasis within the recently hemorrhaged gastric varices. A penumbra lantern microcatheter was advanced coaxially through the balloon occlusion catheter over a 0.016 fathom wire. The catheter was successfully used to select the inferior phrenic vein. The catheter was advanced into the inferior phrenic vein and venography was performed confirming catheter location. The inferior phrenic vein does drain into the systemic symptom and provide an outlet from the gastro renal shunt. Coil embolization was then performed using a series of 3 and 4 mm soft detachable Ruby coils. The microcatheter was brought back into the main body of the gastro renal shunt. The catheter was carefully advanced deeper into the gastro renal shunt and then used to successfully catheterize the next major escape vessel. The catheter was advanced into the vessel and venography performed. This appears to be an accessory inferior phrenic vein which drains into the azygos and hemi azygous system. This vessel must be sacrificed in order to proceed with obliteration of the target gastric varices. Therefore, coil embolization was performed utilizing a  combination of 3, 4 and 5 mm Ruby detachable coils. The microcatheter was then brought back into the gastro renal shunt. Additional venography was performed, this time with carbon dioxide gas. The carbon dioxide gas successfully opacifies the entirety of the gastric variceal network. The afferent vein from the portal venous system appears to be the posterior gastric vein. Sclerosis and mixture was then prepared in the standard fashion (3: 2 : 1 mixture of air : 3% sodium  tetradecyl Sulfate : Lipiodol). The sclerosis mixture was then instilled in aliquots through the lantern microcatheter filling the gastro renal shunt and ultimately the gastric varices. Injection was stopped when sclerosis and began to spill into the posterior gastric vein. Approximately 10 mL 3% sodium tetradecyl Sulfate and 4 mL Lipiodol were administered. The microcatheter was then brought back into the proximal gastro renal shunt just beyond the occlusion balloon. Coil embolization of the proximal gastro renal shunt was then performed utilizing numerous 18, 16, 14 mm detachable Ruby microcoils as well as multiple liquid metal packing coils. Once a dense metal plug was successfully created, the occlusion balloon was slowly deflated under real-time fluoroscopic imaging. The sclerosing mass and coil pack were stable. No evidence of mobilization or flow. The microcatheter was removed. The occlusion balloon was brought back into the left renal vein. Left renal venography was performed confirming persistent patency of the renal vein. No evidence of complication. The sheath and occlusion balloon were removed. Hemostasis was attained with the assistance of manual pressure. IMPRESSION: 1. Successful coil embolization of non bleeding accessory portosystemic collaterals (inferior phrenic an accessory inferior phrenic veins) which was required to successfully treated the bleeding gastric varices. 2. Successful coil assisted balloon occluded transvenous obliteration (CA-BRTO) of gastric varices using liquid sclerosant and detachable coils. PLAN: 1. Maintain bed rest overnight. Clear liquids are okay. Advanced diet as tolerated beginning tomorrow if patient remains stable. 2. BRTO protocol CT scan of the abdomen on Tuesday. 3. Patient will require outpatient follow-up EGD in approximately 6 weeks to assess for new or progressive esophageal varices and to assess the treated gastric varices. 4. Follow-up with Interventional  Radiology in 3 months. Signed, Criselda Peaches, MD, [facility] Radiology Electronically Signed   By: Jacqulynn Cadet M.D.   On: 04/20/2020 15:16       IR Paracentesis  Result Date: 04/21/2020 INDICATION: Patient with a history of hepatic cirrhosis, portal hypertension and gastric varices. Patient is status post BRTO April 20, 2020. Patient presents today for a therapeutic and diagnostic paracentesis. EXAM: ULTRASOUND GUIDED PARACENTESIS MEDICATIONS: 1% lidocaine 10 mL COMPLICATIONS: None immediate. PROCEDURE: Informed written consent was obtained from the patient after a discussion of the risks, benefits and alternatives to treatment. A timeout was performed prior to the initiation of the procedure. Initial ultrasound scanning demonstrates a large amount of ascites within the right lower abdominal quadrant. The right lower abdomen was prepped and draped in the usual sterile fashion. 1% lidocaine was used for local anesthesia. Following this, a 19 gauge, 7-cm, Yueh catheter was introduced. An ultrasound image was saved for documentation purposes. The paracentesis was performed. The catheter was removed and a dressing was applied. The patient tolerated the procedure well without immediate post procedural complication. FINDINGS: A total of approximately 3.3 L of clear yellow fluid was removed. Samples were sent to the laboratory as requested by the clinical team. IMPRESSION: Successful ultrasound-guided paracentesis yielding 3.3 liters of peritoneal fluid. Read by: Soyla Dryer, NP Electronically Signed  By: Ruthann Cancer MD   On: 04/21/2020 14:07    ASSESMENT:   *   GI bleed with melena, blood loss anemia.  Known gastric varices. 04/17/20 CTAP: Cirrhosis, portal hypertension with moderate ascites, large gastric varices with gastric renal shunt. Patent portal and splenic veins. 04/18/2020 EGD (Dr Andrey Farmer at [facility]):  large GOV2  gastric varices, nonbleeding but stigmata of recent bleeding present.  Small, nonbleeding, grade 1 esophageal varices. Blood in the duodenal bulb and second duodenum. 04/20/20 BRTO by Dr. Jacqulynn Cadet Completed 72 hours of octreotide at 330 this morning.  2200.  Protonix x 4 days: infusion >> Protonix 40 IV bid.   No prior EGD/colonoscopy before 04/17/20.  *   Cirrhosis of the liver. Hepatitis serologies negative now and in 2015.   No signif alcohol use, though CTAP of 2015 (for elevated LFTs particularly alk phos.) he had not only large inguinal hernias but changes of chronic pancreatitis and small cystic lesion at the tail of the pancreas possibly a pseudocyst, liver appeared  Normal.  In 2015 significantly elevated IgG and IgA.  ANA, mitochondrial Abs, A1AT, liver kidney microsomal Ab were all  WNL 08/2013.  Dr. Benson Norway suspected drug reaction Wonder if cirrhosis is actually sequela of autoimmune hepatitis?     *    Blood loss anemia. 3 PRBCs at [facility]. None since arrival to [facility].   Hgb 5.7 >> 8.4 >> 7.6.   10.6 in July 2021.    *   Thrombocytopenia. Chronic. Platelets 191 >> 90.    *   Coagulopathy.  INR 1.6 on 1/27.    *    Ascites. Cephtriaxone in place.   04/18/2020 60 mL diagnostic paracentesis, negative for SBP. 04/21/2020 3.3 L tap.  Fluid albumin less than 1.  Fluid protein less than 3. SAAG calculated using fluid from yest and blood from this AM is ~1.5 consistent with portal hypertension.  *    Massive bilateral inguinal hernias containing large volume of ascites.  Seen on 1/27 CT.   No change in appearance post paracentesis.  These large hernias are chronic and date back to least 2015.    *    Stable pancreatic cyst.  *   DM 2, insulin PTA.    *   L pleural effusion.    *   NSTEMI in setting blood loss anemia.    *   Hearing loss.      PLAN   *   Recheck CBC in AM.  Pending labs include ANA, smooth muscle IgG, ceruloplasmin, mitochondrial Abs.    *   CTAP  with angio planned for this afternoon to reassess vertigo.  I will make him n.p.o. for this study but resume his carb modified diet after the CT.  *    Finish Rocephin on 2/4, this is 5 days from the [place] of 1/30.  *     Switch to Protonix 40 mg po bid.       Elijah [name]  04/22/2020, 10:55 AM Phone [phone]

## 2020-04-22 NOTE — Progress Notes (Signed)
Inpatient Rehabilitation Admissions Coordinator  Inpatient rehab consult received. I met with patient at  bedside . We discussed goals and expectations of a possible [facility] admit. He is in agreement. I await OT eval which I have ordered today, to begin insurance authorization with [place] for a possible admit pending insurance approval.  Danne Baxter, RN, MSN Rehab Admissions Coordinator [phone] 04/22/2020 1:18 PM

## 2020-04-22 NOTE — Progress Notes (Addendum)
Physical Therapy Treatment Patient Details Name: Elijah [name] MRN: [id number] DOB: 01/31/1934 Today's Date: 04/22/2020    History of Present Illness 87 yo male admitted Acute blood loss anemia secondary to upper GI bleed post EGD on 04/18/2020, NSTEMI in the setting of marked acute blood loss anemia, Newly diagnosed cirrhosis of the liver with large gastric varices, Newly diagnosed large ascites from cirrhosis, and Type 2 diabetes with hyperglycemia. medical history significant for CVA in 2015, MSSA bacteremia, essential hypertension, hyperlipidemia.    PT Comments    Pt received in supine, agreeable to therapy session and with good participation and tolerance for mobility. Pt making good progress toward mobility goals this date, able to initiate gait training short distance in room with chair follow for safety and needing up to minA for all mobility tasks, aside from one LOB during initial pivot to chair needing modA for stand>sit due to LOB. Reviewed supine/seated HEP handout and pt performed with good tolerance, encouraged him to perform TID. Pt reports 6/10 modified RPE (fatigue) at end of session. Pt remained up in chair at end of session with chair alarm pad activated. Pt continues to benefit from PT services to progress toward functional mobility goals. Continue to recommend CIR.  Follow Up Recommendations  CIR     Equipment Recommendations  None recommended by PT    Recommendations for Other Services       Precautions / Restrictions Precautions Precautions: Fall Restrictions Weight Bearing Restrictions: No    Mobility  Bed Mobility Overal bed mobility: Needs Assistance Bed Mobility: Rolling;Sidelying to Sit;Sit to Sidelying Rolling: Min guard Sidelying to sit: Min assist       General bed mobility comments: Min assist for bed mobility, cues for sequencing, use of rail with UEs as able. Min assist for trunk support to rise to EOB, and min assist scooting with transfer  pad assist.  Transfers Overall transfer level: Needs assistance Equipment used: Rolling walker (2 wheeled) Transfers: Sit to/from Stand Sit to Stand: Min assist;From elevated surface         General transfer comment: Min assist for boost to stand from elevated bed surface x2 reps. Fair strength, good mechanics with trunk positioning and anterior weight shift. Cues for walker and hand/foot placement (wide BOS). Pt able to stand x3 reps from chair height but needs cues each attempt for UE placement on arm rests  Ambulation/Gait Ambulation/Gait assistance: Min assist;+2 safety/equipment (chair follow) Gait Distance (Feet): 10 Feet Assistive device: Rolling walker (2 wheeled) Gait Pattern/deviations: Step-to pattern;Decreased stride length;Wide base of support;Trunk flexed (decreased foot clearance bilaterally) Gait velocity: grossly <0.2 m/s Gait velocity interpretation: <1.31 ft/sec, indicative of household ambulator General Gait Details: max encouragement to attempt; very small short steps, needs manual assist to maintain proper proximity to RW/for safety while turning, pt tending to hold RW at arm's length; very slow cadence; HR 110-118 bpm during gait trial; chair follow for safety   Stairs             Wheelchair Mobility    Modified Rankin (Stroke Patients Only)       Balance Overall balance assessment: Needs assistance Sitting-balance support: No upper extremity supported Sitting balance-Leahy Scale: Good     Standing balance support: Bilateral upper extremity supported Standing balance-Leahy Scale: Poor Standing balance comment: reliant on BUE support of RW for dynamic standing tasks and had 1 minor LOB pivoting to chair needing increased assist (modA) to sit due to line getting tangled behind him  Cognition Arousal/Alertness: Awake/alert Behavior During Therapy: WFL for tasks assessed/performed Overall Cognitive Status:  Within Functional Limits for tasks assessed                                 General Comments: slow processing at times vs hard of hearing, good following of 1-step cues and increased time to process 2-step cues      Exercises General Exercises - Lower Extremity Ankle Circles/Pumps: AROM;Both;10 reps;Supine Quad Sets: Strengthening;Both;5 reps;Seated Gluteal Sets: Strengthening;Both;5 reps;Supine Long Arc Quad: Strengthening;Both;10 reps;Seated Heel Slides: Both;10 reps;Supine;AROM Hip ABduction/ADduction: Strengthening;AAROM;Both;Supine;10 reps Hip Flexion/Marching: Strengthening;Both;Standing;Seated;20 reps (x10 reps ea position)    General Comments General comments (skin integrity, edema, etc.): HR tachy up to 118 during mobility however no s/sx distress and SpO2/RR WNL on RA      Pertinent Vitals/Pain Pain Assessment: No/denies pain Pain Score: 0-No pain Pain Intervention(s): Monitored during session;Repositioned    Home Living                      Prior Function            PT Goals (current goals can now be found in the care plan section) Acute Rehab PT Goals Patient Stated Goal: Get well, be able to eat and drink something soon. PT Goal Formulation: With patient Time For Goal Achievement: 05/03/20 Potential to Achieve Goals: Good Progress towards PT goals: Progressing toward goals    Frequency    Min 3X/week      PT Plan Current plan remains appropriate    Co-evaluation              AM-PAC PT "6 Clicks" Mobility   Outcome Measure  Help needed turning from your back to your side while in a flat bed without using bedrails?: A Little Help needed moving from lying on your back to sitting on the side of a flat bed without using bedrails?: A Little Help needed moving to and from a bed to a chair (including a wheelchair)?: A Little Help needed standing up from a chair using your arms (e.g., wheelchair or bedside chair)?: A  Little Help needed to walk in hospital room?: A Little Help needed climbing 3-5 steps with a railing? : Total 6 Click Score: 16    End of Session Equipment Utilized During Treatment: Gait belt Activity Tolerance: Patient tolerated treatment well Patient left: with call bell/phone within reach;in chair;with chair alarm set (RN notified) Nurse Communication: Mobility status PT Visit Diagnosis: Muscle weakness (generalized) (M62.81);Difficulty in walking, not elsewhere classified (R26.2)     Time: 1131-1209 PT Time Calculation (min) (ACUTE ONLY): 38 min  Charges:  $Gait Training: 8-22 mins $Therapeutic Exercise: 8-22 mins $Therapeutic Activity: 8-22 mins                     Tejay Hubert P., PTA Acute Rehabilitation Services Pager: [phone] Office: [phone]   Angus Palms 04/22/2020, 12:32 PM

## 2020-04-22 NOTE — Care Management Important Message (Signed)
Important Message  Patient Details  Name: Elijah [name] MRN: [id number] Date of Birth: February 14, 1934   Medicare Important Message Given:  Yes     Dorena Bodo 04/22/2020, 2:53 PM

## 2020-04-22 NOTE — Progress Notes (Signed)
PROGRESS NOTE    Elijah [name]  RUE:[id number] DOB: Jan 27, 1934 DOA: 04/18/2020 PCP: Jaclyn Shaggy, MD   Brief Narrative:  Elijah [name] is a 87 y.o. male with medical history significant for CVA in 2015, MSSA bacteremia, essential hypertension, hyperlipidemia, type 2 diabetes who initially presented to [facility] from home on 04/17/2020 due to 4 days of melena, weakness, fatigue, chest pain, and shortness of breath.  Work-up revealed symptomatic blood loss anemia with initial hemoglobin of 5.7K and NSTEMI.  Admitted to the ICU initially, received 3u PRBC to maintain hemoglobin.  Patient evaluated by GI, ICU, cardiology at [facility]. Currently on Protonix drip, received octreotide and Rocephin after CT angio abdomen and pelvis showed cirrhotic liver morphology with large gastric varices. EGD done on 04/18/2020 which showed very large gastric varix without any active bleeding but with evidence of recent bleeding.  Small nonbleeding superficial varices were also noted.  He also had diagnostic paracentesis, no evidence of SBP. Seen by cardiology felt that his uptrending troponins and EKG abnormalities were consistent with NSTEMI in the setting of acute blood loss anemia.  Recommended echocardiogram as well as beta-blockade when able to tolerate; nitro for symptomatic chest pain. GI recommended evaluation for TIPS or BRTO given high risk for rebleeding from the morphology of the size of the gastric varix and to transfer to [facility] for IR guided TIPS.  1/30 Echocardiogram without overt findings per IR will likely proceed with TIPS and CA-BRTO procedure later today pending schedule. 1/31 Tolerated procedure well - patient will need EGD 6 weeks; IR f/up in 3months -3.3L paracentesis  Assessment & Plan:  Newly diagnosed cirrhosis of the liver with large acutely bleeding gastric varices Associated ascites Seen by GI at [facility], transferred to [facility] for procedure given availability GI and IR following: TIPS with BRTO  04/21/20 - tolerated well. Paracentesis yesterday 3.3 L, SAAG = >1 consistent with ascites due to portal hypertension CTA abdomen for post procedure evaluation pending  Acute blood loss anemia secondary to upper GI bleed post EGD on 04/18/2020. Presented initially to [facility] ED with complaints of melena 4 days, with hemoglobin of 5.7 on presentation.  Status post 3u PRBC transfusion since admission Hgb stabilized: Lab Results  Component Value Date   HGB 7.6 (L) 04/22/2020  EGD done on 04/18/2020 showed very large gastric varix without any active bleeding but with evidence of recent bleeding.  Small nonbleeding superficial varices were also noted.  Status post TIPS/BRTO (see above) Protonix drip off; octreotide drip off 04/22/20  NSTEMI in the setting of marked acute blood loss anemia. Presented with hemoglobin of 5.7K, baseline hemoglobin 10. Troponin has peaked at greater than 3700 and downtrending appropriately. Remains asymptomatic, cardiology discussion for preoperative clearance, they concur with previous note by Dr. Juliann Pares at [facility], pending echocardiogram  Acute onset ascites from worsening cirrhosis IR following, likely require multiple paracentesis for symptom control during hospital stay  Type 2 diabetes controlled Lab Results  Component Value Date   HGBA1C 5.4 04/18/2020  Hold off home oral antiglycemic's Start insulin sliding scale and hypoglycemic protocol  Isolated elevated AST Avoid hepatotoxic agents Hold off on statin for now  BPH Resume home Flomax Monitor urine output  Physical debility/Ambulatory dysfunction PT to assess Fall precautions.  DVT prophylaxis: SCDs; avoid chemical prophylaxis given above Code Status: DNR confirmed at admission Family Communication: None available  Status is: Inpatient  Dispo: The patient is from: Home  Anticipated d/c is to: To be determined              Anticipated d/c date is: 48-72h              Patient  currently not medically stable for discharge  Consultants:   PCCM, IR, GI, cardiology  Procedures:   Upper endoscopy 04/18/2020  TIPS/BRTO pending clearance  Antimicrobials:  Ceftriaxone  Subjective: No acute issues or events overnight, patient denies nausea vomiting diarrhea constipation headache fevers or chills.  He does report moderate improvement in abdominal ascites today after paracentesis.  Objective: Vitals:   04/21/20 1940 04/22/20 0000 04/22/20 0400 04/22/20 0647  BP: 131/67 120/66 109/67   Pulse: 85 77    Resp: 20 18    Temp: 98.3 F (36.8 C) 98 F (36.7 C) 97.9 F (36.6 C)   TempSrc: Oral Oral Oral   SpO2: 98% 97%    Weight:    87.4 kg  Height:        Intake/Output Summary (Last 24 hours) at 04/22/2020 0735 Last data filed at 04/21/2020 1324 Gross per 24 hour  Intake --  Output 3300 ml  Net -3300 ml   Filed Weights   04/20/20 0656 04/21/20 0500 04/22/20 0647  Weight: 90 kg 93.4 kg 87.4 kg    Examination:  General:  Pleasantly resting in bed, No acute distress. HEENT:  Normocephalic atraumatic.  Sclerae nonicteric, noninjected.  Extraocular movements intact bilaterally. Neck:  Without mass or deformity.  Trachea is midline. Lungs:  Clear to auscultate bilaterally without rhonchi, wheeze, or rales. Heart:  Regular rate and rhythm.  Without murmurs, rubs, or gallops. Abdomen:  Soft, nontender, minimally distended.  Without guarding or rebound. Extremities: Without cyanosis, clubbing, edema, or obvious deformity. Vascular:  Dorsalis pedis and posterior tibial pulses palpable bilaterally. Skin:  Warm and dry, no erythema, no ulcerations.   Data Reviewed: I have personally reviewed following labs and imaging studies  CBC: Recent Labs  Lab 04/18/20 1436 04/18/20 1951 04/19/20 0055 04/19/20 0606 04/20/20 0308 04/21/20 0136 04/22/20 0114  WBC 10.6*  --   --  9.2 7.8 8.9 8.1  NEUTROABS 8.9*  --   --   --   --   --   --   HGB 7.2*   < > 8.6* 8.2*  8.1* 8.4* 7.6*  HCT 21.4*   < > 24.4* 23.5* 24.2* 25.4* 24.6*  MCV 97.3  --   --  96.3 97.2 99.6 101.2*  PLT 131*  --   --  123* 109* 100* 90*   < > = values in this interval not displayed.   Basic Metabolic Panel: Recent Labs  Lab 04/18/20 0515 04/19/20 0606 04/20/20 0308 04/21/20 0136 04/22/20 0114  NA 143 143 145 141 137  K 4.4 3.9 3.7 3.7 3.5  CL 111 112* 114* 112* 109  CO2 19* 22 22 19* 21*  GLUCOSE 156* 137* 136* 163* 191*  BUN 64* 53* 45* 35* 24*  CREATININE 1.11 1.21 1.20 1.06 0.93  CALCIUM 8.6* 8.3* 8.1* 7.9* 7.9*  MG 1.8  --   --   --   --   PHOS 3.9  --   --   --   --    GFR: Estimated Creatinine Clearance: 70.7 mL/min (by C-G formula based on SCr of 0.93 mg/dL). Liver Function Tests: Recent Labs  Lab 04/17/20 1419 04/18/20 0515 04/22/20 0114  AST 63* 60* 39  ALT 33 31 27  ALKPHOS 110 93 90  BILITOT 1.1 0.8 1.0  PROT 6.5 5.8* 5.5*  ALBUMIN 2.5* 2.4* 2.4*   No results for input(s): LIPASE, AMYLASE in the last 168 hours. Recent Labs  Lab 04/18/20 0500  AMMONIA 65*   Coagulation Profile: Recent Labs  Lab 04/17/20 1714  INR 1.6*   Cardiac Enzymes: No results for input(s): CKTOTAL, CKMB, CKMBINDEX, TROPONINI in the last 168 hours. BNP (last 3 results) No results for input(s): PROBNP in the last 8760 hours. HbA1C: No results for input(s): HGBA1C in the last 72 hours. CBG: Recent Labs  Lab 04/18/20 0644 04/18/20 1303  GLUCAP 147* 136*   Lipid Profile: No results for input(s): CHOL, HDL, LDLCALC, TRIG, CHOLHDL, LDLDIRECT in the last 72 hours. Thyroid Function Tests: No results for input(s): TSH, T4TOTAL, FREET4, T3FREE, THYROIDAB in the last 72 hours. Anemia Panel: No results for input(s): VITAMINB12, FOLATE, FERRITIN, TIBC, IRON, RETICCTPCT in the last 72 hours. Sepsis Labs: Recent Labs  Lab 04/17/20 1827 04/17/20 1931 04/18/20 0515 04/18/20 1951 04/19/20 0606  PROCALCITON 0.24  --  0.29  --   --   LATICACIDVEN  --  9.3* 5.0* 2.3*  1.4    Recent Results (from the past 240 hour(s))  SARS Coronavirus 2 by RT PCR (hospital order, performed in [facility] hospital lab) Nasopharyngeal Nasopharyngeal Swab     Status: None   Collection Time: 04/17/20  5:14 PM   Specimen: Nasopharyngeal Swab  Result Value Ref Range Status   SARS Coronavirus 2 NEGATIVE NEGATIVE Final    Comment: (NOTE) SARS-CoV-2 target nucleic acids are NOT DETECTED.  The SARS-CoV-2 RNA is generally detectable in upper and lower respiratory specimens during the acute phase of infection. The lowest concentration of SARS-CoV-2 viral copies this assay can detect is 250 copies / mL. A negative result does not preclude SARS-CoV-2 infection and should not be used as the sole basis for treatment or other patient management decisions.  A negative result may occur with improper specimen collection / handling, submission of specimen other than nasopharyngeal swab, presence of viral mutation(s) within the areas targeted by this assay, and inadequate number of viral copies (<250 copies / mL). A negative result must be combined with clinical observations, patient history, and epidemiological information.  Fact Sheet for Patients:   BoilerBrush.com.cy  Fact Sheet for Healthcare Providers: https://pope.com/  This test is not yet approved or  cleared by the [place] FDA and has been authorized for detection and/or diagnosis of SARS-CoV-2 by FDA under an Emergency Use Authorization (EUA).  This EUA will remain in effect (meaning this test can be used) for the duration of the COVID-19 declaration under Section 564(b)(1) of the Act, 21 U.S.C. section 360bbb-3(b)(1), unless the authorization is terminated or revoked sooner.  Performed at [facility], [address] [place], [place] [address]   Culture, blood (routine x 2)     Status: None   Collection Time: 04/17/20  6:27 PM   Specimen: BLOOD   Result Value Ref Range Status   Specimen Description BLOOD BLOOD LEFT FOREARM  Final   Special Requests   Final    BOTTLES DRAWN AEROBIC AND ANAEROBIC Blood Culture adequate volume   Culture   Final    NO GROWTH 5 DAYS Performed at [facility], [address] [place], [place] [address]    Report Status 04/22/2020 FINAL  Final  Culture, blood (routine x 2)     Status: None   Collection Time: 04/17/20  6:31 PM   Specimen: BLOOD  Result Value Ref Range Status   Specimen Description BLOOD BLOOD RIGHT WRIST  Final   Special Requests   Final    BOTTLES DRAWN AEROBIC AND ANAEROBIC Blood Culture results may not be optimal due to an inadequate volume of blood received in culture bottles   Culture   Final    NO GROWTH 5 DAYS Performed at [facility], [address] [place], [place] [address]    Report Status 04/22/2020 FINAL  Final  Aerobic/Anaerobic Culture (surgical/deep wound)     Status: None (Preliminary result)   Collection Time: 04/18/20  3:33 PM   Specimen: PATH Cytology Peritoneal fluid  Result Value Ref Range Status   Specimen Description   Final    PERITONEAL Performed at [facility], [address] [place], [place] [address]    Special Requests   Final    PERITONEAL Performed at [facility], [address] [place], [place] [address]    Gram Stain   Final    FEW WBC PRESENT, PREDOMINANTLY MONONUCLEAR NO ORGANISMS SEEN    Culture   Final    NO GROWTH 3 DAYS Performed at [facility], [address], [place], [place] [address]    Report Status PENDING  Incomplete         Radiology Studies: IR Angiogram Selective Each Additional Vessel  Result Date: 04/20/2020 CLINICAL DATA:  87 year old male with 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 He presents today for balloon  occluded transvenous obliteration of gastric varices. EXAM: 1. Ultrasound-guided access right internal jugular vein 2. Catheterization and venogram, left renal vein 3. Catheterization and venogram of gastro renal shunt 4. Catheterization and venogram of left inferior phrenic vein 5. Coil embolization of left inferior phrenic vein 6. Catheterization of venogram of accessory left inferior phrenic vein 7. Coil embolization of accessory left inferior phrenic vein 8. Embolization of gastric varices and gastro renal shunt MEDICATIONS: None. ANESTHESIA/SEDATION: 2 mg Versed and 100 mcg fentanyl were administered intravenously. Moderate Sedation Time:  64 minutes The patient was continuously monitored during the procedure by the interventional radiology nurse under my direct supervision. CONTRAST:  120 mL Omnipaque 350 FLUOROSCOPY TIME:  Fluoroscopy Time: 40 minutes 30 seconds (1472 mGy). COMPLICATIONS: None immediate. PROCEDURE: Informed written consent was obtained from the patient after a thorough discussion of the procedural risks, benefits and alternatives. All questions were addressed. Maximal Sterile Barrier Technique was utilized including caps, mask, sterile gowns, sterile gloves, sterile drape, hand hygiene and skin antiseptic. A timeout was performed prior to the initiation of the procedure. The right internal jugular vein was interrogated with ultrasound and found to be widely patent. An image was obtained and stored for the medical record. Local anesthesia was attained by infiltration with 1% lidocaine. A small dermatotomy was made. Under real-time sonographic guidance, the vessel was punctured with a 21 gauge micropuncture needle. Using standard technique, the initial micro needle was exchanged over a 0.018 micro wire for a transitional 4 [place] micro sheath. The micro sheath was then exchanged over a 0.035 wire for a 10 French fascial dilator and the soft tissue tract was dilated. Next, a 10 French tips  sheath was carefully advanced over the wire and into the inferior vena cava. An angled 5 French catheter was then advanced over the wire. The catheter was used to select the left renal vein. A left renal  venogram was performed. Standard venous anatomy. No evidence of left renal venous thrombus. A rose in wire was parked in an inferior branch of the left renal vein. The tips sheath was then advanced into the origin of the renal vein. Additional left renal venography was performed. The inflow of the gastro renal shunt was successfully identified along the superior margin of the left renal vein. The angled catheter was introduced through the tips sheath coaxially adjacent to the stabilizing Rosen wire. Utilizing a Glidewire, the catheter was successfully advanced into proximal left gastro renal shunt. Venography was again performed. The renal shunt is robust. The expected web near the origin was identified. Flow direction is toward the renal vein. The Glidewire and catheter were advanced deeper into the gastro renal shunt. The glidewire was exchanged for a superstiff Amplatz wire. The 5 French catheter was removed. Next, attempts were made to advance several occlusion balloons into the gastro renal shunt. Ultimately, a 15 French Merci balloon occlusion catheter was successfully advanced into the gastro renal shunt. The occlusion balloon was inflated and pulled snug against the web near the gastro renal shunt origin. Balloon occluded venography was then performed. The gastro renal shunt is quite massive. At least 2 large draining veins are identified escaping the gastro renal shunt and draining into the systemic system. These appear to represent the inferior phrenic vein and a second accessory inferior phrenic vein. Coil embolization of these non bleeding vessels will likely be required in order to obtain stasis within the recently hemorrhaged gastric varices. A penumbra lantern microcatheter was advanced coaxially  through the balloon occlusion catheter over a 0.016 fathom wire. The catheter was successfully used to select the inferior phrenic vein. The catheter was advanced into the inferior phrenic vein and venography was performed confirming catheter location. The inferior phrenic vein does drain into the systemic symptom and provide an outlet from the gastro renal shunt. Coil embolization was then performed using a series of 3 and 4 mm soft detachable Ruby coils. The microcatheter was brought back into the main body of the gastro renal shunt. The catheter was carefully advanced deeper into the gastro renal shunt and then used to successfully catheterize the next major escape vessel. The catheter was advanced into the vessel and venography performed. This appears to be an accessory inferior phrenic vein which drains into the azygos and hemi azygous system. This vessel must be sacrificed in order to proceed with obliteration of the target gastric varices. Therefore, coil embolization was performed utilizing a combination of 3, 4 and 5 mm Ruby detachable coils. The microcatheter was then brought back into the gastro renal shunt. Additional venography was performed, this time with carbon dioxide gas. The carbon dioxide gas successfully opacifies the entirety of the gastric variceal network. The afferent vein from the portal venous system appears to be the posterior gastric vein. Sclerosis and mixture was then prepared in the standard fashion (3: 2 : 1 mixture of air : 3% sodium tetradecyl Sulfate : Lipiodol). The sclerosis mixture was then instilled in aliquots through the lantern microcatheter filling the gastro renal shunt and ultimately the gastric varices. Injection was stopped when sclerosis and began to spill into the posterior gastric vein. Approximately 10 mL 3% sodium tetradecyl Sulfate and 4 mL Lipiodol were administered. The microcatheter was then brought back into the proximal gastro renal shunt just beyond the  occlusion balloon. Coil embolization of the proximal gastro renal shunt was then performed utilizing numerous 18, 16, 14 mm detachable Ruby  microcoils as well as multiple liquid metal packing coils. Once a dense metal plug was successfully created, the occlusion balloon was slowly deflated under real-time fluoroscopic imaging. The sclerosing mass and coil pack were stable. No evidence of mobilization or flow. The microcatheter was removed. The occlusion balloon was brought back into the left renal vein. Left renal venography was performed confirming persistent patency of the renal vein. No evidence of complication. The sheath and occlusion balloon were removed. Hemostasis was attained with the assistance of manual pressure. IMPRESSION: 1. Successful coil embolization of non bleeding accessory portosystemic collaterals (inferior phrenic an accessory inferior phrenic veins) which was required to successfully treated the bleeding gastric varices. 2. Successful coil assisted balloon occluded transvenous obliteration (CA-BRTO) of gastric varices using liquid sclerosant and detachable coils. PLAN: 1. Maintain bed rest overnight. Clear liquids are okay. Advanced diet as tolerated beginning tomorrow if patient remains stable. 2. BRTO protocol CT scan of the abdomen on Tuesday. 3. Patient will require outpatient follow-up EGD in approximately 6 weeks to assess for new or progressive esophageal varices and to assess the treated gastric varices. 4. Follow-up with Interventional Radiology in 3 months. Signed, Sterling Big, MD, RPVI [facility] Radiology Electronically Signed   By: Malachy Moan M.D.   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 He presents today for balloon occluded transvenous obliteration of gastric varices. EXAM: 1. Ultrasound-guided access right internal jugular vein 2. Catheterization and venogram, left renal vein 3. Catheterization and venogram of gastro renal shunt 4. Catheterization and venogram of left inferior phrenic vein 5. Coil embolization of left inferior phrenic vein 6. Catheterization of venogram of accessory left inferior phrenic vein 7. Coil embolization of accessory left inferior phrenic vein 8. Embolization of gastric varices and gastro renal shunt MEDICATIONS: None. ANESTHESIA/SEDATION: 2 mg Versed and 100 mcg fentanyl were administered intravenously. Moderate Sedation Time:  64 minutes The patient was continuously monitored during the procedure by the interventional radiology nurse under my direct supervision. CONTRAST:  120 mL Omnipaque 350 FLUOROSCOPY TIME:  Fluoroscopy Time: 40 minutes 30 seconds (1472 mGy). COMPLICATIONS: None immediate. PROCEDURE: Informed written consent was obtained from the patient after a thorough discussion of the procedural risks, benefits and alternatives. All questions were addressed. Maximal Sterile Barrier Technique was utilized including caps, mask, sterile gowns, sterile gloves, sterile drape, hand hygiene and skin antiseptic. A timeout was performed prior to the initiation of the procedure. The right internal jugular vein was interrogated with ultrasound and found to be widely patent. An image was obtained and stored for the medical record. Local anesthesia was attained by infiltration with 1% lidocaine. A small dermatotomy was made. Under real-time sonographic guidance, the vessel was punctured with a 21 gauge micropuncture needle. Using standard technique, the initial micro needle was exchanged over a 0.018 micro wire for a transitional 4 [place] micro sheath. The micro sheath was then exchanged over a 0.035 wire for a 10 French  fascial dilator and the soft tissue tract was dilated. Next, a 10 French tips sheath was carefully advanced over the wire and into the inferior vena cava. An angled 5 French catheter was then advanced over the wire. The catheter was used to select the left renal vein. A left renal  venogram was performed. Standard venous anatomy. No evidence of left renal venous thrombus. A rose in wire was parked in an inferior branch of the left renal vein. The tips sheath was then advanced into the origin of the renal vein. Additional left renal venography was performed. The inflow of the gastro renal shunt was successfully identified along the superior margin of the left renal vein. The angled catheter was introduced through the tips sheath coaxially adjacent to the stabilizing Rosen wire. Utilizing a Glidewire, the catheter was successfully advanced into proximal left gastro renal shunt. Venography was again performed. The renal shunt is robust. The expected web near the origin was identified. Flow direction is toward the renal vein. The Glidewire and catheter were advanced deeper into the gastro renal shunt. The glidewire was exchanged for a superstiff Amplatz wire. The 5 French catheter was removed. Next, attempts were made to advance several occlusion balloons into the gastro renal shunt. Ultimately, a 56 French Merci balloon occlusion catheter was successfully advanced into the gastro renal shunt. The occlusion balloon was inflated and pulled snug against the web near the gastro renal shunt origin. Balloon occluded venography was then performed. The gastro renal shunt is quite massive. At least 2 large draining veins are identified escaping the gastro renal shunt and draining into the systemic system. These appear to represent the inferior phrenic vein and a second accessory inferior phrenic vein. Coil embolization of these non bleeding vessels will likely be required in order to obtain stasis within the recently hemorrhaged  gastric varices. A penumbra lantern microcatheter was advanced coaxially through the balloon occlusion catheter over a 0.016 fathom wire. The catheter was successfully used to select the inferior phrenic vein. The catheter was advanced into the inferior phrenic vein and venography was performed confirming catheter location. The inferior phrenic vein does drain into the systemic symptom and provide an outlet from the gastro renal shunt. Coil embolization was then performed using a series of 3 and 4 mm soft detachable Ruby coils. The microcatheter was brought back into the main body of the gastro renal shunt. The catheter was carefully advanced deeper into the gastro renal shunt and then used to successfully catheterize the next major escape vessel. The catheter was advanced into the vessel and venography performed. This appears to be an accessory inferior phrenic vein which drains into the azygos and hemi azygous system. This vessel must be sacrificed in order to proceed with obliteration of the target gastric varices. Therefore, coil embolization was performed utilizing a combination of 3, 4 and 5 mm Ruby detachable coils. The microcatheter was then brought back into the gastro renal shunt. Additional venography was performed, this time with carbon dioxide gas. The carbon dioxide gas successfully opacifies the entirety of the gastric variceal network. The afferent vein from the portal venous system appears to be the posterior gastric vein. Sclerosis and mixture was then prepared in the standard fashion (3: 2 : 1 mixture of air : 3% sodium tetradecyl Sulfate : Lipiodol). The sclerosis mixture was then instilled in aliquots through the lantern microcatheter filling the gastro renal shunt and ultimately the gastric varices. Injection was stopped when sclerosis and began to spill into the posterior gastric vein. Approximately 10 mL 3% sodium tetradecyl Sulfate and 4 mL Lipiodol were administered. The microcatheter was  then brought back into the proximal gastro renal shunt just beyond the occlusion balloon. Coil embolization of the proximal gastro renal shunt was then performed utilizing numerous 18, 16, 14 mm detachable Ruby  microcoils as well as multiple liquid metal packing coils. Once a dense metal plug was successfully created, the occlusion balloon was slowly deflated under real-time fluoroscopic imaging. The sclerosing mass and coil pack were stable. No evidence of mobilization or flow. The microcatheter was removed. The occlusion balloon was brought back into the left renal vein. Left renal venography was performed confirming persistent patency of the renal vein. No evidence of complication. The sheath and occlusion balloon were removed. Hemostasis was attained with the assistance of manual pressure. IMPRESSION: 1. Successful coil embolization of non bleeding accessory portosystemic collaterals (inferior phrenic an accessory inferior phrenic veins) which was required to successfully treated the bleeding gastric varices. 2. Successful coil assisted balloon occluded transvenous obliteration (CA-BRTO) of gastric varices using liquid sclerosant and detachable coils. PLAN: 1. Maintain bed rest overnight. Clear liquids are okay. Advanced diet as tolerated beginning tomorrow if patient remains stable. 2. BRTO protocol CT scan of the abdomen on Tuesday. 3. Patient will require outpatient follow-up EGD in approximately 6 weeks to assess for new or progressive esophageal varices and to assess the treated gastric varices. 4. Follow-up with Interventional Radiology in 3 months. Signed, Sterling Big, MD, RPVI [facility] Electronically Signed   By: Malachy Moan M.D.   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 He presents today for balloon occluded transvenous obliteration of gastric varices. EXAM: 1. Ultrasound-guided access right internal jugular vein 2. Catheterization and venogram, left renal vein 3. Catheterization and venogram of gastro renal shunt 4. Catheterization and venogram of left inferior phrenic vein 5. Coil embolization of left inferior phrenic vein 6. Catheterization of venogram of accessory left inferior phrenic vein 7. Coil embolization of accessory left inferior phrenic vein 8. Embolization of gastric varices and gastro renal shunt MEDICATIONS: None. ANESTHESIA/SEDATION: 2 mg Versed and 100 mcg fentanyl were administered intravenously. Moderate Sedation Time:  64 minutes The patient was continuously monitored during the procedure by the interventional radiology nurse under my direct supervision. CONTRAST:  120 mL Omnipaque 350 FLUOROSCOPY TIME:  Fluoroscopy Time: 40 minutes 30 seconds (1472 mGy). COMPLICATIONS: None immediate. PROCEDURE: Informed written consent was obtained from the patient after a thorough discussion of the procedural risks, benefits and alternatives. All questions were addressed. Maximal Sterile Barrier Technique was utilized including caps, mask, sterile gowns, sterile gloves, sterile drape, hand hygiene and skin antiseptic. A timeout was performed prior to the initiation of the procedure. The right internal jugular vein was interrogated with ultrasound and found to be widely patent. An image was obtained and stored for the medical record. Local anesthesia was attained by infiltration with 1% lidocaine. A small dermatotomy was made. Under real-time sonographic guidance, the vessel was punctured with a 21 gauge micropuncture needle. Using standard technique, the initial micro needle was exchanged over a 0.018 micro wire for a transitional 4 [place] micro sheath.  The micro sheath was then exchanged over a 0.035 wire for a 10 French fascial dilator and the soft tissue tract was dilated. Next, a 10 French tips sheath was carefully advanced over the wire and into the inferior vena cava. An angled 5 French catheter was then advanced over the wire. The catheter was used to select the left renal vein. A left renal  venogram was performed. Standard venous anatomy. No evidence of left renal venous thrombus. A rose in wire was parked in an inferior branch of the left renal vein. The tips sheath was then advanced into the origin of the renal vein. Additional left renal venography was performed. The inflow of the gastro renal shunt was successfully identified along the superior margin of the left renal vein. The angled catheter was introduced through the tips sheath coaxially adjacent to the stabilizing Rosen wire. Utilizing a Glidewire, the catheter was successfully advanced into proximal left gastro renal shunt. Venography was again performed. The renal shunt is robust. The expected web near the origin was identified. Flow direction is toward the renal vein. The Glidewire and catheter were advanced deeper into the gastro renal shunt. The glidewire was exchanged for a superstiff Amplatz wire. The 5 French catheter was removed. Next, attempts were made to advance several occlusion balloons into the gastro renal shunt. Ultimately, a 62 French Merci balloon occlusion catheter was successfully advanced into the gastro renal shunt. The occlusion balloon was inflated and pulled snug against the web near the gastro renal shunt origin. Balloon occluded venography was then performed. The gastro renal shunt is quite massive. At least 2 large draining veins are identified escaping the gastro renal shunt and draining into the systemic system. These appear to represent the inferior phrenic vein and a second accessory inferior phrenic vein. Coil embolization of these non bleeding vessels will likely  be required in order to obtain stasis within the recently hemorrhaged gastric varices. A penumbra lantern microcatheter was advanced coaxially through the balloon occlusion catheter over a 0.016 fathom wire. The catheter was successfully used to select the inferior phrenic vein. The catheter was advanced into the inferior phrenic vein and venography was performed confirming catheter location. The inferior phrenic vein does drain into the systemic symptom and provide an outlet from the gastro renal shunt. Coil embolization was then performed using a series of 3 and 4 mm soft detachable Ruby coils. The microcatheter was brought back into the main body of the gastro renal shunt. The catheter was carefully advanced deeper into the gastro renal shunt and then used to successfully catheterize the next major escape vessel. The catheter was advanced into the vessel and venography performed. This appears to be an accessory inferior phrenic vein which drains into the azygos and hemi azygous system. This vessel must be sacrificed in order to proceed with obliteration of the target gastric varices. Therefore, coil embolization was performed utilizing a combination of 3, 4 and 5 mm Ruby detachable coils. The microcatheter was then brought back into the gastro renal shunt. Additional venography was performed, this time with carbon dioxide gas. The carbon dioxide gas successfully opacifies the entirety of the gastric variceal network. The afferent vein from the portal venous system appears to be the posterior gastric vein. Sclerosis and mixture was then prepared in the standard fashion (3: 2 : 1 mixture of air : 3% sodium tetradecyl Sulfate : Lipiodol). The sclerosis mixture was then instilled in aliquots through the lantern microcatheter filling the gastro renal shunt and ultimately the gastric varices. Injection was stopped when sclerosis and began to spill into the posterior gastric vein. Approximately 10 mL 3% sodium  tetradecyl Sulfate and 4 mL Lipiodol were administered. The microcatheter was then brought back into the proximal gastro renal shunt just beyond the occlusion balloon. Coil embolization of the proximal gastro renal shunt was then performed utilizing numerous 18, 16, 14 mm detachable Ruby  microcoils as well as multiple liquid metal packing coils. Once a dense metal plug was successfully created, the occlusion balloon was slowly deflated under real-time fluoroscopic imaging. The sclerosing mass and coil pack were stable. No evidence of mobilization or flow. The microcatheter was removed. The occlusion balloon was brought back into the left renal vein. Left renal venography was performed confirming persistent patency of the renal vein. No evidence of complication. The sheath and occlusion balloon were removed. Hemostasis was attained with the assistance of manual pressure. IMPRESSION: 1. Successful coil embolization of non bleeding accessory portosystemic collaterals (inferior phrenic an accessory inferior phrenic veins) which was required to successfully treated the bleeding gastric varices. 2. Successful coil assisted balloon occluded transvenous obliteration (CA-BRTO) of gastric varices using liquid sclerosant and detachable coils. PLAN: 1. Maintain bed rest overnight. Clear liquids are okay. Advanced diet as tolerated beginning tomorrow if patient remains stable. 2. BRTO protocol CT scan of the abdomen on Tuesday. 3. Patient will require outpatient follow-up EGD in approximately 6 weeks to assess for new or progressive esophageal varices and to assess the treated gastric varices. 4. Follow-up with Interventional Radiology in 3 months. Signed, Sterling Big, MD, RPVI [facility] Radiology Electronically Signed   By: Malachy Moan M.D.   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 He presents today for balloon occluded transvenous obliteration of gastric varices. EXAM: 1. Ultrasound-guided access right internal jugular vein 2. Catheterization and venogram, left renal vein 3. Catheterization and venogram of gastro renal shunt 4. Catheterization and venogram of left inferior phrenic vein 5. Coil embolization of left inferior phrenic vein 6. Catheterization of venogram of accessory left inferior phrenic vein 7. Coil embolization of accessory left inferior phrenic vein 8. Embolization of gastric varices and gastro renal shunt MEDICATIONS: None. ANESTHESIA/SEDATION: 2 mg Versed and 100 mcg fentanyl were administered intravenously. Moderate Sedation Time:  64 minutes The patient was continuously monitored during the procedure by the interventional radiology nurse under my direct supervision. CONTRAST:  120 mL Omnipaque 350 FLUOROSCOPY TIME:  Fluoroscopy Time: 40 minutes 30 seconds (1472 mGy). COMPLICATIONS: None immediate. PROCEDURE: Informed written consent was obtained from the patient after a thorough discussion of the procedural risks, benefits and alternatives. All questions were addressed. Maximal Sterile Barrier Technique was utilized including caps, mask, sterile gowns, sterile gloves, sterile drape, hand hygiene and skin antiseptic. A timeout was performed prior to the initiation of the procedure. The right internal jugular vein was interrogated with ultrasound and found to be widely patent. An image was obtained and stored for the medical record. Local anesthesia was attained by infiltration with 1% lidocaine. A small dermatotomy was made. Under real-time sonographic guidance, the vessel was punctured with a 21 gauge micropuncture needle. Using standard technique, the initial micro needle was exchanged over  a 0.018 micro wire for a transitional 4 [place] micro sheath. The micro sheath was then exchanged over a 0.035 wire for a 10 French fascial dilator and the soft tissue tract was dilated. Next, a 10 French tips sheath was carefully advanced over the wire and into the inferior vena cava. An angled 5 French catheter was then advanced over the wire. The catheter was used to select the left renal vein. A left renal venogram  was performed. Standard venous anatomy. No evidence of left renal venous thrombus. A rose in wire was parked in an inferior branch of the left renal vein. The tips sheath was then advanced into the origin of the renal vein. Additional left renal venography was performed. The inflow of the gastro renal shunt was successfully identified along the superior margin of the left renal vein. The angled catheter was introduced through the tips sheath coaxially adjacent to the stabilizing Rosen wire. Utilizing a Glidewire, the catheter was successfully advanced into proximal left gastro renal shunt. Venography was again performed. The renal shunt is robust. The expected web near the origin was identified. Flow direction is toward the renal vein. The Glidewire and catheter were advanced deeper into the gastro renal shunt. The glidewire was exchanged for a superstiff Amplatz wire. The 5 French catheter was removed. Next, attempts were made to advance several occlusion balloons into the gastro renal shunt. Ultimately, a 61 French Merci balloon occlusion catheter was successfully advanced into the gastro renal shunt. The occlusion balloon was inflated and pulled snug against the web near the gastro renal shunt origin. Balloon occluded venography was then performed. The gastro renal shunt is quite massive. At least 2 large draining veins are identified escaping the gastro renal shunt and draining into the systemic system. These appear to represent the inferior phrenic vein and a second accessory inferior phrenic vein.  Coil embolization of these non bleeding vessels will likely be required in order to obtain stasis within the recently hemorrhaged gastric varices. A penumbra lantern microcatheter was advanced coaxially through the balloon occlusion catheter over a 0.016 fathom wire. The catheter was successfully used to select the inferior phrenic vein. The catheter was advanced into the inferior phrenic vein and venography was performed confirming catheter location. The inferior phrenic vein does drain into the systemic symptom and provide an outlet from the gastro renal shunt. Coil embolization was then performed using a series of 3 and 4 mm soft detachable Ruby coils. The microcatheter was brought back into the main body of the gastro renal shunt. The catheter was carefully advanced deeper into the gastro renal shunt and then used to successfully catheterize the next major escape vessel. The catheter was advanced into the vessel and venography performed. This appears to be an accessory inferior phrenic vein which drains into the azygos and hemi azygous system. This vessel must be sacrificed in order to proceed with obliteration of the target gastric varices. Therefore, coil embolization was performed utilizing a combination of 3, 4 and 5 mm Ruby detachable coils. The microcatheter was then brought back into the gastro renal shunt. Additional venography was performed, this time with carbon dioxide gas. The carbon dioxide gas successfully opacifies the entirety of the gastric variceal network. The afferent vein from the portal venous system appears to be the posterior gastric vein. Sclerosis and mixture was then prepared in the standard fashion (3: 2 : 1 mixture of air : 3% sodium tetradecyl Sulfate : Lipiodol). The sclerosis mixture was then instilled in aliquots through the lantern microcatheter filling the gastro renal shunt and ultimately the gastric varices. Injection was stopped when sclerosis and began to spill into the  posterior gastric vein. Approximately 10 mL 3% sodium tetradecyl Sulfate and 4 mL Lipiodol were administered. The microcatheter was then brought back into the proximal gastro renal shunt just beyond the occlusion balloon. Coil embolization of the proximal gastro renal shunt was then performed utilizing numerous 18, 16, 14 mm detachable Ruby microcoils  as well as multiple liquid metal packing coils. Once a dense metal plug was successfully created, the occlusion balloon was slowly deflated under real-time fluoroscopic imaging. The sclerosing mass and coil pack were stable. No evidence of mobilization or flow. The microcatheter was removed. The occlusion balloon was brought back into the left renal vein. Left renal venography was performed confirming persistent patency of the renal vein. No evidence of complication. The sheath and occlusion balloon were removed. Hemostasis was attained with the assistance of manual pressure. IMPRESSION: 1. Successful coil embolization of non bleeding accessory portosystemic collaterals (inferior phrenic an accessory inferior phrenic veins) which was required to successfully treated the bleeding gastric varices. 2. Successful coil assisted balloon occluded transvenous obliteration (CA-BRTO) of gastric varices using liquid sclerosant and detachable coils. PLAN: 1. Maintain bed rest overnight. Clear liquids are okay. Advanced diet as tolerated beginning tomorrow if patient remains stable. 2. BRTO protocol CT scan of the abdomen on Tuesday. 3. Patient will require outpatient follow-up EGD in approximately 6 weeks to assess for new or progressive esophageal varices and to assess the treated gastric varices. 4. Follow-up with Interventional Radiology in 3 months. Signed, Sterling BigHeath K. McCullough, MD, RPVI [facility] Electronically Signed   By: Malachy MoanHeath  McCullough M.D.   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 He presents today for balloon occluded transvenous obliteration of gastric varices. EXAM: 1. Ultrasound-guided access right internal jugular vein 2. Catheterization and venogram, left renal vein 3. Catheterization and venogram of gastro renal shunt 4. Catheterization and venogram of left inferior phrenic vein 5. Coil embolization of left inferior phrenic vein 6. Catheterization of venogram of accessory left inferior phrenic vein 7. Coil embolization of accessory left inferior phrenic vein 8. Embolization of gastric varices and gastro renal shunt MEDICATIONS: None. ANESTHESIA/SEDATION: 2 mg Versed and 100 mcg fentanyl were administered intravenously. Moderate Sedation Time:  64 minutes The patient was continuously monitored during the procedure by the interventional radiology nurse under my direct supervision. CONTRAST:  120 mL Omnipaque 350 FLUOROSCOPY TIME:  Fluoroscopy Time: 40 minutes 30 seconds (1472 mGy). COMPLICATIONS: None immediate. PROCEDURE: Informed written consent was obtained from the patient after a thorough discussion of the procedural risks, benefits and alternatives. All questions were addressed. Maximal Sterile Barrier Technique was utilized including caps, mask, sterile gowns, sterile gloves, sterile drape, hand hygiene and skin antiseptic. A timeout was performed prior to the initiation of the procedure. The right internal jugular vein was interrogated with ultrasound and found to be widely patent. An image was obtained and stored for the medical record. Local anesthesia was attained by infiltration with 1% lidocaine. A small dermatotomy was made. Under real-time sonographic guidance, the vessel was punctured with a 21 gauge micropuncture needle. Using standard  technique, the initial micro needle was exchanged over a 0.018 micro wire for a transitional 4 [place] micro sheath. The micro sheath was then exchanged over a 0.035 wire for a 10 French fascial dilator and the soft tissue tract was dilated. Next, a 10 French tips sheath was carefully advanced over the wire and into the inferior vena cava. An angled 5 French catheter was then advanced over the wire. The catheter was used to select the left renal vein. A left renal venogram  was performed. Standard venous anatomy. No evidence of left renal venous thrombus. A rose in wire was parked in an inferior branch of the left renal vein. The tips sheath was then advanced into the origin of the renal vein. Additional left renal venography was performed. The inflow of the gastro renal shunt was successfully identified along the superior margin of the left renal vein. The angled catheter was introduced through the tips sheath coaxially adjacent to the stabilizing Rosen wire. Utilizing a Glidewire, the catheter was successfully advanced into proximal left gastro renal shunt. Venography was again performed. The renal shunt is robust. The expected web near the origin was identified. Flow direction is toward the renal vein. The Glidewire and catheter were advanced deeper into the gastro renal shunt. The glidewire was exchanged for a superstiff Amplatz wire. The 5 French catheter was removed. Next, attempts were made to advance several occlusion balloons into the gastro renal shunt. Ultimately, a 51 French Merci balloon occlusion catheter was successfully advanced into the gastro renal shunt. The occlusion balloon was inflated and pulled snug against the web near the gastro renal shunt origin. Balloon occluded venography was then performed. The gastro renal shunt is quite massive. At least 2 large draining veins are identified escaping the gastro renal shunt and draining into the systemic system. These appear to represent the inferior  phrenic vein and a second accessory inferior phrenic vein. Coil embolization of these non bleeding vessels will likely be required in order to obtain stasis within the recently hemorrhaged gastric varices. A penumbra lantern microcatheter was advanced coaxially through the balloon occlusion catheter over a 0.016 fathom wire. The catheter was successfully used to select the inferior phrenic vein. The catheter was advanced into the inferior phrenic vein and venography was performed confirming catheter location. The inferior phrenic vein does drain into the systemic symptom and provide an outlet from the gastro renal shunt. Coil embolization was then performed using a series of 3 and 4 mm soft detachable Ruby coils. The microcatheter was brought back into the main body of the gastro renal shunt. The catheter was carefully advanced deeper into the gastro renal shunt and then used to successfully catheterize the next major escape vessel. The catheter was advanced into the vessel and venography performed. This appears to be an accessory inferior phrenic vein which drains into the azygos and hemi azygous system. This vessel must be sacrificed in order to proceed with obliteration of the target gastric varices. Therefore, coil embolization was performed utilizing a combination of 3, 4 and 5 mm Ruby detachable coils. The microcatheter was then brought back into the gastro renal shunt. Additional venography was performed, this time with carbon dioxide gas. The carbon dioxide gas successfully opacifies the entirety of the gastric variceal network. The afferent vein from the portal venous system appears to be the posterior gastric vein. Sclerosis and mixture was then prepared in the standard fashion (3: 2 : 1 mixture of air : 3% sodium tetradecyl Sulfate : Lipiodol). The sclerosis mixture was then instilled in aliquots through the lantern microcatheter filling the gastro renal shunt and ultimately the gastric varices. Injection  was stopped when sclerosis and began to spill into the posterior gastric vein. Approximately 10 mL 3% sodium tetradecyl Sulfate and 4 mL Lipiodol were administered. The microcatheter was then brought back into the proximal gastro renal shunt just beyond the occlusion balloon. Coil embolization of the proximal gastro renal shunt was then performed utilizing numerous 18, 16, 14 mm detachable Ruby microcoils  as well as multiple liquid metal packing coils. Once a dense metal plug was successfully created, the occlusion balloon was slowly deflated under real-time fluoroscopic imaging. The sclerosing mass and coil pack were stable. No evidence of mobilization or flow. The microcatheter was removed. The occlusion balloon was brought back into the left renal vein. Left renal venography was performed confirming persistent patency of the renal vein. No evidence of complication. The sheath and occlusion balloon were removed. Hemostasis was attained with the assistance of manual pressure. IMPRESSION: 1. Successful coil embolization of non bleeding accessory portosystemic collaterals (inferior phrenic an accessory inferior phrenic veins) which was required to successfully treated the bleeding gastric varices. 2. Successful coil assisted balloon occluded transvenous obliteration (CA-BRTO) of gastric varices using liquid sclerosant and detachable coils. PLAN: 1. Maintain bed rest overnight. Clear liquids are okay. Advanced diet as tolerated beginning tomorrow if patient remains stable. 2. BRTO protocol CT scan of the abdomen on Tuesday. 3. Patient will require outpatient follow-up EGD in approximately 6 weeks to assess for new or progressive esophageal varices and to assess the treated gastric varices. 4. Follow-up with Interventional Radiology in 3 months. Signed, Sterling Big, MD, RPVI [facility] Radiology Electronically Signed   By: Malachy Moan M.D.   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 He presents today for balloon occluded transvenous obliteration of gastric varices. EXAM: 1. Ultrasound-guided access right internal jugular vein 2. Catheterization and venogram, left renal vein 3. Catheterization and venogram of gastro renal shunt 4. Catheterization and venogram of left inferior phrenic vein 5. Coil embolization of left inferior phrenic vein 6. Catheterization of venogram of accessory left inferior phrenic vein 7. Coil embolization of accessory left inferior phrenic vein 8. Embolization of gastric varices and gastro renal shunt MEDICATIONS: None. ANESTHESIA/SEDATION: 2 mg Versed and 100 mcg fentanyl were administered intravenously. Moderate Sedation Time:  64 minutes The patient was continuously monitored during the procedure by the interventional radiology nurse under my direct supervision. CONTRAST:  120 mL Omnipaque 350 FLUOROSCOPY TIME:  Fluoroscopy Time: 40 minutes 30 seconds (1472 mGy). COMPLICATIONS: None immediate. PROCEDURE: Informed written consent was obtained from the patient after a thorough discussion of the procedural risks, benefits and alternatives. All questions were addressed. Maximal Sterile Barrier Technique was utilized including caps, mask, sterile gowns, sterile gloves, sterile drape, hand hygiene and skin antiseptic. A timeout was performed prior to the initiation of the procedure. The right internal jugular vein was interrogated with ultrasound and found to be widely patent. An image was obtained and stored for the medical record. Local anesthesia was attained by infiltration with 1% lidocaine. A small dermatotomy was made. Under real-time sonographic guidance,  the vessel was punctured with a 21 gauge micropuncture needle. Using standard technique, the initial micro needle was exchanged over a 0.018 micro wire for a transitional 4 [place] micro sheath. The micro sheath was then exchanged over a 0.035 wire for a 10 French fascial dilator and the soft tissue tract was dilated. Next, a 10 French tips sheath was carefully advanced over the wire and into the inferior vena cava. An angled 5 French catheter was then advanced over the wire. The catheter was used to select the left renal vein.  A left renal venogram was performed. Standard venous anatomy. No evidence of left renal venous thrombus. A rose in wire was parked in an inferior branch of the left renal vein. The tips sheath was then advanced into the origin of the renal vein. Additional left renal venography was performed. The inflow of the gastro renal shunt was successfully identified along the superior margin of the left renal vein. The angled catheter was introduced through the tips sheath coaxially adjacent to the stabilizing Rosen wire. Utilizing a Glidewire, the catheter was successfully advanced into proximal left gastro renal shunt. Venography was again performed. The renal shunt is robust. The expected web near the origin was identified. Flow direction is toward the renal vein. The Glidewire and catheter were advanced deeper into the gastro renal shunt. The glidewire was exchanged for a superstiff Amplatz wire. The 5 French catheter was removed. Next, attempts were made to advance several occlusion balloons into the gastro renal shunt. Ultimately, a 70 French Merci balloon occlusion catheter was successfully advanced into the gastro renal shunt. The occlusion balloon was inflated and pulled snug against the web near the gastro renal shunt origin. Balloon occluded venography was then performed. The gastro renal shunt is quite massive. At least 2 large draining veins are identified escaping the gastro renal shunt and  draining into the systemic system. These appear to represent the inferior phrenic vein and a second accessory inferior phrenic vein. Coil embolization of these non bleeding vessels will likely be required in order to obtain stasis within the recently hemorrhaged gastric varices. A penumbra lantern microcatheter was advanced coaxially through the balloon occlusion catheter over a 0.016 fathom wire. The catheter was successfully used to select the inferior phrenic vein. The catheter was advanced into the inferior phrenic vein and venography was performed confirming catheter location. The inferior phrenic vein does drain into the systemic symptom and provide an outlet from the gastro renal shunt. Coil embolization was then performed using a series of 3 and 4 mm soft detachable Ruby coils. The microcatheter was brought back into the main body of the gastro renal shunt. The catheter was carefully advanced deeper into the gastro renal shunt and then used to successfully catheterize the next major escape vessel. The catheter was advanced into the vessel and venography performed. This appears to be an accessory inferior phrenic vein which drains into the azygos and hemi azygous system. This vessel must be sacrificed in order to proceed with obliteration of the target gastric varices. Therefore, coil embolization was performed utilizing a combination of 3, 4 and 5 mm Ruby detachable coils. The microcatheter was then brought back into the gastro renal shunt. Additional venography was performed, this time with carbon dioxide gas. The carbon dioxide gas successfully opacifies the entirety of the gastric variceal network. The afferent vein from the portal venous system appears to be the posterior gastric vein. Sclerosis and mixture was then prepared in the standard fashion (3: 2 : 1 mixture of air : 3% sodium tetradecyl Sulfate : Lipiodol). The sclerosis mixture was then instilled in aliquots through the lantern microcatheter  filling the gastro renal shunt and ultimately the gastric varices. Injection was stopped when sclerosis and began to spill into the posterior gastric vein. Approximately 10 mL 3% sodium tetradecyl Sulfate and 4 mL Lipiodol were administered. The microcatheter was then brought back into the proximal gastro renal shunt just beyond the occlusion balloon. Coil embolization of the proximal gastro renal shunt was then performed utilizing numerous 18, 16, 14  mm detachable Ruby microcoils as well as multiple liquid metal packing coils. Once a dense metal plug was successfully created, the occlusion balloon was slowly deflated under real-time fluoroscopic imaging. The sclerosing mass and coil pack were stable. No evidence of mobilization or flow. The microcatheter was removed. The occlusion balloon was brought back into the left renal vein. Left renal venography was performed confirming persistent patency of the renal vein. No evidence of complication. The sheath and occlusion balloon were removed. Hemostasis was attained with the assistance of manual pressure. IMPRESSION: 1. Successful coil embolization of non bleeding accessory portosystemic collaterals (inferior phrenic an accessory inferior phrenic veins) which was required to successfully treated the bleeding gastric varices. 2. Successful coil assisted balloon occluded transvenous obliteration (CA-BRTO) of gastric varices using liquid sclerosant and detachable coils. PLAN: 1. Maintain bed rest overnight. Clear liquids are okay. Advanced diet as tolerated beginning tomorrow if patient remains stable. 2. BRTO protocol CT scan of the abdomen on Tuesday. 3. Patient will require outpatient follow-up EGD in approximately 6 weeks to assess for new or progressive esophageal varices and to assess the treated gastric varices. 4. Follow-up with Interventional Radiology in 3 months. Signed, Sterling Big, MD, RPVI [facility]  Radiology Electronically Signed   By: Malachy Moan M.D.   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 He presents today for balloon occluded transvenous obliteration of gastric varices. EXAM: 1. Ultrasound-guided access right internal jugular vein 2. Catheterization and venogram, left renal vein 3. Catheterization and venogram of gastro renal shunt 4. Catheterization and venogram of left inferior phrenic vein 5. Coil embolization of left inferior phrenic vein 6. Catheterization of venogram of accessory left inferior phrenic vein 7. Coil embolization of accessory left inferior phrenic vein 8. Embolization of gastric varices and gastro renal shunt MEDICATIONS: None. ANESTHESIA/SEDATION: 2 mg Versed and 100 mcg fentanyl were administered intravenously. Moderate Sedation Time:  64 minutes The patient was continuously monitored during the procedure by the interventional radiology nurse under my direct supervision. CONTRAST:  120 mL Omnipaque 350 FLUOROSCOPY TIME:  Fluoroscopy Time: 40 minutes 30 seconds (1472 mGy). COMPLICATIONS: None immediate. PROCEDURE: Informed written consent was obtained from the patient after a thorough discussion of the procedural risks, benefits and alternatives. All questions were addressed. Maximal Sterile Barrier Technique was utilized including caps, mask, sterile gowns, sterile gloves, sterile drape, hand hygiene and skin antiseptic. A timeout was performed prior to the initiation of the procedure. The right internal jugular vein was interrogated with ultrasound and found to be widely patent. An image was obtained and stored for the medical record. Local anesthesia was attained by infiltration with  1% lidocaine. A small dermatotomy was made. Under real-time sonographic guidance, the vessel was punctured with a 21 gauge micropuncture needle. Using standard technique, the initial micro needle was exchanged over a 0.018 micro wire for a transitional 4 [place] micro sheath. The micro sheath was then exchanged over a 0.035 wire for a 10 French fascial dilator and the soft tissue tract was dilated. Next, a 10 French tips sheath was carefully advanced over the wire and into the inferior vena cava. An angled 5 French catheter was then advanced over the wire. The catheter was used  to select the left renal vein. A left renal venogram was performed. Standard venous anatomy. No evidence of left renal venous thrombus. A rose in wire was parked in an inferior branch of the left renal vein. The tips sheath was then advanced into the origin of the renal vein. Additional left renal venography was performed. The inflow of the gastro renal shunt was successfully identified along the superior margin of the left renal vein. The angled catheter was introduced through the tips sheath coaxially adjacent to the stabilizing Rosen wire. Utilizing a Glidewire, the catheter was successfully advanced into proximal left gastro renal shunt. Venography was again performed. The renal shunt is robust. The expected web near the origin was identified. Flow direction is toward the renal vein. The Glidewire and catheter were advanced deeper into the gastro renal shunt. The glidewire was exchanged for a superstiff Amplatz wire. The 5 French catheter was removed. Next, attempts were made to advance several occlusion balloons into the gastro renal shunt. Ultimately, a 74 French Merci balloon occlusion catheter was successfully advanced into the gastro renal shunt. The occlusion balloon was inflated and pulled snug against the web near the gastro renal shunt origin. Balloon occluded venography was then performed. The gastro renal shunt is quite massive.  At least 2 large draining veins are identified escaping the gastro renal shunt and draining into the systemic system. These appear to represent the inferior phrenic vein and a second accessory inferior phrenic vein. Coil embolization of these non bleeding vessels will likely be required in order to obtain stasis within the recently hemorrhaged gastric varices. A penumbra lantern microcatheter was advanced coaxially through the balloon occlusion catheter over a 0.016 fathom wire. The catheter was successfully used to select the inferior phrenic vein. The catheter was advanced into the inferior phrenic vein and venography was performed confirming catheter location. The inferior phrenic vein does drain into the systemic symptom and provide an outlet from the gastro renal shunt. Coil embolization was then performed using a series of 3 and 4 mm soft detachable Ruby coils. The microcatheter was brought back into the main body of the gastro renal shunt. The catheter was carefully advanced deeper into the gastro renal shunt and then used to successfully catheterize the next major escape vessel. The catheter was advanced into the vessel and venography performed. This appears to be an accessory inferior phrenic vein which drains into the azygos and hemi azygous system. This vessel must be sacrificed in order to proceed with obliteration of the target gastric varices. Therefore, coil embolization was performed utilizing a combination of 3, 4 and 5 mm Ruby detachable coils. The microcatheter was then brought back into the gastro renal shunt. Additional venography was performed, this time with carbon dioxide gas. The carbon dioxide gas successfully opacifies the entirety of the gastric variceal network. The afferent vein from the portal venous system appears to be the posterior gastric vein. Sclerosis and mixture was then prepared in the standard fashion (3: 2 : 1 mixture of air : 3% sodium tetradecyl Sulfate : Lipiodol). The  sclerosis mixture was then instilled in aliquots through the lantern microcatheter filling the gastro renal shunt and ultimately the gastric varices. Injection was stopped when sclerosis and began to spill into the posterior gastric vein. Approximately 10 mL 3% sodium tetradecyl Sulfate and 4 mL Lipiodol were administered. The microcatheter was then brought back into the proximal gastro renal shunt just beyond the occlusion balloon. Coil embolization of the proximal gastro renal shunt was then  performed utilizing numerous 18, 16, 14 mm detachable Ruby microcoils as well as multiple liquid metal packing coils. Once a dense metal plug was successfully created, the occlusion balloon was slowly deflated under real-time fluoroscopic imaging. The sclerosing mass and coil pack were stable. No evidence of mobilization or flow. The microcatheter was removed. The occlusion balloon was brought back into the left renal vein. Left renal venography was performed confirming persistent patency of the renal vein. No evidence of complication. The sheath and occlusion balloon were removed. Hemostasis was attained with the assistance of manual pressure. IMPRESSION: 1. Successful coil embolization of non bleeding accessory portosystemic collaterals (inferior phrenic an accessory inferior phrenic veins) which was required to successfully treated the bleeding gastric varices. 2. Successful coil assisted balloon occluded transvenous obliteration (CA-BRTO) of gastric varices using liquid sclerosant and detachable coils. PLAN: 1. Maintain bed rest overnight. Clear liquids are okay. Advanced diet as tolerated beginning tomorrow if patient remains stable. 2. BRTO protocol CT scan of the abdomen on Tuesday. 3. Patient will require outpatient follow-up EGD in approximately 6 weeks to assess for new or progressive esophageal varices and to assess the treated gastric varices. 4. Follow-up with Interventional Radiology in 3 months. Signed, Sterling Big, MD, RPVI [facility] [address] Radiology Electronically Signed   By: Malachy Moan M.D.   On: 04/20/2020 15:16   IR Paracentesis  Result Date: 04/21/2020 INDICATION: Patient with a history of hepatic cirrhosis, portal hypertension and gastric varices. Patient is status post BRTO April 20, 2020. Patient presents today for a therapeutic and diagnostic paracentesis. EXAM: ULTRASOUND GUIDED PARACENTESIS MEDICATIONS: 1% lidocaine 10 mL COMPLICATIONS: None immediate. PROCEDURE: Informed written consent was obtained from the patient after a discussion of the risks, benefits and alternatives to treatment. A timeout was performed prior to the initiation of the procedure. Initial ultrasound scanning demonstrates a large amount of ascites within the right lower abdominal quadrant. The right lower abdomen was prepped and draped in the usual sterile fashion. 1% lidocaine was used for local anesthesia. Following this, a 19 gauge, 7-cm, Yueh catheter was introduced. An ultrasound image was saved for documentation purposes. The paracentesis was performed. The catheter was removed and a dressing was applied. The patient tolerated the procedure well without immediate post procedural complication. FINDINGS: A total of approximately 3.3 L of clear yellow fluid was removed. Samples were sent to the laboratory as requested by the clinical team. IMPRESSION: Successful ultrasound-guided paracentesis yielding 3.3 liters of peritoneal fluid. Read by: Alwyn Ren, NP Electronically Signed   By: Marliss Coots MD   On: 04/21/2020 14:07   Scheduled Meds: . pantoprazole  40 mg Intravenous Q12H  . tamsulosin  0.4 mg Oral QPC breakfast   Continuous Infusions: . cefTRIAXone (ROCEPHIN)  IV 2 g (04/21/20 2235)    LOS: 4 days   Time spent:  Azucena Fallen, DO [facility]  If 7PM-7AM, please contact night-coverage [email or web address]  04/22/2020, 7:35 AM

## 2020-04-23 LAB — COMPREHENSIVE METABOLIC PANEL
ALT: 25 U/L (ref 0–44)
AST: 32 U/L (ref 15–41)
Albumin: 2.2 g/dL — ABNORMAL LOW (ref 3.5–5.0)
Alkaline Phosphatase: 99 U/L (ref 38–126)
Anion gap: 7 (ref 5–15)
BUN: 22 mg/dL (ref 8–23)
CO2: 20 mmol/L — ABNORMAL LOW (ref 22–32)
Calcium: 7.7 mg/dL — ABNORMAL LOW (ref 8.9–10.3)
Chloride: 107 mmol/L (ref 98–111)
Creatinine, Ser: 0.97 mg/dL (ref 0.61–1.24)
GFR, Estimated: >60 mL/min (ref >60)
Glucose, Bld: 202 mg/dL — ABNORMAL HIGH (ref 70–99)
Potassium: 3.5 mmol/L (ref 3.5–5.1)
Sodium: 134 mmol/L — ABNORMAL LOW (ref 135–145)
Total Bilirubin: 0.7 mg/dL (ref 0.3–1.2)
Total Protein: 5.4 g/dL — ABNORMAL LOW (ref 6.5–8.1)

## 2020-04-23 LAB — CBC
HCT: 24.8 % — ABNORMAL LOW (ref 39.0–52.0)
Hemoglobin: 8.2 g/dL — ABNORMAL LOW (ref 13.0–17.0)
MCH: 32.5 pg (ref 26.0–34.0)
MCHC: 33.1 g/dL (ref 30.0–36.0)
MCV: 98.4 fL (ref 80.0–100.0)
Platelets: 95 10*3/uL — ABNORMAL LOW (ref 150–400)
RBC: 2.52 MIL/uL — ABNORMAL LOW (ref 4.22–5.81)
RDW: 19.1 % — ABNORMAL HIGH (ref 11.5–15.5)
WBC: 9.9 10*3/uL (ref 4.0–10.5)
nRBC: 0 % (ref 0.0–0.2)

## 2020-04-23 LAB — ANTI-SMOOTH MUSCLE ANTIBODY, IGG: F-Actin IgG: 8 Units (ref 0–19)

## 2020-04-23 LAB — CERULOPLASMIN: Ceruloplasmin: 31.8 mg/dL — ABNORMAL HIGH (ref 16.0–31.0)

## 2020-04-23 LAB — TOTAL BILIRUBIN, BODY FLUID: Total bilirubin, fluid: <0.2 mg/dL

## 2020-04-23 LAB — MITOCHONDRIAL ANTIBODIES: Mitochondrial M2 Ab, IgG: 21.7 Units — ABNORMAL HIGH (ref 0.0–20.0)

## 2020-04-23 LAB — ANA: Anti Nuclear Antibody (ANA): NEGATIVE

## 2020-04-23 MED ORDER — SPIRONOLACTONE 12.5 MG HALF TABLET
12.5000 mg | ORAL_TABLET | Freq: Every day | ORAL | Status: DC
  Administered 2020-04-23 – 2020-04-24 (×2): 12.5 mg via ORAL
  Filled 2020-04-23 (×2): qty 1

## 2020-04-23 MED ORDER — FUROSEMIDE 20 MG PO TABS
20.0000 mg | ORAL_TABLET | Freq: Two times a day (BID) | ORAL | Status: DC
  Administered 2020-04-23 – 2020-04-24 (×3): 20 mg via ORAL
  Filled 2020-04-23 (×3): qty 1

## 2020-04-23 NOTE — Progress Notes (Signed)
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 medical history significant for CVA in 2015, MSSA bacteremia, essential hypertension, hyperlipidemia.    PT Comments    Pt received in supine, agreeable to therapy session and with good participation and tolerance. Pt able to progress gait distance x2 trials with RW and chair follow for safety, pt continuing to require minA for transfers/gait and min guard to minA for bed mobility. Increased time to perform mobility tasks and slow gait speed ~0.2-0.3 m/s indicates increased risk of falls for household ambulation tasks. Seated therex with good tolerance per HEP handout. Pt asking to return to supine as he sat up in chair most of the morning. Pt continues to benefit from PT services to progress toward functional mobility goals. Continue to recommend CIR.   Follow Up Recommendations  CIR     Equipment Recommendations  None recommended by PT    Recommendations for Other Services       Precautions / Restrictions Precautions Precautions: Fall Restrictions Weight Bearing Restrictions: No    Mobility  Bed Mobility Overal bed mobility: Needs Assistance Bed Mobility: Supine to Sit;Sit to Supine     Supine to sit: Min guard Sit to supine: Min assist   General bed mobility comments: min guard with occasional cues, use of rail with UEs as able. Min assist for BLE to return to supine  Transfers Overall transfer level: Needs assistance Equipment used: Rolling walker (2 wheeled) Transfers: Sit to/from Stand Sit to Stand: Min assist          General transfer comment: from EOB x2 reps to RW, cues for safe hand placement needed and increased time needed for full trunk extension, minA for safety with balancing once upright  Ambulation/Gait Ambulation/Gait assistance: Min assist;+2 safety/equipment (chair follow) Gait Distance (Feet): 60 Feet (74ft, seated break, 41ft) Assistive device: Rolling walker (2 wheeled) Gait Pattern/deviations: Decreased stride length;Wide base of support;Trunk flexed;Step-through pattern (decreased foot clearance bilaterally and BLE eversion) Gait velocity: grossly <0.3 m/s   General Gait Details: Still with short steps but able to lengthen stride briefly with cues, needs manual assist to maintain proper proximity to RW/for safety while turning, pt tending to hold RW at arm's length; very slow cadence; HR 90's bpm during gait trial; chair follow for safety   Stairs             Wheelchair Mobility    Modified Rankin (Stroke Patients Only)       Balance Overall balance assessment: Needs assistance Sitting-balance support: No upper extremity supported Sitting balance-Leahy Scale: Good     Standing balance support: Bilateral upper extremity supported Standing balance-Leahy Scale: Poor Standing balance comment: reliant on BUE support of RW for static/dynamic standing tasks                            Cognition Arousal/Alertness: Awake/alert Behavior During Therapy: WFL for tasks assessed/performed Overall Cognitive Status: Within Functional Limits for tasks assessed  General Comments: slow processing at times vs hard of hearing, good following of 1-step cues and increased time to process 2-step cues      Exercises General Exercises - Lower Extremity Ankle Circles/Pumps: AROM;Both;10 reps;Supine Long Arc Quad: Strengthening;Both;10 reps;Seated Hip Flexion/Marching: Strengthening;Both;Seated;10 reps (limited ROM in seated  posture)    General Comments General comments (skin integrity, edema, etc.): HR 86-95 bpm per monitor, RR 21-25 rpm during gait; BP not assessed, pt denies dizziness this date; reports compliance with HEP, handout still in room      Pertinent Vitals/Pain Pain Assessment: No/denies pain Pain Intervention(s): Monitored during session    Home Living                      Prior Function            PT Goals (current goals can now be found in the care plan section) Acute Rehab PT Goals Patient Stated Goal: to get stronger and go home PT Goal Formulation: With patient Time For Goal Achievement: 05/03/20 Potential to Achieve Goals: Good Progress towards PT goals: Progressing toward goals    Frequency    Min 3X/week      PT Plan Current plan remains appropriate    Co-evaluation              AM-PAC PT "6 Clicks" Mobility   Outcome Measure  Help needed turning from your back to your side while in a flat bed without using bedrails?: A Little Help needed moving from lying on your back to sitting on the side of a flat bed without using bedrails?: A Little Help needed moving to and from a bed to a chair (including a wheelchair)?: A Little Help needed standing up from a chair using your arms (e.g., wheelchair or bedside chair)?: A Little Help needed to walk in hospital room?: A Little Help needed climbing 3-5 steps with a railing? : Total 6 Click Score: 16    End of Session Equipment Utilized During Treatment: Gait belt Activity Tolerance: Patient tolerated treatment well Patient left: with call bell/phone within reach;in bed;with bed alarm set (per RN OK to have more ice, ice placed in his cup) Nurse Communication: Mobility status PT Visit Diagnosis: Muscle weakness (generalized) (M62.81);Difficulty in walking, not elsewhere classified (R26.2)     Time: 0630-1601 PT Time Calculation (min) (ACUTE ONLY): 28 min  Charges:  $Gait Training: 8-22 mins $Therapeutic  Activity: 8-22 mins                     Elijah [name] P., PTA Acute Rehabilitation Services Pager: [phone] Office: [phone]   [place] 04/23/2020, 3:42 PM

## 2020-04-23 NOTE — Progress Notes (Signed)
Referring Physician(s): Dr. Natale Milch  Supervising Physician: Marliss Coots  Patient Status:  [facility] - In-pt  Chief Complaint: Bleeding gastric varices; S/p coil-assisted balloon-occluded transvenous obliteration of massive gastric varices (CA-BRTO). Draining inferior phrenic and accessory inferior phrenic veins also embolized.   Subjective:  Resting comfortably in bed.  Just ordered dinner.  Denies further bleeding.  Denies abdominal distention s/p recent paracentesis.    Allergies: Patient has no known allergies.  Medications: Prior to Admission medications   Medication Sig Start Date End Date Taking? Authorizing Provider  atorvastatin (LIPITOR) 40 MG tablet Take 40 mg by mouth daily. 07/30/13   [provider]  cefTRIAXone 2 g in sodium chloride 0.9 % 100 mL Inject 2 g into the vein daily. 04/18/20   Schertz, Shari Heritage, MD  glimepiride (AMARYL) 4 MG tablet Take 4 mg by mouth daily. 08/17/13   [provider]  metFORMIN (GLUCOPHAGE) 500 MG tablet Take 500 mg by mouth 2 (two) times daily with a meal. 08/08/13   [provider]  octreotide 500 mcg in sodium chloride 0.9 % 250 mL Inject 50 mcg/hr into the vein continuous. 04/18/20   Schertz, Shari Heritage, MD  pantoprazole (PROTONIX) 40 MG injection Inject 40 mg into the vein every 12 (twelve) hours. 04/21/20   Schertz, Shari Heritage, MD  pioglitazone (ACTOS) 30 MG tablet Take 30 mg by mouth daily. 07/30/13   [provider]  tamsulosin (FLOMAX) 0.4 MG CAPS capsule Take 0.4 mg by mouth.    [provider]  insulin detemir (LEVEMIR) 100 UNIT/ML injection Inject 20 Units into the skin at bedtime.  11/11/18  [provider]  metoprolol tartrate (LOPRESSOR) 25 MG tablet Take 25 mg by mouth 3 (three) times daily.   11/11/18  [provider]     Vital Signs: BP 117/70 (BP Location: Left Arm)   Pulse 78   Temp 97.6 F (36.4 C) (Oral)   Resp 17   Ht 6\' 3"  (1.905 m)   Wt 205 lb 14.6 oz  (93.4 kg)   SpO2 98%   BMI 25.74 kg/m   Physical Exam  NAD, alert Neck: R IJ access site intact. Dressing in place.  Abdomen: soft, non distended. Inguinal hernias noted.   Imaging: DG Chest 2 View  Result Date: 04/17/2020 CLINICAL DATA:  Patient with weakness. EXAM: CHEST - 2 VIEW COMPARISON:  Chest radiograph 09/11/2013. FINDINGS: Stable cardiomegaly. Large layering left pleural effusion with underlying consolidation. No pneumothorax. Thoracic spine degenerative changes. IMPRESSION: Large layering left pleural effusion with underlying consolidation. Electronically Signed   By: 09/13/2013 M.D.   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 He presents today for balloon occluded transvenous obliteration of gastric varices. EXAM: 1. Ultrasound-guided access right internal jugular vein 2. Catheterization and venogram, left renal vein 3. Catheterization and venogram of gastro renal shunt 4. Catheterization and venogram of left inferior phrenic vein 5. Coil embolization of left inferior phrenic vein 6. Catheterization of venogram of accessory left inferior phrenic vein 7. Coil embolization of accessory left inferior phrenic vein 8. Embolization of gastric varices and gastro renal shunt MEDICATIONS: None. ANESTHESIA/SEDATION: 2 mg Versed and 100 mcg fentanyl were administered intravenously. Moderate Sedation Time:  64 minutes The patient was continuously monitored during the  procedure by the interventional radiology nurse under my direct supervision. CONTRAST:  120 mL Omnipaque 350 FLUOROSCOPY TIME:  Fluoroscopy Time: 40 minutes 30 seconds (1472 mGy). COMPLICATIONS: None immediate. PROCEDURE: Informed written consent was obtained from  the patient after a thorough discussion of the procedural risks, benefits and alternatives. All questions were addressed. Maximal Sterile Barrier Technique was utilized including caps, mask, sterile gowns, sterile gloves, sterile drape, hand hygiene and skin antiseptic. A timeout was performed prior to the initiation of the procedure. The right internal jugular vein was interrogated with ultrasound and found to be widely patent. An image was obtained and stored for the medical record. Local anesthesia was attained by infiltration with 1% lidocaine. A small dermatotomy was made. Under real-time sonographic guidance, the vessel was punctured with a 21 gauge micropuncture needle. Using standard technique, the initial micro needle was exchanged over a 0.018 micro wire for a transitional 4 [place] micro sheath. The micro sheath was then exchanged over a 0.035 wire for a 10 French fascial dilator and the soft tissue tract was dilated. Next, a 10 French tips sheath was carefully advanced over the wire and into the inferior vena cava. An angled 5 French catheter was then advanced over the wire. The catheter was used to select the left renal vein. A left renal venogram was performed. Standard venous anatomy. No evidence of left renal venous thrombus. A rose in wire was parked in an inferior branch of the left renal vein. The tips sheath was then advanced into the origin of the renal vein. Additional left renal venography was performed. The inflow of the gastro renal shunt was successfully identified along the superior margin of the left renal vein. The angled catheter was introduced through the tips sheath coaxially adjacent to the stabilizing Rosen wire. Utilizing a Glidewire, the catheter was successfully advanced into proximal left gastro renal shunt. Venography was again performed. The renal shunt is robust. The expected web near the origin was identified. Flow direction is toward the renal vein. The Glidewire and  catheter were advanced deeper into the gastro renal shunt. The glidewire was exchanged for a superstiff Amplatz wire. The 5 French catheter was removed. Next, attempts were made to advance several occlusion balloons into the gastro renal shunt. Ultimately, a 7 French Merci balloon occlusion catheter was successfully advanced into the gastro renal shunt. The occlusion balloon was inflated and pulled snug against the web near the gastro renal shunt origin. Balloon occluded venography was then performed. The gastro renal shunt is quite massive. At least 2 large draining veins are identified escaping the gastro renal shunt and draining into the systemic system. These appear to represent the inferior phrenic vein and a second accessory inferior phrenic vein. Coil embolization of these non bleeding vessels will likely be required in order to obtain stasis within the recently hemorrhaged gastric varices. A penumbra lantern microcatheter was advanced coaxially through the balloon occlusion catheter over a 0.016 fathom wire. The catheter was successfully used to select the inferior phrenic vein. The catheter was advanced into the inferior phrenic vein and venography was performed confirming catheter location. The inferior phrenic vein does drain into the systemic symptom and provide an outlet from the gastro renal shunt. Coil embolization was then performed using a series of 3 and 4 mm soft detachable Ruby coils. The microcatheter was brought back into the main body of the gastro renal shunt. The catheter was carefully advanced deeper into the gastro renal shunt and then used to successfully catheterize  the next major escape vessel. The catheter was advanced into the vessel and venography performed. This appears to be an accessory inferior phrenic vein which drains into the azygos and hemi azygous system. This vessel must be sacrificed in order to proceed with obliteration of the target gastric varices. Therefore, coil  embolization was performed utilizing a combination of 3, 4 and 5 mm Ruby detachable coils. The microcatheter was then brought back into the gastro renal shunt. Additional venography was performed, this time with carbon dioxide gas. The carbon dioxide gas successfully opacifies the entirety of the gastric variceal network. The afferent vein from the portal venous system appears to be the posterior gastric vein. Sclerosis and mixture was then prepared in the standard fashion (3: 2 : 1 mixture of air : 3% sodium tetradecyl Sulfate : Lipiodol). The sclerosis mixture was then instilled in aliquots through the lantern microcatheter filling the gastro renal shunt and ultimately the gastric varices. Injection was stopped when sclerosis and began to spill into the posterior gastric vein. Approximately 10 mL 3% sodium tetradecyl Sulfate and 4 mL Lipiodol were administered. The microcatheter was then brought back into the proximal gastro renal shunt just beyond the occlusion balloon. Coil embolization of the proximal gastro renal shunt was then performed utilizing numerous 18, 16, 14 mm detachable Ruby microcoils as well as multiple liquid metal packing coils. Once a dense metal plug was successfully created, the occlusion balloon was slowly deflated under real-time fluoroscopic imaging. The sclerosing mass and coil pack were stable. No evidence of mobilization or flow. The microcatheter was removed. The occlusion balloon was brought back into the left renal vein. Left renal venography was performed confirming persistent patency of the renal vein. No evidence of complication. The sheath and occlusion balloon were removed. Hemostasis was attained with the assistance of manual pressure. IMPRESSION: 1. Successful coil embolization of non bleeding accessory portosystemic collaterals (inferior phrenic an accessory inferior phrenic veins) which was required to successfully treated the bleeding gastric varices. 2. Successful coil  assisted balloon occluded transvenous obliteration (CA-BRTO) of gastric varices using liquid sclerosant and detachable coils. PLAN: 1. Maintain bed rest overnight. Clear liquids are okay. Advanced diet as tolerated beginning tomorrow if patient remains stable. 2. BRTO protocol CT scan of the abdomen on Tuesday. 3. Patient will require outpatient follow-up EGD in approximately 6 weeks to assess for new or progressive esophageal varices and to assess the treated gastric varices. 4. Follow-up with Interventional Radiology in 3 months. Signed, Sterling Big, MD, RPVI [facility] Radiology Electronically Signed   By: Malachy Moan M.D.   On: 04/20/2020 15:16    US Paracentesis  Result Date: 04/18/2020 INDICATION: Patient with newly found cirrhosis, ascites. Request is made for diagnostic paracentesis. EXAM: ULTRASOUND GUIDED DIAGNOSTIC PARACENTESIS MEDICATIONS: 10 mL 1% lidocaine COMPLICATIONS: None immediate. PROCEDURE: Informed written consent was obtained from the patient after a discussion of the risks, benefits and alternatives to treatment. A timeout was performed prior to the initiation of the procedure. Initial ultrasound scanning demonstrates a small amount of ascites within the right lower abdominal quadrant. The right lower abdomen was prepped and draped in the usual sterile fashion. 1% lidocaine was used for local anesthesia. Following this, a 19 gauge, 7-cm, Yueh catheter was introduced. An ultrasound image was saved for documentation purposes. The paracentesis was performed. The catheter was removed and a dressing was applied. The patient tolerated the procedure well without immediate post procedural complication. FINDINGS: A total of approximately 61  mL of pale, clear fluid was removed. Samples were sent to the laboratory as requested by the clinical team. IMPRESSION: Successful ultrasound-guided paracentesis yielding 60 mL of peritoneal  fluid. Read by: Loyce Dys PA-C Electronically Signed   By: Acquanetta Belling M.D.   On: 04/18/2020 15:35   ECHOCARDIOGRAM COMPLETE  Result Date: 04/19/2020    ECHOCARDIOGRAM REPORT   Patient Name:   Elijah [name] Keathley Date of Exam: 04/19/2020 Medical Rec #:  [id number]        Height:       75.0 in Accession #:    [id number]       Weight:       175.0 lb Date of Birth:  [birth year]        BSA:          2.074 m Patient Age:    86 years         BP:           112/58 mmHg Patient Gender: M                HR:           78 bpm. Exam Location:  Inpatient Procedure: 2D Echo Indications:    elevated troponin  History:        Patient has prior history of Echocardiogram examinations, most                 recent 09/04/2013. Arrythmias:Atrial Fibrillation.  Sonographer:    Delcie Roch Referring Phys: [id number] Azucena Fallen  Sonographer Comments: Image acquisition challenging due to respiratory motion. IMPRESSIONS  1. Left ventricular ejection fraction, by estimation, is 55 to 60%. The left ventricle has normal function. The left ventricle demonstrates regional wall motion abnormalities (see scoring diagram/findings for description). Left ventricular diastolic parameters are consistent with Grade I diastolic dysfunction (impaired relaxation).  2. Right ventricular systolic function is normal. The right ventricular size is normal.  3. Left atrial size was mildly dilated.  4. Moderate pleural effusion in the left lateral region.  5. The mitral valve is degenerative. Moderate mitral valve regurgitation. No evidence of mitral stenosis.  6. The aortic valve is calcified. There is moderate calcification of the aortic valve. There is moderate thickening of the aortic valve. Aortic valve regurgitation is moderate. Moderate aortic valve stenosis. Aortic valve area, by VTI measures 1.17 cm.  Aortic valve mean gradient measures 16.0 mmHg. Aortic valve Vmax measures 3.03 m/s.  7. The inferior vena cava is normal in size with greater  than 50% respiratory variability, suggesting right atrial pressure of 3 mmHg. FINDINGS  Left Ventricle: Left ventricular ejection fraction, by estimation, is 55 to 60%. The left ventricle has normal function. The left ventricle demonstrates regional wall motion abnormalities. The left ventricular internal cavity size was normal in size. There is no left ventricular hypertrophy. Left ventricular diastolic parameters are consistent with Grade I diastolic dysfunction (impaired relaxation).  LV Wall Scoring: The apex is akinetic. Right Ventricle: The right ventricular size is normal. No increase in right ventricular wall thickness. Right ventricular systolic function is normal. Left Atrium: Left atrial size was mildly dilated. Right Atrium: Right atrial size was normal in size. Pericardium: There is no evidence of pericardial effusion. Mitral Valve: The mitral valve is degenerative in appearance. There is mild thickening of the mitral valve leaflet(s). There is mild calcification of the mitral valve leaflet(s). Moderate mitral valve regurgitation. No evidence of mitral valve stenosis. Tricuspid Valve: The tricuspid valve is normal in  structure. Tricuspid valve regurgitation is mild . No evidence of tricuspid stenosis. Aortic Valve: The aortic valve is calcified. There is moderate calcification of the aortic valve. There is moderate thickening of the aortic valve. Aortic valve regurgitation is moderate. Moderate aortic stenosis is present. Aortic valve mean gradient measures 16.0 mmHg. Aortic valve peak gradient measures 36.8 mmHg. Aortic valve area, by VTI measures 1.17 cm. Pulmonic Valve: The pulmonic valve was normal in structure. Pulmonic valve regurgitation is not visualized. No evidence of pulmonic stenosis. Aorta: The aortic root is normal in size and structure. Venous: The inferior vena cava is normal in size with greater than 50% respiratory variability, suggesting right atrial pressure of 3 mmHg. IAS/Shunts:  No atrial level shunt detected by color flow Doppler. Additional Comments: There is a moderate pleural effusion in the left lateral region.  LEFT VENTRICLE PLAX 2D LVIDd:         5.20 cm LVIDs:         4.10 cm LV PW:         1.00 cm LV IVS:        1.00 cm LVOT diam:     1.80 cm LV SV:         65 LV SV Index:   31 LVOT Area:     2.54 cm  RIGHT VENTRICLE             IVC RV S prime:     12.50 cm/s  IVC diam: 2.10 cm TAPSE (M-mode): 1.9 cm LEFT ATRIUM             Index       RIGHT ATRIUM           Index LA diam:        4.10 cm 1.98 cm/m  RA Area:     17.30 cm LA Vol (A2C):   74.9 ml 36.12 ml/m RA Volume:   41.80 ml  20.16 ml/m LA Vol (A4C):   69.0 ml 33.28 ml/m LA Biplane Vol: 72.7 ml 35.06 ml/m  AORTIC VALVE AV Area (Vmax):    1.01 cm AV Area (Vmean):   1.08 cm AV Area (VTI):     1.17 cm AV Vmax:           303.33 cm/s AV Vmean:          184.000 cm/s AV VTI:            0.554 m AV Peak Grad:      36.8 mmHg AV Mean Grad:      16.0 mmHg LVOT Vmax:         120.00 cm/s LVOT Vmean:        77.800 cm/s LVOT VTI:          0.255 m LVOT/AV VTI ratio: 0.46  AORTA Ao Root diam: 3.30 cm  SHUNTS Systemic VTI:  0.25 m Systemic Diam: 1.80 cm Donato SchultzMark Skains MD Electronically signed by Donato SchultzMark Skains MD Signature Date/Time: 04/19/2020/4:01:15 PM    Final    CT Angio Abd/Pel W and/or Wo Contrast  Result Date: 04/17/2020 CLINICAL DATA:  GI bleeding. EXAM: CTA ABDOMEN AND PELVIS WITHOUT AND WITH CONTRAST TECHNIQUE: Multidetector CT imaging of the abdomen and pelvis was performed using the standard protocol during bolus administration of intravenous contrast. Multiplanar reconstructed images and MIPs were obtained and reviewed to evaluate the vascular anatomy. CONTRAST:  100mL OMNIPAQUE IOHEXOL 350 MG/ML SOLN COMPARISON:  None. FINDINGS: VASCULAR Aorta: Normal caliber aorta without aneurysm, dissection, vasculitis or significant  stenosis. Atherosclerotic changes are noted throughout the abdominal aorta. Celiac: Patent without evidence  of aneurysm, dissection, vasculitis or significant stenosis. SMA: Patent without evidence of aneurysm, dissection, vasculitis or significant stenosis. Renals: Both renal arteries are patent without evidence of aneurysm, dissection, vasculitis, fibromuscular dysplasia or significant stenosis. There are 2 right renal arteries. IMA: Patent without evidence of aneurysm, dissection, vasculitis or significant stenosis. Inflow: Patent without evidence of aneurysm, dissection, vasculitis or significant stenosis. Proximal Outflow: Bilateral common femoral and visualized portions of the superficial and profunda femoral arteries are patent without evidence of aneurysm, dissection, vasculitis or significant stenosis. Veins: No obvious venous abnormality within the limitations of this arterial phase study. Review of the MIP images confirms the above findings. NON-VASCULAR Lower chest: There is a large left-sided pleural effusion that is only partially visualized. There is at least partial collapse of the left lower lobe.The heart size is normal. The intracardiac blood pool is hypodense relative to the adjacent myocardium consistent with anemia. Hepatobiliary: The liver is cirrhotic. There are large gastric varices with a gastro renal shunt. The portal vein is patent. There are esophageal varices. Normal gallbladder.There is no biliary ductal dilation. Pancreas: The pancreas is atrophic. Again noted is a low-attenuation, cystic appearing nodule at the pancreatic tail which is not substantially changed since 2015 is likely a benign process. Spleen: Unremarkable. Adrenals/Urinary Tract: --Adrenal glands: Unremarkable. --Right kidney/ureter: No hydronephrosis or radiopaque kidney stones. --Left kidney/ureter: No hydronephrosis or radiopaque kidney stones. --Urinary bladder: The urinary bladder is significantly distended. Stomach/Bowel: --Stomach/Duodenum: Large gastric varices are noted. --Small bowel: Unremarkable. --Colon:  Rectosigmoid diverticulosis without acute inflammation. --Appendix: Normal. Lymphatic: --No retroperitoneal lymphadenopathy. --No mesenteric lymphadenopathy. --No pelvic or inguinal lymphadenopathy. Reproductive: There are large bilateral inguinal hernias. On the left the inguinal hernia contains a large amount of ascites and portions of the sigmoid colon without evidence for obstruction. On the right, the hernia contains mostly fluid but a small portion of small bowel as well without evidence for obstruction. Other: There is a moderate to large volume of ascites in the abdomen. Musculoskeletal. Multilevel degenerative changes are noted throughout the lumbar spine. IMPRESSION: 1. No evidence for active arterial GI bleeding. 2. Cirrhosis with stigmata of portal hypertension including a moderate volume ascites and large gastric varices with a gastrorenal shunt. Small esophageal varices are also noted. The portal vein is patent. Splenic vein is patent. 3. Large partially visualized left-sided pleural effusion with collapse of the left lower lobe. 4. Very large bilateral inguinal hernias containing a large volume of ascites and bowel as detailed above. Aortic Atherosclerosis (ICD10-I70.0). Electronically Signed   By: Katherine Mantle M.D.   On: 04/17/2020 18:38    Labs:  CBC: Recent Labs    04/18/20 1436 04/18/20 1951 04/19/20 0055 04/19/20 0606 04/20/20 0308 04/21/20 0136  WBC 10.6*  --   --  9.2 7.8 8.9  HGB 7.2*   < > 8.6* 8.2* 8.1* 8.4*  HCT 21.4*   < > 24.4* 23.5* 24.2* 25.4*  PLT 131*  --   --  123* 109* 100*   < > = values in this interval not displayed.    COAGS: Recent Labs    04/17/20 1714  INR 1.6*    BMP: Recent Labs    09/29/19 1218 04/17/20 1419 04/18/20 0515 04/19/20 0606 04/20/20 0308 04/21/20 0136  NA 135   < > 143 143 145 141  K 4.3   < > 4.4 3.9 3.7 3.7  CL 104   < >  111 112* 114* 112*  CO2 17*   < > 19* 22 22 19*  GLUCOSE 276*   < > 156* 137* 136* 163*  BUN  26*   < > 64* 53* 45* 35*  CALCIUM 9.5   < > 8.6* 8.3* 8.1* 7.9*  CREATININE 1.43*   < > 1.11 1.21 1.20 1.06  GFRNONAA 45*   < > >60 59* 60* >60  GFRAA 52*  --   --   --   --   --    < > = values in this interval not displayed.    LIVER FUNCTION TESTS: Recent Labs    04/17/20 1419 04/18/20 0515  BILITOT 1.1 0.8  AST 63* 60*  ALT 33 31  ALKPHOS 110 93  PROT 6.5 5.8*  ALBUMIN 2.5* 2.4*    Assessment and Plan: Bleeding gastric varices; S/p coil-assisted balloon-occluded transvenous obliteration of massive gastric varices (CA-BRTO). Draining inferior phrenic and accessory inferior phrenic veins also embolized.  Remains stable post-procedure.  HgB 8.2 Tolerating meals. No distention today. Procedures site stable, intact. Dressing can be removed.  CTA Abdomen Pelvis obtained and reviewed by Dr. Miles Costain who notes the gastric varices and gastro renal shunt appear occluded.  Repeat CT BRTO in 3 months at IR follow-up.   IR available as needed.   Loyce Dys, MS RD PA-C    I spent a total of 15 Minutes at the the patient's bedside AND on the patient's hospital floor or unit, greater than 50% of which was counseling/coordinating care for follow up care GI bleed.

## 2020-04-23 NOTE — Progress Notes (Signed)
Inpatient Rehabilitation Admissions Coordinator   OT eval obtained. I will begin insurance authorization with [facility] medicare for possible Cir admit penidng approval when patient medically ready for d/c.  Ottie Glazier, RN, MSN Rehab Admissions Coordinator [phone] 04/23/2020 3:31 PM

## 2020-04-23 NOTE — Progress Notes (Signed)
PROGRESS NOTE    Elijah [name]  ZOX:[id number] DOB: 02/23/1934 DOA: 04/18/2020 PCP: Jaclyn Shaggyate, Denny C, MD   Brief Narrative:  Elijah CardJames A Muratore is a 87 y.o. male with medical history significant for CVA in 2015, MSSA bacteremia, essential hypertension, hyperlipidemia, type 2 diabetes who initially presented to [facility] from home on 04/17/2020 due to 4 days of melena, weakness, fatigue, chest pain, and shortness of breath.  Work-up revealed symptomatic blood loss anemia with initial hemoglobin of 5.7K and NSTEMI.  Admitted to the ICU initially, received 3u PRBC to maintain hemoglobin.  Patient evaluated by GI, ICU, cardiology at [facility]. Currently on Protonix drip, received octreotide and Rocephin after CT angio abdomen and pelvis showed cirrhotic liver morphology with large gastric varices. EGD done on 04/18/2020 which showed very large gastric varix without any active bleeding but with evidence of recent bleeding.  Small nonbleeding superficial varices were also noted.  He also had diagnostic paracentesis, no evidence of SBP. Seen by cardiology felt that his uptrending troponins and EKG abnormalities were consistent with NSTEMI in the setting of acute blood loss anemia.  Recommended echocardiogram as well as beta-blockade when able to tolerate; nitro for symptomatic chest pain. GI recommended evaluation for TIPS or BRTO given high risk for rebleeding from the morphology of the size of the gastric varix and to transfer to [facility] for IR guided TIPS.  1/30 Echocardiogram without overt findings per IR will likely proceed with TIPS and CA-BRTO procedure later today pending schedule. 1/31 Tolerated procedure well - patient will need EGD 6 weeks; IR f/up in 3months - 3.3L paracentesis 2/1 CTA shows complete occlusion of gastric varices 2/2 advancing diet, ongoing ascites, will initiate and escalate diuretics(Lasix/spironolactone) as appropriate/holding propranolol given borderline low blood pressure  Assessment &  Plan:  Newly diagnosed cirrhosis of the liver with large acutely bleeding gastric varices Associated ascites Se[facility] by [facility], [facility] given availability GI and IR following: TIPS with BRTO 04/21/20 - tolerated well. Paracentesis 04/22/09 - 3.3 L, SAAG = >1 consistent with ascites due to portal hypertension CTA abdomen for post procedure evaluation pending Initiate spironolactone and Lasix at low-dose, hold propranolol in the setting of hypotension.  Follow I's and O's, will escalate treatment based on urine output over the next 24 hours.  Acute blood loss anemia secondary to upper GI bleed post EGD on 04/18/2020. Present[facility] ED with complaints of melena 4 days, with hemoglobin of 5.7 on presentation.  Status post 3u PRBC transfusion since admission EGD 04/18/2020 showed very large gastric varix without any active bleeding but with evidence of recent bleeding.  Small nonbleeding superficial varices were also noted.  Status post TIPS/BRTO (see above) Protonix/octreotide drips discontinued  NSTEMI in the setting of marked acute blood loss anemia. Presented with hemoglobin of 5.7K, baseline hemoglobin 10. Troponin has peaked at greater than 3700 and downtrending appropriately. Remains asymptomatic, cardiology discussion for preoperative clearance, they concur with previous note by Dr. Juliann Paresallwood at [facility], pending echocardiogram  Acute onset ascites from worsening cirrhosis IR following, likely require multiple paracentesis for symptom control during hospital stay Continue Lasix, spironolactone Paracentesis 04/22/2020 successful for 3L without complication, continue to follow clinically  Type 2 diabetes controlled, well controlled Lab Results  Component Value Date   HGBA1C 5.4 04/18/2020  Hold off home oral antiglycemic's Start insulin sliding scale and hypoglycemic protocol Consider de-escalation of home antiglycemic's in the setting of tightly  controlled glucose  Isolated elevated AST Avoid hepatotoxic  agents Hold off on statin for now  BPH Resume home Flomax Monitor urine output  Physical debility/Ambulatory dysfunction PT to assess Fall precautions.  DVT prophylaxis: SCDs; avoid chemical prophylaxis given above Code Status: DNR confirmed at admission Family Communication: None available  Status is: Inpatient  Dispo: The patient is from: Home              Anticipated d/c is to: CIR              Anticipated d/c date is: 48-72h              Patient currently not medically stable for discharge  Consultants:   PCCM, IR, GI, cardiology  Procedures:   Upper endoscopy 04/18/2020  TIPS/BRTO pending clearance  Antimicrobials:  Ceftriaxone  Subjective: No acute issues or events overnight, patient denies nausea vomiting diarrhea constipation headache fevers or chills.  Ascites still present, denies any ongoing abdominal pain, feels less "full" than yesterday  Objective: Vitals:   04/22/20 2010 04/23/20 0016 04/23/20 0400 04/23/20 0645  BP: 112/74 100/69 (!) 101/55   Pulse: 86  75   Resp: 20 18 17 17   Temp: 98.1 F (36.7 C) 98 F (36.7 C) 97.9 F (36.6 C)   TempSrc: Oral Oral Oral   SpO2: 98% 97% 97%   Weight:    89.8 kg  Height:        Intake/Output Summary (Last 24 hours) at 04/23/2020 0759 Last data filed at 04/22/2020 2010 Gross per 24 hour  Intake 240 ml  Output -  Net 240 ml   Filed Weights   04/21/20 0500 04/22/20 0647 04/23/20 0645  Weight: 93.4 kg 87.4 kg 89.8 kg    Examination:  General:  Pleasantly resting in bed, No acute distress. HEENT:  Normocephalic atraumatic.  Sclerae nonicteric, noninjected.  Extraocular movements intact bilaterally. Neck:  Without mass or deformity.  Trachea is midline. Lungs:  Clear to auscultate bilaterally without rhonchi, wheeze, or rales. Heart:  Regular rate and rhythm.  Without murmurs, rubs, or gallops. Abdomen:  Soft, nontender, moderately  distended, nontympanic.  Without guarding or rebound. Extremities: Without cyanosis, clubbing, edema, or obvious deformity. Vascular:  Dorsalis pedis and posterior tibial pulses palpable bilaterally. Skin:  Warm and dry, no erythema, no ulcerations.   Data Reviewed: I have personally reviewed following labs and imaging studies  CBC: Recent Labs  Lab 04/18/20 1436 04/18/20 1951 04/19/20 0606 04/20/20 0308 04/21/20 0136 04/22/20 0114 04/23/20 0126  WBC 10.6*  --  9.2 7.8 8.9 8.1 9.9  NEUTROABS 8.9*  --   --   --   --   --   --   HGB 7.2*   < > 8.2* 8.1* 8.4* 7.6* 8.2*  HCT 21.4*   < > 23.5* 24.2* 25.4* 24.6* 24.8*  MCV 97.3  --  96.3 97.2 99.6 101.2* 98.4  PLT 131*  --  123* 109* 100* 90* 95*   < > = values in this interval not displayed.   Basic Metabolic Panel: Recent Labs  Lab 04/18/20 0515 04/19/20 0606 04/20/20 0308 04/21/20 0136 04/22/20 0114 04/23/20 0126  NA 143 143 145 141 137 134*  K 4.4 3.9 3.7 3.7 3.5 3.5  CL 111 112* 114* 112* 109 107  CO2 19* 22 22 19* 21* 20*  GLUCOSE 156* 137* 136* 163* 191* 202*  BUN 64* 53* 45* 35* 24* 22  CREATININE 1.11 1.21 1.20 1.06 0.93 0.97  CALCIUM 8.6* 8.3* 8.1* 7.9* 7.9* 7.7*  MG 1.8  --   --   --   --   --  PHOS 3.9  --   --   --   --   --    GFR: Estimated Creatinine Clearance: 67.8 mL/min (by C-G formula based on SCr of 0.97 mg/dL). Liver Function Tests: Recent Labs  Lab 04/17/20 1419 04/18/20 0515 04/22/20 0114 04/23/20 0126  AST 63* 60* 39 32  ALT 33 31 27 25   ALKPHOS 110 93 90 99  BILITOT 1.1 0.8 1.0 0.7  PROT 6.5 5.8* 5.5* 5.4*  ALBUMIN 2.5* 2.4* 2.4* 2.2*   No results for input(s): LIPASE, AMYLASE in the last 168 hours. Recent Labs  Lab 04/18/20 0500  AMMONIA 65*   Coagulation Profile: Recent Labs  Lab 04/17/20 1714  INR 1.6*   Cardiac Enzymes: No results for input(s): CKTOTAL, CKMB, CKMBINDEX, TROPONINI in the last 168 hours. BNP (last 3 results) No results for input(s): PROBNP in the last  8760 hours. HbA1C: No results for input(s): HGBA1C in the last 72 hours. CBG: Recent Labs  Lab 04/18/20 0644 04/18/20 1303  GLUCAP 147* 136*   Lipid Profile: No results for input(s): CHOL, HDL, LDLCALC, TRIG, CHOLHDL, LDLDIRECT in the last 72 hours. Thyroid Function Tests: No results for input(s): TSH, T4TOTAL, FREET4, T3FREE, THYROIDAB in the last 72 hours. Anemia Panel: No results for input(s): VITAMINB12, FOLATE, FERRITIN, TIBC, IRON, RETICCTPCT in the last 72 hours. Sepsis Labs: Recent Labs  Lab 04/17/20 1827 04/17/20 1931 04/18/20 0515 04/18/20 1951 04/19/20 0606  PROCALCITON 0.24  --  0.29  --   --   LATICACIDVEN  --  9.3* 5.0* 2.3* 1.4    Recent Results (from the past 240 hour(s))  SARS Coronavirus 2 by RT PCR (hospital order, performed in [facility] hospital lab) Nasopharyngeal Nasopharyngeal Swab     Status: None   Collection Time: 04/17/20  5:14 PM   Specimen: Nasopharyngeal Swab  Result Value Ref Range Status   SARS Coronavirus 2 NEGATIVE NEGATIVE Final    Comment: (NOTE) SARS-CoV-2 target nucleic acids are NOT DETECTED.  The SARS-CoV-2 RNA is generally detectable in upper and lower respiratory specimens during the acute phase of infection. The lowest concentration of SARS-CoV-2 viral copies this assay can detect is 250 copies / mL. A negative result does not preclude SARS-CoV-2 infection and should not be used as the sole basis for treatment or other patient management decisions.  A negative result may occur with improper specimen collection / handling, submission of specimen other than nasopharyngeal swab, presence of viral mutation(s) within the areas targeted by this assay, and inadequate number of viral copies (<250 copies / mL). A negative result must be combined with clinical observations, patient history, and epidemiological information.  Fact Sheet for Patients:   04/19/20  Fact Sheet for Healthcare  Providers: BoilerBrush.com.cy  This test is not yet approved or  cleared by the https://pope.com/ FDA and has been authorized for detection and/or diagnosis of SARS-CoV-2 by FDA under an Emergency Use Authorization (EUA).  This EUA will remain in effect (meaning this test can be used) for the duration of the COVID-19 declaration under Section 564(b)(1) of the Act, 21 U.S.C. section 360bbb-3(b)(1), unless the authorization is terminated or revoked sooner.  Performed at [facility], [address] [place], [place]   Culture, blood (routine x 2)     Status: None   Collection Time: 04/17/20  6:27 PM   Specimen: BLOOD  Result Value Ref Range Status   Specimen Description BLOOD BLOOD LEFT FOREARM  Final   Special Requests  Final    BOTTLES DRAWN AEROBIC AND ANAEROBIC Blood Culture adequate volume   Culture   Final    NO GROWTH 5 DAYS Performed at [facility], [address] [place], [place] [address]    Report Status 04/22/2020 FINAL  Final  Culture, blood (routine x 2)     Status: None   Collection Time: 04/17/20  6:31 PM   Specimen: BLOOD  Result Value Ref Range Status   Specimen Description BLOOD BLOOD RIGHT WRIST  Final   Special Requests   Final    BOTTLES DRAWN AEROBIC AND ANAEROBIC Blood Culture results may not be optimal due to an inadequate volume of blood received in culture bottles   Culture   Final    NO GROWTH 5 DAYS Performed at [facility], [address], [place], [place] [address]    Report Status 04/22/2020 FINAL  Final  Aerobic/Anaerobic Culture (surgical/deep wound)     Status: None (Preliminary result)   Collection Time: 04/18/20  3:33 PM   Specimen: PATH Cytology Peritoneal fluid  Result Value Ref Range Status   Specimen Description   Final    PERITONEAL Performed at [facility], [address] [place], [place] [address]    Special Requests   Final    PERITONEAL Performed at  [facility], [address] [place], [place] [address]    Gram Stain   Final    FEW WBC PRESENT, PREDOMINANTLY MONONUCLEAR NO ORGANISMS SEEN    Culture   Final    NO GROWTH 4 DAYS NO ANAEROBES ISOLATED; CULTURE IN PROGRESS FOR 5 DAYS Performed at [facility], [address], [place], [place] [address]    Report Status PENDING  Incomplete         Radiology Studies: CT Angio Abd/Pel w/ and/or w/o  Result Date: 04/22/2020 CLINICAL DATA:  Cryptogenic cirrhosis, ascites, large gastric varices status post acute gastric variceal bleed. Status post BRTO EXAM: CT ANGIOGRAPHY ABDOMEN AND PELVIS WITH CONTRAST AND WITHOUT CONTRAST TECHNIQUE: Multidetector CT imaging of the abdomen and pelvis was performed using the standard protocol during bolus administration of intravenous contrast. Multiplanar reconstructed images and MIPs were obtained and reviewed to evaluate the vascular anatomy. CONTRAST:  OMNIPAQUE IOHEXOL 350 MG/ML SOLN COMPARISON:  04/17/2020 FINDINGS: VASCULAR Aorta: Negative for aneurysm or dissection. No occlusive process. Aortic atherosclerosis again noted. No retroperitoneal hemorrhage, hematoma, or evidence of rupture. Celiac: Remains patent including its branches. No evidence of active arterial bleeding in the celiac territory. SMA: Remains widely patent including its branches. No active arterial bleeding in the SMA vascular territory. Renals: Atherosclerotic origins with mild ostial narrowings but no significant renal artery stenosis appreciated by CTA. IMA: IMA remains patent off the distal aorta including its branches. Inflow: Tortuous and atherosclerotic iliac vasculature without dissection, aneurysm, occlusive disease. No inflow disease. Proximal Outflow: Atherosclerotic changes but the common femoral, proximal profunda femoral, and proximal superficial femoral arteries demonstrated are all patent. Veins: There are small amounts of radiopaque embolic material in  the main portal vein and also in the SMV. There is also nonocclusive hypoechoic thrombus in the portal SMV confluence. These changes are best demonstrated on images 28 through 40 of series 8. The main portal vein, left and right portal veins, splenic and mesenteric veins remain patent. Hepatic veins are all patent. No other abdominopelvic veno-occlusive process. Review of the MIP images confirms the above findings. NON-VASCULAR Lower chest: Similar large left effusion with left lower lobe collapse/consolidation.  Heart is enlarged. Coronary atherosclerosis noted. No pericardial effusion. Hepatobiliary: Stable cirrhotic changes of the liver without biliary dilatation or obstruction. Gallbladder is distended with some layering hyperdense material, suspect vicarious contrast excretion. Difficult to exclude gallstones. No focal hepatic abnormality. Common bile duct nondilated. Pancreas: Marked atrophy of the pancreas as before. Stable small cystic lesion of the pancreas tail. Chronic pancreas calcifications noted. No new finding. Spleen: Normal in size without focal abnormality. Adrenals/Urinary Tract: Normal right adrenal gland. Left adrenal gland is obscured by the artifact from the radiopaque BRTO embolic material. Kidneys demonstrate symmetric enhancement without obstruction or hydronephrosis. Ureters are symmetric and decompressed. Right bladder diverticula again noted posteriorly. Excreted contrast noted within the bladder. Stomach/Bowel: Negative for bowel obstruction, significant dilatation, or ileus. Scattered colonic diverticulosis. Status post interval BRTO. Gastric varices and gastro renal shunt appear occluded following the procedure. Similar degree of diffuse abdominopelvic mesenteric edema and small amount of abdominopelvic ascites. Lymphatic: No bulky adenopathy. Reproductive: Very large bilateral inguinal hernias containing similar volume of ascites and bowel loops. No evidence of incarceration. Other:  Intact abdominal wall. No ventral hernia. Similar volume of abdominopelvic ascites. Musculoskeletal: Bones are osteopenic. Degenerative changes of the spine. No acute osseous IMPRESSION: VASCULAR Negative for active arterial GI bleeding. Status post BRTO with complete occlusion of the gastric varices and the gastro renal shunt. Small amount of nonocclusive BRTO embolic material and adjacent nonocclusive venous thrombus at the portal SMV confluence as detailed above. Splenic, mesenteric, portal and hepatic veins all remain patent. NON-VASCULAR Stable large left effusion with left lower lobe collapse/consolidation Hepatic cirrhosis Similar volume of abdominopelvic ascites and mesenteric edema. Diverticulosis Large bilateral inguinal hernias without incarceration Electronically Signed   By: Judie Petit.  Shick M.D.   On: 04/22/2020 16:23   IR Paracentesis  Result Date: 04/21/2020 INDICATION: Patient with a history of hepatic cirrhosis, portal hypertension and gastric varices. Patient is status post BRTO April 20, 2020. Patient presents today for a therapeutic and diagnostic paracentesis. EXAM: ULTRASOUND GUIDED PARACENTESIS MEDICATIONS: 1% lidocaine 10 mL COMPLICATIONS: None immediate. PROCEDURE: Informed written consent was obtained from the patient after a discussion of the risks, benefits and alternatives to treatment. A timeout was performed prior to the initiation of the procedure. Initial ultrasound scanning demonstrates a large amount of ascites within the right lower abdominal quadrant. The right lower abdomen was prepped and draped in the usual sterile fashion. 1% lidocaine was used for local anesthesia. Following this, a 19 gauge, 7-cm, Yueh catheter was introduced. An ultrasound image was saved for documentation purposes. The paracentesis was performed. The catheter was removed and a dressing was applied. The patient tolerated the procedure well without immediate post procedural complication. FINDINGS: A total  of approximately 3.3 L of clear yellow fluid was removed. Samples were sent to the laboratory as requested by the clinical team. IMPRESSION: Successful ultrasound-guided paracentesis yielding 3.3 liters of peritoneal fluid. Read by: Alwyn Ren, NP Electronically Signed   By: Marliss Coots MD   On: 04/21/2020 14:07   Scheduled Meds: . pantoprazole  40 mg Oral BID  . tamsulosin  0.4 mg Oral QPC breakfast   Continuous Infusions: . cefTRIAXone (ROCEPHIN)  IV 2 g (04/22/20 2211)    LOS: 5 days   Time spent:  Azucena Fallen, DO [facility]  If 7PM-7AM, please contact night-coverage [email or web address]  04/23/2020, 7:59 AM

## 2020-04-23 NOTE — Progress Notes (Signed)
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 medical history significant for CVA in 2015, MSSA bacteremia, essential hypertension, hyperlipidemia.   Clinical Impression   Pt with excellent participation with session, reporting that prior to onset of weakness was at home with daughter, who works during the day, was indep for completion of basic ADL's and occasional use of RW for mobility. No falls. Currently pt presents with decreased strength,activity tolerance, balance and ?insight/safety with slowed processing observed. Pt requiring min A at this time for transfers and min-mod A for completion of seated/standing ADL's with noted fatigue. Pt reporting unclear of grand daughter would be available for S/A throughout day. Anticipate pt to benefit significantly from CIR to maximize indep and safety with basic ADL's. OT will continue to follow acutely.     Follow Up Recommendations  CIR;Supervision/Assistance 12-24hr S/A   Equipment Recommendations   (TBD at next level of care)    Recommendations for Other Services Rehab consult     Precautions / Restrictions Precautions Precautions: Fall Restrictions Weight Bearing Restrictions: No      Mobility Bed Mobility Overal bed mobility: Needs Assistance Bed Mobility: Supine to Sit Rolling: Min assist Sidelying to sit: Min assist       General bed mobility comments: Min A for supine>sitting EOB with increased time and cues for technique with reliance on HOB elevated slightly and therapist assist for trunk support for full  transition.    Transfers Overall transfer level: Needs assistance Equipment used: Rolling walker (2 wheeled) Transfers: Sit to/from Stand Sit to Stand: Min assist;From elevated surface         General transfer comment: Min A for full transition to standing from sitting on standard height, cues for safe hand placement to achieve transition with cues for anterior weight shift.    Balance Overall balance assessment: Needs assistance Sitting-balance support: Feet supported;No upper extremity supported Sitting balance-Leahy Scale: Good                                     ADL either performed or assessed with clinical judgement   ADL Overall ADL's : Needs assistance/impaired Eating/Feeding: Set up   Grooming: Minimal assistance   Upper Body Bathing: Minimal assistance   Lower Body Bathing: Moderate assistance;Sit to/from stand   Upper Body Dressing : Minimal assistance   Lower Body Dressing: Moderate assistance;Sit to/from stand   Toilet Transfer: Minimal assistance;RW   Toileting- Clothing Manipulation and Hygiene: Minimal assistance;Sit to/from stand       Functional mobility during ADLs: Minimal assistance;Rolling walker General ADL Comments: Increased time for ADL completion, noted SOB although O2 sats remain WFL throughout session. endorsing some fatigue.     Vision Baseline Vision/History: No visual deficits Vision Assessment?: No apparent visual deficits     Perception     Praxis      Pertinent Vitals/Pain Pain Assessment: No/denies pain Pain Intervention(s): Limited activity within patient's tolerance     Hand Dominance Right   Extremity/Trunk Assessment Upper Extremity Assessment Upper Extremity Assessment: Generalized weakness   Lower  Extremity Assessment Lower Extremity Assessment: Defer to PT evaluation   Cervical / Trunk Assessment Cervical / Trunk Assessment: Kyphotic   Communication Communication Communication: Expressive  difficulties   Cognition Arousal/Alertness: Awake/alert Behavior During Therapy: WFL for tasks assessed/performed Overall Cognitive Status: Within Functional Limits for tasks assessed                                 General Comments: slowed processing, increased time allowed, good following of 1 step commands. limited at times by [facility]   General Comments  Noted BP soft throughout session although no reports of dizziness or fatigue    Exercises     Shoulder Instructions      Home Living Family/patient expects to be discharged to:: Private residence Living Arrangements: Children Available Help at Discharge: Family;Available PRN/intermittently Type of Home: House Home Access: Stairs to enter [employer] of Steps: 3 Entrance Stairs-Rails: Right;Left;Can reach both Home Layout: One level     Bathroom Shower/Tub: Chief Strategy Officer: Standard     Home Equipment: Environmental consultant - 2 wheels;Shower seat;Bedside commode          Prior Functioning/Environment Level of Independence: Needs assistance  Gait / Transfers Assistance Needed: recently transitioned to using RW ADL's / Homemaking Assistance Needed: prior to onset of worsening weakness was indep with basic ADL's recently requiring daughter A for ADL's for safety            OT Problem List: Decreased strength;Decreased range of motion;Decreased activity tolerance;Impaired balance (sitting and/or standing);Decreased cognition;Decreased safety awareness;Decreased knowledge of precautions      OT Treatment/Interventions: Self-care/ADL training;Therapeutic exercise;Energy conservation;DME and/or AE instruction;Therapeutic activities;Cognitive remediation/compensation;Balance training;Patient/family education    OT Goals(Current goals can be found in the care plan section) Acute Rehab OT Goals Patient Stated Goal: to be able to do for myself OT Goal Formulation: With patient Time For Goal  Achievement: 05/07/20 Potential to Achieve Goals: Good ADL Goals Pt Will Perform Upper Body Dressing: with modified independence Pt Will Perform Lower Body Dressing: with modified independence Pt Will Transfer to Toilet: with modified independence Pt Will Perform Toileting - Clothing Manipulation and hygiene: with modified independence  OT Frequency: Min 2X/week   Barriers to D/C:  limited S/A as daughter works during the day       Co-evaluation              AM-PAC OT "6 Clicks" Daily Activity     Outcome Measure Help from another person eating meals?: None Help from another person taking care of personal grooming?: A Little Help from another person toileting, which includes using toliet, bedpan, or urinal?: A Lot Help from another person bathing (including washing, rinsing, drying)?: A Lot Help from another person to put on and taking off regular upper body clothing?: A Little Help from another person to put on and taking off regular lower body clothing?: A Lot 6 Click Score: 16   End of Session Equipment Utilized During Treatment: Gait belt;Rolling walker Nurse Communication: Mobility status  Activity Tolerance: Patient tolerated treatment well Patient left: in chair;with call bell/phone within reach;with chair alarm set  OT Visit Diagnosis: Unsteadiness on feet (R26.81)                Time: 1028-1100 OT Time Calculation (min): 32 min Charges:  OT General Charges $OT Visit: 1 Visit OT Evaluation $OT Eval Low Complexity: 1 Low OT Treatments $Self Care/Home Management :  8-22 mins  Raziyah Vanvleck OTR/L acute rehab services Office: [phone]  Wilhemena Durie 04/23/2020, 11:13 AM

## 2020-04-24 LAB — COMPREHENSIVE METABOLIC PANEL
ALT: 25 U/L (ref 0–44)
AST: 30 U/L (ref 15–41)
Albumin: 2 g/dL — ABNORMAL LOW (ref 3.5–5.0)
Alkaline Phosphatase: 104 U/L (ref 38–126)
Anion gap: 7 (ref 5–15)
BUN: 19 mg/dL (ref 8–23)
CO2: 21 mmol/L — ABNORMAL LOW (ref 22–32)
Calcium: 7.4 mg/dL — ABNORMAL LOW (ref 8.9–10.3)
Chloride: 108 mmol/L (ref 98–111)
Creatinine, Ser: 1.12 mg/dL (ref 0.61–1.24)
GFR, Estimated: >60 mL/min (ref >60)
Glucose, Bld: 184 mg/dL — ABNORMAL HIGH (ref 70–99)
Potassium: 3.3 mmol/L — ABNORMAL LOW (ref 3.5–5.1)
Sodium: 136 mmol/L (ref 135–145)
Total Bilirubin: 0.8 mg/dL (ref 0.3–1.2)
Total Protein: 5.3 g/dL — ABNORMAL LOW (ref 6.5–8.1)

## 2020-04-24 LAB — CBC
HCT: 24.6 % — ABNORMAL LOW (ref 39.0–52.0)
Hemoglobin: 8.3 g/dL — ABNORMAL LOW (ref 13.0–17.0)
MCH: 32.8 pg (ref 26.0–34.0)
MCHC: 33.7 g/dL (ref 30.0–36.0)
MCV: 97.2 fL (ref 80.0–100.0)
Platelets: 105 10*3/uL — ABNORMAL LOW (ref 150–400)
RBC: 2.53 MIL/uL — ABNORMAL LOW (ref 4.22–5.81)
RDW: 18.6 % — ABNORMAL HIGH (ref 11.5–15.5)
WBC: 9.2 10*3/uL (ref 4.0–10.5)
nRBC: 0 % (ref 0.0–0.2)

## 2020-04-24 LAB — AEROBIC/ANAEROBIC CULTURE W GRAM STAIN (SURGICAL/DEEP WOUND): Culture: NO GROWTH

## 2020-04-24 MED ORDER — PROPRANOLOL HCL 10 MG PO TABS
10.0000 mg | ORAL_TABLET | Freq: Two times a day (BID) | ORAL | Status: DC
  Administered 2020-04-24 – 2020-04-27 (×7): 10 mg via ORAL
  Filled 2020-04-24 (×8): qty 1

## 2020-04-24 MED ORDER — FUROSEMIDE 40 MG PO TABS
40.0000 mg | ORAL_TABLET | Freq: Two times a day (BID) | ORAL | Status: DC
  Administered 2020-04-24 – 2020-04-27 (×6): 40 mg via ORAL
  Filled 2020-04-24 (×6): qty 1

## 2020-04-24 MED ORDER — SPIRONOLACTONE 25 MG PO TABS
25.0000 mg | ORAL_TABLET | Freq: Every day | ORAL | Status: DC
  Administered 2020-04-25: 25 mg via ORAL
  Filled 2020-04-24: qty 1

## 2020-04-24 NOTE — H&P (Incomplete)
Physical Medicine and Rehabilitation Admission H&P     HPI: Elijah [name] is an 87 year old right-handed male with history of CVA 2015 receiving inpatient rehab services 09/11/2013 to 10/10/2013, MSSA bacteremia, hypertension, hyperlipidemia, type 2 diabetes mellitus.  Per chart review lives with daughter.  Independent with assistive device.  Daughter works at [employer] during the day.  1 level home 3 steps to entry.  Presented 04/17/2020 to [facility] with melena, weakness and fatigue x4 days as well as bouts of nonspecific chest pain or shortness of breath.  Chemistries unremarkable except glucose 243 BUN 56, anion gap 21, hemoglobin 5.7, platelets 191,000, urinalysis negative nitrite, BNP 441, troponin [id number], lactic acid 9.6, SARS coronavirus negative, procalcitonin 0.24, blood cultures no growth to date.  He received 3 units packed red blood cells.  Placed on Protonix drip.  CT angio of the abdomen and pelvis showed cirrhotic liver morphology with large gastric varices.  Seen by GI/Dr. Rich Reining at [facility] EGD 04/18/2020 showed very large gastric varix without any active bleeding but with evidence of recent bleeding.  Small nonbleeding superficial varices were also noted.  GI recommended evaluation for TIPS or BRTO given high risk for rebleeding from the morphology of the size of the gastric varix.  Cardiology service was consulted Dr. Jeanene Erb EKG consistent with non-STEMI in the setting of acute blood loss.  He was transferred to [facility] for evaluation by interventional radiology for guided TIPS.  Patient underwent successful coil assisted balloon occluded transvenous obliteration of massive gastric varices (CA-BRTO) and draining inferior phrenic and accessory inferior phrenic veins also embolized per interventional radiology Dr. Archer Asa 04/20/2020.  Underwent IR guided paracentesis right abdomen yielding 3.3 L of clear yellow fluid and currently remains  on Rocephin after procedure completed 04/25/2020.  Echocardiogram completed 04/20/2020 showing ejection fraction 55 to 60% grade 1 diastolic dysfunction.  In regards to patient's non-STEMI in the setting of marked acute blood loss anemia troponin trending down and monitored with troponin peaking at greater than 3700.  Patient remained asymptomatic no current plans on anticoagulation.  Hemoglobin remained stable 8.3 and platelets 105,000.  Patient with follow-up CTA abdomen pelvis 04/22/2020 and reviewed by interventional radiology Dr. Denny Levy who notes the gastric varices and gastric renal shunt appeared occluded and plan repeat CT-BRTO in 3 months at IR follow-up therapy evaluations completed due to patient's debility was admitted for a comprehensive rehab program.  Review of Systems  Constitutional: Positive for malaise/fatigue. Negative for fever.  HENT: Positive for hearing loss.   Eyes: Negative for blurred vision and double vision.  Respiratory: Positive for shortness of breath.   Cardiovascular: Positive for chest pain and leg swelling.  Gastrointestinal: Positive for melena and nausea.  Genitourinary: Positive for urgency. Negative for dysuria, flank pain and hematuria.  Musculoskeletal: Positive for joint pain and myalgias.  Skin: Negative for rash.  Neurological: Positive for weakness.  All other systems reviewed and are negative.  Past Medical History:  Diagnosis Date  . Diabetes mellitus without complication [facility])    Past Surgical History:  Procedure Laterality Date  . ESOPHAGOGASTRODUODENOSCOPY (EGD) WITH PROPOFOL N/A 04/18/2020   Procedure: ESOPHAGOGASTRODUODENOSCOPY (EGD) WITH PROPOFOL;  Surgeon: Regis Bill, MD;  Location: [facility] ENDOSCOPY;  Service: Endoscopy;  Laterality: N/A;  . IR ANGIOGRAM SELECTIVE EACH ADDITIONAL VESSEL  04/20/2020  . IR ANGIOGRAM SELECTIVE EACH ADDITIONAL VESSEL  04/20/2020  . IR ANGIOGRAM SELECTIVE EACH ADDITIONAL VESSEL  04/20/2020  . IR EMBO VENOUS  NOT HEMORR HEMANG  INC GUIDE ROADMAPPING  04/20/2020  . IR PARACENTESIS  04/21/2020  . IR US GUIDE VASC ACCESS RIGHT  04/20/2020  . IR VENOGRAM RENAL UNI LEFT  04/20/2020  . SHOULDER SURGERY Right    No family history on file. Social History:  reports that he has never smoked. He has never used smokeless tobacco. He reports that he does not drink alcohol and does not use drugs. Allergies: No Known Allergies Medications Prior to Admission  Medication Sig Dispense Refill  . atorvastatin (LIPITOR) 40 MG tablet Take 40 mg by mouth daily.    . cefTRIAXone 2 g in sodium chloride 0.9 % 100 mL Inject 2 g into the vein daily.    Marland Kitchen glimepiride (AMARYL) 4 MG tablet Take 4 mg by mouth daily.    . metFORMIN (GLUCOPHAGE) 500 MG tablet Take 500 mg by mouth 2 (two) times daily with a meal.    . octreotide 500 mcg in sodium chloride 0.9 % 250 mL Inject 50 mcg/hr into the vein continuous.    . pantoprazole (PROTONIX) 40 MG injection Inject 40 mg into the vein every 12 (twelve) hours.    . pioglitazone (ACTOS) 30 MG tablet Take 30 mg by mouth daily.    . tamsulosin (FLOMAX) 0.4 MG CAPS capsule Take 0.4 mg by mouth.      Drug Regimen Review Drug regimen was reviewed and remains appropriate with no significant issues identified  Home: Home Living Family/patient expects to be discharged to:: Private residence Living Arrangements: Children Available Help at Discharge: Family,Available PRN/intermittently Type of Home: House Home Access: Stairs to enter Secretary/administrator of Steps: 3 Entrance Stairs-Rails: Right,Left,Can reach both Home Layout: One level Bathroom Shower/Tub: Engineer, manufacturing systems: Standard Home Equipment: Environmental consultant - 2 wheels,Shower seat,Bedside commode   Functional History: Prior Function Level of Independence: Needs assistance Gait / Transfers Assistance Needed: recently transitioned to using RW ADL's / Homemaking Assistance Needed: prior to onset of worsening weakness was  indep with basic ADL's recently requiring daughter A for ADL's for safety  Functional Status:  Mobility: Bed Mobility Overal bed mobility: Needs Assistance Bed Mobility: Supine to Sit,Sit to Supine Rolling: Min assist Sidelying to sit: Min assist Supine to sit: Min guard Sit to supine: Min assist Sit to sidelying: Min assist General bed mobility comments: min guard with occasional cues, use of rail with UEs as able. Min assist for BLE to return to supine Transfers Overall transfer level: Needs assistance Equipment used: Rolling walker (2 wheeled) Transfers: Sit to/from Stand Sit to Stand: Min assist General transfer comment: from EOB x2 reps to RW, cues for safe hand placement needed and increased time needed for full trunk extension, minA for safety with balancing once upright Ambulation/Gait Ambulation/Gait assistance: Min assist,+2 safety/equipment (chair follow) Gait Distance (Feet): 60 Feet (54ft, seated break, 73ft) Assistive device: Rolling walker (2 wheeled) Gait Pattern/deviations: Decreased stride length,Wide base of support,Trunk flexed,Step-through pattern (decreased foot clearance bilaterally and BLE eversion) General Gait Details: Still with short steps but able to lengthen stride briefly with cues, needs manual assist to maintain proper proximity to RW/for safety while turning, pt tending to hold RW at arm's length; very slow cadence; HR 90's bpm during gait trial; chair follow for safety Gait velocity: grossly <0.3 m/s Gait velocity interpretation: <1.31 ft/sec, indicative of household ambulator    ADL: ADL Overall ADL's : Needs assistance/impaired Eating/Feeding: Set up Grooming: Minimal assistance Upper Body Bathing: Minimal assistance Lower Body Bathing: Moderate assistance,Sit to/from stand Upper Body Dressing :  Minimal assistance Lower Body Dressing: Moderate assistance,Sit to/from stand Toilet Transfer: Minimal assistance,RW Toileting- Clothing  Manipulation and Hygiene: Minimal assistance,Sit to/from stand Functional mobility during ADLs: Minimal assistance,Rolling walker General ADL Comments: Increased time for ADL completion, noted SOB although O2 sats remain WFL throughout session. endorsing some fatigue.  Cognition: Cognition Overall Cognitive Status: Within Functional Limits for tasks assessed Orientation Level: Oriented X4 Cognition Arousal/Alertness: Awake/alert Behavior During Therapy: WFL for tasks assessed/performed Overall Cognitive Status: Within Functional Limits for tasks assessed General Comments: slow processing at times vs hard of hearing, good following of 1-step cues and increased time to process 2-step cues  Physical Exam: Blood pressure 116/74, pulse 79, temperature 98.1 F (36.7 C), temperature source Oral, resp. rate 20, height 6\' 3"  (1.905 m), weight 89.8 kg, SpO2 97 %. Physical Exam Neurological:     Comments: Patient is alert.  Appears somewhat fatigued.  Oriented x3 and follows commands.     Results for orders placed or performed during the hospital encounter of 04/18/20 (from the past 48 hour(s))  CBC     Status: Abnormal   Collection Time: 04/23/20  1:26 AM  Result Value Ref Range   WBC 9.9 4.0 - 10.5 K/uL   RBC 2.52 (L) 4.22 - 5.81 MIL/uL   Hemoglobin 8.2 (L) 13.0 - 17.0 g/dL   HCT 59.9 (L) 35.7 - 01.7 %   MCV 98.4 80.0 - 100.0 fL   MCH 32.5 26.0 - 34.0 pg   MCHC 33.1 30.0 - 36.0 g/dL   RDW 79.3 (H) 90.3 - 00.9 %   Platelets 95 (L) 150 - 400 K/uL    Comment: Immature Platelet Fraction may be clinically indicated, consider ordering this additional test [id number] CONSISTENT WITH PREVIOUS RESULT    nRBC 0.0 0.0 - 0.2 %    Comment: Performed at [facility], [address] [place], [place] [address]  Comprehensive metabolic panel     Status: Abnormal   Collection Time: 04/23/20  1:26 AM  Result Value Ref Range   Sodium 134 (L) 135 - 145 mmol/L   Potassium 3.5 3.5 - 5.1 mmol/L    Chloride 107 98 - 111 mmol/L   CO2 20 (L) 22 - 32 mmol/L   Glucose, Bld 202 (H) 70 - 99 mg/dL    Comment: Glucose reference range applies only to samples taken after fasting for at least 8 hours.   BUN 22 8 - 23 mg/dL   Creatinine, Ser 5.45 0.61 - 1.24 mg/dL   Calcium 7.7 (L) 8.9 - 10.3 mg/dL   Total Protein 5.4 (L) 6.5 - 8.1 g/dL   Albumin 2.2 (L) 3.5 - 5.0 g/dL   AST 32 15 - 41 U/L   ALT 25 0 - 44 U/L   Alkaline Phosphatase 99 38 - 126 U/L   Total Bilirubin 0.7 0.3 - 1.2 mg/dL   GFR, Estimated >62 >56 mL/min    Comment: (NOTE) Calculated using the CKD-EPI Creatinine Equation (2021)    Anion gap 7 5 - 15    Comment: Performed at [facility], [address] [place], [place] [address]  CBC     Status: Abnormal   Collection Time: 04/24/20  1:30 AM  Result Value Ref Range   WBC 9.2 4.0 - 10.5 K/uL   RBC 2.53 (L) 4.22 - 5.81 MIL/uL   Hemoglobin 8.3 (L) 13.0 - 17.0 g/dL   HCT 34.2 (L) 87.6 - 81.1 %   MCV 97.2 80.0 - 100.0 fL  MCH 32.8 26.0 - 34.0 pg   MCHC 33.7 30.0 - 36.0 g/dL   RDW 16.118.6 (H) 09.611.5 - 04.515.5 %   Platelets 105 (L) 150 - 400 K/uL    Comment: REPEATED TO VERIFY SPECIMEN CHECKED FOR CLOTS Immature Platelet Fraction may be clinically indicated, consider ordering this additional test [id number] CONSISTENT WITH PREVIOUS RESULT    nRBC 0.0 0.0 - 0.2 %    Comment: Performed at [facility], [address] [place], [place] [address]  Comprehensive metabolic panel     Status: Abnormal   Collection Time: 04/24/20  1:30 AM  Result Value Ref Range   Sodium 136 135 - 145 mmol/L   Potassium 3.3 (L) 3.5 - 5.1 mmol/L   Chloride 108 98 - 111 mmol/L   CO2 21 (L) 22 - 32 mmol/L   Glucose, Bld 184 (H) 70 - 99 mg/dL    Comment: Glucose reference range applies only to samples taken after fasting for at least 8 hours.   BUN 19 8 - 23 mg/dL   Creatinine, Ser 5.621.12 0.61 - 1.24 mg/dL   Calcium 7.4 (L) 8.9 - 10.3 mg/dL   Total Protein 5.3 (L) 6.5 - 8.1 g/dL   Albumin  2.0 (L) 3.5 - 5.0 g/dL   AST 30 15 - 41 U/L   ALT 25 0 - 44 U/L   Alkaline Phosphatase 104 38 - 126 U/L   Total Bilirubin 0.8 0.3 - 1.2 mg/dL   GFR, Estimated >13>60 >08>60 mL/min    Comment: (NOTE) Calculated using the CKD-EPI Creatinine Equation (2021)    Anion gap 7 5 - 15    Comment: Performed at [facility], [address] [place], [place] [address]   CT Angio Abd/Pel w/ and/or w/o  Result Date: 04/22/2020 CLINICAL DATA:  Cryptogenic cirrhosis, ascites, large gastric varices status post acute gastric variceal bleed. Status post BRTO EXAM: CT ANGIOGRAPHY ABDOMEN AND PELVIS WITH CONTRAST AND WITHOUT CONTRAST TECHNIQUE: Multidetector CT imaging of the abdomen and pelvis was performed using the standard protocol during bolus administration of intravenous contrast. Multiplanar reconstructed images and MIPs were obtained and reviewed to evaluate the vascular anatomy. CONTRAST:  100mL OMNIPAQUE IOHEXOL 350 MG/ML SOLN COMPARISON:  04/17/2020 FINDINGS: VASCULAR Aorta: Negative for aneurysm or dissection. No occlusive process. Aortic atherosclerosis again noted. No retroperitoneal hemorrhage, hematoma, or evidence of rupture. Celiac: Remains patent including its branches. No evidence of active arterial bleeding in the celiac territory. SMA: Remains widely patent including its branches. No active arterial bleeding in the SMA vascular territory. Renals: Atherosclerotic origins with mild ostial narrowings but no significant renal artery stenosis appreciated by CTA. IMA: IMA remains patent off the distal aorta including its branches. Inflow: Tortuous and atherosclerotic iliac vasculature without dissection, aneurysm, occlusive disease. No inflow disease. Proximal Outflow: Atherosclerotic changes but the common femoral, proximal profunda femoral, and proximal superficial femoral arteries demonstrated are all patent. Veins: There are small amounts of radiopaque embolic material in the main portal vein and also  in the SMV. There is also nonocclusive hypoechoic thrombus in the portal SMV confluence. These changes are best demonstrated on images 28 through 40 of series 8. The main portal vein, left and right portal veins, splenic and mesenteric veins remain patent. Hepatic veins are all patent. No other abdominopelvic veno-occlusive process. Review of the MIP images confirms the above findings. NON-VASCULAR Lower chest: Similar large left effusion with left lower lobe collapse/consolidation. Heart is enlarged. Coronary atherosclerosis noted. No pericardial effusion. Hepatobiliary:  Stable cirrhotic changes of the liver without biliary dilatation or obstruction. Gallbladder is distended with some layering hyperdense material, suspect vicarious contrast excretion. Difficult to exclude gallstones. No focal hepatic abnormality. Common bile duct nondilated. Pancreas: Marked atrophy of the pancreas as before. Stable small cystic lesion of the pancreas tail. Chronic pancreas calcifications noted. No new finding. Spleen: Normal in size without focal abnormality. Adrenals/Urinary Tract: Normal right adrenal gland. Left adrenal gland is obscured by the artifact from the radiopaque BRTO embolic material. Kidneys demonstrate symmetric enhancement without obstruction or hydronephrosis. Ureters are symmetric and decompressed. Right bladder diverticula again noted posteriorly. Excreted contrast noted within the bladder. Stomach/Bowel: Negative for bowel obstruction, significant dilatation, or ileus. Scattered colonic diverticulosis. Status post interval BRTO. Gastric varices and gastro renal shunt appear occluded following the procedure. Similar degree of diffuse abdominopelvic mesenteric edema and small amount of abdominopelvic ascites. Lymphatic: No bulky adenopathy. Reproductive: Very large bilateral inguinal hernias containing similar volume of ascites and bowel loops. No evidence of incarceration. Other: Intact abdominal wall. No  ventral hernia. Similar volume of abdominopelvic ascites. Musculoskeletal: Bones are osteopenic. Degenerative changes of the spine. No acute osseous IMPRESSION: VASCULAR Negative for active arterial GI bleeding. Status post BRTO with complete occlusion of the gastric varices and the gastro renal shunt. Small amount of nonocclusive BRTO embolic material and adjacent nonocclusive venous thrombus at the portal SMV confluence as detailed above. Splenic, mesenteric, portal and hepatic veins all remain patent. NON-VASCULAR Stable large left effusion with left lower lobe collapse/consolidation Hepatic cirrhosis Similar volume of abdominopelvic ascites and mesenteric edema. Diverticulosis Large bilateral inguinal hernias without incarceration Electronically Signed   By: Judie Petit.  Shick M.D.   On: 04/22/2020 16:23       Medical Problem List and Plan: 1.  Debility secondary to acute blood loss anemia secondary to upper GI bleed/newly diagnosed cirrhosis of the liver/non-STEMI  -patient may *** shower  -ELOS/Goals: *** 2.  Antithrombotics: -DVT/anticoagulation: SCDs  -antiplatelet therapy: N/A 3. Pain Management: N/A 4. Mood: Not emotional support  -antipsychotic agents: N/A 5. Neuropsych: This patient is capable of making decisions on his own behalf. 6. Skin/Wound Care: Routine skin checks 7. Fluids/Electrolytes/Nutrition: Routine in and outs with follow-up chemistries 8.  Newly diagnosed cirrhosis of the liver with large acute bleeding gastric varices.  GI and IR following TIPS with BRTO.  Initiated low-dose Aldactone 25 mg daily and Lasix 40 mg twice daily 9.  Non-STEMI in the setting of marked acute blood loss anemia.  Monitor troponin.  No current plan for anticoagulation.  Follow-up cardiology services 10.  Hypertension.  Lasix 40 mg twice daily, Aldactone 25 mg daily.  Monitor with increased mobility 11.  Type 2 diabetes mellitus.  Hemoglobin A1c 5.4.  Patient on Amaryl 4 mg daily, Glucophage 500 mg  twice daily and Actos 30 mg daily PRIOR TO ADMISSION.  Resume as needed 12.  BPH.  Flomax 13.  Isolated elevated AST.  Follow-up chemistries.  Statin remains on hold 14.  History of CVA 2015.  Received inpatient rehab services 09/11/2013 to 10/10/2013  ***  Mcarthur Rossetti Yuliya Nova, PA-C 04/24/2020

## 2020-04-24 NOTE — Progress Notes (Signed)
Inpatient Rehabilitation Admissions Coordinator  I met with patient at bedside I await [facility] Medicare approval for a possible CIR admit. I spoke with son, Alvester Chou, by phone. We discussed goals and expectations of a possible CIR admit. He is in agreement. I will follow up once I get a determination from insurance.  Danne Baxter, RN, MSN Rehab Admissions Coordinator [phone] 04/24/2020 1:03 PM

## 2020-04-24 NOTE — Progress Notes (Signed)
PROGRESS NOTE    Elijah [name]  WUJ:[id number] DOB: [birth year] DOA: 04/18/2020 PCP: Jaclyn Shaggy, MD   Brief Narrative:  Elijah [name] is a 87 y.o. male with medical history significant for CVA in 2015, MSSA bacteremia, essential hypertension, hyperlipidemia, type 2 diabetes who initially presented to [facility] from home on 04/17/2020 due to 4 days of melena, weakness, fatigue, chest pain, and shortness of breath.  Work-up revealed symptomatic blood loss anemia with initial hemoglobin of 5.7K and NSTEMI.  Admitted to the ICU initially, received 3u PRBC to maintain hemoglobin.  Patient evaluated by GI, ICU, cardiology at [facility]. Currently on Protonix drip, received octreotide and Rocephin after CT angio abdomen and pelvis showed cirrhotic liver morphology with large gastric varices. EGD done on 04/18/2020 which showed very large gastric varix without any active bleeding but with evidence of recent bleeding.  Small nonbleeding superficial varices were also noted.  He also had diagnostic paracentesis, no evidence of SBP. Seen by cardiology felt that his uptrending troponins and EKG abnormalities were consistent with NSTEMI in the setting of acute blood loss anemia.  Recommended echocardiogram as well as beta-blockade when able to tolerate; nitro for symptomatic chest pain. GI recommended evaluation for TIPS or BRTO given high risk for rebleeding from the morphology of the size of the gastric varix and to transfer to [facility] for IR guided TIPS.  1/30 Echocardiogram without overt findings per IR will likely proceed with TIPS and CA-BRTO procedure later today pending schedule. 1/31 Tolerated procedure well - patient will need EGD 6 weeks; IR f/up in 81months - 3.3L paracentesis 2/1 CTA shows complete occlusion of gastric varices 2/2 advancing diet, ongoing ascites, will initiate and escalate diuretics(Lasix/spironolactone) as appropriate/holding propranolol given borderline low blood pressure 2/3 continue to  increase Lasix and spironolactone, will add low-dose propranolol today given improvement in patient's blood pressure  Assessment & Plan:  Newly diagnosed cirrhosis of the liver with large acutely bleeding gastric varices Associated ascites GI and IR following: TIPS with BRTO 04/21/20 - tolerated well. Paracentesis 04/22/09 - 3.3 L, SAAG = >1 consistent with ascites due to portal hypertension CTA abdomen post procedure shows improvement in varices Increase both spironolactone and Lasix; add low-dose propranolol  Acute blood loss anemia secondary to upper GI bleed post EGD on 04/18/2020. Presented initially to [facility] ED with complaints of melena 4 days, with hemoglobin of 5.7 on presentation.  Status post 3u PRBC transfusion since admission EGD 04/18/2020 showed very large gastric varix without any active bleeding but with evidence of recent bleeding.  Small nonbleeding superficial varices were also noted.  Status post TIPS/BRTO (see above) Protonix/octreotide drips discontinued  NSTEMI in the setting of marked acute blood loss anemia. Presented with hemoglobin of 5.7K, baseline hemoglobin 10. Troponin has peaked at greater than 3700 and downtrending appropriately. Remains asymptomatic, cardiology discussion for preoperative clearance, they concur with previous note by Dr. Juliann Pares at [facility], pending echocardiogram  Acute onset ascites from worsening cirrhosis IR following, likely require multiple paracentesis for symptom control during hospital stay Continue Lasix, spironolactone Paracentesis 04/22/2020 successful for 3L without complication, continue to follow clinically  Type 2 diabetes controlled, well controlled Lab Results  Component Value Date   HGBA1C 5.4 04/18/2020  Hold off home oral antiglycemic's Start insulin sliding scale and hypoglycemic protocol Consider de-escalation of home antiglycemic's in the setting of tightly controlled glucose  Isolated elevated AST Avoid  hepatotoxic agents Hold off on statin for now  BPH Resume home Flomax Monitor urine output  Physical debility/Ambulatory dysfunction PT to assess Fall precautions.  DVT prophylaxis: SCDs; avoid chemical prophylaxis given above Code Status: DNR confirmed at admission Family Communication: None available  Status is: Inpatient  Dispo: The patient is from: Home              Anticipated d/c is to: CIR              Anticipated d/c date is: 24-48h              Patient currently not medically stable for discharge  Consultants:   PCCM, IR, GI, cardiology  Procedures:   Upper endoscopy 04/18/2020  TIPS/BRTO pending clearance  Antimicrobials:  Ceftriaxone  Subjective: No acute issues or events overnight, patient denies nausea vomiting diarrhea constipation headache fevers or chills.  Minimal ascites today states his abdominal fullness is moderately improving compared to yesterday, increased urine output noted as well.  Objective: Vitals:   04/23/20 2137 04/24/20 0010 04/24/20 0433 04/24/20 0758  BP: 116/61 106/69 96/63 116/74  Pulse: 78 74 81 79  Resp: 19 16 19 20   Temp: 97.8 F (36.6 C) 97.9 F (36.6 C) 98.1 F (36.7 C) 98.1 F (36.7 C)  TempSrc: Oral Oral Oral Oral  SpO2: 99% 97% 95% 97%  Weight:      Height:        Intake/Output Summary (Last 24 hours) at 04/24/2020 0802 Last data filed at 04/24/2020 0300 Gross per 24 hour  Intake 540 ml  Output -  Net 540 ml   Filed Weights   04/21/20 0500 04/22/20 0647 04/23/20 0645  Weight: 93.4 kg 87.4 kg 89.8 kg    Examination:  General:  Pleasantly resting in bed, No acute distress. HEENT:  Normocephalic atraumatic.  Sclerae nonicteric, noninjected.  Extraocular movements intact bilaterally. Neck:  Without mass or deformity.  Trachea is midline. Lungs:  Clear to auscultate bilaterally without rhonchi, wheeze, or rales. Heart:  Regular rate and rhythm.  Without murmurs, rubs, or gallops. Abdomen:  Soft, nontender,  moderately distended, nontympanic.  Without guarding or rebound. Extremities: Without cyanosis, clubbing, edema, or obvious deformity. Vascular:  Dorsalis pedis and posterior tibial pulses palpable bilaterally. Skin:  Warm and dry, no erythema, no ulcerations.   Data Reviewed: I have personally reviewed following labs and imaging studies  CBC: Recent Labs  Lab 04/18/20 1436 04/18/20 1951 04/20/20 0308 04/21/20 0136 04/22/20 0114 04/23/20 0126 04/24/20 0130  WBC 10.6*   < > 7.8 8.9 8.1 9.9 9.2  NEUTROABS 8.9*  --   --   --   --   --   --   HGB 7.2*   < > 8.1* 8.4* 7.6* 8.2* 8.3*  HCT 21.4*   < > 24.2* 25.4* 24.6* 24.8* 24.6*  MCV 97.3   < > 97.2 99.6 101.2* 98.4 97.2  PLT 131*   < > 109* 100* 90* 95* 105*   < > = values in this interval not displayed.   Basic Metabolic Panel: Recent Labs  Lab 04/18/20 0515 04/19/20 0606 04/20/20 0308 04/21/20 0136 04/22/20 0114 04/23/20 0126 04/24/20 0130  NA 143   < > 145 141 137 134* 136  K 4.4   < > 3.7 3.7 3.5 3.5 3.3*  CL 111   < > 114* 112* 109 107 108  CO2 19*   < > 22 19* 21* 20* 21*  GLUCOSE 156*   < > 136* 163* 191* 202* 184*  BUN 64*   < > 45* 35* 24* 22 19  CREATININE 1.11   < > 1.20 1.06 0.93 0.97 1.12  CALCIUM 8.6*   < > 8.1* 7.9* 7.9* 7.7* 7.4*  MG 1.8  --   --   --   --   --   --   PHOS 3.9  --   --   --   --   --   --    < > = values in this interval not displayed.   GFR: Estimated Creatinine Clearance: 58.7 mL/min (by C-G formula based on SCr of 1.12 mg/dL). Liver Function Tests: Recent Labs  Lab 04/17/20 1419 04/18/20 0515 04/22/20 0114 04/23/20 0126 04/24/20 0130  AST 63* 60* 39 32 30  ALT 33 31 27 25 25   ALKPHOS 110 93 90 99 104  BILITOT 1.1 0.8 1.0 0.7 0.8  PROT 6.5 5.8* 5.5* 5.4* 5.3*  ALBUMIN 2.5* 2.4* 2.4* 2.2* 2.0*   No results for input(s): LIPASE, AMYLASE in the last 168 hours. Recent Labs  Lab 04/18/20 0500  AMMONIA 65*   Coagulation Profile: Recent Labs  Lab 04/17/20 1714  INR  1.6*   Cardiac Enzymes: No results for input(s): CKTOTAL, CKMB, CKMBINDEX, TROPONINI in the last 168 hours. BNP (last 3 results) No results for input(s): PROBNP in the last 8760 hours. HbA1C: No results for input(s): HGBA1C in the last 72 hours. CBG: Recent Labs  Lab 04/18/20 0644 04/18/20 1303  GLUCAP 147* 136*   Lipid Profile: No results for input(s): CHOL, HDL, LDLCALC, TRIG, CHOLHDL, LDLDIRECT in the last 72 hours. Thyroid Function Tests: No results for input(s): TSH, T4TOTAL, FREET4, T3FREE, THYROIDAB in the last 72 hours. Anemia Panel: No results for input(s): VITAMINB12, FOLATE, FERRITIN, TIBC, IRON, RETICCTPCT in the last 72 hours. Sepsis Labs: Recent Labs  Lab 04/17/20 1827 04/17/20 1931 04/18/20 0515 04/18/20 1951 04/19/20 0606  PROCALCITON 0.24  --  0.29  --   --   LATICACIDVEN  --  9.3* 5.0* 2.3* 1.4    Recent Results (from the past 240 hour(s))  SARS Coronavirus 2 by RT PCR (hospital order, performed in [facility] hospital lab) Nasopharyngeal Nasopharyngeal Swab     Status: None   Collection Time: 04/17/20  5:14 PM   Specimen: Nasopharyngeal Swab  Result Value Ref Range Status   SARS Coronavirus 2 NEGATIVE NEGATIVE Final    Comment: (NOTE) SARS-CoV-2 target nucleic acids are NOT DETECTED.  The SARS-CoV-2 RNA is generally detectable in upper and lower respiratory specimens during the acute phase of infection. The lowest concentration of SARS-CoV-2 viral copies this assay can detect is 250 copies / mL. A negative result does not preclude SARS-CoV-2 infection and should not be used as the sole basis for treatment or other patient management decisions.  A negative result may occur with improper specimen collection / handling, submission of specimen other than nasopharyngeal swab, presence of viral mutation(s) within the areas targeted by this assay, and inadequate number of viral copies (<250 copies / mL). A negative result must be combined with  clinical observations, patient history, and epidemiological information.  Fact Sheet for Patients:   04/19/20  Fact Sheet for Healthcare Providers: BoilerBrush.com.cy  This test is not yet approved or  cleared by the https://pope.com/ FDA and has been authorized for detection and/or diagnosis of SARS-CoV-2 by FDA under an Emergency Use Authorization (EUA).  This EUA will remain in effect (meaning this test can be used) for the duration of the COVID-19 declaration under Section 564(b)(1) of the Act, 21 U.S.C. section 360bbb-3(b)(1), unless  the authorization is terminated or revoked sooner.  Performed at [facility], [address] [place], [place] [address]   Culture, blood (routine x 2)     Status: None   Collection Time: 04/17/20  6:27 PM   Specimen: BLOOD  Result Value Ref Range Status   Specimen Description BLOOD BLOOD LEFT FOREARM  Final   Special Requests   Final    BOTTLES DRAWN AEROBIC AND ANAEROBIC Blood Culture adequate volume   Culture   Final    NO GROWTH 5 DAYS Performed at [facility], [address] [place], [place] [address]    Report Status 04/22/2020 FINAL  Final  Culture, blood (routine x 2)     Status: None   Collection Time: 04/17/20  6:31 PM   Specimen: BLOOD  Result Value Ref Range Status   Specimen Description BLOOD BLOOD RIGHT WRIST  Final   Special Requests   Final    BOTTLES DRAWN AEROBIC AND ANAEROBIC Blood Culture results may not be optimal due to an inadequate volume of blood received in culture bottles   Culture   Final    NO GROWTH 5 DAYS Performed at [facility], [address] [place], [place] [address]    Report Status 04/22/2020 FINAL  Final  Aerobic/Anaerobic Culture (surgical/deep wound)     Status: None (Preliminary result)   Collection Time: 04/18/20  3:33 PM   Specimen: PATH Cytology Peritoneal fluid  Result Value Ref Range Status   Specimen  Description   Final    PERITONEAL Performed at [facility], [address], [place], [place] [address]    Special Requests   Final    PERITONEAL Performed at [facility], [address] [place], [place] [address]    Gram Stain   Final    FEW WBC PRESENT, PREDOMINANTLY MONONUCLEAR NO ORGANISMS SEEN    Culture   Final    NO GROWTH 4 DAYS NO ANAEROBES ISOLATED; CULTURE IN PROGRESS FOR 5 DAYS Performed at [facility], [address] [place], [place] [address]    Report Status PENDING  Incomplete         Radiology Studies: CT Angio Abd/Pel w/ and/or w/o  Result Date: 04/22/2020 CLINICAL DATA:  Cryptogenic cirrhosis, ascites, large gastric varices status post acute gastric variceal bleed. Status post BRTO EXAM: CT ANGIOGRAPHY ABDOMEN AND PELVIS WITH CONTRAST AND WITHOUT CONTRAST TECHNIQUE: Multidetector CT imaging of the abdomen and pelvis was performed using the standard protocol during bolus administration of intravenous contrast. Multiplanar reconstructed images and MIPs were obtained and reviewed to evaluate the vascular anatomy. CONTRAST:  OMNIPAQUE IOHEXOL 350 MG/ML SOLN COMPARISON:  04/17/2020 FINDINGS: VASCULAR Aorta: Negative for aneurysm or dissection. No occlusive process. Aortic atherosclerosis again noted. No retroperitoneal hemorrhage, hematoma, or evidence of rupture. Celiac: Remains patent including its branches. No evidence of active arterial bleeding in the celiac territory. SMA: Remains widely patent including its branches. No active arterial bleeding in the SMA vascular territory. Renals: Atherosclerotic origins with mild ostial narrowings but no significant renal artery stenosis appreciated by CTA. IMA: IMA remains patent off the distal aorta including its branches. Inflow: Tortuous and atherosclerotic iliac vasculature without dissection, aneurysm, occlusive disease. No inflow disease. Proximal Outflow: Atherosclerotic changes but the  common femoral, proximal profunda femoral, and proximal superficial femoral arteries demonstrated are all patent. Veins: There are small amounts of radiopaque embolic material in the main portal vein and also in the SMV. There is also  nonocclusive hypoechoic thrombus in the portal SMV confluence. These changes are best demonstrated on images 28 through 40 of series 8. The main portal vein, left and right portal veins, splenic and mesenteric veins remain patent. Hepatic veins are all patent. No other abdominopelvic veno-occlusive process. Review of the MIP images confirms the above findings. NON-VASCULAR Lower chest: Similar large left effusion with left lower lobe collapse/consolidation. Heart is enlarged. Coronary atherosclerosis noted. No pericardial effusion. Hepatobiliary: Stable cirrhotic changes of the liver without biliary dilatation or obstruction. Gallbladder is distended with some layering hyperdense material, suspect vicarious contrast excretion. Difficult to exclude gallstones. No focal hepatic abnormality. Common bile duct nondilated. Pancreas: Marked atrophy of the pancreas as before. Stable small cystic lesion of the pancreas tail. Chronic pancreas calcifications noted. No new finding. Spleen: Normal in size without focal abnormality. Adrenals/Urinary Tract: Normal right adrenal gland. Left adrenal gland is obscured by the artifact from the radiopaque BRTO embolic material. Kidneys demonstrate symmetric enhancement without obstruction or hydronephrosis. Ureters are symmetric and decompressed. Right bladder diverticula again noted posteriorly. Excreted contrast noted within the bladder. Stomach/Bowel: Negative for bowel obstruction, significant dilatation, or ileus. Scattered colonic diverticulosis. Status post interval BRTO. Gastric varices and gastro renal shunt appear occluded following the procedure. Similar degree of diffuse abdominopelvic mesenteric edema and small amount of abdominopelvic  ascites. Lymphatic: No bulky adenopathy. Reproductive: Very large bilateral inguinal hernias containing similar volume of ascites and bowel loops. No evidence of incarceration. Other: Intact abdominal wall. No ventral hernia. Similar volume of abdominopelvic ascites. Musculoskeletal: Bones are osteopenic. Degenerative changes of the spine. No acute osseous IMPRESSION: VASCULAR Negative for active arterial GI bleeding. Status post BRTO with complete occlusion of the gastric varices and the gastro renal shunt. Small amount of nonocclusive BRTO embolic material and adjacent nonocclusive venous thrombus at the portal SMV confluence as detailed above. Splenic, mesenteric, portal and hepatic veins all remain patent. NON-VASCULAR Stable large left effusion with left lower lobe collapse/consolidation Hepatic cirrhosis Similar volume of abdominopelvic ascites and mesenteric edema. Diverticulosis Large bilateral inguinal hernias without incarceration Electronically Signed   By: Judie PetitM.  Shick M.D.   On: 04/22/2020 16:23   Scheduled Meds: . furosemide  20 mg Oral BID  . pantoprazole  40 mg Oral BID  . spironolactone  12.5 mg Oral Daily  . tamsulosin  0.4 mg Oral QPC breakfast   Continuous Infusions: . cefTRIAXone (ROCEPHIN)  IV Stopped (04/23/20 2214)    LOS: 6 days   Time spent: 40min  Azucena FallenWilliam C Amanie Mcculley, DO [facility]  If 7PM-7AM, please contact night-coverage [email or web address]  04/24/2020, 8:02 AM

## 2020-04-25 LAB — COMPREHENSIVE METABOLIC PANEL
ALT: 27 U/L (ref 0–44)
AST: 41 U/L (ref 15–41)
Albumin: 1.9 g/dL — ABNORMAL LOW (ref 3.5–5.0)
Alkaline Phosphatase: 135 U/L — ABNORMAL HIGH (ref 38–126)
Anion gap: 9 (ref 5–15)
BUN: 17 mg/dL (ref 8–23)
CO2: 24 mmol/L (ref 22–32)
Calcium: 7.6 mg/dL — ABNORMAL LOW (ref 8.9–10.3)
Chloride: 104 mmol/L (ref 98–111)
Creatinine, Ser: 1.15 mg/dL (ref 0.61–1.24)
GFR, Estimated: >60 mL/min (ref >60)
Glucose, Bld: 177 mg/dL — ABNORMAL HIGH (ref 70–99)
Potassium: 3.7 mmol/L (ref 3.5–5.1)
Sodium: 137 mmol/L (ref 135–145)
Total Bilirubin: 0.9 mg/dL (ref 0.3–1.2)
Total Protein: 5.3 g/dL — ABNORMAL LOW (ref 6.5–8.1)

## 2020-04-25 LAB — CBC
HCT: 27.4 % — ABNORMAL LOW (ref 39.0–52.0)
Hemoglobin: 8.8 g/dL — ABNORMAL LOW (ref 13.0–17.0)
MCH: 31.9 pg (ref 26.0–34.0)
MCHC: 32.1 g/dL (ref 30.0–36.0)
MCV: 99.3 fL (ref 80.0–100.0)
Platelets: 130 10*3/uL — ABNORMAL LOW (ref 150–400)
RBC: 2.76 MIL/uL — ABNORMAL LOW (ref 4.22–5.81)
RDW: 18.6 % — ABNORMAL HIGH (ref 11.5–15.5)
WBC: 10.5 10*3/uL (ref 4.0–10.5)
nRBC: 0.2 % (ref 0.0–0.2)

## 2020-04-25 MED ORDER — SPIRONOLACTONE 25 MG PO TABS
25.0000 mg | ORAL_TABLET | Freq: Once | ORAL | Status: AC
  Administered 2020-04-25: 25 mg via ORAL
  Filled 2020-04-25: qty 1

## 2020-04-25 MED ORDER — SPIRONOLACTONE 25 MG PO TABS
50.0000 mg | ORAL_TABLET | Freq: Every day | ORAL | Status: DC
Start: 2020-04-26 — End: 2020-04-27
  Administered 2020-04-26 – 2020-04-27 (×2): 50 mg via ORAL
  Filled 2020-04-25 (×2): qty 2

## 2020-04-25 NOTE — Progress Notes (Signed)
Inpatient Rehabilitation Admissions Coordinator   I have received a denial from [facility] medicare for CIR admit after peer to peer completed with Fransico Him MD and Dr. Natale Milch. I contacted pt's son by phone and he is aware and would like to pursue SNF. I will meet with patient today to inform him of the denial. Acute team and TOC made aware. We will sign off at this time.  Ottie Glazier, RN, MSN Rehab Admissions Coordinator [phone] 04/25/2020 11:28 AM

## 2020-04-25 NOTE — TOC Initial Note (Signed)
Transition of Care [facility]) - Initial/Assessment Note    Patient Details  Name: Elijah [name] MRN: [id number] Date of Birth: [birth year]  Transition of Care [facility]) CM/SW Contact:    Elijah [name], LCSWA Phone Number: 04/25/2020, 12:23 PM  Clinical Narrative:                  CSW spoke with patient's son,Elijah [name] by phone. CSW introduced self and explained role. He is agreeable to short term rehab at [facility]. Preferred SNF is [facility] but he was agreeable to sending referrals to other SNFs in the [place], [place] and [place], area. Patient has received covid vaccine. Family states no questions or concerns at this time.   CSW contacted [facility]- they will review but states no availability until Monday. CSW will provide bed offers as they come available CSW will start insurance authorization once bed offer is confirmed CSW will continue to follow and assist with discharge planning.  Antony Blackbird, MSW, LCSW Clinical Social Worker   Expected Discharge Plan: Skilled Nursing Facility Barriers to Discharge: SNF Pending bed offer,Insurance Authorization (covid test needed before d/c to SNF)   Patient Goals and CMS Choice        Expected Discharge Plan and Services Expected Discharge Plan: Skilled Nursing Facility In-house Referral: Clinical Social Work                                            Prior Living Arrangements/Services   Lives with:: Self Patient language and need for interpreter reviewed:: No        Need for Family Participation in Patient Care: Yes (Comment) Care giver support system in place?: Yes (comment)   Criminal Activity/Legal Involvement Pertinent to Current Situation/Hospitalization: No - Comment as needed  Activities of Daily Living      Permission Sought/Granted Permission sought to share information with : Family Supports    Share Information with NAME: Deangleo Passage  Permission granted to share info w AGENCY:  SNFs  Permission granted to share info w Relationship: son  Permission granted to share info w Contact Information: [phone]  Emotional Assessment       Orientation: : Oriented to Self,Oriented to Place,Oriented to  Time,Oriented to Situation Alcohol / Substance Use: Not Applicable Psych Involvement: No (comment)  Admission diagnosis:  GIB (gastrointestinal bleeding) [K92.2] Patient Active Problem List   Diagnosis Date Noted  . NSTEMI (non-ST elevated myocardial infarction) (HCC) 04/18/2020  . Blood loss anemia 04/18/2020  . Cirrhosis of liver (HCC) 04/18/2020  . GIB (gastrointestinal bleeding) 04/17/2020  . CVA (cerebral infarction) 09/11/2013   PCP:  Jaclyn Shaggy, MD Pharmacy:   [facility] [address] [place] - [address] [place] [address] Phone: [phone] Fax: [phone]     Social Determinants of Health (SDOH) Interventions    Readmission Risk Interventions No flowsheet data found.

## 2020-04-25 NOTE — Progress Notes (Signed)
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 medical history significant for CVA in 2015, MSSA bacteremia, essential hypertension, hyperlipidemia.    PT Comments    Pt supine on arrival, agreeable to therapy session with encouragement, with good participation and fair tolerance for mobility. Pt quick to fatigue, performed household distance ambulation task but needing min/modA this session due to unsteadiness and poor RW management. Pt agreeable to sit up in chair for lunch, chair alarm pad activated for safety. Pt continues to benefit from PT services to progress toward functional mobility goals. Continue to recommend CIR.    Follow Up Recommendations  CIR     Equipment Recommendations  None recommended by PT    Recommendations for Other Services       Precautions / Restrictions Precautions Precautions: Fall Restrictions Weight Bearing Restrictions: No    Mobility  Bed Mobility Overal bed mobility: Needs Assistance Bed Mobility: Supine to Sit     Supine to sit: Min guard     General bed mobility comments: min guard with occasional cues, use of rail with UEs as able. Remained up in chair  Transfers Overall transfer level: Needs assistance Equipment used: Rolling walker (2 wheeled) Transfers: Sit to/from Stand Sit to Stand: Min assist;Mod assist;From elevated surface         General transfer comment: from EOB needs three attempts to reach upright at RW, also needed bed elevated; cues for safe hand placement needed and increased time needed for full trunk extension,  minA for safety with balancing once upright; modA for initial transfer then minA to stand from chair>RW  Ambulation/Gait Ambulation/Gait assistance: Min assist;Mod assist Gait Distance (Feet): 40 Feet Assistive device: Rolling walker (2 wheeled) Gait Pattern/deviations: Decreased stride length;Wide base of support;Trunk flexed;Step-through pattern (decreased foot clearance bilaterally and BLE eversion) Gait velocity: grossly <0.3 m/s   General Gait Details: Still with short steps but able to lengthen stride briefly with cues, needs manual assist to maintain proper proximity to RW/for safety while turning, pt tending to hold RW at arm's length; very slow cadence; HR 90's bpm during gait trial; pt moderately fatigued after ambulation in room and deferred second trial.   Stairs             Wheelchair Mobility    Modified Rankin (Stroke Patients Only)       Balance Overall balance assessment: Needs assistance Sitting-balance support: No upper extremity supported Sitting balance-Leahy Scale: Good     Standing balance support: Bilateral upper extremity supported Standing balance-Leahy Scale: Poor Standing balance comment: reliant on BUE support of RW for static/dynamic standing tasks, minor LOB during gait needing modA at times for safety/RW mgmt                            Cognition Arousal/Alertness: Awake/alert Behavior During Therapy: WFL for tasks assessed/performed Overall Cognitive Status: Within Functional Limits for tasks assessed                                 General  Comments: slow processing at times vs hard of hearing, good following of 1-step cues and increased time to process 2-step cues      Exercises      General Comments General comments (skin integrity, edema, etc.): HR 67 bpm resting and 80's bom during gait, SpO2 97% on RA; not dizzy, BP seated WNL (not recorded, see flowsheet)      Pertinent Vitals/Pain Pain Assessment:  No/denies pain Pain Intervention(s): Monitored during session;Repositioned    Home Living                      Prior Function            PT Goals (current goals can now be found in the care plan section) Acute Rehab PT Goals Patient Stated Goal: to get stronger and go home PT Goal Formulation: With patient Time For Goal Achievement: 05/03/20 Potential to Achieve Goals: Good Progress towards PT goals: Progressing toward goals    Frequency    Min 3X/week      PT Plan Current plan remains appropriate    Co-evaluation              AM-PAC PT "6 Clicks" Mobility   Outcome Measure  Help needed turning from your back to your side while in a flat bed without using bedrails?: A Little Help needed moving from lying on your back to sitting on the side of a flat bed without using bedrails?: A Little Help needed moving to and from a bed to a chair (including a wheelchair)?: A Little Help needed standing up from a chair using your arms (e.g., wheelchair or bedside chair)?: A Little Help needed to walk in hospital room?: A Lot Help needed climbing 3-5 steps with a railing? : Total 6 Click Score: 15    End of Session Equipment Utilized During Treatment: Gait belt Activity Tolerance: Patient tolerated treatment well Patient left: with call bell/phone within reach;in chair;with chair alarm set Nurse Communication: Mobility status PT Visit Diagnosis: Muscle weakness (generalized) (M62.81);Difficulty in walking, not elsewhere classified (R26.2)     Time: 8101-7510 PT Time Calculation (min) (ACUTE ONLY): 22 min  Charges:  $Gait Training: 8-22 mins                     Rue Valladares P., PTA Acute Rehabilitation Services Pager: [phone] Office: [phone]   Dorathy Kinsman Adina Puzzo 04/25/2020, 2:00 PM

## 2020-04-25 NOTE — Progress Notes (Signed)
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 medical history significant for CVA in 2015, MSSA bacteremia, essential hypertension, hyperlipidemia.   OT comments  Patient continues to work with acute rehab.  Has a tendency to ask for things to be done for him, instead of trying to complete them himself.  Patient found soaked in urine, had not called out for assist.  Patient with up to Min A for bed mobility, Min A to stand and Min Guard for peri care at side of the bed.  Patient stood with supervision while the pads were replaced.  Patient walked to the sink for grooming post peri care with [name].  Patient declined to sit in recliner due to discomfort, and returned to bed.  SNF is being considered, and is probable a better match post acute.    Follow Up Recommendations  SNF    Equipment Recommendations  Tub/shower seat;3 in 1 bedside commode    Recommendations for Other Services      Precautions / Restrictions Precautions Precautions: Fall Restrictions Weight Bearing Restrictions: No       Mobility Bed Mobility Overal bed mobility: Needs Assistance Bed Mobility: Supine to Sit;Sit to Supine     Supine to sit: Min assist Sit to supine: Min assist   General bed mobility comments: min guard with occasional cues, use of rail with UEs as able. Remained up in chair  Transfers Overall transfer level: Needs assistance Equipment used: Rolling walker (2 wheeled) Transfers: Sit to/from Stand Sit to Stand: Min assist         General transfer comment: from EOB needs three  attempts to reach upright at RW, also needed bed elevated; cues for safe hand placement needed and increased time needed for full trunk extension, minA for safety with balancing once upright; modA for initial transfer then minA to stand from chair>RW    Balance Overall balance assessment: Needs assistance Sitting-balance support: No upper extremity supported Sitting balance-Leahy Scale: Good     Standing balance support: Bilateral upper extremity supported Standing balance-Leahy Scale: Poor Standing balance comment: reliant on BUE support of RW for static/dynamic standing tasks, minor LOB during gait needing modA at times for safety/RW mgmt                           ADL either performed or assessed with clinical judgement   ADL       Grooming: Wash/dry hands;Wash/dry face;Set up;Sitting   Upper Body Bathing: Minimal assistance;Sitting       Upper Body Dressing : Minimal assistance;Sitting   Lower Body Dressing: Moderate assistance;Sit to/from stand               Functional mobility during ADLs: Rolling walker;Min guard                         Cognition Arousal/Alertness: Awake/alert Behavior During Therapy: WFL for tasks assessed/performed Overall Cognitive Status: Within Functional Limits for tasks assessed  General Comments: slow processing at times vs hard of hearing, good following of 1-step cues and increased time to process 2-step cues        Exercises Exercises:  (pt reports compliance with HEP handout in room)   Shoulder Instructions       General Comments     Pertinent Vitals/ Pain       Pain Assessment: No/denies pain Pain Intervention(s): Monitored during session  Home Living                                                        Frequency  Min 2X/week        Progress Toward Goals  OT Goals(current goals can now be found in the care plan section)   Progress towards OT goals: Progressing toward goals  Acute Rehab OT Goals Patient Stated Goal: to get stronger and go home OT Goal Formulation: With patient Time For Goal Achievement: 05/07/20 Potential to Achieve Goals: Good  Plan Discharge plan needs to be updated    Co-evaluation                 AM-PAC OT "6 Clicks" Daily Activity     Outcome Measure   Help from another person eating meals?: None Help from another person taking care of personal grooming?: A Little Help from another person toileting, which includes using toliet, bedpan, or urinal?: A Lot Help from another person bathing (including washing, rinsing, drying)?: A Lot Help from another person to put on and taking off regular upper body clothing?: A Little Help from another person to put on and taking off regular lower body clothing?: A Lot 6 Click Score: 16    End of Session Equipment Utilized During Treatment: Gait belt;Rolling walker  OT Visit Diagnosis: Unsteadiness on feet (R26.81)   Activity Tolerance Patient tolerated treatment well   Patient Left in bed;with call bell/phone within reach   Nurse Communication          Time: 8250-5397 OT Time Calculation (min): 17 min  Charges: OT General Charges $OT Visit: 1 Visit OT Treatments $Self Care/Home Management : 8-22 mins  04/25/2020  Rich, OTR/L  Acute Rehabilitation Services  Office:  [phone]    Suzanna Obey 04/25/2020, 4:26 PM

## 2020-04-25 NOTE — Progress Notes (Signed)
PROGRESS NOTE    Elijah [name]  PYK:[id number] DOB: 08-11-1933 DOA: 04/18/2020 PCP: Jaclyn Shaggy, MD   Brief Narrative:  Elijah [name] is a 87 y.o. male with medical history significant for CVA in 2015, MSSA bacteremia, essential hypertension, hyperlipidemia, type 2 diabetes who initially presented to [facility] from home on 04/17/2020 due to 4 days of melena, weakness, fatigue, chest pain, and shortness of breath.  Work-up revealed symptomatic blood loss anemia with initial hemoglobin of 5.7K and NSTEMI.  Admitted to the ICU initially, received 3u PRBC to maintain hemoglobin.  Patient evaluated by GI, ICU, cardiology at [facility]. Currently on Protonix drip, received octreotide and Rocephin after CT angio abdomen and pelvis showed cirrhotic liver morphology with large gastric varices. EGD done on 04/18/2020 which showed very large gastric varix without any active bleeding but with evidence of recent bleeding.  Small nonbleeding superficial varices were also noted.  He also had diagnostic paracentesis, no evidence of SBP. Seen by cardiology felt that his uptrending troponins and EKG abnormalities were consistent with NSTEMI in the setting of acute blood loss anemia.  Recommended echocardiogram as well as beta-blockade when able to tolerate; nitro for symptomatic chest pain. GI recommended evaluation for TIPS or BRTO given high risk for rebleeding from the morphology of the size of the gastric varix and to transfer to [facility] for IR guided TIPS.  1/30 - Echocardiogram without overt findings per IR will likely proceed with TIPS and CA-BRTO procedure later today pending schedule. 1/31 - Tolerated procedure well - patient will need EGD 6 weeks; IR f/up in 36months - 3.3L paracentesis 2/1 - CTA shows complete occlusion of gastric varices 2/2 - Advancing diet, ongoing ascites, will initiate and escalate diuretics (Lasix/spironolactone) as appropriate/holding propranolol given borderline low blood pressure 2/3-4  - Continue to increase Lasix and spironolactone, will add low-dose propranolol today given improvement in patient's blood pressure.  CIR declined by insurance but they are agreeable for transition to SNF which patient is agreeable for as well.  Will pursue discharge to SNF in the next 24 hours as patient continues to improve.  Assessment & Plan:  Newly diagnosed cirrhosis of the liver with large acutely bleeding gastric varices, resolved Concurrent ascites, improving GI and IR following: TIPS with BRTO 04/21/20 - tolerated well. Paracentesis 04/22/09 - 3.3 L, SAAG = >1 consistent with ascites due to portal hypertension CTA abdomen post procedure shows improvement in varices Increase spironolactone; continue Lasix and low-dose propranolol  Acute blood loss anemia secondary to upper GI bleed post EGD on 04/18/2020. Hemoglobin of 5.7 on presentation.  Status post 3u PRBC transfusion since admission EGD 04/18/2020 showed very large gastric varix without any active bleeding but with evidence of recent bleeding.  Small nonbleeding superficial varices were also noted.  Status post TIPS/BRTO (see above) Protonix/octreotide drips discontinued Hemoglobin stable, appropriately uptrending  NSTEMI in the setting of marked acute blood loss anemia resolved. Presented with hemoglobin of 5.7K, baseline hemoglobin 10. Troponin has peaked at greater than 3700 and downtrending appropriately. Remains asymptomatic, cardiology discussion for preoperative clearance, they concur with previous note by [facility]. Juliann Pares [facility], given echocardiogram  Acute onset ascites from worsening cirrhosis IR following, likely require multiple paracentesis for symptom control during hospital stay Continue Lasix, spironolactone Paracentesis 04/22/2020 successful for 3L without complication, continue to follow clinically -appears to be improving daily with diuretics  Type 2 diabetes controlled, well controlled Lab Results  Component  Value Date   HGBA1C 5.4 04/18/2020  Hold off  home oral antiglycemic meds Start insulin sliding scale and hypoglycemic protocol Consider de-escalation of home antiglycemic's in the setting of tightly controlled glucose  Isolated elevated AST Avoid hepatotoxic agents Hold off on statin for now  BPH Resume home Flomax Monitor urine output  Physical debility/Ambulatory dysfunction PT to assess Fall precautions.  DVT prophylaxis: SCDs; avoid chemical prophylaxis given above Code Status: DNR confirmed at admission Family Communication: None available  Status is: Inpatient  Dispo: The patient is from: Home              Anticipated d/c is to: SNF, CIR declined by insurance              Anticipated d/c date is: 24-48h              Patient currently IS medically stable for discharge  Consultants:   PCCM, IR, GI, cardiology  Procedures:   Upper endoscopy 04/18/2020  TIPS/BRTO pending clearance  Antimicrobials:  Ceftriaxone  Subjective: No acute issues or events overnight, patient denies nausea vomiting diarrhea constipation headache fevers or chills.  Feels his abdominal swelling is improving daily, reports increased urine output over the past 48 hours.  Objective: Vitals:   04/24/20 2000 04/25/20 0000 04/25/20 0400 04/25/20 0625  BP: (!) 110/94 103/62 105/61   Pulse: 72 65 67   Resp: 17 18 16 18   Temp: 98.1 F (36.7 C) 97.6 F (36.4 C) 97.9 F (36.6 C)   TempSrc: Oral Oral Oral   SpO2: 98% 95% 97%   Weight:    90.3 kg  Height:        Intake/Output Summary (Last 24 hours) at 04/25/2020 06/23/2020 Last data filed at 04/24/2020 1249 Gross per 24 hour  Intake 480 ml  Output -  Net 480 ml   Filed Weights   04/22/20 0647 04/23/20 0645 04/25/20 0625  Weight: 87.4 kg 89.8 kg 90.3 kg    Examination:  General:  Pleasantly resting in bed, No acute distress. HEENT:  Normocephalic atraumatic.  Sclerae nonicteric, noninjected.  Extraocular movements intact  bilaterally. Neck:  Without mass or deformity.  Trachea is midline. Lungs:  Clear to auscultate bilaterally without rhonchi, wheeze, or rales. Heart:  Regular rate and rhythm.  Without murmurs, rubs, or gallops. Abdomen:  Soft, nontender, moderately distended, nontympanic.  Without guarding or rebound. Extremities: Without cyanosis, clubbing, edema, or obvious deformity. Vascular:  Dorsalis pedis and posterior tibial pulses palpable bilaterally. Skin:  Warm and dry, no erythema, no ulcerations.   Data Reviewed: I have personally reviewed following labs and imaging studies  CBC: Recent Labs  Lab 04/18/20 1436 04/18/20 1951 04/21/20 0136 04/22/20 0114 04/23/20 0126 04/24/20 0130 04/25/20 0214  WBC 10.6*   < > 8.9 8.1 9.9 9.2 10.5  NEUTROABS 8.9*  --   --   --   --   --   --   HGB 7.2*   < > 8.4* 7.6* 8.2* 8.3* 8.8*  HCT 21.4*   < > 25.4* 24.6* 24.8* 24.6* 27.4*  MCV 97.3   < > 99.6 101.2* 98.4 97.2 99.3  PLT 131*   < > 100* 90* 95* 105* 130*   < > = values in this interval not displayed.   Basic Metabolic Panel: Recent Labs  Lab 04/21/20 0136 04/22/20 0114 04/23/20 0126 04/24/20 0130 04/25/20 0214  NA 141 137 134* 136 137  K 3.7 3.5 3.5 3.3* 3.7  CL 112* 109 107 108 104  CO2 19* 21* 20* 21* 24  GLUCOSE 163*  191* 202* 184* 177*  BUN 35* 24* 22 19 17   CREATININE 1.06 0.93 0.97 1.12 1.15  CALCIUM 7.9* 7.9* 7.7* 7.4* 7.6*   GFR: Estimated Creatinine Clearance: 57.1 mL/min (by C-G formula based on SCr of 1.15 mg/dL). Liver Function Tests: Recent Labs  Lab 04/22/20 0114 04/23/20 0126 04/24/20 0130 04/25/20 0214  AST 39 32 30 41  ALT 27 25 25 27   ALKPHOS 90 99 104 135*  BILITOT 1.0 0.7 0.8 0.9  PROT 5.5* 5.4* 5.3* 5.3*  ALBUMIN 2.4* 2.2* 2.0* 1.9*   No results for input(s): LIPASE, AMYLASE in the last 168 hours. No results for input(s): AMMONIA in the last 168 hours. Coagulation Profile: No results for input(s): INR, PROTIME in the last 168 hours. Cardiac  Enzymes: No results for input(s): CKTOTAL, CKMB, CKMBINDEX, TROPONINI in the last 168 hours. BNP (last 3 results) No results for input(s): PROBNP in the last 8760 hours. HbA1C: No results for input(s): HGBA1C in the last 72 hours. CBG: Recent Labs  Lab 04/18/20 1303  GLUCAP 136*   Lipid Profile: No results for input(s): CHOL, HDL, LDLCALC, TRIG, CHOLHDL, LDLDIRECT in the last 72 hours. Thyroid Function Tests: No results for input(s): TSH, T4TOTAL, FREET4, T3FREE, THYROIDAB in the last 72 hours. Anemia Panel: No results for input(s): VITAMINB12, FOLATE, FERRITIN, TIBC, IRON, RETICCTPCT in the last 72 hours. Sepsis Labs: Recent Labs  Lab 04/18/20 1951 04/19/20 0606  LATICACIDVEN 2.3* 1.4    Recent Results (from the past 240 hour(s))  SARS Coronavirus 2 by RT PCR (hospital order, performed in [facility] hospital lab) Nasopharyngeal Nasopharyngeal Swab     Status: None   Collection Time: 04/17/20  5:14 PM   Specimen: Nasopharyngeal Swab  Result Value Ref Range Status   SARS Coronavirus 2 NEGATIVE NEGATIVE Final    Comment: (NOTE) SARS-CoV-2 target nucleic acids are NOT DETECTED.  The SARS-CoV-2 RNA is generally detectable in upper and lower respiratory specimens during the acute phase of infection. The lowest concentration of SARS-CoV-2 viral copies this assay can detect is 250 copies / mL. A negative result does not preclude SARS-CoV-2 infection and should not be used as the sole basis for treatment or other patient management decisions.  A negative result may occur with improper specimen collection / handling, submission of specimen other than nasopharyngeal swab, presence of viral mutation(s) within the areas targeted by this assay, and inadequate number of viral copies (<250 copies / mL). A negative result must be combined with clinical observations, patient history, and epidemiological information.  Fact Sheet for Patients:    [facility]  Fact Sheet for Healthcare Providers: 04/19/20  This test is not yet approved or  cleared by the [employer] FDA and has been authorized for detection and/or diagnosis of SARS-CoV-2 by FDA under an Emergency Use Authorization (EUA).  This EUA will remain in effect (meaning this test can be used) for the duration of the COVID-19 declaration under Section 564(b)(1) of the Act, 21 U.S.C. section 360bbb-3(b)(1), unless the authorization is terminated or revoked sooner.  Performed at [facility], [address] [place], [address] [place]   Culture, blood (routine x 2)     Status: None   Collection Time: 04/17/20  6:27 PM   Specimen: BLOOD  Result Value Ref Range Status   Specimen Description BLOOD BLOOD LEFT FOREARM  Final   Special Requests   Final    BOTTLES DRAWN AEROBIC AND ANAEROBIC Blood Culture adequate volume   Culture   Final  NO GROWTH 5 DAYS Performed at [facility], [address] [place], [place] [address]    Report Status 04/22/2020 FINAL  Final  Culture, blood (routine x 2)     Status: None   Collection Time: 04/17/20  6:31 PM   Specimen: BLOOD  Result Value Ref Range Status   Specimen Description BLOOD BLOOD RIGHT WRIST  Final   Special Requests   Final    BOTTLES DRAWN AEROBIC AND ANAEROBIC Blood Culture results may not be optimal due to an inadequate volume of blood received in culture bottles   Culture   Final    NO GROWTH 5 DAYS Performed at [facility], [address], [place], [place] [address]    Report Status 04/22/2020 FINAL  Final  Aerobic/Anaerobic Culture (surgical/deep wound)     Status: None   Collection Time: 04/18/20  3:33 PM   Specimen: PATH Cytology Peritoneal fluid  Result Value Ref Range Status   Specimen Description   Final    PERITONEAL Performed at [facility], [address], [place], [place] [address]     Special Requests   Final    PERITONEAL Performed at [facility], [address] [place], [place] [address]    Gram Stain   Final    FEW WBC PRESENT, PREDOMINANTLY MONONUCLEAR NO ORGANISMS SEEN    Culture   Final    No growth aerobically or anaerobically. Performed at [facility], [address], [place], [place] [address]    Report Status 04/24/2020 FINAL  Final         Radiology Studies: No results found. Scheduled Meds: . furosemide  40 mg Oral BID  . pantoprazole  40 mg Oral BID  . propranolol  10 mg Oral BID  . spironolactone  25 mg Oral Daily  . tamsulosin  0.4 mg Oral QPC breakfast   Continuous Infusions: . cefTRIAXone (ROCEPHIN)  IV 2 g (04/24/20 2259)    LOS: 7 days   Time spent:  Azucena Fallen, DO [facility]  If 7PM-7AM, please contact night-coverage [email or web address]  04/25/2020, 7:22 AM

## 2020-04-25 NOTE — Discharge Summary (Incomplete)
Physician Discharge Summary  Elijah [name] RKY:[id number] DOB: [birth year] DOA: 04/18/2020  PCP: Jaclyn Shaggy, MD  Admit date: 04/18/2020 Discharge date: 04/25/2020  Admitted From: *** Disposition:  ***  Recommendations for Outpatient Follow-up:  1. Follow up with PCP in 1-2 weeks 2. Please obtain BMP/CBC in one week 3. Please follow up on the following pending results:  Home Health:***  Equipment/Devices:***  Discharge Condition:***  CODE STATUS:***  Diet recommendation:    Brief/Interim Summary: Elijah Fearing A Crawfordis a 86 y.o.malewith medical history significant forCVA in 2015, MSSA bacteremia, essential hypertension, hyperlipidemia, type 2 diabetes who initially [facility] fromhome on 1/27/2022due to 4 days of melena, weakness,fatigue,chest pain,and shortness of breath. Work-up revealed symptomatic blood loss anemia with initial hemoglobin of 5.7Kand NSTEMI. Admittedto the ICU initially, received 3u PRBC to maintain hemoglobin.  Patient evaluated by GI, ICU, cardiology at [facility].Currently on Protonix drip, received octreotide and Rocephin after CT angio abdomen and pelvis showed cirrhotic liver morphology with large gastric varices. EGDdone on 1/28/2022which showed very large gastric varix without any active bleeding but with evidence of recent bleeding. Small nonbleeding superficial varices were also noted. He also had diagnostic paracentesis, no evidence of SBP. Seen by cardiology felt that his uptrending troponins and EKG abnormalities were consistent with NSTEMI in the setting of acute blood loss anemia. Recommended echocardiogram as well as beta-blockade when able to tolerate; nitro for symptomatic chest pain. GI recommended evaluation for TIPS or BRTO given high risk for rebleeding from the morphology of the size of the gastric varixand to transfer to [facility] for IR guided TIPS.  1/30 Echocardiogram without overt findings per IR will likely proceed with TIPS  and CA-BRTO procedure later today pending schedule. 1/31 Tolerated procedure well - patient will need EGD 6 weeks; IR f/up in 3months - 3.3L paracentesis 2/1 CTA shows complete occlusion of gastric varices 2/2 advancing diet, ongoing ascites, will initiate and escalate diuretics(Lasix/spironolactone) as appropriate/holding propranolol given borderline low blood pressure 2/3 continue to increase Lasix and spironolactone, will add low-dose propranolol today given improvement in patient's blood pressure  ***increase meds again? BP/labs stable - Follow [facility]***  Discharge Diagnoses:  Active Problems:   GIB (gastrointestinal bleeding)    Discharge Instructions   Allergies as of 04/25/2020   No Known Allergies   Med Rec must be completed prior to using this [facility]***       Follow-up Information    [facility] Follow up.   Why: 3 month follow up appointment needed. A scheduler from our office will call you to arrange a date/time.  Contact information: [address] [place] [address] [phone]              No Known Allergies  Consultations:  ***Specify Physician/Group   Procedures/Studies: DG Chest 2 View  Result Date: 04/17/2020 CLINICAL DATA:  Patient with weakness. EXAM: CHEST - 2 VIEW COMPARISON:  Chest radiograph 09/11/2013. FINDINGS: Stable cardiomegaly. Large layering left pleural effusion with underlying consolidation. No pneumothorax. Thoracic spine degenerative changes. IMPRESSION: Large layering left pleural effusion with underlying consolidation. Electronically Signed   By: Annia Belt M.D.   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 He presents today for  balloon occluded transvenous obliteration of gastric varices. EXAM:  1. Ultrasound-guided access right internal jugular vein 2. Catheterization and venogram, left renal vein 3. Catheterization and venogram of gastro renal shunt 4. Catheterization and venogram of left inferior phrenic vein 5. Coil embolization of left inferior phrenic vein 6. Catheterization of venogram of accessory left inferior phrenic vein 7. Coil embolization of accessory left inferior phrenic vein 8. Embolization of gastric varices and gastro renal shunt MEDICATIONS: None. ANESTHESIA/SEDATION: 2 mg Versed and 100 mcg fentanyl were administered intravenously. Moderate Sedation Time:  64 minutes The patient was continuously monitored during the procedure by the interventional radiology nurse under my direct supervision. CONTRAST:  120 mL Omnipaque 350 FLUOROSCOPY TIME:  Fluoroscopy Time: 40 minutes 30 seconds (1472 mGy). COMPLICATIONS: None immediate. PROCEDURE: Informed written consent was obtained from the patient after a thorough discussion of the procedural risks, benefits and alternatives. All questions were addressed. Maximal Sterile Barrier Technique was utilized including caps, mask, sterile gowns, sterile gloves, sterile drape, hand hygiene and skin antiseptic. A timeout was performed prior to the initiation of the procedure. The right internal jugular vein was interrogated with ultrasound and found to be widely patent. An image was obtained and stored for the medical record. Local anesthesia was attained by infiltration with 1% lidocaine. A small dermatotomy was made. Under real-time sonographic guidance, the vessel was punctured with a 21 gauge micropuncture needle. Using standard technique, the initial micro needle was exchanged over a 0.018 micro wire for a transitional 4 [place] micro sheath. The micro sheath was then exchanged over a 0.035 wire for a 10 French fascial dilator and the soft tissue tract was dilated. Next, a 10 French  tips sheath was carefully advanced over the wire and into the inferior vena cava. An angled 5 French catheter was then advanced over the wire. The catheter was used to select the left renal vein. A left renal venogram was performed. Standard venous anatomy. No evidence of left renal venous thrombus. A rose in wire was parked in an inferior branch of the left renal vein. The tips sheath was then advanced into the origin of the renal vein. Additional left renal venography was performed. The inflow of the gastro renal shunt was successfully identified along the superior margin of the left renal vein. The angled catheter was introduced through the tips sheath coaxially adjacent to the stabilizing Rosen wire. Utilizing a Glidewire, the catheter was successfully advanced into proximal left gastro renal shunt. Venography was again performed. The renal shunt is robust. The expected web near the origin was identified. Flow direction is toward the renal vein. The Glidewire and catheter were advanced deeper into the gastro renal shunt. The glidewire was exchanged for a superstiff Amplatz wire. The 5 French catheter was removed. Next, attempts were made to advance several occlusion balloons into the gastro renal shunt. Ultimately, a 9 French Merci balloon occlusion catheter was successfully advanced into the gastro renal shunt. The occlusion balloon was inflated and pulled snug against the web near the gastro renal shunt origin. Balloon occluded venography was then performed. The gastro renal shunt is quite massive. At least 2 large draining veins are identified escaping the gastro renal shunt and draining into the systemic system. These appear to represent the inferior phrenic vein and a second accessory inferior phrenic vein. Coil embolization of these non bleeding vessels will likely be required in order to obtain stasis within the recently hemorrhaged gastric varices. A penumbra lantern microcatheter was advanced coaxially  through the balloon occlusion catheter over a 0.016 fathom wire. The  catheter was successfully used to select the inferior phrenic vein. The catheter was advanced into the inferior phrenic vein and venography was performed confirming catheter location. The inferior phrenic vein does drain into the systemic symptom and provide an outlet from the gastro renal shunt. Coil embolization was then performed using a series of 3 and 4 mm soft detachable Ruby coils. The microcatheter was brought back into the main body of the gastro renal shunt. The catheter was carefully advanced deeper into the gastro renal shunt and then used to successfully catheterize the next major escape vessel. The catheter was advanced into the vessel and venography performed. This appears to be an accessory inferior phrenic vein which drains into the azygos and hemi azygous system. This vessel must be sacrificed in order to proceed with obliteration of the target gastric varices. Therefore, coil embolization was performed utilizing a combination of 3, 4 and 5 mm Ruby detachable coils. The microcatheter was then brought back into the gastro renal shunt. Additional venography was performed, this time with carbon dioxide gas. The carbon dioxide gas successfully opacifies the entirety of the gastric variceal network. The afferent vein from the portal venous system appears to be the posterior gastric vein. Sclerosis and mixture was then prepared in the standard fashion (3: 2 : 1 mixture of air : 3% sodium tetradecyl Sulfate : Lipiodol). The sclerosis mixture was then instilled in aliquots through the lantern microcatheter filling the gastro renal shunt and ultimately the gastric varices. Injection was stopped when sclerosis and began to spill into the posterior gastric vein. Approximately 10 mL 3% sodium tetradecyl Sulfate and 4 mL Lipiodol were administered. The microcatheter was then brought back into the proximal gastro renal shunt just beyond the  occlusion balloon. Coil embolization of the proximal gastro renal shunt was then performed utilizing numerous 18, 16, 14 mm detachable Ruby microcoils as well as multiple liquid metal packing coils. Once a dense metal plug was successfully created, the occlusion balloon was slowly deflated under real-time fluoroscopic imaging. The sclerosing mass and coil pack were stable. No evidence of mobilization or flow. The microcatheter was removed. The occlusion balloon was brought back into the left renal vein. Left renal venography was performed confirming persistent patency of the renal vein. No evidence of complication. The sheath and occlusion balloon were removed. Hemostasis was attained with the assistance of manual pressure. IMPRESSION: 1. Successful coil embolization of non bleeding accessory portosystemic collaterals (inferior phrenic an accessory inferior phrenic veins) which was required to successfully treated the bleeding gastric varices. 2. Successful coil assisted balloon occluded transvenous obliteration (CA-BRTO) of gastric varices using liquid sclerosant and detachable coils. PLAN: 1. Maintain bed rest overnight. Clear liquids are okay. Advanced diet as tolerated beginning tomorrow if patient remains stable. 2. BRTO protocol CT scan of the abdomen on Tuesday. 3. Patient will require outpatient follow-up EGD in approximately 6 weeks to assess for new or progressive esophageal varices and to assess the treated gastric varices. 4. Follow-up with Interventional Radiology in 3 months. Signed, Sterling Big, MD, RPVI [facility] Radiology Electronically Signed   By: Malachy Moan M.D.   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 He presents today for balloon occluded transvenous obliteration of gastric varices. EXAM:  1. Ultrasound-guided access right internal jugular vein 2. Catheterization and venogram, left renal vein 3. Catheterization and venogram of gastro renal shunt 4. Catheterization and venogram of left inferior phrenic vein 5. Coil embolization of left inferior phrenic vein 6. Catheterization of venogram of accessory left inferior phrenic vein 7. Coil embolization of accessory left inferior phrenic vein 8. Embolization of gastric varices and gastro renal shunt MEDICATIONS: None. ANESTHESIA/SEDATION: 2 mg Versed and 100 mcg fentanyl were administered intravenously. Moderate Sedation Time:  64 minutes The patient was continuously monitored during the procedure by the interventional radiology nurse under my direct supervision. CONTRAST:  120 mL Omnipaque 350 FLUOROSCOPY TIME:  Fluoroscopy Time: 40 minutes 30 seconds (1472 mGy). COMPLICATIONS: None immediate. PROCEDURE: Informed written consent was obtained from the patient after a thorough discussion of the procedural risks, benefits and alternatives. All questions were addressed. Maximal Sterile Barrier Technique was utilized including caps, mask, sterile gowns, sterile gloves, sterile drape, hand hygiene and skin antiseptic. A timeout was performed prior to the initiation of the procedure. The right internal jugular vein was interrogated with ultrasound and found to be widely patent. An image was obtained and stored for the medical record. Local anesthesia was attained by infiltration with 1% lidocaine. A small dermatotomy was made. Under real-time sonographic guidance, the vessel was punctured with a 21 gauge micropuncture needle. Using standard technique, the initial micro needle was exchanged over a 0.018 micro wire for a transitional 4 [place] micro sheath. The micro sheath was then exchanged over a 0.035 wire for a 10 French  fascial dilator and the soft tissue tract was dilated. Next, a 10 French tips sheath was carefully advanced over the wire and into the inferior vena cava. An angled 5 French catheter was then advanced over the wire. The catheter was used to select the left renal vein. A left renal venogram was performed. Standard venous anatomy. No evidence of left renal venous thrombus. A rose in wire was parked in an inferior branch of the left renal vein. The tips sheath was then advanced into the origin of the renal vein. Additional left renal venography was performed. The inflow of the gastro renal shunt was successfully identified along the superior margin of the left renal vein. The angled catheter was introduced through the tips sheath coaxially adjacent to the stabilizing Rosen wire. Utilizing a Glidewire, the catheter was successfully advanced into proximal left gastro renal shunt. Venography was again performed. The renal shunt is robust. The expected web near the origin was identified. Flow direction is toward the renal vein. The Glidewire and catheter were advanced deeper into the gastro renal shunt. The glidewire was exchanged for a superstiff Amplatz wire. The 5 French catheter was removed. Next, attempts were made to advance several occlusion balloons into the gastro renal shunt. Ultimately, a 66 French Merci balloon occlusion catheter was successfully advanced into the gastro renal shunt. The occlusion balloon was inflated and pulled snug against the web near the gastro renal shunt origin. Balloon occluded venography was then performed. The gastro renal shunt is quite massive. At least 2 large draining veins are identified escaping the gastro renal shunt and draining into the systemic system. These appear to represent the inferior phrenic vein and a second accessory inferior phrenic vein. Coil embolization of these non bleeding vessels will likely be required in order to obtain stasis within the recently hemorrhaged  gastric varices. A penumbra lantern microcatheter was advanced coaxially through the balloon occlusion catheter over a 0.016 fathom wire. The  catheter was successfully used to select the inferior phrenic vein. The catheter was advanced into the inferior phrenic vein and venography was performed confirming catheter location. The inferior phrenic vein does drain into the systemic symptom and provide an outlet from the gastro renal shunt. Coil embolization was then performed using a series of 3 and 4 mm soft detachable Ruby coils. The microcatheter was brought back into the main body of the gastro renal shunt. The catheter was carefully advanced deeper into the gastro renal shunt and then used to successfully catheterize the next major escape vessel. The catheter was advanced into the vessel and venography performed. This appears to be an accessory inferior phrenic vein which drains into the azygos and hemi azygous system. This vessel must be sacrificed in order to proceed with obliteration of the target gastric varices. Therefore, coil embolization was performed utilizing a combination of 3, 4 and 5 mm Ruby detachable coils. The microcatheter was then brought back into the gastro renal shunt. Additional venography was performed, this time with carbon dioxide gas. The carbon dioxide gas successfully opacifies the entirety of the gastric variceal network. The afferent vein from the portal venous system appears to be the posterior gastric vein. Sclerosis and mixture was then prepared in the standard fashion (3: 2 : 1 mixture of air : 3% sodium tetradecyl Sulfate : Lipiodol). The sclerosis mixture was then instilled in aliquots through the lantern microcatheter filling the gastro renal shunt and ultimately the gastric varices. Injection was stopped when sclerosis and began to spill into the posterior gastric vein. Approximately 10 mL 3% sodium tetradecyl Sulfate and 4 mL Lipiodol were administered. The microcatheter was  then brought back into the proximal gastro renal shunt just beyond the occlusion balloon. Coil embolization of the proximal gastro renal shunt was then performed utilizing numerous 18, 16, 14 mm detachable Ruby microcoils as well as multiple liquid metal packing coils. Once a dense metal plug was successfully created, the occlusion balloon was slowly deflated under real-time fluoroscopic imaging. The sclerosing mass and coil pack were stable. No evidence of mobilization or flow. The microcatheter was removed. The occlusion balloon was brought back into the left renal vein. Left renal venography was performed confirming persistent patency of the renal vein. No evidence of complication. The sheath and occlusion balloon were removed. Hemostasis was attained with the assistance of manual pressure. IMPRESSION: 1. Successful coil embolization of non bleeding accessory portosystemic collaterals (inferior phrenic an accessory inferior phrenic veins) which was required to successfully treated the bleeding gastric varices. 2. Successful coil assisted balloon occluded transvenous obliteration (CA-BRTO) of gastric varices using liquid sclerosant and detachable coils. PLAN: 1. Maintain bed rest overnight. Clear liquids are okay. Advanced diet as tolerated beginning tomorrow if patient remains stable. 2. BRTO protocol CT scan of the abdomen on Tuesday. 3. Patient will require outpatient follow-up EGD in approximately 6 weeks to assess for new or progressive esophageal varices and to assess the treated gastric varices. 4. Follow-up with Interventional Radiology in 3 months. Signed, Sterling Big, MD, RPVI [facility] Radiology Electronically Signed   By: Malachy Moan M.D.   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 He presents today for balloon occluded transvenous obliteration of gastric varices. EXAM:  1. Ultrasound-guided access right internal jugular vein 2. Catheterization and venogram, left renal vein 3. Catheterization and venogram of gastro renal shunt 4. Catheterization and venogram of left inferior phrenic vein 5. Coil embolization of left inferior phrenic vein 6. Catheterization of venogram of accessory left inferior phrenic vein 7. Coil embolization of accessory left inferior phrenic vein 8. Embolization of gastric varices and gastro renal shunt MEDICATIONS: None. ANESTHESIA/SEDATION: 2 mg Versed and 100 mcg fentanyl were administered intravenously. Moderate Sedation Time:  64 minutes The patient was continuously monitored during the procedure by the interventional radiology nurse under my direct supervision. CONTRAST:  120 mL Omnipaque 350 FLUOROSCOPY TIME:  Fluoroscopy Time: 40 minutes 30 seconds (1472 mGy). COMPLICATIONS: None immediate. PROCEDURE: Informed written consent was obtained from the patient after a thorough discussion of the procedural risks, benefits and alternatives. All questions were addressed. Maximal Sterile Barrier Technique was utilized including caps, mask, sterile gowns, sterile gloves, sterile drape, hand hygiene and skin antiseptic. A timeout was performed prior to the initiation of the procedure. The right internal jugular vein was interrogated with ultrasound and found to be widely patent. An image was obtained and stored for the medical record. Local anesthesia was attained by infiltration with 1% lidocaine. A small dermatotomy was made. Under real-time sonographic guidance, the vessel was punctured with a 21 gauge micropuncture needle. Using standard technique, the initial micro needle was exchanged over a 0.018 micro wire for a transitional 4 [place] micro sheath.  The micro sheath was then exchanged over a 0.035 wire for a 10 French fascial dilator and the soft tissue tract was dilated. Next, a 10 French tips sheath was carefully advanced over the wire and into the inferior vena cava. An angled 5 French catheter was then advanced over the wire. The catheter was used to select the left renal vein. A left renal venogram was performed. Standard venous anatomy. No evidence of left renal venous thrombus. A rose in wire was parked in an inferior branch of the left renal vein. The tips sheath was then advanced into the origin of the renal vein. Additional left renal venography was performed. The inflow of the gastro renal shunt was successfully identified along the superior margin of the left renal vein. The angled catheter was introduced through the tips sheath coaxially adjacent to the stabilizing Rosen wire. Utilizing a Glidewire, the catheter was successfully advanced into proximal left gastro renal shunt. Venography was again performed. The renal shunt is robust. The expected web near the origin was identified. Flow direction is toward the renal vein. The Glidewire and catheter were advanced deeper into the gastro renal shunt. The glidewire was exchanged for a superstiff Amplatz wire. The 5 French catheter was removed. Next, attempts were made to advance several occlusion balloons into the gastro renal shunt. Ultimately, a 31 French Merci balloon occlusion catheter was successfully advanced into the gastro renal shunt. The occlusion balloon was inflated and pulled snug against the web near the gastro renal shunt origin. Balloon occluded venography was then performed. The gastro renal shunt is quite massive. At least 2 large draining veins are identified escaping the gastro renal shunt and draining into the systemic system. These appear to represent the inferior phrenic vein and a second accessory inferior phrenic vein. Coil embolization of these non bleeding vessels will likely  be required in order to obtain stasis within the recently hemorrhaged gastric varices. A penumbra lantern microcatheter was advanced coaxially through the balloon occlusion catheter over a 0.016 fathom wire. The  catheter was successfully used to select the inferior phrenic vein. The catheter was advanced into the inferior phrenic vein and venography was performed confirming catheter location. The inferior phrenic vein does drain into the systemic symptom and provide an outlet from the gastro renal shunt. Coil embolization was then performed using a series of 3 and 4 mm soft detachable Ruby coils. The microcatheter was brought back into the main body of the gastro renal shunt. The catheter was carefully advanced deeper into the gastro renal shunt and then used to successfully catheterize the next major escape vessel. The catheter was advanced into the vessel and venography performed. This appears to be an accessory inferior phrenic vein which drains into the azygos and hemi azygous system. This vessel must be sacrificed in order to proceed with obliteration of the target gastric varices. Therefore, coil embolization was performed utilizing a combination of 3, 4 and 5 mm Ruby detachable coils. The microcatheter was then brought back into the gastro renal shunt. Additional venography was performed, this time with carbon dioxide gas. The carbon dioxide gas successfully opacifies the entirety of the gastric variceal network. The afferent vein from the portal venous system appears to be the posterior gastric vein. Sclerosis and mixture was then prepared in the standard fashion (3: 2 : 1 mixture of air : 3% sodium tetradecyl Sulfate : Lipiodol). The sclerosis mixture was then instilled in aliquots through the lantern microcatheter filling the gastro renal shunt and ultimately the gastric varices. Injection was stopped when sclerosis and began to spill into the posterior gastric vein. Approximately 10 mL 3% sodium  tetradecyl Sulfate and 4 mL Lipiodol were administered. The microcatheter was then brought back into the proximal gastro renal shunt just beyond the occlusion balloon. Coil embolization of the proximal gastro renal shunt was then performed utilizing numerous 18, 16, 14 mm detachable Ruby microcoils as well as multiple liquid metal packing coils. Once a dense metal plug was successfully created, the occlusion balloon was slowly deflated under real-time fluoroscopic imaging. The sclerosing mass and coil pack were stable. No evidence of mobilization or flow. The microcatheter was removed. The occlusion balloon was brought back into the left renal vein. Left renal venography was performed confirming persistent patency of the renal vein. No evidence of complication. The sheath and occlusion balloon were removed. Hemostasis was attained with the assistance of manual pressure. IMPRESSION: 1. Successful coil embolization of non bleeding accessory portosystemic collaterals (inferior phrenic an accessory inferior phrenic veins) which was required to successfully treated the bleeding gastric varices. 2. Successful coil assisted balloon occluded transvenous obliteration (CA-BRTO) of gastric varices using liquid sclerosant and detachable coils. PLAN: 1. Maintain bed rest overnight. Clear liquids are okay. Advanced diet as tolerated beginning tomorrow if patient remains stable. 2. BRTO protocol CT scan of the abdomen on Tuesday. 3. Patient will require outpatient follow-up EGD in approximately 6 weeks to assess for new or progressive esophageal varices and to assess the treated gastric varices. 4. Follow-up with Interventional Radiology in 3 months. Signed, Sterling Big, MD, RPVI [facility] Radiology Electronically Signed   By: Malachy Moan M.D.   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 He presents today for balloon occluded transvenous obliteration of gastric varices. EXAM: 1.  Ultrasound-guided access right internal jugular vein 2. Catheterization and venogram, left renal vein 3. Catheterization and venogram of gastro renal shunt 4. Catheterization and venogram of left inferior phrenic vein 5. Coil embolization of left inferior phrenic vein 6. Catheterization of venogram of accessory left inferior phrenic vein 7. Coil embolization of accessory left inferior phrenic vein 8. Embolization of gastric varices and gastro renal shunt MEDICATIONS: None. ANESTHESIA/SEDATION: 2 mg Versed and 100 mcg fentanyl were administered intravenously. Moderate Sedation Time:  64 minutes The patient was continuously monitored during the procedure by the interventional radiology nurse under my direct supervision. CONTRAST:  120 mL Omnipaque 350 FLUOROSCOPY TIME:  Fluoroscopy Time: 40 minutes 30 seconds (1472 mGy). COMPLICATIONS: None immediate. PROCEDURE: Informed written consent was obtained from the patient after a thorough discussion of the procedural risks, benefits and alternatives. All questions were addressed. Maximal Sterile Barrier Technique was utilized including caps, mask, sterile gowns, sterile gloves, sterile drape, hand hygiene and skin antiseptic. A timeout was performed prior to the initiation of the procedure. The right internal jugular vein was interrogated with ultrasound and found to be widely patent. An image was obtained and stored for the medical record. Local anesthesia was attained by infiltration with 1% lidocaine. A small dermatotomy was made. Under real-time sonographic guidance, the vessel was punctured with a 21 gauge micropuncture needle. Using standard technique, the initial micro needle was exchanged over  a 0.018 micro wire for a transitional 4 [place] micro sheath. The micro sheath was then exchanged over a 0.035 wire for a 10 French fascial dilator and the soft tissue tract was dilated. Next, a 10 French tips sheath was carefully advanced over the wire and into the inferior vena cava. An angled 5 French catheter was then advanced over the wire. The catheter was used to select the left renal vein. A left renal venogram was performed. Standard venous anatomy. No evidence of left renal venous thrombus. A rose in wire was parked in an inferior branch of the left renal vein. The tips sheath was then advanced into the origin of the renal vein. Additional left renal venography was performed. The inflow of the gastro renal shunt was successfully identified along the superior margin of the left renal vein. The angled catheter was introduced through the tips sheath coaxially adjacent to the stabilizing Rosen wire. Utilizing a Glidewire, the catheter was successfully advanced into proximal left gastro renal shunt. Venography was again performed. The renal shunt is robust. The expected web near the origin was identified. Flow direction is toward the renal vein. The Glidewire and catheter were advanced deeper into the gastro renal shunt. The glidewire was exchanged for a superstiff Amplatz wire. The 5 French catheter was removed. Next, attempts were made to advance several occlusion balloons into the gastro renal shunt. Ultimately, a 56 French Merci balloon occlusion catheter was successfully advanced into the gastro renal shunt. The occlusion balloon was inflated and pulled snug against the web near the gastro renal shunt origin. Balloon occluded venography was then performed. The gastro renal shunt is quite massive. At least 2 large draining veins are identified escaping the gastro renal shunt and draining into the systemic system. These appear to represent the inferior phrenic vein and a second accessory inferior phrenic vein.  Coil embolization of these non bleeding vessels will likely be required in order to obtain stasis within the recently hemorrhaged gastric varices. A penumbra lantern microcatheter was advanced coaxially through the balloon occlusion catheter over a 0.016 fathom wire. The catheter  was successfully used to select the inferior phrenic vein. The catheter was advanced into the inferior phrenic vein and venography was performed confirming catheter location. The inferior phrenic vein does drain into the systemic symptom and provide an outlet from the gastro renal shunt. Coil embolization was then performed using a series of 3 and 4 mm soft detachable Ruby coils. The microcatheter was brought back into the main body of the gastro renal shunt. The catheter was carefully advanced deeper into the gastro renal shunt and then used to successfully catheterize the next major escape vessel. The catheter was advanced into the vessel and venography performed. This appears to be an accessory inferior phrenic vein which drains into the azygos and hemi azygous system. This vessel must be sacrificed in order to proceed with obliteration of the target gastric varices. Therefore, coil embolization was performed utilizing a combination of 3, 4 and 5 mm Ruby detachable coils. The microcatheter was then brought back into the gastro renal shunt. Additional venography was performed, this time with carbon dioxide gas. The carbon dioxide gas successfully opacifies the entirety of the gastric variceal network. The afferent vein from the portal venous system appears to be the posterior gastric vein. Sclerosis and mixture was then prepared in the standard fashion (3: 2 : 1 mixture of air : 3% sodium tetradecyl Sulfate : Lipiodol). The sclerosis mixture was then instilled in aliquots through the lantern microcatheter filling the gastro renal shunt and ultimately the gastric varices. Injection was stopped when sclerosis and began to spill into the  posterior gastric vein. Approximately 10 mL 3% sodium tetradecyl Sulfate and 4 mL Lipiodol were administered. The microcatheter was then brought back into the proximal gastro renal shunt just beyond the occlusion balloon. Coil embolization of the proximal gastro renal shunt was then performed utilizing numerous 18, 16, 14 mm detachable Ruby microcoils as well as multiple liquid metal packing coils. Once a dense metal plug was successfully created, the occlusion balloon was slowly deflated under real-time fluoroscopic imaging. The sclerosing mass and coil pack were stable. No evidence of mobilization or flow. The microcatheter was removed. The occlusion balloon was brought back into the left renal vein. Left renal venography was performed confirming persistent patency of the renal vein. No evidence of complication. The sheath and occlusion balloon were removed. Hemostasis was attained with the assistance of manual pressure. IMPRESSION: 1. Successful coil embolization of non bleeding accessory portosystemic collaterals (inferior phrenic an accessory inferior phrenic veins) which was required to successfully treated the bleeding gastric varices. 2. Successful coil assisted balloon occluded transvenous obliteration (CA-BRTO) of gastric varices using liquid sclerosant and detachable coils. PLAN: 1. Maintain bed rest overnight. Clear liquids are okay. Advanced diet as tolerated beginning tomorrow if patient remains stable. 2. BRTO protocol CT scan of the abdomen on Tuesday. 3. Patient will require outpatient follow-up EGD in approximately 6 weeks to assess for new or progressive esophageal varices and to assess the treated gastric varices. 4. Follow-up with Interventional Radiology in 3 months. Signed, Sterling Big, MD, RPVI [facility]ascular and Interventional Radiology Specialists [facility] Radiology Electronically Signed   By: Malachy Moan M.D.   On: 04/20/2020 15:16   US Paracentesis  Result Date:  04/18/2020 INDICATION: Patient with newly found cirrhosis, ascites. Request is made for diagnostic paracentesis. EXAM: ULTRASOUND GUIDED DIAGNOSTIC PARACENTESIS MEDICATIONS: 10 mL 1% lidocaine COMPLICATIONS: None immediate. PROCEDURE: Informed written consent was obtained from the patient after a discussion of the risks, benefits and alternatives to treatment. A timeout was  performed prior to the initiation of the procedure. Initial ultrasound scanning demonstrates a small amount of ascites within the right lower abdominal quadrant. The right lower abdomen was prepped and draped in the usual sterile fashion. 1% lidocaine was used for local anesthesia. Following this, a 19 gauge, 7-cm, Yueh catheter was introduced. An ultrasound image was saved for documentation purposes. The paracentesis was performed. The catheter was removed and a dressing was applied. The patient tolerated the procedure well without immediate post procedural complication. FINDINGS: A total of approximately 60 mL of pale, clear fluid was removed. Samples were sent to the laboratory as requested by the clinical team. IMPRESSION: Successful ultrasound-guided paracentesis yielding 60 mL of peritoneal fluid. Read by: Loyce Dys PA-C Electronically Signed   By: Acquanetta Belling M.D.   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 He presents today for balloon occluded transvenous obliteration of gastric varices. EXAM: 1. Ultrasound-guided access right internal jugular vein 2. Catheterization and venogram, left renal vein 3. Catheterization and venogram of gastro renal shunt 4. Catheterization and venogram of left inferior phrenic vein 5. Coil embolization of left inferior phrenic vein 6.  Catheterization of venogram of accessory left inferior phrenic vein 7. Coil embolization of accessory left inferior phrenic vein 8. Embolization of gastric varices and gastro renal shunt MEDICATIONS: None. ANESTHESIA/SEDATION: 2 mg Versed and 100 mcg fentanyl were administered intravenously. Moderate Sedation Time:  64 minutes The patient was continuously monitored during the procedure by the interventional radiology nurse under my direct supervision. CONTRAST:  120 mL Omnipaque 350 FLUOROSCOPY TIME:  Fluoroscopy Time: 40 minutes 30 seconds (1472 mGy). COMPLICATIONS: None immediate. PROCEDURE: Informed written consent was obtained from the patient after a thorough discussion of the procedural risks, benefits and alternatives. All questions were addressed. Maximal Sterile Barrier Technique was utilized including caps, mask, sterile gowns, sterile gloves, sterile drape, hand hygiene and skin antiseptic. A timeout was performed prior to the initiation of the procedure. The right internal jugular vein was interrogated with ultrasound and found to be widely patent. An image was obtained and stored for the medical record. Local anesthesia was attained by infiltration with 1% lidocaine. A small dermatotomy was made. Under real-time sonographic guidance, the vessel was punctured with a 21 gauge micropuncture needle. Using standard technique, the initial micro needle was exchanged over a 0.018 micro wire for a transitional 4 [place] micro sheath. The micro sheath was then exchanged over a 0.035 wire for a 10 French fascial dilator and the soft tissue tract was dilated. Next, a 10 French tips sheath was carefully advanced over the wire and into the inferior vena cava. An angled 5 French catheter was then advanced over the wire. The catheter was used to select the left renal vein. A left renal venogram was performed. Standard venous anatomy. No evidence of left renal venous thrombus. A rose in wire was parked in an inferior  branch of the left renal vein. The tips sheath was then advanced into the origin of the renal vein. Additional left renal venography was performed. The inflow of the gastro renal shunt was successfully identified along the superior margin of the left renal vein. The angled catheter was introduced through the tips sheath coaxially adjacent to the stabilizing Rosen wire. Utilizing a Glidewire, the  catheter was successfully advanced into proximal left gastro renal shunt. Venography was again performed. The renal shunt is robust. The expected web near the origin was identified. Flow direction is toward the renal vein. The Glidewire and catheter were advanced deeper into the gastro renal shunt. The glidewire was exchanged for a superstiff Amplatz wire. The 5 French catheter was removed. Next, attempts were made to advance several occlusion balloons into the gastro renal shunt. Ultimately, a 58 French Merci balloon occlusion catheter was successfully advanced into the gastro renal shunt. The occlusion balloon was inflated and pulled snug against the web near the gastro renal shunt origin. Balloon occluded venography was then performed. The gastro renal shunt is quite massive. At least 2 large draining veins are identified escaping the gastro renal shunt and draining into the systemic system. These appear to represent the inferior phrenic vein and a second accessory inferior phrenic vein. Coil embolization of these non bleeding vessels will likely be required in order to obtain stasis within the recently hemorrhaged gastric varices. A penumbra lantern microcatheter was advanced coaxially through the balloon occlusion catheter over a 0.016 fathom wire. The catheter was successfully used to select the inferior phrenic vein. The catheter was advanced into the inferior phrenic vein and venography was performed confirming catheter location. The inferior phrenic vein does drain into the systemic symptom and provide an outlet from  the gastro renal shunt. Coil embolization was then performed using a series of 3 and 4 mm soft detachable Ruby coils. The microcatheter was brought back into the main body of the gastro renal shunt. The catheter was carefully advanced deeper into the gastro renal shunt and then used to successfully catheterize the next major escape vessel. The catheter was advanced into the vessel and venography performed. This appears to be an accessory inferior phrenic vein which drains into the azygos and hemi azygous system. This vessel must be sacrificed in order to proceed with obliteration of the target gastric varices. Therefore, coil embolization was performed utilizing a combination of 3, 4 and 5 mm Ruby detachable coils. The microcatheter was then brought back into the gastro renal shunt. Additional venography was performed, this time with carbon dioxide gas. The carbon dioxide gas successfully opacifies the entirety of the gastric variceal network. The afferent vein from the portal venous system appears to be the posterior gastric vein. Sclerosis and mixture was then prepared in the standard fashion (3: 2 : 1 mixture of air : 3% sodium tetradecyl Sulfate : Lipiodol). The sclerosis mixture was then instilled in aliquots through the lantern microcatheter filling the gastro renal shunt and ultimately the gastric varices. Injection was stopped when sclerosis and began to spill into the posterior gastric vein. Approximately 10 mL 3% sodium tetradecyl Sulfate and 4 mL Lipiodol were administered. The microcatheter was then brought back into the proximal gastro renal shunt just beyond the occlusion balloon. Coil embolization of the proximal gastro renal shunt was then performed utilizing numerous 18, 16, 14 mm detachable Ruby microcoils as well as multiple liquid metal packing coils. Once a dense metal plug was successfully created, the occlusion balloon was slowly deflated under real-time fluoroscopic imaging. The sclerosing  mass and coil pack were stable. No evidence of mobilization or flow. The microcatheter was removed. The occlusion balloon was brought back into the left renal vein. Left renal venography was performed confirming persistent patency of the renal vein. No evidence of complication. The sheath and occlusion balloon were removed. Hemostasis was attained with the assistance of  manual pressure. IMPRESSION: 1. Successful coil embolization of non bleeding accessory portosystemic collaterals (inferior phrenic an accessory inferior phrenic veins) which was required to successfully treated the bleeding gastric varices. 2. Successful coil assisted balloon occluded transvenous obliteration (CA-BRTO) of gastric varices using liquid sclerosant and detachable coils. PLAN: 1. Maintain bed rest overnight. Clear liquids are okay. Advanced diet as tolerated beginning tomorrow if patient remains stable. 2. BRTO protocol CT scan of the abdomen on Tuesday. 3. Patient will require outpatient follow-up EGD in approximately 6 weeks to assess for new or progressive esophageal varices and to assess the treated gastric varices. 4. Follow-up with Interventional Radiology in 3 months. Signed, Sterling Big, MD, RPVI Vascular and Interventional Radiology Special[facility] Radiology Electronically Signed   By: Malachy Moan M.D.   On: 04/20/2020 15:16   ECHOCARDIOGRAM COMPLETE  Result Date: 04/19/2020    ECHOCARDIOGRAM REPORT   Patient Name:   FELDER LEBEDA Heron Date of Exam: 04/19/2020 Medical Rec #:  [id number]        Height:       75.0 in Accession #:    [id number]       Weight:       175.0 lb Date of Birth:  07-28-1933        BSA:          2.074 m Patient Age:    86 years         BP:           112/58 mmHg Patient Gender: M                HR:           78 bpm. Exam Location:  Inpatient Procedure: 2D Echo Indications:    elevated troponin  History:        Patient has prior history of Echocardiogram examinations, most                  recent 09/04/2013. Arrythmias:Atrial Fibrillation.  Sonographer:    Delcie Roch Referring Phys: [id number] Azucena Fallen  Sonographer Comments: Image acquisition challenging due to respiratory motion. IMPRESSIONS  1. Left ventricular ejection fraction, by estimation, is 55 to 60%. The left ventricle has normal function. The left ventricle demonstrates regional wall motion abnormalities (see scoring diagram/findings for description). Left ventricular diastolic parameters are consistent with Grade I diastolic dysfunction (impaired relaxation).  2. Right ventricular systolic function is normal. The right ventricular size is normal.  3. Left atrial size was mildly dilated.  4. Moderate pleural effusion in the left lateral region.  5. The mitral valve is degenerative. Moderate mitral valve regurgitation. No evidence of mitral stenosis.  6. The aortic valve is calcified. There is moderate calcification of the aortic valve. There is moderate thickening of the aortic valve. Aortic valve regurgitation is moderate. Moderate aortic valve stenosis. Aortic valve area, by VTI measures 1.17 cm.  Aortic valve mean gradient measures 16.0 mmHg. Aortic valve Vmax measures 3.03 m/s.  7. The inferior vena cava is normal in size with greater than 50% respiratory variability, suggesting right atrial pressure of 3 mmHg. FINDINGS  Left Ventricle: Left ventricular ejection fraction, by estimation, is 55 to 60%. The left ventricle has normal function. The left ventricle demonstrates regional wall motion abnormalities. The left ventricular internal cavity size was normal in size. There is no left ventricular hypertrophy. Left ventricular diastolic parameters are consistent with Grade I diastolic dysfunction (impaired relaxation).  LV Wall Scoring: The apex is akinetic. Right  Ventricle: The right ventricular size is normal. No increase in right ventricular wall thickness. Right ventricular systolic function is normal. Left Atrium: Left  atrial size was mildly dilated. Right Atrium: Right atrial size was normal in size. Pericardium: There is no evidence of pericardial effusion. Mitral Valve: The mitral valve is degenerative in appearance. There is mild thickening of the mitral valve leaflet(s). There is mild calcification of the mitral valve leaflet(s). Moderate mitral valve regurgitation. No evidence of mitral valve stenosis. Tricuspid Valve: The tricuspid valve is normal in structure. Tricuspid valve regurgitation is mild . No evidence of tricuspid stenosis. Aortic Valve: The aortic valve is calcified. There is moderate calcification of the aortic valve. There is moderate thickening of the aortic valve. Aortic valve regurgitation is moderate. Moderate aortic stenosis is present. Aortic valve mean gradient measures 16.0 mmHg. Aortic valve peak gradient measures 36.8 mmHg. Aortic valve area, by VTI measures 1.17 cm. Pulmonic Valve: The pulmonic valve was normal in structure. Pulmonic valve regurgitation is not visualized. No evidence of pulmonic stenosis. Aorta: The aortic root is normal in size and structure. Venous: The inferior vena cava is normal in size with greater than 50% respiratory variability, suggesting right atrial pressure of 3 mmHg. IAS/Shunts: No atrial level shunt detected by color flow Doppler. Additional Comments: There is a moderate pleural effusion in the left lateral region.  LEFT VENTRICLE PLAX 2D LVIDd:         5.20 cm LVIDs:         4.10 cm LV PW:         1.00 cm LV IVS:        1.00 cm LVOT diam:     1.80 cm LV SV:         65 LV SV Index:   31 LVOT Area:     2.54 cm  RIGHT VENTRICLE             IVC RV S prime:     12.50 cm/s  IVC diam: 2.10 cm TAPSE (M-mode): 1.9 cm LEFT ATRIUM             Index       RIGHT ATRIUM           Index LA diam:        4.10 cm 1.98 cm/m  RA Area:     17.30 cm LA Vol (A2C):   74.9 ml 36.12 ml/m RA Volume:   41.80 ml  20.16 ml/m LA Vol (A4C):   69.0 ml 33.28 ml/m LA Biplane Vol: 72.7 ml 35.06  ml/m  AORTIC VALVE AV Area (Vmax):    1.01 cm AV Area (Vmean):   1.08 cm AV Area (VTI):     1.17 cm AV Vmax:           303.33 cm/s AV Vmean:          184.000 cm/s AV VTI:            0.554 m AV Peak Grad:      36.8 mmHg AV Mean Grad:      16.0 mmHg LVOT Vmax:         120.00 cm/s LVOT Vmean:        77.800 cm/s LVOT VTI:          0.255 m LVOT/AV VTI ratio: 0.46  AORTA Ao Root diam: 3.30 cm  SHUNTS Systemic VTI:  0.25 m Systemic Diam: 1.80 cm Donato Schultz MD Electronically signed by Donato Schultz MD Signature Date/Time: 04/19/2020/4:01:15  PM    Final    IR EMBO VENOUS NOT HEMORR HEMANG  INC GUIDE ROADMAPPING  Result Date: 04/20/2020 CLINICAL DATA:  87 year old male with 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 He presents today for balloon occluded transvenous obliteration of gastric varices. EXAM: 1. Ultrasound-guided access right internal jugular vein 2. Catheterization and venogram, left renal vein 3. Catheterization and venogram of gastro renal shunt 4. Catheterization and venogram of left inferior phrenic vein 5. Coil embolization of left inferior phrenic vein 6. Catheterization of venogram of accessory left inferior phrenic vein 7. Coil embolization of accessory left inferior phrenic vein 8. Embolization of gastric varices and gastro renal shunt MEDICATIONS: None. ANESTHESIA/SEDATION: 2 mg Versed and 100 mcg fentanyl were administered intravenously. Moderate Sedation Time:  64 minutes The patient was continuously monitored during the procedure by the interventional radiology nurse under my direct supervision. CONTRAST:  120 mL Omnipaque 350 FLUOROSCOPY TIME:  Fluoroscopy Time: 40 minutes 30 seconds (1472 mGy). COMPLICATIONS: None immediate. PROCEDURE: Informed written consent was obtained from the patient after a thorough discussion of the procedural risks, benefits  and alternatives. All questions were addressed. Maximal Sterile Barrier Technique was utilized including caps, mask, sterile gowns, sterile gloves, sterile drape, hand hygiene and skin antiseptic. A timeout was performed prior to the initiation of the procedure. The right internal jugular vein was interrogated with ultrasound and found to be widely patent. An image was obtained and stored for the medical record. Local anesthesia was attained by infiltration with 1% lidocaine. A small dermatotomy was made. Under real-time sonographic guidance, the vessel was punctured with a 21 gauge micropuncture needle. Using standard technique, the initial micro needle was exchanged over a 0.018 micro wire for a transitional 4 [place] micro sheath. The micro sheath was then exchanged over a 0.035 wire for a 10 French fascial dilator and the soft tissue tract was dilated. Next, a 10 French tips sheath was carefully advanced over the wire and into the inferior vena cava. An angled 5 French catheter was then advanced over the wire. The catheter was used to select the left renal vein. A left renal venogram was performed. Standard venous anatomy. No evidence of left renal venous thrombus. A rose in wire was parked in an inferior branch of the left renal vein. The tips sheath was then advanced into the origin of the renal vein. Additional left renal venography was performed. The inflow of the gastro renal shunt was successfully identified along the superior margin of the left renal vein. The angled catheter was introduced through the tips sheath coaxially adjacent to the stabilizing Rosen wire. Utilizing a Glidewire, the catheter was successfully advanced into proximal left gastro renal shunt. Venography was again performed. The renal shunt is robust. The expected web near the origin was identified. Flow direction is toward the renal vein. The Glidewire and catheter were advanced deeper into the gastro renal shunt. The glidewire was  exchanged for a superstiff Amplatz wire. The 5 French catheter was removed. Next, attempts were made to advance several occlusion balloons into the gastro renal shunt. Ultimately, a 64 French Merci balloon occlusion catheter was successfully advanced into the gastro renal shunt. The occlusion balloon was inflated and pulled snug against the web near the gastro renal shunt origin. Balloon occluded venography was then performed. The gastro renal shunt is quite massive. At least 2 large draining veins are identified  escaping the gastro renal shunt and draining into the systemic system. These appear to represent the inferior phrenic vein and a second accessory inferior phrenic vein. Coil embolization of these non bleeding vessels will likely be required in order to obtain stasis within the recently hemorrhaged gastric varices. A penumbra lantern microcatheter was advanced coaxially through the balloon occlusion catheter over a 0.016 fathom wire. The catheter was successfully used to select the inferior phrenic vein. The catheter was advanced into the inferior phrenic vein and venography was performed confirming catheter location. The inferior phrenic vein does drain into the systemic symptom and provide an outlet from the gastro renal shunt. Coil embolization was then performed using a series of 3 and 4 mm soft detachable Ruby coils. The microcatheter was brought back into the main body of the gastro renal shunt. The catheter was carefully advanced deeper into the gastro renal shunt and then used to successfully catheterize the next major escape vessel. The catheter was advanced into the vessel and venography performed. This appears to be an accessory inferior phrenic vein which drains into the azygos and hemi azygous system. This vessel must be sacrificed in order to proceed with obliteration of the target gastric varices. Therefore, coil embolization was performed utilizing a combination of 3, 4 and 5 mm Ruby  detachable coils. The microcatheter was then brought back into the gastro renal shunt. Additional venography was performed, this time with carbon dioxide gas. The carbon dioxide gas successfully opacifies the entirety of the gastric variceal network. The afferent vein from the portal venous system appears to be the posterior gastric vein. Sclerosis and mixture was then prepared in the standard fashion (3: 2 : 1 mixture of air : 3% sodium tetradecyl Sulfate : Lipiodol). The sclerosis mixture was then instilled in aliquots through the lantern microcatheter filling the gastro renal shunt and ultimately the gastric varices. Injection was stopped when sclerosis and began to spill into the posterior gastric vein. Approximately 10 mL 3% sodium tetradecyl Sulfate and 4 mL Lipiodol were administered. The microcatheter was then brought back into the proximal gastro renal shunt just beyond the occlusion balloon. Coil embolization of the proximal gastro renal shunt was then performed utilizing numerous 18, 16, 14 mm detachable Ruby microcoils as well as multiple liquid metal packing coils. Once a dense metal plug was successfully created, the occlusion balloon was slowly deflated under real-time fluoroscopic imaging. The sclerosing mass and coil pack were stable. No evidence of mobilization or flow. The microcatheter was removed. The occlusion balloon was brought back into the left renal vein. Left renal venography was performed confirming persistent patency of the renal vein. No evidence of complication. The sheath and occlusion balloon were removed. Hemostasis was attained with the assistance of manual pressure. IMPRESSION: 1. Successful coil embolization of non bleeding accessory portosystemic collaterals (inferior phrenic an accessory inferior phrenic veins) which was required to successfully treated the bleeding gastric varices. 2. Successful coil assisted balloon occluded transvenous obliteration (CA-BRTO) of gastric  varices using liquid sclerosant and detachable coils. PLAN: 1. Maintain bed rest overnight. Clear liquids are okay. Advanced diet as tolerated beginning tomorrow if patient remains stable. 2. BRTO protocol CT scan of the abdomen on Tuesday. 3. Patient will require outpatient follow-up EGD in approximately 6 weeks to assess for new or progressive esophageal varices and to assess the treated gastric varices. 4. Follow-up with Interventional Radiology in 3 months. Signed, Sterling Big, MD, RPVI [facility] Electronically Signed  By: Malachy Moan M.D.   On: 04/20/2020 15:16   CT Angio Abd/Pel w/ and/or w/o  Result Date: 04/22/2020 CLINICAL DATA:  Cryptogenic cirrhosis, ascites, large gastric varices status post acute gastric variceal bleed. Status post BRTO EXAM: CT ANGIOGRAPHY ABDOMEN AND PELVIS WITH CONTRAST AND WITHOUT CONTRAST TECHNIQUE: Multidetector CT imaging of the abdomen and pelvis was performed using the standard protocol during bolus administration of intravenous contrast. Multiplanar reconstructed images and MIPs were obtained and reviewed to evaluate the vascular anatomy. CONTRAST:  OMNIPAQUE IOHEXOL 350 MG/ML SOLN COMPARISON:  04/17/2020 FINDINGS: VASCULAR Aorta: Negative for aneurysm or dissection. No occlusive process. Aortic atherosclerosis again noted. No retroperitoneal hemorrhage, hematoma, or evidence of rupture. Celiac: Remains patent including its branches. No evidence of active arterial bleeding in the celiac territory. SMA: Remains widely patent including its branches. No active arterial bleeding in the SMA vascular territory. Renals: Atherosclerotic origins with mild ostial narrowings but no significant renal artery stenosis appreciated by CTA. IMA: IMA remains patent off the distal aorta including its branches. Inflow: Tortuous and atherosclerotic iliac vasculature without dissection, aneurysm, occlusive disease.  No inflow disease. Proximal Outflow: Atherosclerotic changes but the common femoral, proximal profunda femoral, and proximal superficial femoral arteries demonstrated are all patent. Veins: There are small amounts of radiopaque embolic material in the main portal vein and also in the SMV. There is also nonocclusive hypoechoic thrombus in the portal SMV confluence. These changes are best demonstrated on images 28 through 40 of series 8. The main portal vein, left and right portal veins, splenic and mesenteric veins remain patent. Hepatic veins are all patent. No other abdominopelvic veno-occlusive process. Review of the MIP images confirms the above findings. NON-VASCULAR Lower chest: Similar large left effusion with left lower lobe collapse/consolidation. Heart is enlarged. Coronary atherosclerosis noted. No pericardial effusion. Hepatobiliary: Stable cirrhotic changes of the liver without biliary dilatation or obstruction. Gallbladder is distended with some layering hyperdense material, suspect vicarious contrast excretion. Difficult to exclude gallstones. No focal hepatic abnormality. Common bile duct nondilated. Pancreas: Marked atrophy of the pancreas as before. Stable small cystic lesion of the pancreas tail. Chronic pancreas calcifications noted. No new finding. Spleen: Normal in size without focal abnormality. Adrenals/Urinary Tract: Normal right adrenal gland. Left adrenal gland is obscured by the artifact from the radiopaque BRTO embolic material. Kidneys demonstrate symmetric enhancement without obstruction or hydronephrosis. Ureters are symmetric and decompressed. Right bladder diverticula again noted posteriorly. Excreted contrast noted within the bladder. Stomach/Bowel: Negative for bowel obstruction, significant dilatation, or ileus. Scattered colonic diverticulosis. Status post interval BRTO. Gastric varices and gastro renal shunt appear occluded following the procedure. Similar degree of diffuse  abdominopelvic mesenteric edema and small amount of abdominopelvic ascites. Lymphatic: No bulky adenopathy. Reproductive: Very large bilateral inguinal hernias containing similar volume of ascites and bowel loops. No evidence of incarceration. Other: Intact abdominal wall. No ventral hernia. Similar volume of abdominopelvic ascites. Musculoskeletal: Bones are osteopenic. Degenerative changes of the spine. No acute osseous IMPRESSION: VASCULAR Negative for active arterial GI bleeding. Status post BRTO with complete occlusion of the gastric varices and the gastro renal shunt. Small amount of nonocclusive BRTO embolic material and adjacent nonocclusive venous thrombus at the portal SMV confluence as detailed above. Splenic, mesenteric, portal and hepatic veins all remain patent. NON-VASCULAR Stable large left effusion with left lower lobe collapse/consolidation Hepatic cirrhosis Similar volume of abdominopelvic ascites and mesenteric edema. Diverticulosis Large bilateral inguinal hernias without incarceration Electronically Signed   By: Judie Petit.  Shick M.D.  On: 04/22/2020 16:23   CT Angio Abd/Pel W and/or Wo Contrast  Result Date: 04/17/2020 CLINICAL DATA:  GI bleeding. EXAM: CTA ABDOMEN AND PELVIS WITHOUT AND WITH CONTRAST TECHNIQUE: Multidetector CT imaging of the abdomen and pelvis was performed using the standard protocol during bolus administration of intravenous contrast. Multiplanar reconstructed images and MIPs were obtained and reviewed to evaluate the vascular anatomy. CONTRAST:  100mL OMNIPAQUE IOHEXOL 350 MG/ML SOLN COMPARISON:  None. FINDINGS: VASCULAR Aorta: Normal caliber aorta without aneurysm, dissection, vasculitis or significant stenosis. Atherosclerotic changes are noted throughout the abdominal aorta. Celiac: Patent without evidence of aneurysm, dissection, vasculitis or significant stenosis. SMA: Patent without evidence of aneurysm, dissection, vasculitis or significant stenosis. Renals: Both  renal arteries are patent without evidence of aneurysm, dissection, vasculitis, fibromuscular dysplasia or significant stenosis. There are 2 right renal arteries. IMA: Patent without evidence of aneurysm, dissection, vasculitis or significant stenosis. Inflow: Patent without evidence of aneurysm, dissection, vasculitis or significant stenosis. Proximal Outflow: Bilateral common femoral and visualized portions of the superficial and profunda femoral arteries are patent without evidence of aneurysm, dissection, vasculitis or significant stenosis. Veins: No obvious venous abnormality within the limitations of this arterial phase study. Review of the MIP images confirms the above findings. NON-VASCULAR Lower chest: There is a large left-sided pleural effusion that is only partially visualized. There is at least partial collapse of the left lower lobe.The heart size is normal. The intracardiac blood pool is hypodense relative to the adjacent myocardium consistent with anemia. Hepatobiliary: The liver is cirrhotic. There are large gastric varices with a gastro renal shunt. The portal vein is patent. There are esophageal varices. Normal gallbladder.There is no biliary ductal dilation. Pancreas: The pancreas is atrophic. Again noted is a low-attenuation, cystic appearing nodule at the pancreatic tail which is not substantially changed since 2015 is likely a benign process. Spleen: Unremarkable. Adrenals/Urinary Tract: --Adrenal glands: Unremarkable. --Right kidney/ureter: No hydronephrosis or radiopaque kidney stones. --Left kidney/ureter: No hydronephrosis or radiopaque kidney stones. --Urinary bladder: The urinary bladder is significantly distended. Stomach/Bowel: --Stomach/Duodenum: Large gastric varices are noted. --Small bowel: Unremarkable. --Colon: Rectosigmoid diverticulosis without acute inflammation. --Appendix: Normal. Lymphatic: --No retroperitoneal lymphadenopathy. --No mesenteric lymphadenopathy. --No pelvic  or inguinal lymphadenopathy. Reproductive: There are large bilateral inguinal hernias. On the left the inguinal hernia contains a large amount of ascites and portions of the sigmoid colon without evidence for obstruction. On the right, the hernia contains mostly fluid but a small portion of small bowel as well without evidence for obstruction. Other: There is a moderate to large volume of ascites in the abdomen. Musculoskeletal. Multilevel degenerative changes are noted throughout the lumbar spine. IMPRESSION: 1. No evidence for active arterial GI bleeding. 2. Cirrhosis with stigmata of portal hypertension including a moderate volume ascites and large gastric varices with a gastrorenal shunt. Small esophageal varices are also noted. The portal vein is patent. Splenic vein is patent. 3. Large partially visualized left-sided pleural effusion with collapse of the left lower lobe. 4. Very large bilateral inguinal hernias containing a large volume of ascites and bowel as detailed above. Aortic Atherosclerosis (ICD10-I70.0). Electronically Signed   By: Katherine Mantlehristopher  Green M.D.   On: 04/17/2020 18:38   IR Paracentesis  Result Date: 04/21/2020 INDICATION: Patient with a history of hepatic cirrhosis, portal hypertension and gastric varices. Patient is status post BRTO April 20, 2020. Patient presents today for a therapeutic and diagnostic paracentesis. EXAM: ULTRASOUND GUIDED PARACENTESIS MEDICATIONS: 1% lidocaine 10 mL COMPLICATIONS: None immediate. PROCEDURE: Informed written consent was obtained  from the patient after a discussion of the risks, benefits and alternatives to treatment. A timeout was performed prior to the initiation of the procedure. Initial ultrasound scanning demonstrates a large amount of ascites within the right lower abdominal quadrant. The right lower abdomen was prepped and draped in the usual sterile fashion. 1% lidocaine was used for local anesthesia. Following this, a 19 gauge, 7-cm, Yueh  catheter was introduced. An ultrasound image was saved for documentation purposes. The paracentesis was performed. The catheter was removed and a dressing was applied. The patient tolerated the procedure well without immediate post procedural complication. FINDINGS: A total of approximately 3.3 L of clear yellow fluid was removed. Samples were sent to the laboratory as requested by the clinical team. IMPRESSION: Successful ultrasound-guided paracentesis yielding 3.3 liters of peritoneal fluid. Read by: Alwyn Ren, NP Electronically Signed   By: Marliss Coots MD   On: 04/21/2020 14:07      Subjective: ***   Discharge Exam: Vitals:   04/25/20 0400 04/25/20 0625  BP: 105/61   Pulse: 67   Resp: 16 18  Temp: 97.9 F (36.6 C)   SpO2: 97%    Vitals:   04/24/20 2000 04/25/20 0000 04/25/20 0400 04/25/20 0625  BP: (!) 110/94 103/62 105/61   Pulse: 72 65 67   Resp: 17 18 16 18   Temp: 98.1 F (36.7 C) 97.6 F (36.4 C) 97.9 F (36.6 C)   TempSrc: Oral Oral Oral   SpO2: 98% 95% 97%   Weight:    90.3 kg  Height:        General: Pt is alert, awake, not in acute distress Cardiovascular: RRR, S1/S2 +, no rubs, no gallops Respiratory: CTA bilaterally, no wheezing, no rhonchi Abdominal: Soft, NT, ND, bowel sounds + Extremities: no edema, no cyanosis    The results of significant diagnostics from this hospitalization (including imaging, microbiology, ancillary and laboratory) are listed below for reference.     Microbiology: Recent Results (from the past 240 hour(s))  SARS Coronavirus 2 by RT PCR (hospital order, performed in [facility] hospital lab) Nasopharyngeal Nasopharyngeal Swab     Status: None   Collection Time: 04/17/20  5:14 PM   Specimen: Nasopharyngeal Swab  Result Value Ref Range Status   SARS Coronavirus 2 NEGATIVE NEGATIVE Final    Comment: (NOTE) SARS-CoV-2 target nucleic acids are NOT DETECTED.  The SARS-CoV-2 RNA is generally detectable in upper and  lower respiratory specimens during the acute phase of infection. The lowest concentration of SARS-CoV-2 viral copies this assay can detect is 250 copies / mL. A negative result does not preclude SARS-CoV-2 infection and should not be used as the sole basis for treatment or other patient management decisions.  A negative result may occur with improper specimen collection / handling, submission of specimen other than nasopharyngeal swab, presence of viral mutation(s) within the areas targeted by this assay, and inadequate number of viral copies (<250 copies / mL). A negative result must be combined with clinical observations, patient history, and epidemiological information.  Fact Sheet for Patients:   BoilerBrush.com.cy  Fact Sheet for Healthcare Providers: https://pope.com/  This test is not yet approved or  cleared by the [place] FDA and has been authorized for detection and/or diagnosis of SARS-CoV-2 by FDA under an Emergency Use Authorization (EUA).  This EUA will remain in effect (meaning this test can be used) for the duration of the COVID-19 declaration under Section 564(b)(1) of the Act, 21 U.S.C. section 360bbb-3(b)(1), unless the  authorization is terminated or revoked sooner.  Performed at [facility], [address] [place], [place] [address]   Culture, blood (routine x 2)     Status: None   Collection Time: 04/17/20  6:27 PM   Specimen: BLOOD  Result Value Ref Range Status   Specimen Description BLOOD BLOOD LEFT FOREARM  Final   Special Requests   Final    BOTTLES DRAWN AEROBIC AND ANAEROBIC Blood Culture adequate volume   Culture   Final    NO GROWTH 5 DAYS Performed at [facility], [address] [place], [place] [address]    Report Status 04/22/2020 FINAL  Final  Culture, blood (routine x 2)     Status: None   Collection Time: 04/17/20  6:31 PM   Specimen: BLOOD  Result Value Ref  Range Status   Specimen Description BLOOD BLOOD RIGHT WRIST  Final   Special Requests   Final    BOTTLES DRAWN AEROBIC AND ANAEROBIC Blood Culture results may not be optimal due to an inadequate volume of blood received in culture bottles   Culture   Final    NO GROWTH 5 DAYS Performed at [name], [address] [place], [place] [address]    Report Status 04/22/2020 FINAL  Final  Aerobic/Anaerobic Culture (surgical/deep wound)     Status: None   Collection Time: 04/18/20  3:33 PM   Specimen: PATH Cytology Peritoneal fluid  Result Value Ref Range Status   Specimen Description   Final    PERITONEAL Performed at [facility], [address], [place], [place] [address]    Special Requests   Final    PERITONEAL Performed at [facility], [address] [place], [place] [address]    Gram Stain   Final    FEW WBC PRESENT, PREDOMINANTLY MONONUCLEAR NO ORGANISMS SEEN    Culture   Final    No growth aerobically or anaerobically. Performed at [facility], [address], [place], [place] [address]    Report Status 04/24/2020 FINAL  Final     Labs: BNP (last 3 results) Recent Labs    04/17/20 1713  BNP 441.0*   Basic Metabolic Panel: Recent Labs  Lab 04/21/20 0136 04/22/20 0114 04/23/20 0126 04/24/20 0130 04/25/20 0214  NA 141 137 134* 136 137  K 3.7 3.5 3.5 3.3* 3.7  CL 112* 109 107 108 104  CO2 19* 21* 20* 21* 24  GLUCOSE 163* 191* 202* 184* 177*  BUN 35* 24* 22 19 17   CREATININE 1.06 0.93 0.97 1.12 1.15  CALCIUM 7.9* 7.9* 7.7* 7.4* 7.6*   Liver Function Tests: Recent Labs  Lab 04/22/20 0114 04/23/20 0126 04/24/20 0130 04/25/20 0214  AST 39 32 30 41  ALT 27 25 25 27   ALKPHOS 90 99 104 135*  BILITOT 1.0 0.7 0.8 0.9  PROT 5.5* 5.4* 5.3* 5.3*  ALBUMIN 2.4* 2.2* 2.0* 1.9*   No results for input(s): LIPASE, AMYLASE in the last 168 hours. No results for input(s): AMMONIA in the last 168 hours. CBC: Recent Labs  Lab  04/18/20 1436 04/18/20 1951 04/21/20 0136 04/22/20 0114 04/23/20 0126 04/24/20 0130 04/25/20 0214  WBC 10.6*   < > 8.9 8.1 9.9 9.2 10.5  NEUTROABS 8.9*  --   --   --   --   --   --   HGB 7.2*   < > 8.4* 7.6* 8.2* 8.3* 8.8*  HCT 21.4*   < > 25.4* 24.6*  24.8* 24.6* 27.4*  MCV 97.3   < > 99.6 101.2* 98.4 97.2 99.3  PLT 131*   < > 100* 90* 95* 105* 130*   < > = values in this interval not displayed.   Cardiac Enzymes: No results for input(s): CKTOTAL, CKMB, CKMBINDEX, TROPONINI in the last 168 hours. BNP: Invalid input(s): POCBNP CBG: Recent Labs  Lab 04/18/20 1303  GLUCAP 136*   D-Dimer No results for input(s): DDIMER in the last 72 hours. Hgb A1c No results for input(s): HGBA1C in the last 72 hours. Lipid Profile No results for input(s): CHOL, HDL, LDLCALC, TRIG, CHOLHDL, LDLDIRECT in the last 72 hours. Thyroid function studies No results for input(s): TSH, T4TOTAL, T3FREE, THYROIDAB in the last 72 hours.  Invalid input(s): FREET3 Anemia work up No results for input(s): VITAMINB12, FOLATE, FERRITIN, TIBC, IRON, RETICCTPCT in the last 72 hours. Urinalysis    Component Value Date/Time   COLORURINE YELLOW (A) 04/17/2020 1713   APPEARANCEUR CLEAR (A) 04/17/2020 1713   APPEARANCEUR Hazy 08/28/2013 2112   LABSPEC 1.032 (H) 04/17/2020 1713   LABSPEC 1.018 08/28/2013 2112   PHURINE 5.0 04/17/2020 1713   GLUCOSEU NEGATIVE 04/17/2020 1713   GLUCOSEU 150 mg/dL 16/12/9602 5409   HGBUR SMALL (A) 04/17/2020 1713   BILIRUBINUR NEGATIVE 04/17/2020 1713   BILIRUBINUR Negative 08/28/2013 2112   KETONESUR NEGATIVE 04/17/2020 1713   PROTEINUR NEGATIVE 04/17/2020 1713   NITRITE NEGATIVE 04/17/2020 1713   LEUKOCYTESUR NEGATIVE 04/17/2020 1713   LEUKOCYTESUR Negative 08/28/2013 2112   Sepsis Labs Invalid input(s): PROCALCITONIN,  WBC,  LACTICIDVEN Microbiology Recent Results (from the past 240 hour(s))  SARS Coronavirus 2 by RT PCR (hospital order, performed in [facility]  hospital lab) Nasopharyngeal Nasopharyngeal Swab     Status: None   Collection Time: 04/17/20  5:14 PM   Specimen: Nasopharyngeal Swab  Result Value Ref Range Status   SARS Coronavirus 2 NEGATIVE NEGATIVE Final    Comment: (NOTE) SARS-CoV-2 target nucleic acids are NOT DETECTED.  The SARS-CoV-2 RNA is generally detectable in upper and lower respiratory specimens during the acute phase of infection. The lowest concentration of SARS-CoV-2 viral copies this assay can detect is 250 copies / mL. A negative result does not preclude SARS-CoV-2 infection and should not be used as the sole basis for treatment or other patient management decisions.  A negative result may occur with improper specimen collection / handling, submission of specimen other than nasopharyngeal swab, presence of viral mutation(s) within the areas targeted by this assay, and inadequate number of viral copies (<250 copies / mL). A negative result must be combined with clinical observations, patient history, and epidemiological information.  Fact Sheet for Patients:   BoilerBrush.com.cy  Fact Sheet for Healthcare Providers: https://pope.com/  This test is not yet approved or  cleared by the [place] FDA and has been authorized for detection and/or diagnosis of SARS-CoV-2 by FDA under an Emergency Use Authorization (EUA).  This EUA will remain in effect (meaning this test can be used) for the duration of the COVID-19 declaration under Section 564(b)(1) of the Act, 21 U.S.C. section 360bbb-3(b)(1), unless the authorization is terminated or revoked sooner.  Performed at [facility], [address] [place], [place] [address]   Culture, blood (routine x 2)     Status: None   Collection Time: 04/17/20  6:27 PM   Specimen: BLOOD  Result Value Ref Range Status   Specimen Description BLOOD BLOOD LEFT FOREARM  Final   Special Requests   Final  BOTTLES  DRAWN AEROBIC AND ANAEROBIC Blood Culture adequate volume   Culture   Final    NO GROWTH 5 DAYS Performed at [facility], [address] [place], [place] [address]    Report Status 04/22/2020 FINAL  Final  Culture, blood (routine x 2)     Status: None   Collection Time: 04/17/20  6:31 PM   Specimen: BLOOD  Result Value Ref Range Status   Specimen Description BLOOD BLOOD RIGHT WRIST  Final   Special Requests   Final    BOTTLES DRAWN AEROBIC AND ANAEROBIC Blood Culture results may not be optimal due to an inadequate volume of blood received in culture bottles   Culture   Final    NO GROWTH 5 DAYS Performed at [facility], [address] [place], [place] [address]    Report Status 04/22/2020 FINAL  Final  Aerobic/Anaerobic Culture (surgical/deep wound)     Status: None   Collection Time: 04/18/20  3:33 PM   Specimen: PATH Cytology Peritoneal fluid  Result Value Ref Range Status   Specimen Description   Final    PERITONEAL Performed at [facility], [address], [place], [place] [address]    Special Requests   Final    PERITONEAL Performed at [facility], [address] [place], [place] [address]    Gram Stain   Final    FEW WBC PRESENT, PREDOMINANTLY MONONUCLEAR NO ORGANISMS SEEN    Culture   Final    No growth aerobically or anaerobically. Performed at [facility], [address], [place], [place] [address]    Report Status 04/24/2020 FINAL  Final     Time coordinating discharge: Over 30 minutes  SIGNED:   Azucena Fallen, DO [facility] 04/25/2020, 7:23 AM Pager   If 7PM-7AM, please contact night-coverage [email or web address]

## 2020-04-25 NOTE — TOC Progression Note (Signed)
Transition of Care [facility]) - Progression Note    Patient Details  Name: Elijah [name] MRN: [id number] Date of Birth: Jun 01, 1933  Transition of Care [facility]) CM/SW Contact  Eduard Roux, [place] Phone Number: 04/25/2020, 3:46 PM  Clinical Narrative:     [facility] - no bed until next week   Antony Blackbird, MSW, LCSW Clinical Social Worker   Expected Discharge Plan: Skilled Nursing Facility Barriers to Discharge: SNF Pending bed offer,Insurance Authorization (covid test needed before d/c to SNF)  Expected Discharge Plan and Services Expected Discharge Plan: Skilled Nursing Facility In-house Referral: Clinical Social Work                                             Social Determinants of Health (SDOH) Interventions    Readmission Risk Interventions No flowsheet data found.

## 2020-04-25 NOTE — NC FL2 (Signed)
  [place] MEDICAID FL2 LEVEL OF CARE SCREENING TOOL     IDENTIFICATION  Patient Name: Elijah [name] Birthdate: September 03, 1933 Sex: male Admission Date (Current Location): 04/18/2020  [facility] and [place] Number:  Producer, television/film/video and Address:  [facility] [place], [address], [place], [place] [address]      Provider Number: [id number]  Attending Physician Name and Address:  Azucena Fallen, MD  Relative Name and Phone Number:  Abbie Jablon    Current Level of Care: Hospital Recommended Level of Care: Skilled Nursing Facility Prior Approval Number:    Date Approved/Denied:   PASRR Number: [id number]  Discharge Plan: SNF    Current Diagnoses: Patient Active Problem List   Diagnosis Date Noted  . NSTEMI (non-ST elevated myocardial infarction) (HCC) 04/18/2020  . Blood loss anemia 04/18/2020  . Cirrhosis of liver (HCC) 04/18/2020  . GIB (gastrointestinal bleeding) 04/17/2020  . CVA (cerebral infarction) 09/11/2013    Orientation RESPIRATION BLADDER Height & Weight     Self,Time,Situation,Place  Normal Incontinent Weight: 199 lb (90.3 kg) Height:  6\' 3"  (190.5 cm)  BEHAVIORAL SYMPTOMS/MOOD NEUROLOGICAL BOWEL NUTRITION STATUS      Incontinent Diet (please see discharge summary)  AMBULATORY STATUS COMMUNICATION OF NEEDS Skin   Supervision Verbally Surgical wounds (Closed incision RT throat, wond/incision Anterior; distal left upper)                       Personal Care Assistance Level of Assistance  Bathing,Feeding,Dressing Bathing Assistance: Limited assistance Feeding assistance: Independent Dressing Assistance: Limited assistance     Functional Limitations Info  Sight,Hearing,Speech Sight Info: Adequate Hearing Info: Adequate Speech Info: Adequate    SPECIAL CARE FACTORS FREQUENCY  PT (By licensed PT),OT (By licensed OT)     PT Frequency: 5x per week OT Frequency: 5x per week            Contractures Contractures  Info: Not present    Additional Factors Info  Code Status,Allergies Code Status Info: DNR Allergies Info: NKA           Current Medications (04/25/2020):  This is the current hospital active medication list Current Facility-Administered Medications  Medication Dose Route Frequency Provider Last Rate Last Admin  . furosemide (LASIX) tablet 40 mg  40 mg Oral BID 06/23/2020, MD   40 mg at 04/25/20 06/23/20  . pantoprazole (PROTONIX) EC tablet 40 mg  40 mg Oral BID 7867, PA-C   40 mg at 04/25/20 06/23/20  . propranolol (INDERAL) tablet 10 mg  10 mg Oral BID 6720, MD   10 mg at 04/25/20 06/23/20  . [START ON 04/26/2020] spironolactone (ALDACTONE) tablet 50 mg  50 mg Oral Daily 06/24/2020, MD      . tamsulosin [facility]) capsule 0.4 mg  0.4 mg Oral QPC breakfast [facility] N, DO   0.4 mg at 04/25/20 06/23/20     Discharge Medications: Please see discharge summary for a list of discharge medications.  Relevant Imaging Results:  Relevant Lab Results:   Additional Information SSN [id number]   Family reports patent has received covid vaccines  [phone], LCSWA

## 2020-04-26 LAB — COMPREHENSIVE METABOLIC PANEL WITH GFR
ALT: 30 U/L (ref 0–44)
AST: 37 U/L (ref 15–41)
Albumin: 1.8 g/dL — ABNORMAL LOW (ref 3.5–5.0)
Alkaline Phosphatase: 162 U/L — ABNORMAL HIGH (ref 38–126)
Anion gap: 10 (ref 5–15)
BUN: 21 mg/dL (ref 8–23)
CO2: 23 mmol/L (ref 22–32)
Calcium: 7.3 mg/dL — ABNORMAL LOW (ref 8.9–10.3)
Chloride: 102 mmol/L (ref 98–111)
Creatinine, Ser: 1.19 mg/dL (ref 0.61–1.24)
GFR, Estimated: >60 mL/min
Glucose, Bld: 208 mg/dL — ABNORMAL HIGH (ref 70–99)
Potassium: 3.2 mmol/L — ABNORMAL LOW (ref 3.5–5.1)
Sodium: 135 mmol/L (ref 135–145)
Total Bilirubin: 1.1 mg/dL (ref 0.3–1.2)
Total Protein: 5.3 g/dL — ABNORMAL LOW (ref 6.5–8.1)

## 2020-04-26 LAB — CBC
HCT: 25.8 % — ABNORMAL LOW (ref 39.0–52.0)
Hemoglobin: 8.8 g/dL — ABNORMAL LOW (ref 13.0–17.0)
MCH: 33.1 pg (ref 26.0–34.0)
MCHC: 34.1 g/dL (ref 30.0–36.0)
MCV: 97 fL (ref 80.0–100.0)
Platelets: 137 K/uL — ABNORMAL LOW (ref 150–400)
RBC: 2.66 MIL/uL — ABNORMAL LOW (ref 4.22–5.81)
RDW: 18.6 % — ABNORMAL HIGH (ref 11.5–15.5)
WBC: 9.8 K/uL (ref 4.0–10.5)
nRBC: 0 % (ref 0.0–0.2)

## 2020-04-26 MED ORDER — POTASSIUM CHLORIDE CRYS ER 20 MEQ PO TBCR
20.0000 meq | EXTENDED_RELEASE_TABLET | Freq: Two times a day (BID) | ORAL | Status: DC
  Administered 2020-04-26 – 2020-04-27 (×3): 20 meq via ORAL
  Filled 2020-04-26 (×3): qty 1

## 2020-04-26 NOTE — Progress Notes (Signed)
PROGRESS NOTE    [name]  HWT:[id number] DOB: 03-14-1934 DOA: 04/18/2020 PCP: Jaclyn Shaggy, MD   Brief Narrative:  Elijah [name] is a 87 y.o. male with medical history significant for CVA in 2015, MSSA bacteremia, essential hypertension, hyperlipidemia, type 2 diabetes who initially presented to [facility] from home on 04/17/2020 due to 4 days of melena, weakness, fatigue, chest pain, and shortness of breath.  Work-up revealed symptomatic blood loss anemia with initial hemoglobin of 5.7K and NSTEMI.  Admitted to the ICU initially, received 3u PRBC to maintain hemoglobin.  Patient evaluated by GI, ICU, cardiology at [facility]. Currently on Protonix drip, received octreotide and Rocephin after CT angio abdomen and pelvis showed cirrhotic liver morphology with large gastric varices. EGD done on 04/18/2020 which showed very large gastric varix without any active bleeding but with evidence of recent bleeding.  Small nonbleeding superficial varices were also noted.  He also had diagnostic paracentesis, no evidence of SBP. Seen by cardiology felt that his uptrending troponins and EKG abnormalities were consistent with NSTEMI in the setting of acute blood loss anemia.  Recommended echocardiogram as well as beta-blockade when able to tolerate; nitro for symptomatic chest pain. GI recommended evaluation for TIPS or BRTO given high risk for rebleeding from the morphology of the size of the gastric varix and to transfer to [facility] for IR guided TIPS.  1/30 - Echocardiogram without overt findings per IR will likely proceed with TIPS and CA-BRTO procedure later today pending schedule. 1/31 - Tolerated procedure well - patient will need EGD 6 weeks; IR f/up in 6months - 3.3L paracentesis 2/1 - CTA shows complete occlusion of gastric varices 2/2 - Advancing diet, ongoing ascites, will initiate and escalate diuretics (Lasix/spironolactone) as appropriate/holding propranolol given borderline low blood pressure 2/3-4  - Continue to increase Lasix and spironolactone, will add low-dose propranolol today given improvement in patient's blood pressure.  CIR declined by insurance but they are agreeable for transition to SNF which patient is agreeable for as well.  Will pursue discharge to SNF in the next 24 hours as patient continues to improve. 2/5 - abdomen distention improving - minimal hypokalemia in setting of diuresis  Patient remains medically stable for discharge, awaiting bed/insurance authorization  Assessment & Plan:  Newly diagnosed cirrhosis of the liver with large acutely bleeding gastric varices, resolved Concurrent ascites, improving GI and IR following: TIPS with BRTO 04/21/20 - tolerated well. Paracentesis 04/22/09 - 3.3 L, SAAG = >1 consistent with ascites due to portal hypertension CTA abdomen post procedure shows improvement in varices Continue spironolactone, Lasix, and low-dose propranolol - add potassium 20BID to offset losses - monitor closely on spironolactone.  Acute blood loss anemia secondary to upper GI bleed post EGD on 04/18/2020. Hemoglobin of 5.7 on presentation - stabilizing now >8 Status post 3u PRBC transfusion since admission EGD 04/18/2020 showed very large gastric varix without any active bleeding but with evidence of recent bleeding.  Small nonbleeding superficial varices were also noted.  Status post TIPS/BRTO (see above) Protonix/octreotide drips discontinued  NSTEMI in the setting of marked acute blood loss anemia resolved. Presented with hemoglobin of 5.7K, baseline hemoglobin 10. Troponin has peaked at greater than 3700 and downtrending appropriately. Remains asymptomatic, cardiology discussion for preoperative clearance, they concur with previous note by Dr. Juliann Pares at [facility], given echocardiogram  Acute onset ascites from worsening cirrhosis IR following, likely require multiple paracentesis for symptom control during hospital stay Continue Lasix, spironolactone,  propranolol as above - appears to have  appropriate volume control at this point Paracentesis 04/22/2020 successful for 3L without complication, continue to follow clinically -appears to be improving daily with diuretics  Type 2 diabetes controlled, well controlled Lab Results  Component Value Date   HGBA1C 5.4 04/18/2020  Hold off home oral antiglycemic meds Start insulin sliding scale and hypoglycemic protocol Consider de-escalation of home antiglycemic's in the setting of tightly controlled glucose  Isolated elevated AST Avoid hepatotoxic agents Hold off on statin for now  BPH Resume home Flomax Monitor urine output  Physical debility/Ambulatory dysfunction PT to assess Fall precautions.  DVT prophylaxis: SCDs; avoid chemical prophylaxis given above Code Status: DNR confirmed at admission Family Communication: None available  Status is: Inpatient  Dispo: The patient is from: Home              Anticipated d/c is to: SNF, CIR declined by insurance              Anticipated d/c date is: 24-48h              Patient currently IS medically stable for discharge  Consultants:   PCCM, IR, GI, cardiology  Procedures:   Upper endoscopy 04/18/2020  TIPS/BRTO pending clearance  Antimicrobials:  Ceftriaxone  Subjective: No acute issues or events overnight, patient denies nausea vomiting diarrhea constipation headache fevers or chills.  Feels his abdominal swelling is improving daily, reports increased urine output over the past 48 hours.  Objective: Vitals:   04/25/20 1941 04/25/20 2029 04/25/20 2313 04/26/20 0448  BP: 114/68   (!) 97/54  Pulse: 71 80  64  Resp: (!) 22   16  Temp: 98.1 F (36.7 C)  98 F (36.7 C) 97.9 F (36.6 C)  TempSrc: Oral  Oral Oral  SpO2: 100%   96%  Weight:   90 kg   Height:        Intake/Output Summary (Last 24 hours) at 04/26/2020 0810 Last data filed at 04/25/2020 1807 Gross per 24 hour  Intake 480 ml  Output --  Net 480 ml   Filed  Weights   04/23/20 0645 04/25/20 0625 04/25/20 2313  Weight: 89.8 kg 90.3 kg 90 kg    Examination:  General:  Pleasantly resting in bed, No acute distress. HEENT:  Normocephalic atraumatic.  Sclerae nonicteric, noninjected.  Extraocular movements intact bilaterally. Neck:  Without mass or deformity.  Trachea is midline. Lungs:  Clear to auscultate bilaterally without rhonchi, wheeze, or rales. Heart:  Regular rate and rhythm.  Without murmurs, rubs, or gallops. Abdomen:  Soft, nontender, moderately distended, nontympanic.  Without guarding or rebound. Extremities: Without cyanosis, clubbing, edema, or obvious deformity. Vascular:  Dorsalis pedis and posterior tibial pulses palpable bilaterally. Skin:  Warm and dry, no erythema, no ulcerations.   Data Reviewed: I have personally reviewed following labs and imaging studies  CBC: Recent Labs  Lab 04/22/20 0114 04/23/20 0126 04/24/20 0130 04/25/20 0214 04/26/20 0128  WBC 8.1 9.9 9.2 10.5 9.8  HGB 7.6* 8.2* 8.3* 8.8* 8.8*  HCT 24.6* 24.8* 24.6* 27.4* 25.8*  MCV 101.2* 98.4 97.2 99.3 97.0  PLT 90* 95* 105* 130* 137*   Basic Metabolic Panel: Recent Labs  Lab 04/22/20 0114 04/23/20 0126 04/24/20 0130 04/25/20 0214 04/26/20 0128  NA 137 134* 136 137 135  K 3.5 3.5 3.3* 3.7 3.2*  CL 109 107 108 104 102  CO2 21* 20* 21* 24 23  GLUCOSE 191* 202* 184* 177* 208*  BUN 24* 22 19 17 21   CREATININE 0.93 0.97  1.12 1.15 1.19  CALCIUM 7.9* 7.7* 7.4* 7.6* 7.3*   GFR: Estimated Creatinine Clearance: 55.2 mL/min (by C-G formula based on SCr of 1.19 mg/dL). Liver Function Tests: Recent Labs  Lab 04/22/20 0114 04/23/20 0126 04/24/20 0130 04/25/20 0214 04/26/20 0128  AST 39 32 30 41 37  ALT 27 25 25 27 30   ALKPHOS 90 99 104 135* 162*  BILITOT 1.0 0.7 0.8 0.9 1.1  PROT 5.5* 5.4* 5.3* 5.3* 5.3*  ALBUMIN 2.4* 2.2* 2.0* 1.9* 1.8*   No results for input(s): LIPASE, AMYLASE in the last 168 hours. No results for input(s): AMMONIA  in the last 168 hours. Coagulation Profile: No results for input(s): INR, PROTIME in the last 168 hours. Cardiac Enzymes: No results for input(s): CKTOTAL, CKMB, CKMBINDEX, TROPONINI in the last 168 hours. BNP (last 3 results) No results for input(s): PROBNP in the last 8760 hours. HbA1C: No results for input(s): HGBA1C in the last 72 hours. CBG: No results for input(s): GLUCAP in the last 168 hours. Lipid Profile: No results for input(s): CHOL, HDL, LDLCALC, TRIG, CHOLHDL, LDLDIRECT in the last 72 hours. Thyroid Function Tests: No results for input(s): TSH, T4TOTAL, FREET4, T3FREE, THYROIDAB in the last 72 hours. Anemia Panel: No results for input(s): VITAMINB12, FOLATE, FERRITIN, TIBC, IRON, RETICCTPCT in the last 72 hours. Sepsis Labs: No results for input(s): PROCALCITON, LATICACIDVEN in the last 168 hours.  Recent Results (from the past 240 hour(s))  SARS Coronavirus 2 by RT PCR (hospital order, performed in [facility] hospital lab) Nasopharyngeal Nasopharyngeal Swab     Status: None   Collection Time: 04/17/20  5:14 PM   Specimen: Nasopharyngeal Swab  Result Value Ref Range Status   SARS Coronavirus 2 NEGATIVE NEGATIVE Final    Comment: (NOTE) SARS-CoV-2 target nucleic acids are NOT DETECTED.  The SARS-CoV-2 RNA is generally detectable in upper and lower respiratory specimens during the acute phase of infection. The lowest concentration of SARS-CoV-2 viral copies this assay can detect is 250 copies / mL. A negative result does not preclude SARS-CoV-2 infection and should not be used as the sole basis for treatment or other patient management decisions.  A negative result may occur with improper specimen collection / handling, submission of specimen other than nasopharyngeal swab, presence of viral mutation(s) within the areas targeted by this assay, and inadequate number of viral copies (<250 copies / mL). A negative result must be combined with clinical observations,  patient history, and epidemiological information.  Fact Sheet for Patients:   04/19/20  Fact Sheet for Healthcare Providers: BoilerBrush.com.cy  This test is not yet approved or  cleared by the https://pope.com/ FDA and has been authorized for detection and/or diagnosis of SARS-CoV-2 by FDA under an Emergency Use Authorization (EUA).  This EUA will remain in effect (meaning this test can be used) for the duration of the COVID-19 declaration under Section 564(b)(1) of the Act, 21 U.S.C. section 360bbb-3(b)(1), unless the authorization is terminated or revoked sooner.  Performed at [facility], [address] [place], [place]   Culture, blood (routine x 2)     Status: None   Collection Time: 04/17/20  6:27 PM   Specimen: BLOOD  Result Value Ref Range Status   Specimen Description BLOOD BLOOD LEFT FOREARM  Final   Special Requests   Final    BOTTLES DRAWN AEROBIC AND ANAEROBIC Blood Culture adequate volume   Culture   Final    NO GROWTH 5 DAYS Performed at [facility],  [address] [place], [place] [address]    Report Status 04/22/2020 FINAL  Final  Culture, blood (routine x 2)     Status: None   Collection Time: 04/17/20  6:31 PM   Specimen: BLOOD  Result Value Ref Range Status   Specimen Description BLOOD BLOOD RIGHT WRIST  Final   Special Requests   Final    BOTTLES DRAWN AEROBIC AND ANAEROBIC Blood Culture results may not be optimal due to an inadequate volume of blood received in culture bottles   Culture   Final    NO GROWTH 5 DAYS Performed at [facility], [address], [place], [place] [address]    Report Status 04/22/2020 FINAL  Final  Aerobic/Anaerobic Culture (surgical/deep wound)     Status: None   Collection Time: 04/18/20  3:33 PM   Specimen: PATH Cytology Peritoneal fluid  Result Value Ref Range Status   Specimen Description   Final    PERITONEAL Performed  at [facility], [address], [place], [place] [address]    Special Requests   Final    PERITONEAL Performed at [facility], [address] [place], [place] [address]    Gram Stain   Final    FEW WBC PRESENT, PREDOMINANTLY MONONUCLEAR NO ORGANISMS SEEN    Culture   Final    No growth aerobically or anaerobically. Performed at [facility], [address], [place], [place] [address]    Report Status 04/24/2020 FINAL  Final         Radiology Studies: No results found. Scheduled Meds: . furosemide  40 mg Oral BID  . pantoprazole  40 mg Oral BID  . propranolol  10 mg Oral BID  . spironolactone  50 mg Oral Daily  . tamsulosin  0.4 mg Oral QPC breakfast   Continuous Infusions:   LOS: 8 days   Time spent:  Azucena Fallen, DO [facility]  If 7PM-7AM, please contact night-coverage [email or web address]  04/26/2020, 8:10 AM

## 2020-04-27 ENCOUNTER — Other Ambulatory Visit: Payer: Self-pay | Admitting: Internal Medicine

## 2020-04-27 DIAGNOSIS — K7469 Other cirrhosis of liver: Secondary | ICD-10-CM

## 2020-04-27 DIAGNOSIS — I214 Non-ST elevation (NSTEMI) myocardial infarction: Secondary | ICD-10-CM

## 2020-04-27 LAB — COMPREHENSIVE METABOLIC PANEL
ALT: 40 U/L (ref 0–44)
AST: 63 U/L — ABNORMAL HIGH (ref 15–41)
Albumin: 1.7 g/dL — ABNORMAL LOW (ref 3.5–5.0)
Alkaline Phosphatase: 193 U/L — ABNORMAL HIGH (ref 38–126)
Anion gap: 8 (ref 5–15)
BUN: 21 mg/dL (ref 8–23)
CO2: 23 mmol/L (ref 22–32)
Calcium: 7.5 mg/dL — ABNORMAL LOW (ref 8.9–10.3)
Chloride: 105 mmol/L (ref 98–111)
Creatinine, Ser: 1.2 mg/dL (ref 0.61–1.24)
GFR, Estimated: 60 mL/min — ABNORMAL LOW (ref >60)
Glucose, Bld: 196 mg/dL — ABNORMAL HIGH (ref 70–99)
Potassium: 4 mmol/L (ref 3.5–5.1)
Sodium: 136 mmol/L (ref 135–145)
Total Bilirubin: 1.3 mg/dL — ABNORMAL HIGH (ref 0.3–1.2)
Total Protein: 5.2 g/dL — ABNORMAL LOW (ref 6.5–8.1)

## 2020-04-27 LAB — CBC
HCT: 26.5 % — ABNORMAL LOW (ref 39.0–52.0)
Hemoglobin: 8.9 g/dL — ABNORMAL LOW (ref 13.0–17.0)
MCH: 32.2 pg (ref 26.0–34.0)
MCHC: 33.6 g/dL (ref 30.0–36.0)
MCV: 96 fL (ref 80.0–100.0)
Platelets: 144 10*3/uL — ABNORMAL LOW (ref 150–400)
RBC: 2.76 MIL/uL — ABNORMAL LOW (ref 4.22–5.81)
RDW: 18.5 % — ABNORMAL HIGH (ref 11.5–15.5)
WBC: 9.3 10*3/uL (ref 4.0–10.5)
nRBC: 0 % (ref 0.0–0.2)

## 2020-04-27 MED ORDER — SPIRONOLACTONE 50 MG PO TABS: 50.0000 mg | ORAL_TABLET | Freq: Every day | ORAL | 0 refills | Status: AC

## 2020-04-27 MED ORDER — PANTOPRAZOLE SODIUM 40 MG PO TBEC: 40.0000 mg | DELAYED_RELEASE_TABLET | Freq: Two times a day (BID) | ORAL | 0 refills | Status: AC

## 2020-04-27 MED ORDER — PROPRANOLOL HCL 10 MG PO TABS: 10.0000 mg | ORAL_TABLET | Freq: Two times a day (BID) | ORAL | 0 refills | Status: AC

## 2020-04-27 MED ORDER — FUROSEMIDE 40 MG PO TABS: 40.0000 mg | ORAL_TABLET | Freq: Two times a day (BID) | ORAL | 0 refills | Status: DC

## 2020-04-27 NOTE — Progress Notes (Signed)
Called son Barry's cell phone and left message regarding pt's discharge and approximate time of his departure.

## 2020-04-27 NOTE — TOC Progression Note (Addendum)
Transition of Care [facility]) - Progression Note    Patient Details  Name: Elijah [name] MRN: [id number] Date of Birth: [birth year]  [facility]) CM/SW Contact  Patrice [place], [place] Phone Number: [phone] 04/27/2020, 10:21 AM  Clinical Narrative:    CSW attempted to reach son Gery Pray with the 2 bed offers however had to leave VM. CSW awaiting a call back.  10:25 am- CSW received a return call from pt's son about discharge planning. Patient's son inquired about patient being discharged home. He explained that pt lives with daughter and niece helps out which would provide patient with 24 hour care. He would like HH as well.CSW alerted MD, RN and RN CM of discharge plans.  TOC team will continue to assist with discharge planning needs.    Expected Discharge Plan: Skilled Nursing Facility Barriers to Discharge: SNF Pending bed offer,Insurance Authorization (covid test needed before d/c to SNF)  Expected Discharge Plan and Services Expected Discharge Plan: Skilled Nursing Facility In-house Referral: Clinical Social Work                                             Social Determinants of Health (SDOH) Interventions    Readmission Risk Interventions No flowsheet data found.

## 2020-04-27 NOTE — TOC Transition Note (Signed)
Transition of Care [facility]) - CM/SW Discharge Note   Patient Details  Name: Elijah [name] MRN: [id number] Date of Birth: 1933/04/28  Transition of Care [facility]) CM/SW Contact:  Norvel Richards, RN Phone Number: 04/27/2020, 1:55 PM   Clinical Narrative:  [facility] team for discharge planning. Spoke to Amada Jupiter- son who is not in agreement with the current discharge plan for SNF. Family requests home with home health services. He shares there will be family in the home around the clock to care for patient.  Home Health PT and OT arranged for pt. Spoke to [phone]. Arranged for P and OT services. DME-  Rolling walker arranged through [place]- [phone][facility]. No barriers noted to discharge home with home health and DME.    Final next level of care: Home w Home Health Services Barriers to Discharge: No Barriers Identified   Patient Goals and CMS Choice Patient states their goals for this hospitalization and ongoing recovery are:: Family wants patient to return home with home health. CMS Medicare.gov Compare Post Acute Care list provided to:: Patient Represenative (must comment) Choice offered to / list presented to : Adult Children  Discharge Placement                       Discharge Plan and Services In-house Referral: Clinical Social Work              DME Arranged: 3-N-1 DME Agency: [employer] Date DME Agency Contacted: 04/27/20 Time DME Agency Contacted: 1120 Representative spoke with at DME Agency: Darral Dash- [phone] [facility] Arranged: PT,OT HH Agency: [facility] Date [facility] Agency Contacted: 04/27/20 Time HH Agency Contacted: [phone] Representative spoke with at [facility] Agency: Kandee [phone]  Social Determinants of Health (SDOH) Interventions     Readmission Risk Interventions No flowsheet data found.

## 2020-04-27 NOTE — Progress Notes (Signed)
Order received to discharge patient.  Telemetry monitor removed and CCMD notified.  PIV access removed.  Discharge instructions, follow up, medications and instructions for their use discussed with patient. 

## 2020-04-27 NOTE — Discharge Summary (Addendum)
[facility] Discharge Summary   Patient: Elijah [name] ZOX:[id number]  PCP: Jaclyn Shaggy, MD  Date of admission: 04/18/2020   Date of discharge: 04/27/2020     Discharge Diagnoses:  Principal diagnosis Liver cirrhosis with variceal bleeding Active Problems:   GIB (gastrointestinal bleeding) Ascites Acute blood loss anemia Non-STEMI Type II DM, controlled without any insulin dependence BPH  Admitted From: Home Disposition:  Home with home health, patient family refused SNF, insurance refused CIR  Recommendations for Outpatient Follow-up:  1. PCP: Follow-up with PCP in 1 week.  Follow-up with IR as recommended. 2. Follow-up with gastroenterology at [facility] clinic. 3. Follow-up with cardiology 4. Follow up LABS/TEST: Repeat BMP in 1 week.   Follow-up Information    [facility] Follow up.   Why: 3 month follow up appointment needed. A scheduler from our office will call you to arrange a date/time.  Contact information: [address] [place] [address] [phone]        Jaclyn Shaggy, MD. Schedule an appointment as soon as possible for a visit in 1 week(s).   Specialty: Internal Medicine Contact information: [address]   [place] [address] [phone]        Alwyn Pea, MD Follow up.   Specialties: Cardiology, Internal Medicine Contact information: [address] [place] [address] [phone]        Regis Bill, MD Follow up.   Specialty: Gastroenterology Contact information: [address] [place] [address] [phone]              Discharge Instructions    Diet - low sodium heart healthy   Complete by: As directed    Increase activity slowly   Complete by: As directed    No wound care   Complete by: As directed       Diet recommendation: Cardiac diet  Activity: The patient is advised to gradually reintroduce usual activities, as tolerated  Discharge  Condition: stable  Code Status: DNR   History of present illness: As per the H and P dictated on admission, "Elijah [name] is a 87 y.o. male with medical history significant for CVA in 2015, MSSA bacteremia, essential hypertension, hyperlipidemia, type 2 diabetes who initially presented to [facility] from home on 04/17/2020 due to 4 days of melena, weakness, fatigue, chest pain, and shortness of breath.  Work-up revealed symptomatic blood loss anemia with initial hemoglobin of 5.7K and NSTEMI.  Admitted to the ICU.  He received 3 units PRBCs.  Currently on Protonix drip, received octreotide and Rocephin after CT angio abdomen and pelvis showed cirrhotic liver morphology with large gastric varices.  Seen by GI at [facility], had EGD done on 04/18/2020 which showed very large gastric varix without any active bleeding but with evidence of recent bleeding.  Small nonbleeding superficial varices were also noted.  He also had diagnostic paracentesis, no evidence of SBP.  Seen by cardiology felt that his uptrending troponins and EKG abnormalities were consistent with NSTEMI in the setting of acute blood loss anemia.  Recommended beta-blockade when able to tolerate.  GI recommended evaluation for TIPS or BRTO given high risk for rebleeding from the morphology of the size of the gastric varix and to transfer to [facility] for IR guided TIPS. "  Hospital Course:  Summary of his active problems in the hospital is as following.   Newly diagnosed cirrhosis of the liver with large acutely bleeding gastric varices,  resolved Concurrent ascites, improving GI and IR following: TIPS with BRTO 04/21/20 - tolerated well. Paracentesis 04/22/09 - 3.3 L, SAAG = >1 consistent with ascites due to portal hypertension CTA abdomen post procedure shows improvement in varices Continue spironolactone, Lasix, and low-dose propranolol.  Potassium was added but was discontinued at the time of the discharge given rapid improvement in the  level  Acute blood loss anemia secondary to upper GI bleed post EGD on 04/18/2020. Hemoglobin of 5.7 on presentation - stabilizing now >8 Status post 3u PRBC transfusion since admission EGD 1/28/2022showed very large gastric varix without any active bleeding but with evidence of recent bleeding. Small nonbleeding superficial varices were also noted.  Status post TIPS/BRTO, follow-up with IR. Protonix/octreotide drips discontinued Follow-up with GI.  NSTEMI Demand ischemia In the setting of marked acute blood loss anemia resolved. Presented with hemoglobin of 5.7K, baseline hemoglobin 10. Troponin has peaked at greater than 3700 and downtrending appropriately. Remains asymptomatic, cardiology discussion for preoperative clearance, they concur with previous note by Dr. Juliann Pares at [facility], given echocardiogram.  And no further intervention to modify his cardiovascular risk given his age.  Also recommends no invasive cardiac procedure given his presentation with severe GI bleed likely resultant demand ischemia.  Acute onset ascites from worsening cirrhosis IR following, likely require multiple paracentesis for symptom control during hospital stay Continue Lasix, spironolactone, propranolol as above - appears to have appropriate volume control at this point Paracentesis 04/22/2020 successful for 3L without complication, continue to follow clinically -appears to be improving daily with diuretics  Type 2 diabetes controlled, well controlled without any complication Hemoglobin A1c 5.4. Hold off home oral antiglycemic meds  Isolated elevated AST Avoid hepatotoxic agents Hold off on statin for now  BPH Resume home Flomax  Physical debility/Ambulatory dysfunction PT was consulted. Initially CIR was recommended. Insurance refused CIR admission. Peer-to-peer was performed. SNF discharge was planned. Family preferred to get discharged home based on the conversation between the social  worker and son. Discharge home with maximal therapy.  Patient was seen by physical therapy, who recommended CIR, insurance refused CIR.  Patient and family decided to go home instead of SNF which was a second recommendation.  Home health was arranged  On the day of the discharge the patient's vitals were stable, and no other acute medical condition were reported by patient. The patient was felt safe to be discharge at Home with Home health.  Consultants: Gastroenterology, IR, cardiology Procedures: IR guided paracentesis Coil assisted balloon occluder transvenous obliteration of massive gastric varices  DISCHARGE MEDICATION: Allergies as of 04/27/2020   No Known Allergies     Medication List    STOP taking these medications   atorvastatin 40 MG tablet Commonly known as: LIPITOR   cefTRIAXone 2 g in sodium chloride 0.9 % 100 mL   glimepiride 4 MG tablet Commonly known as: AMARYL   metFORMIN 500 MG tablet Commonly known as: GLUCOPHAGE   octreotide 500 mcg in sodium chloride 0.9 % 250 mL   pantoprazole 40 MG injection Commonly known as: PROTONIX Replaced by: pantoprazole 40 MG tablet   pioglitazone 30 MG tablet Commonly known as: ACTOS     TAKE these medications   furosemide 40 MG tablet Commonly known as: LASIX Take 1 tablet (40 mg total) by mouth 2 (two) times daily.   pantoprazole 40 MG tablet Commonly known as: PROTONIX Take 1 tablet (40 mg total) by mouth 2 (two) times daily. Replaces: pantoprazole 40 MG injection   propranolol 10 MG  tablet Commonly known as: INDERAL Take 1 tablet (10 mg total) by mouth 2 (two) times daily.   spironolactone 50 MG tablet Commonly known as: ALDACTONE Take 1 tablet (50 mg total) by mouth daily. Start taking on: April 28, 2020   tamsulosin 0.4 MG Caps capsule Commonly known as: FLOMAX Take 0.4 mg by mouth.            Durable Medical Equipment  (From admission, onward)         Start     Ordered   04/27/20 1037   For home use only DME 3 n 1  Once        04/27/20 1036   04/27/20 1037  For home use only DME Tub bench  Once        04/27/20 1036          Discharge Exam: Filed Weights   04/25/20 0625 04/25/20 2313 04/27/20 0439  Weight: 90.3 kg 90 kg 93.4 kg   Vitals:   04/27/20 0439 04/27/20 0738  BP: 107/62 104/61  Pulse: 76 66  Resp: 19 18  Temp: 97.9 F (36.6 C) 97.8 F (36.6 C)  SpO2: 96% 96%   General: Appear in no distress, no Rash; Oral Mucosa Clear, moist. no Abnormal Neck Mass Or lumps, Conjunctiva normal  Cardiovascular: S1 and S2 Present, aortic systolic Murmur Respiratory: good respiratory effort, Bilateral Air entry present and CTA, no Crackles, no wheezes Abdomen: Bowel Sound present, Soft and no tenderness Extremities: no Pedal edema Neurology: alert and oriented to time, place, and person affect appropriate. no new focal deficit  The results of significant diagnostics from this hospitalization (including imaging, microbiology, ancillary and laboratory) are listed below for reference.    Significant Diagnostic Studies: DG Chest 2 View  Result Date: 04/17/2020 CLINICAL DATA:  Patient with weakness. EXAM: CHEST - 2 VIEW COMPARISON:  Chest radiograph 09/11/2013. FINDINGS: Stable cardiomegaly. Large layering left pleural effusion with underlying consolidation. No pneumothorax. Thoracic spine degenerative changes. IMPRESSION: Large layering left pleural effusion with underlying consolidation. Electronically Signed   By: Annia Belt M.D.   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 He presents today for balloon occluded transvenous obliteration of gastric varices. EXAM: 1. Ultrasound-guided access right  internal jugular vein 2. Catheterization and venogram, left renal vein 3. Catheterization and venogram of gastro renal shunt 4. Catheterization and venogram of left inferior phrenic vein 5. Coil embolization of left inferior phrenic vein 6. Catheterization of venogram of accessory left inferior phrenic vein 7. Coil embolization of accessory left inferior phrenic vein 8. Embolization of gastric varices and gastro renal shunt MEDICATIONS: None. ANESTHESIA/SEDATION: 2 mg Versed and 100 mcg fentanyl were administered intravenously. Moderate Sedation Time:  64 minutes The patient was continuously monitored during the procedure by the interventional radiology nurse under my direct supervision. CONTRAST:  120 mL Omnipaque 350 FLUOROSCOPY TIME:  Fluoroscopy Time: 40 minutes 30 seconds (1472 mGy). COMPLICATIONS: None immediate. PROCEDURE: Informed written consent was obtained from the patient after a thorough discussion of the procedural risks, benefits and alternatives. All questions were addressed. Maximal Sterile Barrier Technique was utilized including caps, mask, sterile gowns, sterile gloves, sterile drape, hand hygiene and skin antiseptic. A timeout was performed prior to the initiation of the procedure. The right  internal jugular vein was interrogated with ultrasound and found to be widely patent. An image was obtained and stored for the medical record. Local anesthesia was attained by infiltration with 1% lidocaine. A small dermatotomy was made. Under real-time sonographic guidance, the vessel was punctured with a 21 gauge micropuncture needle. Using standard technique, the initial micro needle was exchanged over a 0.018 micro wire for a transitional 4 [place] micro sheath. The micro sheath was then exchanged over a 0.035 wire for a 10 French fascial dilator and the soft tissue tract was dilated. Next, a 10 French tips sheath was carefully advanced over the wire and into the inferior vena cava. An angled 5 French  catheter was then advanced over the wire. The catheter was used to select the left renal vein. A left renal venogram was performed. Standard venous anatomy. No evidence of left renal venous thrombus. A rose in wire was parked in an inferior branch of the left renal vein. The tips sheath was then advanced into the origin of the renal vein. Additional left renal venography was performed. The inflow of the gastro renal shunt was successfully identified along the superior margin of the left renal vein. The angled catheter was introduced through the tips sheath coaxially adjacent to the stabilizing Rosen wire. Utilizing a Glidewire, the catheter was successfully advanced into proximal left gastro renal shunt. Venography was again performed. The renal shunt is robust. The expected web near the origin was identified. Flow direction is toward the renal vein. The Glidewire and catheter were advanced deeper into the gastro renal shunt. The glidewire was exchanged for a superstiff Amplatz wire. The 5 French catheter was removed. Next, attempts were made to advance several occlusion balloons into the gastro renal shunt. Ultimately, a 42 French Merci balloon occlusion catheter was successfully advanced into the gastro renal shunt. The occlusion balloon was inflated and pulled snug against the web near the gastro renal shunt origin. Balloon occluded venography was then performed. The gastro renal shunt is quite massive. At least 2 large draining veins are identified escaping the gastro renal shunt and draining into the systemic system. These appear to represent the inferior phrenic vein and a second accessory inferior phrenic vein. Coil embolization of these non bleeding vessels will likely be required in order to obtain stasis within the recently hemorrhaged gastric varices. A penumbra lantern microcatheter was advanced coaxially through the balloon occlusion catheter over a 0.016 fathom wire. The catheter was successfully used  to select the inferior phrenic vein. The catheter was advanced into the inferior phrenic vein and venography was performed confirming catheter location. The inferior phrenic vein does drain into the systemic symptom and provide an outlet from the gastro renal shunt. Coil embolization was then performed using a series of 3 and 4 mm soft detachable Ruby coils. The microcatheter was brought back into the main body of the gastro renal shunt. The catheter was carefully advanced deeper into the gastro renal shunt and then used to successfully catheterize the next major escape vessel. The catheter was advanced into the vessel and venography performed. This appears to be an accessory inferior phrenic vein which drains into the azygos and hemi azygous system. This vessel must be sacrificed in order to proceed with obliteration of the target gastric varices. Therefore, coil embolization was performed utilizing a combination of 3, 4 and 5 mm Ruby detachable coils. The microcatheter was then brought back into the gastro renal shunt. Additional venography was performed, this time with carbon dioxide gas.  The carbon dioxide gas successfully opacifies the entirety of the gastric variceal network. The afferent vein from the portal venous system appears to be the posterior gastric vein. Sclerosis and mixture was then prepared in the standard fashion (3: 2 : 1 mixture of air : 3% sodium tetradecyl Sulfate : Lipiodol). The sclerosis mixture was then instilled in aliquots through the lantern microcatheter filling the gastro renal shunt and ultimately the gastric varices. Injection was stopped when sclerosis and began to spill into the posterior gastric vein. Approximately 10 mL 3% sodium tetradecyl Sulfate and 4 mL Lipiodol were administered. The microcatheter was then brought back into the proximal gastro renal shunt just beyond the occlusion balloon. Coil embolization of the proximal gastro renal shunt was then performed utilizing  numerous 18, 16, 14 mm detachable Ruby microcoils as well as multiple liquid metal packing coils. Once a dense metal plug was successfully created, the occlusion balloon was slowly deflated under real-time fluoroscopic imaging. The sclerosing mass and coil pack were stable. No evidence of mobilization or flow. The microcatheter was removed. The occlusion balloon was brought back into the left renal vein. Left renal venography was performed confirming persistent patency of the renal vein. No evidence of complication. The sheath and occlusion balloon were removed. Hemostasis was attained with the assistance of manual pressure. IMPRESSION: 1. Successful coil embolization of non bleeding accessory portosystemic collaterals (inferior phrenic an accessory inferior phrenic veins) which was required to successfully treated the bleeding gastric varices. 2. Successful coil assisted balloon occluded transvenous obliteration (CA-BRTO) of gastric varices using liquid sclerosant and detachable coils. PLAN: 1. Maintain bed rest overnight. Clear liquids are okay. Advanced diet as tolerated beginning tomorrow if patient remains stable. 2. BRTO protocol CT scan of the abdomen on Tuesday. 3. Patient will require outpatient follow-up EGD in approximately 6 weeks to assess for new or progressive esophageal varices and to assess the treated gastric varices. 4. Follow-up with Interventional Radiology in 3 months. Signed, Sterling Big, MD, RPVI [facility] Radiology Electronically Signed   By: Malachy Moan M.D.   On: 04/20/2020 15:16   IR Angiogram Selective Each Additional Vessel  Result Date: 04/20/2020 CLINICAL DATA:  87 year old male with 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 He presents today for balloon occluded transvenous  obliteration of gastric varices. EXAM: 1. Ultrasound-guided access right internal jugular vein 2. Catheterization and venogram, left renal vein 3. Catheterization and venogram of gastro renal shunt 4. Catheterization and venogram of left inferior phrenic vein 5. Coil embolization of left inferior phrenic vein 6. Catheterization of venogram of accessory left inferior phrenic vein 7. Coil embolization of accessory left inferior phrenic vein 8. Embolization of gastric varices and gastro renal shunt MEDICATIONS: None. ANESTHESIA/SEDATION: 2 mg Versed and 100 mcg fentanyl were administered intravenously. Moderate Sedation Time:  64 minutes The patient was continuously monitored during the procedure by the interventional radiology nurse under my direct supervision. CONTRAST:  120 mL Omnipaque 350 FLUOROSCOPY TIME:  Fluoroscopy Time: 40 minutes 30 seconds (1472 mGy). COMPLICATIONS: None immediate. PROCEDURE: Informed written consent was obtained from the patient after a thorough discussion of the procedural risks, benefits and alternatives. All questions were addressed. Maximal Sterile Barrier Technique was utilized including caps, mask, sterile gowns, sterile gloves, sterile drape, hand hygiene and skin antiseptic. A timeout was performed prior to the initiation of the procedure. The right  internal jugular vein was interrogated with ultrasound and found to be widely patent. An image was obtained and stored for the medical record. Local anesthesia was attained by infiltration with 1% lidocaine. A small dermatotomy was made. Under real-time sonographic guidance, the vessel was punctured with a 21 gauge micropuncture needle. Using standard technique, the initial micro needle was exchanged over a 0.018 micro wire for a transitional 4 [place] micro sheath. The micro sheath was then exchanged over a 0.035 wire for a 10 French fascial dilator and the soft tissue tract was dilated. Next, a 10 French tips sheath was carefully  advanced over the wire and into the inferior vena cava. An angled 5 French catheter was then advanced over the wire. The catheter was used to select the left renal vein. A left renal venogram was performed. Standard venous anatomy. No evidence of left renal venous thrombus. A rose in wire was parked in an inferior branch of the left renal vein. The tips sheath was then advanced into the origin of the renal vein. Additional left renal venography was performed. The inflow of the gastro renal shunt was successfully identified along the superior margin of the left renal vein. The angled catheter was introduced through the tips sheath coaxially adjacent to the stabilizing Rosen wire. Utilizing a Glidewire, the catheter was successfully advanced into proximal left gastro renal shunt. Venography was again performed. The renal shunt is robust. The expected web near the origin was identified. Flow direction is toward the renal vein. The Glidewire and catheter were advanced deeper into the gastro renal shunt. The glidewire was exchanged for a superstiff Amplatz wire. The 5 French catheter was removed. Next, attempts were made to advance several occlusion balloons into the gastro renal shunt. Ultimately, a 23 French Merci balloon occlusion catheter was successfully advanced into the gastro renal shunt. The occlusion balloon was inflated and pulled snug against the web near the gastro renal shunt origin. Balloon occluded venography was then performed. The gastro renal shunt is quite massive. At least 2 large draining veins are identified escaping the gastro renal shunt and draining into the systemic system. These appear to represent the inferior phrenic vein and a second accessory inferior phrenic vein. Coil embolization of these non bleeding vessels will likely be required in order to obtain stasis within the recently hemorrhaged gastric varices. A penumbra lantern microcatheter was advanced coaxially through the balloon  occlusion catheter over a 0.016 fathom wire. The catheter was successfully used to select the inferior phrenic vein. The catheter was advanced into the inferior phrenic vein and venography was performed confirming catheter location. The inferior phrenic vein does drain into the systemic symptom and provide an outlet from the gastro renal shunt. Coil embolization was then performed using a series of 3 and 4 mm soft detachable Ruby coils. The microcatheter was brought back into the main body of the gastro renal shunt. The catheter was carefully advanced deeper into the gastro renal shunt and then used to successfully catheterize the next major escape vessel. The catheter was advanced into the vessel and venography performed. This appears to be an accessory inferior phrenic vein which drains into the azygos and hemi azygous system. This vessel must be sacrificed in order to proceed with obliteration of the target gastric varices. Therefore, coil embolization was performed utilizing a combination of 3, 4 and 5 mm Ruby detachable coils. The microcatheter was then brought back into the gastro renal shunt. Additional venography was performed, this time with carbon dioxide gas.  The carbon dioxide gas successfully opacifies the entirety of the gastric variceal network. The afferent vein from the portal venous system appears to be the posterior gastric vein. Sclerosis and mixture was then prepared in the standard fashion (3: 2 : 1 mixture of air : 3% sodium tetradecyl Sulfate : Lipiodol). The sclerosis mixture was then instilled in aliquots through the lantern microcatheter filling the gastro renal shunt and ultimately the gastric varices. Injection was stopped when sclerosis and began to spill into the posterior gastric vein. Approximately 10 mL 3% sodium tetradecyl Sulfate and 4 mL Lipiodol were administered. The microcatheter was then brought back into the proximal gastro renal shunt just beyond the occlusion balloon. Coil  embolization of the proximal gastro renal shunt was then performed utilizing numerous 18, 16, 14 mm detachable Ruby microcoils as well as multiple liquid metal packing coils. Once a dense metal plug was successfully created, the occlusion balloon was slowly deflated under real-time fluoroscopic imaging. The sclerosing mass and coil pack were stable. No evidence of mobilization or flow. The microcatheter was removed. The occlusion balloon was brought back into the left renal vein. Left renal venography was performed confirming persistent patency of the renal vein. No evidence of complication. The sheath and occlusion balloon were removed. Hemostasis was attained with the assistance of manual pressure. IMPRESSION: 1. Successful coil embolization of non bleeding accessory portosystemic collaterals (inferior phrenic an accessory inferior phrenic veins) which was required to successfully treated the bleeding gastric varices. 2. Successful coil assisted balloon occluded transvenous obliteration (CA-BRTO) of gastric varices using liquid sclerosant and detachable coils. PLAN: 1. Maintain bed rest overnight. Clear liquids are okay. Advanced diet as tolerated beginning tomorrow if patient remains stable. 2. BRTO protocol CT scan of the abdomen on Tuesday. 3. Patient will require outpatient follow-up EGD in approximately 6 weeks to assess for new or progressive esophageal varices and to assess the treated gastric varices. 4. Follow-up with Interventional Radiology in 3 months. Signed, Sterling Big, MD, RPVI [facility] Radiology Electronically Signed   By: Malachy Moan M.D.   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 He presents today for balloon occluded transvenous obliteration of gastric varices. EXAM: 1. Ultrasound-guided access right internal jugular vein 2. Catheterization and venogram, left renal vein 3. Catheterization and venogram of gastro renal shunt 4. Catheterization and venogram of left inferior phrenic vein 5. Coil embolization of left inferior phrenic vein 6. Catheterization of venogram of accessory left inferior phrenic vein 7. Coil embolization of accessory left inferior phrenic vein 8. Embolization of gastric varices and gastro renal shunt MEDICATIONS: None. ANESTHESIA/SEDATION: 2 mg Versed and 100 mcg fentanyl were administered intravenously. Moderate Sedation Time:  64 minutes The patient was continuously monitored during the procedure by the interventional radiology nurse under my direct supervision. CONTRAST:  120 mL Omnipaque 350 FLUOROSCOPY TIME:  Fluoroscopy Time: 40 minutes 30 seconds (1472 mGy). COMPLICATIONS: None immediate. PROCEDURE: Informed written consent was obtained from the patient after a thorough discussion of the procedural risks, benefits and alternatives. All questions were addressed. Maximal Sterile Barrier Technique was utilized including caps, mask, sterile gowns, sterile gloves, sterile drape, hand hygiene and skin antiseptic. A timeout was performed prior to the initiation of the procedure. The right  internal jugular vein was interrogated with ultrasound and found to be widely patent. An image was obtained and stored for the medical record. Local anesthesia was attained by infiltration with 1% lidocaine. A small dermatotomy was made. Under real-time sonographic guidance, the vessel was punctured with a 21 gauge micropuncture needle. Using standard technique, the initial micro needle was exchanged over a 0.018 micro wire for a transitional 4 [place] micro sheath. The micro sheath was then exchanged over a 0.035 wire for a 10 French fascial dilator and the  soft tissue tract was dilated. Next, a 10 French tips sheath was carefully advanced over the wire and into the inferior vena cava. An angled 5 French catheter was then advanced over the wire. The catheter was used to select the left renal vein. A left renal venogram was performed. Standard venous anatomy. No evidence of left renal venous thrombus. A rose in wire was parked in an inferior branch of the left renal vein. The tips sheath was then advanced into the origin of the renal vein. Additional left renal venography was performed. The inflow of the gastro renal shunt was successfully identified along the superior margin of the left renal vein. The angled catheter was introduced through the tips sheath coaxially adjacent to the stabilizing Rosen wire. Utilizing a Glidewire, the catheter was successfully advanced into proximal left gastro renal shunt. Venography was again performed. The renal shunt is robust. The expected web near the origin was identified. Flow direction is toward the renal vein. The Glidewire and catheter were advanced deeper into the gastro renal shunt. The glidewire was exchanged for a superstiff Amplatz wire. The 5 French catheter was removed. Next, attempts were made to advance several occlusion balloons into the gastro renal shunt. Ultimately, a 32 French Merci balloon occlusion catheter was successfully advanced into the gastro renal shunt. The occlusion balloon was inflated and pulled snug against the web near the gastro renal shunt origin. Balloon occluded venography was then performed. The gastro renal shunt is quite massive. At least 2 large draining veins are identified escaping the gastro renal shunt and draining into the systemic system. These appear to represent the inferior phrenic vein and a second accessory inferior phrenic vein. Coil embolization of these non bleeding vessels will likely be required in order to obtain stasis within the recently hemorrhaged gastric varices. A  penumbra lantern microcatheter was advanced coaxially through the balloon occlusion catheter over a 0.016 fathom wire. The catheter was successfully used to select the inferior phrenic vein. The catheter was advanced into the inferior phrenic vein and venography was performed confirming catheter location. The inferior phrenic vein does drain into the systemic symptom and provide an outlet from the gastro renal shunt. Coil embolization was then performed using a series of 3 and 4 mm soft detachable Ruby coils. The microcatheter was brought back into the main body of the gastro renal shunt. The catheter was carefully advanced deeper into the gastro renal shunt and then used to successfully catheterize the next major escape vessel. The catheter was advanced into the vessel and venography performed. This appears to be an accessory inferior phrenic vein which drains into the azygos and hemi azygous system. This vessel must be sacrificed in order to proceed with obliteration of the target gastric varices. Therefore, coil embolization was performed utilizing a combination of 3, 4 and 5 mm Ruby detachable coils. The microcatheter was then brought back into the gastro renal shunt. Additional venography was performed, this time with carbon dioxide gas.  The carbon dioxide gas successfully opacifies the entirety of the gastric variceal network. The afferent vein from the portal venous system appears to be the posterior gastric vein. Sclerosis and mixture was then prepared in the standard fashion (3: 2 : 1 mixture of air : 3% sodium tetradecyl Sulfate : Lipiodol). The sclerosis mixture was then instilled in aliquots through the lantern microcatheter filling the gastro renal shunt and ultimately the gastric varices. Injection was stopped when sclerosis and began to spill into the posterior gastric vein. Approximately 10 mL 3% sodium tetradecyl Sulfate and 4 mL Lipiodol were administered. The microcatheter was then brought back  into the proximal gastro renal shunt just beyond the occlusion balloon. Coil embolization of the proximal gastro renal shunt was then performed utilizing numerous 18, 16, 14 mm detachable Ruby microcoils as well as multiple liquid metal packing coils. Once a dense metal plug was successfully created, the occlusion balloon was slowly deflated under real-time fluoroscopic imaging. The sclerosing mass and coil pack were stable. No evidence of mobilization or flow. The microcatheter was removed. The occlusion balloon was brought back into the left renal vein. Left renal venography was performed confirming persistent patency of the renal vein. No evidence of complication. The sheath and occlusion balloon were removed. Hemostasis was attained with the assistance of manual pressure. IMPRESSION: 1. Successful coil embolization of non bleeding accessory portosystemic collaterals (inferior phrenic an accessory inferior phrenic veins) which was required to successfully treated the bleeding gastric varices. 2. Successful coil assisted balloon occluded transvenous obliteration (CA-BRTO) of gastric varices using liquid sclerosant and detachable coils. PLAN: 1. Maintain bed rest overnight. Clear liquids are okay. Advanced diet as tolerated beginning tomorrow if patient remains stable. 2. BRTO protocol CT scan of the abdomen on Tuesday. 3. Patient will require outpatient follow-up EGD in approximately 6 weeks to assess for new or progressive esophageal varices and to assess the treated gastric varices. 4. Follow-up with Interventional Radiology in 3 months. Signed, Sterling Big, MD, RPVI [facility] Radiology Electronically Signed   By: Malachy Moan M.D.   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 He presents today for balloon occluded transvenous obliteration of gastric varices. EXAM: 1. Ultrasound-guided access right internal jugular vein 2. Catheterization and venogram, left renal vein 3. Catheterization and venogram of gastro renal shunt 4. Catheterization and venogram of left inferior phrenic vein 5. Coil embolization of left inferior phrenic vein 6. Catheterization of venogram of accessory left inferior phrenic vein 7. Coil embolization of accessory left inferior phrenic vein 8. Embolization of gastric varices and gastro renal shunt MEDICATIONS: None. ANESTHESIA/SEDATION: 2 mg Versed and 100 mcg fentanyl were administered intravenously. Moderate Sedation Time:  64 minutes The patient was continuously monitored during the procedure by the interventional radiology nurse under my direct supervision. CONTRAST:  120 mL Omnipaque 350 FLUOROSCOPY TIME:  Fluoroscopy Time: 40 minutes 30 seconds (1472 mGy). COMPLICATIONS: None immediate. PROCEDURE: Informed written consent was obtained from the patient after a thorough discussion of the procedural risks, benefits and alternatives. All questions were addressed. Maximal Sterile Barrier Technique was utilized including caps, mask, sterile gowns, sterile gloves, sterile drape, hand hygiene and skin antiseptic. A timeout was performed prior to the initiation of the procedure. The right internal  jugular vein was interrogated with ultrasound and found to be widely patent. An image was obtained and stored for the medical record. Local anesthesia was attained by infiltration with 1% lidocaine. A small dermatotomy was made. Under real-time sonographic guidance, the vessel was punctured with a 21 gauge micropuncture needle. Using standard technique, the initial micro needle was exchanged over a 0.018 micro wire for a transitional 4 [place] micro sheath. The micro sheath was then exchanged  over a 0.035 wire for a 10 French fascial dilator and the soft tissue tract was dilated. Next, a 10 French tips sheath was carefully advanced over the wire and into the inferior vena cava. An angled 5 French catheter was then advanced over the wire. The catheter was used to select the left renal vein. A left renal venogram was performed. Standard venous anatomy. No evidence of left renal venous thrombus. A rose in wire was parked in an inferior branch of the left renal vein. The tips sheath was then advanced into the origin of the renal vein. Additional left renal venography was performed. The inflow of the gastro renal shunt was successfully identified along the superior margin of the left renal vein. The angled catheter was introduced through the tips sheath coaxially adjacent to the stabilizing Rosen wire. Utilizing a Glidewire, the catheter was successfully advanced into proximal left gastro renal shunt. Venography was again performed. The renal shunt is robust. The expected web near the origin was identified. Flow direction is toward the renal vein. The Glidewire and catheter were advanced deeper into the gastro renal shunt. The glidewire was exchanged for a superstiff Amplatz wire. The 5 French catheter was removed. Next, attempts were made to advance several occlusion balloons into the gastro renal shunt. Ultimately, a 1 French Merci balloon occlusion catheter was successfully advanced into the gastro renal shunt. The occlusion balloon was inflated and pulled snug against the web near the gastro renal shunt origin. Balloon occluded venography was then performed. The gastro renal shunt is quite massive. At least 2 large draining veins are identified escaping the gastro renal shunt and draining into the systemic system. These appear to represent the inferior phrenic vein and a second accessory inferior phrenic vein. Coil embolization of these non bleeding vessels will likely be required in order to obtain  stasis within the recently hemorrhaged gastric varices. A penumbra lantern microcatheter was advanced coaxially through the balloon occlusion catheter over a 0.016 fathom wire. The catheter was successfully used to select the inferior phrenic vein. The catheter was advanced into the inferior phrenic vein and venography was performed confirming catheter location. The inferior phrenic vein does drain into the systemic symptom and provide an outlet from the gastro renal shunt. Coil embolization was then performed using a series of 3 and 4 mm soft detachable Ruby coils. The microcatheter was brought back into the main body of the gastro renal shunt. The catheter was carefully advanced deeper into the gastro renal shunt and then used to successfully catheterize the next major escape vessel. The catheter was advanced into the vessel and venography performed. This appears to be an accessory inferior phrenic vein which drains into the azygos and hemi azygous system. This vessel must be sacrificed in order to proceed with obliteration of the target gastric varices. Therefore, coil embolization was performed utilizing a combination of 3, 4 and 5 mm Ruby detachable coils. The microcatheter was then brought back into the gastro renal shunt. Additional venography was performed, this time with carbon dioxide gas. The  carbon dioxide gas successfully opacifies the entirety of the gastric variceal network. The afferent vein from the portal venous system appears to be the posterior gastric vein. Sclerosis and mixture was then prepared in the standard fashion (3: 2 : 1 mixture of air : 3% sodium tetradecyl Sulfate : Lipiodol). The sclerosis mixture was then instilled in aliquots through the lantern microcatheter filling the gastro renal shunt and ultimately the gastric varices. Injection was stopped when sclerosis and began to spill into the posterior gastric vein. Approximately 10 mL 3% sodium tetradecyl Sulfate and 4 mL Lipiodol  were administered. The microcatheter was then brought back into the proximal gastro renal shunt just beyond the occlusion balloon. Coil embolization of the proximal gastro renal shunt was then performed utilizing numerous 18, 16, 14 mm detachable Ruby microcoils as well as multiple liquid metal packing coils. Once a dense metal plug was successfully created, the occlusion balloon was slowly deflated under real-time fluoroscopic imaging. The sclerosing mass and coil pack were stable. No evidence of mobilization or flow. The microcatheter was removed. The occlusion balloon was brought back into the left renal vein. Left renal venography was performed confirming persistent patency of the renal vein. No evidence of complication. The sheath and occlusion balloon were removed. Hemostasis was attained with the assistance of manual pressure. IMPRESSION: 1. Successful coil embolization of non bleeding accessory portosystemic collaterals (inferior phrenic an accessory inferior phrenic veins) which was required to successfully treated the bleeding gastric varices. 2. Successful coil assisted balloon occluded transvenous obliteration (CA-BRTO) of gastric varices using liquid sclerosant and detachable coils. PLAN: 1. Maintain bed rest overnight. Clear liquids are okay. Advanced diet as tolerated beginning tomorrow if patient remains stable. 2. BRTO protocol CT scan of the abdomen on Tuesday. 3. Patient will require outpatient follow-up EGD in approximately 6 weeks to assess for new or progressive esophageal varices and to assess the treated gastric varices. 4. Follow-up with Interventional Radiology in 3 months. Signed, Sterling Big, MD, RPVI [facility] Radiology Electronically Signed   By: Malachy Moan M.D.   On: 04/20/2020 15:16   US Paracentesis  Result Date: 04/18/2020 INDICATION: Patient with newly found cirrhosis, ascites. Request is made for diagnostic  paracentesis. EXAM: ULTRASOUND GUIDED DIAGNOSTIC PARACENTESIS MEDICATIONS: 10 mL 1% lidocaine COMPLICATIONS: None immediate. PROCEDURE: Informed written consent was obtained from the patient after a discussion of the risks, benefits and alternatives to treatment. A timeout was performed prior to the initiation of the procedure. Initial ultrasound scanning demonstrates a small amount of ascites within the right lower abdominal quadrant. The right lower abdomen was prepped and draped in the usual sterile fashion. 1% lidocaine was used for local anesthesia. Following this, a 19 gauge, 7-cm, Yueh catheter was introduced. An ultrasound image was saved for documentation purposes. The paracentesis was performed. The catheter was removed and a dressing was applied. The patient tolerated the procedure well without immediate post procedural complication. FINDINGS: A total of approximately 60 mL of pale, clear fluid was removed. Samples were sent to the laboratory as requested by the clinical team. IMPRESSION: Successful ultrasound-guided paracentesis yielding 60 mL of peritoneal fluid. Read by: Loyce Dys PA-C Electronically Signed   By: Acquanetta Belling M.D.   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 He presents today for balloon occluded transvenous obliteration of gastric varices. EXAM: 1. Ultrasound-guided access right internal jugular vein 2. Catheterization and venogram, left renal vein 3. Catheterization and venogram of gastro renal shunt 4. Catheterization and venogram of left inferior phrenic vein 5. Coil embolization of left inferior phrenic vein 6. Catheterization of venogram of accessory left inferior phrenic vein 7. Coil embolization of accessory left  inferior phrenic vein 8. Embolization of gastric varices and gastro renal shunt MEDICATIONS: None. ANESTHESIA/SEDATION: 2 mg Versed and 100 mcg fentanyl were administered intravenously. Moderate Sedation Time:  64 minutes The patient was continuously monitored during the procedure by the interventional radiology nurse under my direct supervision. CONTRAST:  120 mL Omnipaque 350 FLUOROSCOPY TIME:  Fluoroscopy Time: 40 minutes 30 seconds (1472 mGy). COMPLICATIONS: None immediate. PROCEDURE: Informed written consent was obtained from the patient after a thorough discussion of the procedural risks, benefits and alternatives. All questions were addressed. Maximal Sterile Barrier Technique was utilized including caps, mask, sterile gowns, sterile gloves, sterile drape, hand hygiene and skin antiseptic. A timeout was performed prior to the initiation of the procedure. The right internal jugular vein was interrogated with ultrasound and found to be widely patent. An image was obtained and stored for the medical record. Local anesthesia was attained by infiltration with 1% lidocaine. A small dermatotomy was made. Under real-time sonographic guidance, the vessel was punctured with a 21 gauge micropuncture needle. Using standard technique, the initial micro needle was exchanged over a 0.018 micro wire for a transitional 4 [place] micro sheath. The micro sheath was then exchanged over a 0.035 wire for a 10 French fascial dilator and the soft tissue tract was dilated. Next, a 10 French tips sheath was carefully advanced over the wire and into the inferior vena cava. An angled 5 French catheter was then advanced over the wire. The catheter was used to select the left renal vein. A left renal venogram was performed. Standard venous anatomy. No evidence of left renal venous thrombus. A rose in wire was parked in an inferior branch of the left renal vein. The tips sheath was then advanced into the origin of the renal vein. Additional  left renal venography was performed. The inflow of the gastro renal shunt was successfully identified along the superior margin of the left renal vein. The angled catheter was introduced through the tips sheath coaxially adjacent to the stabilizing Rosen wire. Utilizing a Glidewire, the catheter was successfully advanced into proximal left gastro renal shunt. Venography was again performed. The renal shunt is robust. The expected web near the origin was identified. Flow direction is toward the renal vein. The Glidewire and catheter were advanced deeper into the gastro renal shunt. The glidewire was exchanged for a superstiff Amplatz wire. The 5 French catheter was removed. Next, attempts were made to advance several occlusion balloons into the gastro renal shunt. Ultimately, a 539 French Merci balloon occlusion catheter was successfully advanced into the gastro renal shunt. The occlusion balloon was inflated and pulled snug against the web near the gastro renal shunt origin. Balloon occluded venography was then performed. The gastro renal shunt is quite massive. At least 2 large draining veins are identified escaping the gastro renal shunt and draining into the systemic system. These appear to represent the inferior phrenic vein and a second accessory inferior phrenic vein. Coil embolization of these non bleeding vessels will likely be required in order to obtain stasis within the recently hemorrhaged gastric varices. A penumbra lantern  microcatheter was advanced coaxially through the balloon occlusion catheter over a 0.016 fathom wire. The catheter was successfully used to select the inferior phrenic vein. The catheter was advanced into the inferior phrenic vein and venography was performed confirming catheter location. The inferior phrenic vein does drain into the systemic symptom and provide an outlet from the gastro renal shunt. Coil embolization was then performed using a series of 3 and 4 mm soft detachable Ruby  coils. The microcatheter was brought back into the main body of the gastro renal shunt. The catheter was carefully advanced deeper into the gastro renal shunt and then used to successfully catheterize the next major escape vessel. The catheter was advanced into the vessel and venography performed. This appears to be an accessory inferior phrenic vein which drains into the azygos and hemi azygous system. This vessel must be sacrificed in order to proceed with obliteration of the target gastric varices. Therefore, coil embolization was performed utilizing a combination of 3, 4 and 5 mm Ruby detachable coils. The microcatheter was then brought back into the gastro renal shunt. Additional venography was performed, this time with carbon dioxide gas. The carbon dioxide gas successfully opacifies the entirety of the gastric variceal network. The afferent vein from the portal venous system appears to be the posterior gastric vein. Sclerosis and mixture was then prepared in the standard fashion (3: 2 : 1 mixture of air : 3% sodium tetradecyl Sulfate : Lipiodol). The sclerosis mixture was then instilled in aliquots through the lantern microcatheter filling the gastro renal shunt and ultimately the gastric varices. Injection was stopped when sclerosis and began to spill into the posterior gastric vein. Approximately 10 mL 3% sodium tetradecyl Sulfate and 4 mL Lipiodol were administered. The microcatheter was then brought back into the proximal gastro renal shunt just beyond the occlusion balloon. Coil embolization of the proximal gastro renal shunt was then performed utilizing numerous 18, 16, 14 mm detachable Ruby microcoils as well as multiple liquid metal packing coils. Once a dense metal plug was successfully created, the occlusion balloon was slowly deflated under real-time fluoroscopic imaging. The sclerosing mass and coil pack were stable. No evidence of mobilization or flow. The microcatheter was removed. The occlusion  balloon was brought back into the left renal vein. Left renal venography was performed confirming persistent patency of the renal vein. No evidence of complication. The sheath and occlusion balloon were removed. Hemostasis was attained with the assistance of manual pressure. IMPRESSION: 1. Successful coil embolization of non bleeding accessory portosystemic collaterals (inferior phrenic an accessory inferior phrenic veins) which was required to successfully treated the bleeding gastric varices. 2. Successful coil assisted balloon occluded transvenous obliteration (CA-BRTO) of gastric varices using liquid sclerosant and detachable coils. PLAN: 1. Maintain bed rest overnight. Clear liquids are okay. Advanced diet as tolerated beginning tomorrow if patient remains stable. 2. BRTO protocol CT scan of the abdomen on Tuesday. 3. Patient will require outpatient follow-up EGD in approximately 6 weeks to assess for new or progressive esophageal varices and to assess the treated gastric varices. 4. Follow-up with Interventional Radiology in 3 months. Signed, Sterling Big, MD, RPVI [facility] Electronically Signed   By: Malachy Moan M.D.   On: 04/20/2020 15:16   ECHOCARDIOGRAM COMPLETE  Result Date: 04/19/2020    ECHOCARDIOGRAM REPORT   Patient Name:   Elijah [name] Date of Exam: 04/19/2020 Medical Rec #:  [id number]        Height:  75.0 in Accession #:    [id number]       Weight:       175.0 lb Date of Birth:  02-Feb-1934        BSA:          2.074 m Patient Age:    86 years         BP:           112/58 mmHg Patient Gender: M                HR:           78 bpm. Exam Location:  Inpatient Procedure: 2D Echo Indications:    elevated troponin  History:        Patient has prior history of Echocardiogram examinations, most                 recent 09/04/2013. Arrythmias:Atrial Fibrillation.  Sonographer:    Delcie Roch Referring Phys: [id number]  Azucena Fallen  Sonographer Comments: Image acquisition challenging due to respiratory motion. IMPRESSIONS  1. Left ventricular ejection fraction, by estimation, is 55 to 60%. The left ventricle has normal function. The left ventricle demonstrates regional wall motion abnormalities (see scoring diagram/findings for description). Left ventricular diastolic parameters are consistent with Grade I diastolic dysfunction (impaired relaxation).  2. Right ventricular systolic function is normal. The right ventricular size is normal.  3. Left atrial size was mildly dilated.  4. Moderate pleural effusion in the left lateral region.  5. The mitral valve is degenerative. Moderate mitral valve regurgitation. No evidence of mitral stenosis.  6. The aortic valve is calcified. There is moderate calcification of the aortic valve. There is moderate thickening of the aortic valve. Aortic valve regurgitation is moderate. Moderate aortic valve stenosis. Aortic valve area, by VTI measures 1.17 cm.  Aortic valve mean gradient measures 16.0 mmHg. Aortic valve Vmax measures 3.03 m/s.  7. The inferior vena cava is normal in size with greater than 50% respiratory variability, suggesting right atrial pressure of 3 mmHg. FINDINGS  Left Ventricle: Left ventricular ejection fraction, by estimation, is 55 to 60%. The left ventricle has normal function. The left ventricle demonstrates regional wall motion abnormalities. The left ventricular internal cavity size was normal in size. There is no left ventricular hypertrophy. Left ventricular diastolic parameters are consistent with Grade I diastolic dysfunction (impaired relaxation).  LV Wall Scoring: The apex is akinetic. Right Ventricle: The right ventricular size is normal. No increase in right ventricular wall thickness. Right ventricular systolic function is normal. Left Atrium: Left atrial size was mildly dilated. Right Atrium: Right atrial size was normal in size. Pericardium: There is no  evidence of pericardial effusion. Mitral Valve: The mitral valve is degenerative in appearance. There is mild thickening of the mitral valve leaflet(s). There is mild calcification of the mitral valve leaflet(s). Moderate mitral valve regurgitation. No evidence of mitral valve stenosis. Tricuspid Valve: The tricuspid valve is normal in structure. Tricuspid valve regurgitation is mild . No evidence of tricuspid stenosis. Aortic Valve: The aortic valve is calcified. There is moderate calcification of the aortic valve. There is moderate thickening of the aortic valve. Aortic valve regurgitation is moderate. Moderate aortic stenosis is present. Aortic valve mean gradient measures 16.0 mmHg. Aortic valve peak gradient measures 36.8 mmHg. Aortic valve area, by VTI measures 1.17 cm. Pulmonic Valve: The pulmonic valve was normal in structure. Pulmonic valve regurgitation is not visualized. No evidence of pulmonic stenosis. Aorta: The aortic root is normal  in size and structure. Venous: The inferior vena cava is normal in size with greater than 50% respiratory variability, suggesting right atrial pressure of 3 mmHg. IAS/Shunts: No atrial level shunt detected by color flow Doppler. Additional Comments: There is a moderate pleural effusion in the left lateral region.  LEFT VENTRICLE PLAX 2D LVIDd:         5.20 cm LVIDs:         4.10 cm LV PW:         1.00 cm LV IVS:        1.00 cm LVOT diam:     1.80 cm LV SV:         65 LV SV Index:   31 LVOT Area:     2.54 cm  RIGHT VENTRICLE             IVC RV S prime:     12.50 cm/s  IVC diam: 2.10 cm TAPSE (M-mode): 1.9 cm LEFT ATRIUM             Index       RIGHT ATRIUM           Index LA diam:        4.10 cm 1.98 cm/m  RA Area:     17.30 cm LA Vol (A2C):   74.9 ml 36.12 ml/m RA Volume:   41.80 ml  20.16 ml/m LA Vol (A4C):   69.0 ml 33.28 ml/m LA Biplane Vol: 72.7 ml 35.06 ml/m  AORTIC VALVE AV Area (Vmax):    1.01 cm AV Area (Vmean):   1.08 cm AV Area (VTI):     1.17 cm AV  Vmax:           303.33 cm/s AV Vmean:          184.000 cm/s AV VTI:            0.554 m AV Peak Grad:      36.8 mmHg AV Mean Grad:      16.0 mmHg LVOT Vmax:         120.00 cm/s LVOT Vmean:        77.800 cm/s LVOT VTI:          0.255 m LVOT/AV VTI ratio: 0.46  AORTA Ao Root diam: 3.30 cm  SHUNTS Systemic VTI:  0.25 m Systemic Diam: 1.80 cm Donato Schultz MD Electronically signed by Donato Schultz MD Signature Date/Time: 04/19/2020/4:01:15 PM    Final    IR EMBO VENOUS NOT HEMORR HEMANG  INC GUIDE ROADMAPPING  Result Date: 04/20/2020 CLINICAL DATA:  87 year old male with 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 He presents today for balloon occluded transvenous obliteration of gastric varices. EXAM: 1. Ultrasound-guided access right internal jugular vein 2. Catheterization and venogram, left renal vein 3. Catheterization and venogram of gastro renal shunt 4. Catheterization and venogram of left inferior phrenic vein 5. Coil embolization of left inferior phrenic vein 6. Catheterization of venogram of accessory left inferior phrenic vein 7. Coil embolization of accessory left inferior phrenic vein 8. Embolization of gastric varices and gastro renal shunt MEDICATIONS: None. ANESTHESIA/SEDATION: 2 mg Versed and 100 mcg fentanyl were administered intravenously. Moderate Sedation Time:  64 minutes The patient was continuously monitored during the procedure by the interventional radiology nurse under my direct supervision. CONTRAST:  120 mL Omnipaque 350 FLUOROSCOPY TIME:  Fluoroscopy Time: 40 minutes 30 seconds (  1472 mGy). COMPLICATIONS: None immediate. PROCEDURE: Informed written consent was obtained from the patient after a thorough discussion of the procedural risks, benefits and alternatives. All questions were addressed. Maximal Sterile Barrier Technique was utilized including  caps, mask, sterile gowns, sterile gloves, sterile drape, hand hygiene and skin antiseptic. A timeout was performed prior to the initiation of the procedure. The right internal jugular vein was interrogated with ultrasound and found to be widely patent. An image was obtained and stored for the medical record. Local anesthesia was attained by infiltration with 1% lidocaine. A small dermatotomy was made. Under real-time sonographic guidance, the vessel was punctured with a 21 gauge micropuncture needle. Using standard technique, the initial micro needle was exchanged over a 0.018 micro wire for a transitional 4 [place] micro sheath. The micro sheath was then exchanged over a 0.035 wire for a 10 French fascial dilator and the soft tissue tract was dilated. Next, a 10 French tips sheath was carefully advanced over the wire and into the inferior vena cava. An angled 5 French catheter was then advanced over the wire. The catheter was used to select the left renal vein. A left renal venogram was performed. Standard venous anatomy. No evidence of left renal venous thrombus. A rose in wire was parked in an inferior branch of the left renal vein. The tips sheath was then advanced into the origin of the renal vein. Additional left renal venography was performed. The inflow of the gastro renal shunt was successfully identified along the superior margin of the left renal vein. The angled catheter was introduced through the tips sheath coaxially adjacent to the stabilizing Rosen wire. Utilizing a Glidewire, the catheter was successfully advanced into proximal left gastro renal shunt. Venography was again performed. The renal shunt is robust. The expected web near the origin was identified. Flow direction is toward the renal vein. The Glidewire and catheter were advanced deeper into the gastro renal shunt. The glidewire was exchanged for a superstiff Amplatz wire. The 5 French catheter was removed. Next, attempts were made to  advance several occlusion balloons into the gastro renal shunt. Ultimately, a 42 French Merci balloon occlusion catheter was successfully advanced into the gastro renal shunt. The occlusion balloon was inflated and pulled snug against the web near the gastro renal shunt origin. Balloon occluded venography was then performed. The gastro renal shunt is quite massive. At least 2 large draining veins are identified escaping the gastro renal shunt and draining into the systemic system. These appear to represent the inferior phrenic vein and a second accessory inferior phrenic vein. Coil embolization of these non bleeding vessels will likely be required in order to obtain stasis within the recently hemorrhaged gastric varices. A penumbra lantern microcatheter was advanced coaxially through the balloon occlusion catheter over a 0.016 fathom wire. The catheter was successfully used to select the inferior phrenic vein. The catheter was advanced into the inferior phrenic vein and venography was performed confirming catheter location. The inferior phrenic vein does drain into the systemic symptom and provide an outlet from the gastro renal shunt. Coil embolization was then performed using a series of 3 and 4 mm soft detachable Ruby coils. The microcatheter was brought back into the main body of the gastro renal shunt. The catheter was carefully advanced deeper into the gastro renal shunt and then used to successfully catheterize the next major escape vessel. The catheter was advanced into the vessel and venography performed. This appears to be an accessory inferior phrenic vein which  drains into the azygos and hemi azygous system. This vessel must be sacrificed in order to proceed with obliteration of the target gastric varices. Therefore, coil embolization was performed utilizing a combination of 3, 4 and 5 mm Ruby detachable coils. The microcatheter was then brought back into the gastro renal shunt. Additional venography was  performed, this time with carbon dioxide gas. The carbon dioxide gas successfully opacifies the entirety of the gastric variceal network. The afferent vein from the portal venous system appears to be the posterior gastric vein. Sclerosis and mixture was then prepared in the standard fashion (3: 2 : 1 mixture of air : 3% sodium tetradecyl Sulfate : Lipiodol). The sclerosis mixture was then instilled in aliquots through the lantern microcatheter filling the gastro renal shunt and ultimately the gastric varices. Injection was stopped when sclerosis and began to spill into the posterior gastric vein. Approximately 10 mL 3% sodium tetradecyl Sulfate and 4 mL Lipiodol were administered. The microcatheter was then brought back into the proximal gastro renal shunt just beyond the occlusion balloon. Coil embolization of the proximal gastro renal shunt was then performed utilizing numerous 18, 16, 14 mm detachable Ruby microcoils as well as multiple liquid metal packing coils. Once a dense metal plug was successfully created, the occlusion balloon was slowly deflated under real-time fluoroscopic imaging. The sclerosing mass and coil pack were stable. No evidence of mobilization or flow. The microcatheter was removed. The occlusion balloon was brought back into the left renal vein. Left renal venography was performed confirming persistent patency of the renal vein. No evidence of complication. The sheath and occlusion balloon were removed. Hemostasis was attained with the assistance of manual pressure. IMPRESSION: 1. Successful coil embolization of non bleeding accessory portosystemic collaterals (inferior phrenic an accessory inferior phrenic veins) which was required to successfully treated the bleeding gastric varices. 2. Successful coil assisted balloon occluded transvenous obliteration (CA-BRTO) of gastric varices using liquid sclerosant and detachable coils. PLAN: 1. Maintain bed rest overnight. Clear liquids are okay.  Advanced diet as tolerated beginning tomorrow if patient remains stable. 2. BRTO protocol CT scan of the abdomen on Tuesday. 3. Patient will require outpatient follow-up EGD in approximately 6 weeks to assess for new or progressive esophageal varices and to assess the treated gastric varices. 4. Follow-up with Interventional Radiology in 3 months. Signed, Sterling Big, MD, RPVI [facility] Radiology Electronically Signed   By: Malachy Moan M.D.   On: 04/20/2020 15:16   CT Angio Abd/Pel w/ and/or w/o  Result Date: 04/22/2020 CLINICAL DATA:  Cryptogenic cirrhosis, ascites, large gastric varices status post acute gastric variceal bleed. Status post BRTO EXAM: CT ANGIOGRAPHY ABDOMEN AND PELVIS WITH CONTRAST AND WITHOUT CONTRAST TECHNIQUE: Multidetector CT imaging of the abdomen and pelvis was performed using the standard protocol during bolus administration of intravenous contrast. Multiplanar reconstructed images and MIPs were obtained and reviewed to evaluate the vascular anatomy. CONTRAST:  OMNIPAQUE IOHEXOL 350 MG/ML SOLN COMPARISON:  04/17/2020 FINDINGS: VASCULAR Aorta: Negative for aneurysm or dissection. No occlusive process. Aortic atherosclerosis again noted. No retroperitoneal hemorrhage, hematoma, or evidence of rupture. Celiac: Remains patent including its branches. No evidence of active arterial bleeding in the celiac territory. SMA: Remains widely patent including its branches. No active arterial bleeding in the SMA vascular territory. Renals: Atherosclerotic origins with mild ostial narrowings but no significant renal artery stenosis appreciated by CTA. IMA: IMA remains patent off the distal aorta including its branches. Inflow: Tortuous and atherosclerotic iliac  vasculature without dissection, aneurysm, occlusive disease. No inflow disease. Proximal Outflow: Atherosclerotic changes but the common femoral, proximal profunda femoral, and  proximal superficial femoral arteries demonstrated are all patent. Veins: There are small amounts of radiopaque embolic material in the main portal vein and also in the SMV. There is also nonocclusive hypoechoic thrombus in the portal SMV confluence. These changes are best demonstrated on images 28 through 40 of series 8. The main portal vein, left and right portal veins, splenic and mesenteric veins remain patent. Hepatic veins are all patent. No other abdominopelvic veno-occlusive process. Review of the MIP images confirms the above findings. NON-VASCULAR Lower chest: Similar large left effusion with left lower lobe collapse/consolidation. Heart is enlarged. Coronary atherosclerosis noted. No pericardial effusion. Hepatobiliary: Stable cirrhotic changes of the liver without biliary dilatation or obstruction. Gallbladder is distended with some layering hyperdense material, suspect vicarious contrast excretion. Difficult to exclude gallstones. No focal hepatic abnormality. Common bile duct nondilated. Pancreas: Marked atrophy of the pancreas as before. Stable small cystic lesion of the pancreas tail. Chronic pancreas calcifications noted. No new finding. Spleen: Normal in size without focal abnormality. Adrenals/Urinary Tract: Normal right adrenal gland. Left adrenal gland is obscured by the artifact from the radiopaque BRTO embolic material. Kidneys demonstrate symmetric enhancement without obstruction or hydronephrosis. Ureters are symmetric and decompressed. Right bladder diverticula again noted posteriorly. Excreted contrast noted within the bladder. Stomach/Bowel: Negative for bowel obstruction, significant dilatation, or ileus. Scattered colonic diverticulosis. Status post interval BRTO. Gastric varices and gastro renal shunt appear occluded following the procedure. Similar degree of diffuse abdominopelvic mesenteric edema and small amount of abdominopelvic ascites. Lymphatic: No bulky adenopathy.  Reproductive: Very large bilateral inguinal hernias containing similar volume of ascites and bowel loops. No evidence of incarceration. Other: Intact abdominal wall. No ventral hernia. Similar volume of abdominopelvic ascites. Musculoskeletal: Bones are osteopenic. Degenerative changes of the spine. No acute osseous IMPRESSION: VASCULAR Negative for active arterial GI bleeding. Status post BRTO with complete occlusion of the gastric varices and the gastro renal shunt. Small amount of nonocclusive BRTO embolic material and adjacent nonocclusive venous thrombus at the portal SMV confluence as detailed above. Splenic, mesenteric, portal and hepatic veins all remain patent. NON-VASCULAR Stable large left effusion with left lower lobe collapse/consolidation Hepatic cirrhosis Similar volume of abdominopelvic ascites and mesenteric edema. Diverticulosis Large bilateral inguinal hernias without incarceration Electronically Signed   By: Judie Petit.  Shick M.D.   On: 04/22/2020 16:23   CT Angio Abd/Pel W and/or Wo Contrast  Result Date: 04/17/2020 CLINICAL DATA:  GI bleeding. EXAM: CTA ABDOMEN AND PELVIS WITHOUT AND WITH CONTRAST TECHNIQUE: Multidetector CT imaging of the abdomen and pelvis was performed using the standard protocol during bolus administration of intravenous contrast. Multiplanar reconstructed images and MIPs were obtained and reviewed to evaluate the vascular anatomy. CONTRAST:  OMNIPAQUE IOHEXOL 350 MG/ML SOLN COMPARISON:  None. FINDINGS: VASCULAR Aorta: Normal caliber aorta without aneurysm, dissection, vasculitis or significant stenosis. Atherosclerotic changes are noted throughout the abdominal aorta. Celiac: Patent without evidence of aneurysm, dissection, vasculitis or significant stenosis. SMA: Patent without evidence of aneurysm, dissection, vasculitis or significant stenosis. Renals: Both renal arteries are patent without evidence of aneurysm, dissection, vasculitis, fibromuscular dysplasia or  significant stenosis. There are 2 right renal arteries. IMA: Patent without evidence of aneurysm, dissection, vasculitis or significant stenosis. Inflow: Patent without evidence of aneurysm, dissection, vasculitis or significant stenosis. Proximal Outflow: Bilateral common femoral and visualized portions of the superficial and profunda femoral arteries are patent without evidence  of aneurysm, dissection, vasculitis or significant stenosis. Veins: No obvious venous abnormality within the limitations of this arterial phase study. Review of the MIP images confirms the above findings. NON-VASCULAR Lower chest: There is a large left-sided pleural effusion that is only partially visualized. There is at least partial collapse of the left lower lobe.The heart size is normal. The intracardiac blood pool is hypodense relative to the adjacent myocardium consistent with anemia. Hepatobiliary: The liver is cirrhotic. There are large gastric varices with a gastro renal shunt. The portal vein is patent. There are esophageal varices. Normal gallbladder.There is no biliary ductal dilation. Pancreas: The pancreas is atrophic. Again noted is a low-attenuation, cystic appearing nodule at the pancreatic tail which is not substantially changed since 2015 is likely a benign process. Spleen: Unremarkable. Adrenals/Urinary Tract: --Adrenal glands: Unremarkable. --Right kidney/ureter: No hydronephrosis or radiopaque kidney stones. --Left kidney/ureter: No hydronephrosis or radiopaque kidney stones. --Urinary bladder: The urinary bladder is significantly distended. Stomach/Bowel: --Stomach/Duodenum: Large gastric varices are noted. --Small bowel: Unremarkable. --Colon: Rectosigmoid diverticulosis without acute inflammation. --Appendix: Normal. Lymphatic: --No retroperitoneal lymphadenopathy. --No mesenteric lymphadenopathy. --No pelvic or inguinal lymphadenopathy. Reproductive: There are large bilateral inguinal hernias. On the left the  inguinal hernia contains a large amount of ascites and portions of the sigmoid colon without evidence for obstruction. On the right, the hernia contains mostly fluid but a small portion of small bowel as well without evidence for obstruction. Other: There is a moderate to large volume of ascites in the abdomen. Musculoskeletal. Multilevel degenerative changes are noted throughout the lumbar spine. IMPRESSION: 1. No evidence for active arterial GI bleeding. 2. Cirrhosis with stigmata of portal hypertension including a moderate volume ascites and large gastric varices with a gastrorenal shunt. Small esophageal varices are also noted. The portal vein is patent. Splenic vein is patent. 3. Large partially visualized left-sided pleural effusion with collapse of the left lower lobe. 4. Very large bilateral inguinal hernias containing a large volume of ascites and bowel as detailed above. Aortic Atherosclerosis (ICD10-I70.0). Electronically Signed   By: Katherine Mantle M.D.   On: 04/17/2020 18:38   IR Paracentesis  Result Date: 04/21/2020 INDICATION: Patient with a history of hepatic cirrhosis, portal hypertension and gastric varices. Patient is status post BRTO April 20, 2020. Patient presents today for a therapeutic and diagnostic paracentesis. EXAM: ULTRASOUND GUIDED PARACENTESIS MEDICATIONS: 1% lidocaine 10 mL COMPLICATIONS: None immediate. PROCEDURE: Informed written consent was obtained from the patient after a discussion of the risks, benefits and alternatives to treatment. A timeout was performed prior to the initiation of the procedure. Initial ultrasound scanning demonstrates a large amount of ascites within the right lower abdominal quadrant. The right lower abdomen was prepped and draped in the usual sterile fashion. 1% lidocaine was used for local anesthesia. Following this, a 19 gauge, 7-cm, Yueh catheter was introduced. An ultrasound image was saved for documentation purposes. The paracentesis was  performed. The catheter was removed and a dressing was applied. The patient tolerated the procedure well without immediate post procedural complication. FINDINGS: A total of approximately 3.3 L of clear yellow fluid was removed. Samples were sent to the laboratory as requested by the clinical team. IMPRESSION: Successful ultrasound-guided paracentesis yielding 3.3 liters of peritoneal fluid. Read by: Alwyn Ren, NP Electronically Signed   By: Marliss Coots MD   On: 04/21/2020 14:07    Microbiology: Recent Results (from the past 240 hour(s))  Aerobic/Anaerobic Culture (surgical/deep wound)     Status: None   Collection Time: 04/18/20  3:33 PM   Specimen: PATH Cytology Peritoneal fluid  Result Value Ref Range Status   Specimen Description   Final    PERITONEAL Performed at [facility], [address] [place], [place] [address]    Special Requests   Final    PERITONEAL Performed at [facility], [address] [place], [place] [address]    Gram Stain   Final    FEW WBC PRESENT, PREDOMINANTLY MONONUCLEAR NO ORGANISMS SEEN    Culture   Final    No growth aerobically or anaerobically. Performed at [facility], [address] [place], [place] [address]    Report Status 04/24/2020 FINAL  Final     Labs: CBC: Recent Labs  Lab 04/23/20 0126 04/24/20 0130 04/25/20 0214 04/26/20 0128 04/27/20 0235  WBC 9.9 9.2 10.5 9.8 9.3  HGB 8.2* 8.3* 8.8* 8.8* 8.9*  HCT 24.8* 24.6* 27.4* 25.8* 26.5*  MCV 98.4 97.2 99.3 97.0 96.0  PLT 95* 105* 130* 137* 144*   Basic Metabolic Panel: Recent Labs  Lab 04/23/20 0126 04/24/20 0130 04/25/20 0214 04/26/20 0128 04/27/20 0235  NA 134* 136 137 135 136  K 3.5 3.3* 3.7 3.2* 4.0  CL 107 108 104 102 105  CO2 20* 21* 24 23 23   GLUCOSE 202* 184* 177* 208* 196*  BUN 22 19 17 21 21   CREATININE 0.97 1.12 1.15 1.19 1.20  CALCIUM 7.7* 7.4* 7.6* 7.3* 7.5*   Liver Function Tests: Recent Labs  Lab 04/23/20 0126  04/24/20 0130 04/25/20 0214 04/26/20 0128 04/27/20 0235  AST 32 30 41 37 63*  ALT 25 25 27 30  40  ALKPHOS 99 104 135* 162* 193*  BILITOT 0.7 0.8 0.9 1.1 1.3*  PROT 5.4* 5.3* 5.3* 5.3* 5.2*  ALBUMIN 2.2* 2.0* 1.9* 1.8* 1.7*   CBG: No results for input(s): GLUCAP in the last 168 hours.  Time spent: 35 minutes  Signed:  Lynden Oxford  [facility] 04/27/2020 7:43 PM

## 2020-05-13 ENCOUNTER — Encounter: Payer: Self-pay | Admitting: Emergency Medicine

## 2020-05-13 ENCOUNTER — Emergency Department: Payer: Medicare Other

## 2020-05-13 ENCOUNTER — Other Ambulatory Visit: Payer: Self-pay

## 2020-05-13 ENCOUNTER — Inpatient Hospital Stay
Admission: EM | Admit: 2020-05-13 | Discharge: 2020-05-17 | DRG: 432 | Disposition: A | Discharge status: Home / Self Care | Payer: Medicare Other | Attending: Internal Medicine | Admitting: Internal Medicine

## 2020-05-13 DIAGNOSIS — L8990 Pressure ulcer of unspecified site, unspecified stage: Secondary | ICD-10-CM | POA: Insufficient documentation

## 2020-05-13 DIAGNOSIS — K7290 Hepatic failure, unspecified without coma: Secondary | ICD-10-CM | POA: Diagnosis present

## 2020-05-13 DIAGNOSIS — Z66 Do not resuscitate: Secondary | ICD-10-CM | POA: Diagnosis present

## 2020-05-13 DIAGNOSIS — K7200 Acute and subacute hepatic failure without coma: Secondary | ICD-10-CM | POA: Diagnosis present

## 2020-05-13 DIAGNOSIS — L89311 Pressure ulcer of right buttock, stage 1: Secondary | ICD-10-CM | POA: Diagnosis present

## 2020-05-13 DIAGNOSIS — Z794 Long term (current) use of insulin: Secondary | ICD-10-CM

## 2020-05-13 DIAGNOSIS — L89321 Pressure ulcer of left buttock, stage 1: Secondary | ICD-10-CM | POA: Diagnosis present

## 2020-05-13 DIAGNOSIS — Y92239 Unspecified place in hospital as the place of occurrence of the external cause: Secondary | ICD-10-CM | POA: Diagnosis not present

## 2020-05-13 DIAGNOSIS — R188 Other ascites: Secondary | ICD-10-CM

## 2020-05-13 DIAGNOSIS — N401 Enlarged prostate with lower urinary tract symptoms: Secondary | ICD-10-CM

## 2020-05-13 DIAGNOSIS — E119 Type 2 diabetes mellitus without complications: Secondary | ICD-10-CM

## 2020-05-13 DIAGNOSIS — Z79899 Other long term (current) drug therapy: Secondary | ICD-10-CM

## 2020-05-13 DIAGNOSIS — E876 Hypokalemia: Secondary | ICD-10-CM | POA: Diagnosis not present

## 2020-05-13 DIAGNOSIS — K7682 Hepatic encephalopathy: Secondary | ICD-10-CM | POA: Diagnosis present

## 2020-05-13 DIAGNOSIS — T502X5A Adverse effect of carbonic-anhydrase inhibitors, benzothiadiazides and other diuretics, initial encounter: Secondary | ICD-10-CM | POA: Diagnosis not present

## 2020-05-13 DIAGNOSIS — K7460 Unspecified cirrhosis of liver: Secondary | ICD-10-CM | POA: Diagnosis not present

## 2020-05-13 DIAGNOSIS — R338 Other retention of urine: Secondary | ICD-10-CM

## 2020-05-13 DIAGNOSIS — Z8673 Personal history of transient ischemic attack (TIA), and cerebral infarction without residual deficits: Secondary | ICD-10-CM

## 2020-05-13 DIAGNOSIS — I2510 Atherosclerotic heart disease of native coronary artery without angina pectoris: Secondary | ICD-10-CM | POA: Diagnosis present

## 2020-05-13 DIAGNOSIS — Z20822 Contact with and (suspected) exposure to covid-19: Secondary | ICD-10-CM | POA: Diagnosis present

## 2020-05-13 DIAGNOSIS — I252 Old myocardial infarction: Secondary | ICD-10-CM

## 2020-05-13 DIAGNOSIS — E1165 Type 2 diabetes mellitus with hyperglycemia: Secondary | ICD-10-CM | POA: Diagnosis present

## 2020-05-13 HISTORY — DX: Atherosclerotic heart disease of native coronary artery without angina pectoris: I25.10

## 2020-05-13 LAB — AMMONIA: Ammonia: 72 umol/L — ABNORMAL HIGH (ref 9–35)

## 2020-05-13 LAB — BASIC METABOLIC PANEL
Anion gap: 8 (ref 5–15)
BUN: 19 mg/dL (ref 8–23)
CO2: 27 mmol/L (ref 22–32)
Calcium: 8.4 mg/dL — ABNORMAL LOW (ref 8.9–10.3)
Chloride: 101 mmol/L (ref 98–111)
Creatinine, Ser: 1.25 mg/dL — ABNORMAL HIGH (ref 0.61–1.24)
GFR, Estimated: 57 mL/min — ABNORMAL LOW (ref >60)
Glucose, Bld: 194 mg/dL — ABNORMAL HIGH (ref 70–99)
Potassium: 3.3 mmol/L — ABNORMAL LOW (ref 3.5–5.1)
Sodium: 136 mmol/L (ref 135–145)

## 2020-05-13 LAB — CBC
HCT: 27.9 % — ABNORMAL LOW (ref 39.0–52.0)
Hemoglobin: 9.2 g/dL — ABNORMAL LOW (ref 13.0–17.0)
MCH: 31.9 pg (ref 26.0–34.0)
MCHC: 33 g/dL (ref 30.0–36.0)
MCV: 96.9 fL (ref 80.0–100.0)
Platelets: 169 10*3/uL (ref 150–400)
RBC: 2.88 MIL/uL — ABNORMAL LOW (ref 4.22–5.81)
RDW: 18.3 % — ABNORMAL HIGH (ref 11.5–15.5)
WBC: 6.8 10*3/uL (ref 4.0–10.5)
nRBC: 0 % (ref 0.0–0.2)

## 2020-05-13 LAB — PROTIME-INR
INR: 1.2 (ref 0.8–1.2)
Prothrombin Time: 14.3 seconds (ref 11.4–15.2)

## 2020-05-13 LAB — HEPATIC FUNCTION PANEL
ALT: 44 U/L (ref 0–44)
AST: 46 U/L — ABNORMAL HIGH (ref 15–41)
Albumin: 2.1 g/dL — ABNORMAL LOW (ref 3.5–5.0)
Alkaline Phosphatase: 293 U/L — ABNORMAL HIGH (ref 38–126)
Bilirubin, Direct: 0.3 mg/dL — ABNORMAL HIGH (ref 0.0–0.2)
Indirect Bilirubin: 1 mg/dL — ABNORMAL HIGH (ref 0.3–0.9)
Total Bilirubin: 1.3 mg/dL — ABNORMAL HIGH (ref 0.3–1.2)
Total Protein: 6.5 g/dL (ref 6.5–8.1)

## 2020-05-13 LAB — TROPONIN I (HIGH SENSITIVITY)
Troponin I (High Sensitivity): 28 ng/L — ABNORMAL HIGH (ref <18)
Troponin I (High Sensitivity): 29 ng/L — ABNORMAL HIGH (ref <18)

## 2020-05-13 LAB — URINALYSIS, COMPLETE (UACMP) WITH MICROSCOPIC
Bacteria, UA: NONE SEEN
Bilirubin Urine: NEGATIVE
Glucose, UA: 50 mg/dL — AB
Ketones, ur: NEGATIVE mg/dL
Leukocytes,Ua: NEGATIVE
Nitrite: NEGATIVE
Protein, ur: NEGATIVE mg/dL
Specific Gravity, Urine: 1.014 (ref 1.005–1.030)
Squamous Epithelial / HPF: NONE SEEN (ref 0–5)
pH: 6 (ref 5.0–8.0)

## 2020-05-13 LAB — RESP PANEL BY RT-PCR (FLU A&B, COVID) ARPGX2
Influenza A by PCR: NEGATIVE
Influenza B by PCR: NEGATIVE
SARS Coronavirus 2 by RT PCR: NEGATIVE

## 2020-05-13 LAB — GLUCOSE, CAPILLARY: Glucose-Capillary: 172 mg/dL — ABNORMAL HIGH (ref 70–99)

## 2020-05-13 LAB — APTT: aPTT: 33 seconds (ref 24–36)

## 2020-05-13 MED ORDER — PROPRANOLOL HCL 10 MG PO TABS
10.0000 mg | ORAL_TABLET | Freq: Two times a day (BID) | ORAL | Status: DC
  Administered 2020-05-13 – 2020-05-15 (×5): 10 mg via ORAL
  Filled 2020-05-13 (×10): qty 1

## 2020-05-13 MED ORDER — SPIRONOLACTONE 25 MG PO TABS
50.0000 mg | ORAL_TABLET | Freq: Every day | ORAL | Status: DC
  Administered 2020-05-14 – 2020-05-17 (×4): 50 mg via ORAL
  Filled 2020-05-13 (×5): qty 2

## 2020-05-13 MED ORDER — SODIUM CHLORIDE 0.9 % IV SOLN: 250.0000 mL | INTRAVENOUS | Status: DC | PRN

## 2020-05-13 MED ORDER — PANTOPRAZOLE SODIUM 40 MG PO TBEC
40.0000 mg | DELAYED_RELEASE_TABLET | Freq: Two times a day (BID) | ORAL | Status: DC
  Administered 2020-05-13 – 2020-05-17 (×9): 40 mg via ORAL
  Filled 2020-05-13 (×9): qty 1

## 2020-05-13 MED ORDER — ENOXAPARIN SODIUM 40 MG/0.4ML ~~LOC~~ SOLN: 40.0000 mg | SUBCUTANEOUS | Status: DC

## 2020-05-13 MED ORDER — LACTULOSE 10 GM/15ML PO SOLN
30.0000 g | Freq: Three times a day (TID) | ORAL | Status: DC
  Administered 2020-05-13 (×2): 30 g via ORAL
  Filled 2020-05-13 (×2): qty 60

## 2020-05-13 MED ORDER — TAMSULOSIN HCL 0.4 MG PO CAPS
0.4000 mg | ORAL_CAPSULE | Freq: Every day | ORAL | Status: DC
  Administered 2020-05-13 – 2020-05-17 (×5): 0.4 mg via ORAL
  Filled 2020-05-13 (×5): qty 1

## 2020-05-13 MED ORDER — FUROSEMIDE 40 MG PO TABS
40.0000 mg | ORAL_TABLET | Freq: Two times a day (BID) | ORAL | Status: DC
  Administered 2020-05-13 – 2020-05-17 (×7): 40 mg via ORAL
  Filled 2020-05-13 (×7): qty 1

## 2020-05-13 MED ORDER — SODIUM CHLORIDE 0.9% FLUSH
3.0000 mL | Freq: Two times a day (BID) | INTRAVENOUS | Status: DC
  Administered 2020-05-13 – 2020-05-16 (×8): 3 mL via INTRAVENOUS

## 2020-05-13 MED ORDER — ONDANSETRON HCL 4 MG PO TABS: 4.0000 mg | ORAL_TABLET | Freq: Four times a day (QID) | ORAL | Status: DC | PRN

## 2020-05-13 MED ORDER — POTASSIUM CHLORIDE CRYS ER 20 MEQ PO TBCR
40.0000 meq | EXTENDED_RELEASE_TABLET | Freq: Once | ORAL | Status: AC
  Administered 2020-05-13: 40 meq via ORAL
  Filled 2020-05-13: qty 2

## 2020-05-13 MED ORDER — SODIUM CHLORIDE 0.9% FLUSH: 3.0000 mL | INTRAVENOUS | Status: DC | PRN

## 2020-05-13 MED ORDER — ONDANSETRON HCL 4 MG/2ML IJ SOLN: 4.0000 mg | Freq: Four times a day (QID) | INTRAMUSCULAR | Status: DC | PRN

## 2020-05-13 NOTE — ED Provider Notes (Signed)
[facility] Emergency Department Provider Note  ____________________________________________   Event Date/Time   First MD Initiated Contact with Patient 05/13/20 1150     (approximate)  I have reviewed the triage vital signs and the nursing notes.   HISTORY  Chief Complaint Weakness and Altered Mental Status    HPI Elijah [name] is a 87 y.o. male with diabetes who comes in with altered mental status. Pt is AO x3 but does not know why he is here.    Patient is had recent admissions due to upper GI bleed on review of records.  He underwent TIPS procedure at [facility].  I called son.  4-5 days he is progression worsening confusion. Thought maybe from a UTI.  Sent UA to doctor and it was normal. He was able to walk without assistance prior to hospital stay but now using a walker.  I asked son if he is always had swelling around his testicles making it difficult for him to urinate.  Hollenberg said it was just fluid back up and that's what is causing the swelling around his testicles.  They have noticed that he is having a hard time urinating.  No known falls at home.          Past Medical History:  Diagnosis Date  . Diabetes mellitus without complication [facility](HCC)     Patient Active Problem List   Diagnosis Date Noted  . NSTEMI (non-ST elevated myocardial infarction) (HCC) 04/18/2020  . Blood loss anemia 04/18/2020  . Cirrhosis of liver (HCC) 04/18/2020  . GIB (gastrointestinal bleeding) 04/17/2020  . CVA (cerebral infarction) 09/11/2013    Past Surgical History:  Procedure Laterality Date  . ESOPHAGOGASTRODUODENOSCOPY (EGD) WITH PROPOFOL N/A 04/18/2020   Procedure: ESOPHAGOGASTRODUODENOSCOPY (EGD) WITH PROPOFOL;  Surgeon: Regis BillLocklear, Cameron T, MD;  Location: [facility] ENDOSCOPY;  Service: Endoscopy;  Laterality: N/A;  . IR ANGIOGRAM SELECTIVE EACH ADDITIONAL VESSEL  04/20/2020  . IR ANGIOGRAM SELECTIVE EACH ADDITIONAL VESSEL  04/20/2020  . IR  ANGIOGRAM SELECTIVE EACH ADDITIONAL VESSEL  04/20/2020  . IR EMBO VENOUS NOT HEMORR HEMANG  INC GUIDE ROADMAPPING  04/20/2020  . IR PARACENTESIS  04/21/2020  . IR US GUIDE VASC ACCESS RIGHT  04/20/2020  . IR VENOGRAM RENAL UNI LEFT  04/20/2020  . SHOULDER SURGERY Right     Prior to Admission medications   Medication Sig Start Date End Date Taking? Authorizing Provider  furosemide (LASIX) 40 MG tablet Take 1 tablet (40 mg total) by mouth 2 (two) times daily. 04/27/20   Rolly SalterPatel, Pranav M, MD  pantoprazole (PROTONIX) 40 MG tablet Take 1 tablet (40 mg total) by mouth 2 (two) times daily. 04/27/20   Rolly SalterPatel, Pranav M, MD  propranolol (INDERAL) 10 MG tablet Take 1 tablet (10 mg total) by mouth 2 (two) times daily. 04/27/20   Rolly SalterPatel, Pranav M, MD  spironolactone (ALDACTONE) 50 MG tablet Take 1 tablet (50 mg total) by mouth daily. 04/28/20   Rolly SalterPatel, Pranav M, MD  tamsulosin (FLOMAX) 0.4 MG CAPS capsule Take 0.4 mg by mouth.    [provider]  insulin detemir (LEVEMIR) 100 UNIT/ML injection Inject 20 Units into the skin at bedtime.  11/11/18  [provider]  metoprolol tartrate (LOPRESSOR) 25 MG tablet Take 25 mg by mouth 3 (three) times daily.   11/11/18  [provider]    Allergies Patient has no known allergies.  No family history on file.  Social History Social History   Tobacco Use  .  Smoking status: Never Smoker  . Smokeless tobacco: Never Used  Vaping Use  . Vaping Use: Never used  Substance Use Topics  . Alcohol use: Never  . Drug use: Never      Review of Systems Constitutional: No fever/chills, confusion, weakness Eyes: No visual changes. ENT: No sore throat. Cardiovascular: Denies chest pain. Respiratory: Denies shortness of breath. Gastrointestinal: No abdominal pain.  No nausea, no vomiting.  No diarrhea.  No constipation. Genitourinary: Negative for dysuria.  Difficulty urinating Musculoskeletal: Negative for back pain. Skin: Negative for  rash. Neurological: Negative for headaches, focal weakness or numbness. All other ROS negative ____________________________________________   PHYSICAL EXAM:  VITAL SIGNS: ED Triage Vitals [05/13/20 1122]  Enc Vitals Group     BP 111/61     Pulse Rate 60     Resp 18     Temp 98.4 F (36.9 C)     Temp Source Oral     SpO2 100 %     Weight      Height      Head Circumference      Peak Flow      Pain Score      Pain Loc      Pain Edu?      Excl. in GC?     Constitutional: Alert and oriented x3. Well appearing and in no acute distress. Eyes: Conjunctivae are normal. EOMI. Head: Atraumatic. Nose: No congestion/rhinnorhea. Mouth/Throat: Mucous membranes are moist.   Neck: No stridor. Trachea Midline. FROM Cardiovascular: Normal rate, regular rhythm. Grossly normal heart sounds.  Good peripheral circulation. Respiratory: Normal respiratory effort.  No retractions. Lungs CTAB. Gastrointestinal: Soft and nontender. No distention. No abdominal bruits.  Musculoskeletal: No lower extremity tenderness nor edema.  No joint effusions. Neurologic:  Normal speech and language. No gross focal neurologic deficits are appreciated.  Skin:  Skin is warm, dry and intact. No rash noted. Psychiatric: Mood and affect are normal. Speech and behavior are normal. GU: Significantly swollen testicles, initially unable to visualize the penile head but able to place a little bit of pressure in the penis was able to be exposed  ____________________________________________   LABS (all labs ordered are listed, but only abnormal results are displayed)  Labs Reviewed  BASIC METABOLIC PANEL - Abnormal; Notable for the following components:      Result Value   Potassium 3.3 (*)    Glucose, Bld 194 (*)    Creatinine, Ser 1.25 (*)    Calcium 8.4 (*)    GFR, Estimated 57 (*)    All other components within normal limits  CBC - Abnormal; Notable for the following components:   RBC 2.88 (*)    Hemoglobin  9.2 (*)    HCT 27.9 (*)    RDW 18.3 (*)    All other components within normal limits  URINALYSIS, COMPLETE (UACMP) WITH MICROSCOPIC - Abnormal; Notable for the following components:   Color, Urine YELLOW (*)    APPearance CLEAR (*)    Glucose, UA 50 (*)    Hgb urine dipstick SMALL (*)    All other components within normal limits  AMMONIA  PROTIME-INR  APTT  HEPATIC FUNCTION PANEL  CBG MONITORING, ED  TROPONIN I (HIGH SENSITIVITY)   ____________________________________________   ED ECG REPORT I, Concha Se, the attending physician, personally viewed and interpreted this ECG.  Normal sinus rate of 64, no ST elevation but does have T wave inversions in the inferior lateral leads, normal intervals.  These T  wave inversions are maybe a little bit more pronounced than prior ____________________________________________  RADIOLOGY   Official radiology report(s): CT Head Wo Contrast  Result Date: 05/13/2020 CLINICAL DATA:  Altered mental status EXAM: CT HEAD WITHOUT CONTRAST TECHNIQUE: Contiguous axial images were obtained from the base of the skull through the vertex without intravenous contrast. COMPARISON:  August 31 2013; brain MRI September 07, 2013 FINDINGS: Brain: There is age related volume loss. There is no intracranial mass, hemorrhage, extra-axial fluid collection, or midline shift. There is rather minimal decreased attenuation in the centra semiovale bilaterally. Elsewhere brain parenchyma appears unremarkable. No acute infarct is evident on this study. Vascular: No hyperdense vessels. There is calcification in the distal right vertebral artery and in each carotid siphon region. Skull: Bony calvarium appears intact. Sinuses/Orbits: There is mucosal thickening and opacification in multiple ethmoid air cells. There is mild mucosal thickening in each anterior sphenoid region. Visualized orbits appear symmetric bilaterally. Other: Visualized mastoid air cells are clear. There is mild debris  in each external auditory canal. IMPRESSION: Age related volume loss with slight periventricular small vessel disease. No acute infarct. No mass or hemorrhage. There are foci of arterial vascular calcification at several sites. There are foci paranasal sinus disease. Probable cerumen in each external auditory canal. Electronically Signed   By: Bretta Bang III M.D.   On: 05/13/2020 13:38    ____________________________________________   PROCEDURES  Procedure(s) performed (including Critical Care):  Procedures   ____________________________________________   INITIAL IMPRESSION / ASSESSMENT AND PLAN / ED COURSE  Elijah [name] was evaluated in Emergency Department on 05/13/2020 for the symptoms described in the history of present illness. He was evaluated in the context of the global COVID-19 pandemic, which necessitated consideration that the patient might be at risk for infection with the SARS-CoV-2 virus that causes COVID-19. Institutional protocols and algorithms that pertain to the evaluation of patients at risk for COVID-19 are in a state of rapid change based on information released by regulatory bodies including the CDC and federal and state organizations. These policies and algorithms were followed during the patient's care in the ED.    Patient is an 87 year old male who comes in with increased weakness and altered mental status.  Patient recently underwent TIPS procedure due to severe cirrhosis.  Will get labs to evaluate for worsening anemia although according to son no dark stools have reoccurred.  His abdomen is soft and nontender I have low suspicion for SBP at this time.  He does have significant swelling around his testicles and has had difficulty urinating so I suspect that he could have some urinary retention.  Foley will be placed to evaluate for UTI.  CT head ordered to evaluate for intercranial hemorrhage given the confusion.  We will add on ammonia level given the  confusion.  Will get glucose to evaluate for DKA  Kidney function slightly elevated at 1.25.  Glucose slightly elevated at 194 but no evidence of DKA.  UA without evidence of UTI.  Some RBCs in it but most likely secondary to catheterization.  Upon placing catheter 500 cc of fluid came out  Patient's cardiac marker is elevated but much downtrending from previously so most likely demand in nature I do not think he is having a heart attack.  LFTs are similar to prior.  His ammonia level is elevated at 72.  Given his worsening confusion I wonder if it could be related to this.  Will discuss with the hospital team for admission.  Clinical Course as of 05/13/20 1416  Tue May 13, 2020  1415 Ammonia(!) [MF]    Clinical Course User Index [MF] Concha Se, MD     ____________________________________________   FINAL CLINICAL IMPRESSION(S) / ED DIAGNOSES   Final diagnoses:  Hepatic encephalopathy (HCC)      MEDICATIONS GIVEN DURING THIS VISIT:  Medications - No data to display   ED Discharge Orders    None       Note:  This document was prepared using Dragon voice recognition software and may include unintentional dictation errors.   Concha Se, MD 05/13/20 [phone]

## 2020-05-13 NOTE — ED Triage Notes (Signed)
Pt comes via EMS from home with c/o AMS and increased weakness. EMS reports they arrived on scene and found pt responsive but does mumble at time which is not normal per family.  T-98.8, HR-92, 98% RA, CO2-24, CBG-279. EMS gave 500 of LR  Pt does have foul urine odor and increased urination per EMS  Pt has 20g in place

## 2020-05-13 NOTE — Treatment Plan (Addendum)
Pt to unit at this time, pt is AxOx3. Pt has :   1) stage 1 pressure ulcer- non blanchable to sacrum (protocol started sacrum dressing placed. 2) puss white and yellow in color coming from penis (area cleansed and culture sent prior to cleaning) 3) Bruising to bilateral upper arms 4) scab to middle finger left arm 5) scab to outer right foot and baby toenail coming off covered with band aid 6) Bilateral scabs to left side of face upper head and lower cheek. 7) scrotum is +4 edema and swollen foley in place 8) skin is dry and flaky all over body 9) between patient thighs, dirt in between legs upon arrival to unit. Area cleansed at this time.  10) patient changed into gown.  11) Medications not given to patient in emergency room given at this time (lactulose, KCL, flush) . Pt took pills whole.   Pt assessed with nurse from [place], [place]. SCDs on patient at this time by Santina Evans, CNA. Pt bed alarm on. Dinner tray ordered. Pt admission not able to be completed as patient is not oriented enough to answer admission questions. Son called at this time and admission completed.  Tele [id number] connected to patient at this time.

## 2020-05-13 NOTE — H&P (Signed)
History and Physical    Elijah [name] ZJI:[id number] DOB: November 07, 1933 DOA: 05/13/2020  PCP: Jaclyn Shaggy, MD   Patient coming from: Home  I have personally briefly reviewed patient's old medical records in [facility]  Chief Complaint: Confusion Most of the history was obtained from patient's son Elijah [name] over the phone HPI: Elijah [name] is a 87 y.o. male with medical history significant for newly diagnosed liver cirrhosis with variceal bleed status post TIPS procedure, history of coronary artery disease status post recent NSTEMI, BPH and diet-controlled diabetes mellitus who was recently discharged from [facility] about 2 weeks ago and has been home with family receiving home health PT.  His son states that over the last couple of days, patient has been confused for instance when the phone rings he picks up the TV remote control instead of the cell phone and has been responding no to all questions which is unusual for him and so his son brought him to the ER for evaluation. In the ER patient was noted to have urinary retention and a Foley catheter was placed with drainage of about 600 cc of clear urine. I am unable to do a review of systems on this patient due to his confusion. Labs show sodium 136, potassium 3.9, chloride 101, bicarb 27, glucose 194, BUN 19, creatinine 1.25, calcium 8.4, alkaline phosphatase 293, albumin 2.1, AST 46, ALT 44, total protein 6.5, ammonia 72, direct bilirubin 0.3, direct bilirubin 1.0, white count 6.8, hemoglobin 9.2, hematocrit 27.9, MCV 96.9, RDW 18.3, platelet count 169, PT 14.3, INR 1.2 Urine analysis is sterile CT scan of the head without contrast shows age related volume loss with slight periventricular small vessel disease. No acute infarct. No mass or hemorrhage. There are foci of arterial vascular calcification at several sites. There are foci paranasal sinus disease. Probable cerumen in each external auditory canal. Twelve-lead  EKG reviewed by me shows diffuse ST and T wave abnormality   ED Course: Patient is an 87 year old Caucasian male with a history of liver cirrhosis status post recent TIPS procedure who was brought into the emergency room by his son for evaluation of confusion.  Patient was noted to have urinary retention and had a Foley catheter placed in the ER with drainage of about 600 cc of urine.  Patient has an elevated lactulose level.  He will be referred to observation status for possible hepatic encephalopathy.  Review of Systems: As per HPI otherwise all other systems reviewed and negative.    Past Medical History:  Diagnosis Date  . Coronary artery disease   . Diabetes mellitus without complication [facility])     Past Surgical History:  Procedure Laterality Date  . ESOPHAGOGASTRODUODENOSCOPY (EGD) WITH PROPOFOL N/A 04/18/2020   Procedure: ESOPHAGOGASTRODUODENOSCOPY (EGD) WITH PROPOFOL;  Surgeon: Regis Bill, MD;  Location: [facility] ENDOSCOPY;  Service: Endoscopy;  Laterality: N/A;  . IR ANGIOGRAM SELECTIVE EACH ADDITIONAL VESSEL  04/20/2020  . IR ANGIOGRAM SELECTIVE EACH ADDITIONAL VESSEL  04/20/2020  . IR ANGIOGRAM SELECTIVE EACH ADDITIONAL VESSEL  04/20/2020  . IR EMBO VENOUS NOT HEMORR HEMANG  INC GUIDE ROADMAPPING  04/20/2020  . IR PARACENTESIS  04/21/2020  . IR US GUIDE VASC ACCESS RIGHT  04/20/2020  . IR VENOGRAM RENAL UNI LEFT  04/20/2020  . SHOULDER SURGERY Right      reports that he has never smoked. He has never used smokeless tobacco. He reports that he does not drink alcohol and does not use drugs.  No Known Allergies  Family History  Family history unknown: Yes      Prior to Admission medications   Medication Sig Start Date End Date Taking? Authorizing Provider  furosemide (LASIX) 40 MG tablet Take 1 tablet (40 mg total) by mouth 2 (two) times daily. 04/27/20   Rolly Salter, MD  pantoprazole (PROTONIX) 40 MG tablet Take 1 tablet (40 mg total) by mouth 2 (two) times daily.  04/27/20   Rolly Salter, MD  propranolol (INDERAL) 10 MG tablet Take 1 tablet (10 mg total) by mouth 2 (two) times daily. 04/27/20   Rolly Salter, MD  spironolactone (ALDACTONE) 50 MG tablet Take 1 tablet (50 mg total) by mouth daily. 04/28/20   Rolly Salter, MD  tamsulosin (FLOMAX) 0.4 MG CAPS capsule Take 0.4 mg by mouth.    [provider]  insulin detemir (LEVEMIR) 100 UNIT/ML injection Inject 20 Units into the skin at bedtime.  11/11/18  [provider]  metoprolol tartrate (LOPRESSOR) 25 MG tablet Take 25 mg by mouth 3 (three) times daily.   11/11/18  [provider]    Physical Exam: Vitals:   05/13/20 1122  BP: 111/61  Pulse: 60  Resp: 18  Temp: 98.4 F (36.9 C)  TempSrc: Oral  SpO2: 100%     Vitals:   05/13/20 1122  BP: 111/61  Pulse: 60  Resp: 18  Temp: 98.4 F (36.9 C)  TempSrc: Oral  SpO2: 100%      Constitutional:  Sleeping but arousable.  Oriented to person and place.  Not in any apparent distress.  Chronically ill-appearing HEENT:      Head: Normocephalic and atraumatic.         Eyes: PERLA, EOMI, Conjunctivae pallor  Sclera is non-icteric.       Mouth/Throat: Mucous membranes are moist.       Neck: Supple with no signs of meningismus. Cardiovascular: Regular rate and rhythm. No murmurs, gallops, or rubs. 2+ symmetrical distal pulses are present . No JVD. No LE edema Respiratory: Respiratory effort normal .Lungs sounds clear bilaterally. No wheezes, crackles, or rhonchi.  Gastrointestinal: Soft, non tender, and non distended with positive bowel sounds.  Genitourinary: No CVA tenderness.  Foley catheter in place Musculoskeletal: Nontender with normal range of motion in all extremities. No cyanosis, or erythema of extremities. Neurologic:  Face is symmetric. Moving all extremities. No gross focal neurologic deficits . Skin: Skin is warm, dry.  No rash or ulcers Psychiatric: Mood and affect are normal   Labs on Admission: I  have personally reviewed following labs and imaging studies  CBC: Recent Labs  Lab 05/13/20 1131  WBC 6.8  HGB 9.2*  HCT 27.9*  MCV 96.9  PLT 169   Basic Metabolic Panel: Recent Labs  Lab 05/13/20 1131  NA 136  K 3.3*  CL 101  CO2 27  GLUCOSE 194*  BUN 19  CREATININE 1.25*  CALCIUM 8.4*   GFR: CrCl cannot be calculated (Unknown ideal weight.). Liver Function Tests: Recent Labs  Lab 05/13/20 1252  AST 46*  ALT 44  ALKPHOS 293*  BILITOT 1.3*  PROT 6.5  ALBUMIN 2.1*   No results for input(s): LIPASE, AMYLASE in the last 168 hours. Recent Labs  Lab 05/13/20 1252  AMMONIA 72*   Coagulation Profile: Recent Labs  Lab 05/13/20 1252  INR 1.2   Cardiac Enzymes: No results for input(s): CKTOTAL, CKMB, CKMBINDEX, TROPONINI in the last 168 hours. BNP (last 3 results) No results for  input(s): PROBNP in the last 8760 hours. HbA1C: No results for input(s): HGBA1C in the last 72 hours. CBG: No results for input(s): GLUCAP in the last 168 hours. Lipid Profile: No results for input(s): CHOL, HDL, LDLCALC, TRIG, CHOLHDL, LDLDIRECT in the last 72 hours. Thyroid Function Tests: No results for input(s): TSH, T4TOTAL, FREET4, T3FREE, THYROIDAB in the last 72 hours. Anemia Panel: No results for input(s): VITAMINB12, FOLATE, FERRITIN, TIBC, IRON, RETICCTPCT in the last 72 hours. Urine analysis:    Component Value Date/Time   COLORURINE YELLOW (A) 05/13/2020 1131   APPEARANCEUR CLEAR (A) 05/13/2020 1131   APPEARANCEUR Hazy 08/28/2013 2112   LABSPEC 1.014 05/13/2020 1131   LABSPEC 1.018 08/28/2013 2112   PHURINE 6.0 05/13/2020 1131   GLUCOSEU 50 (A) 05/13/2020 1131   GLUCOSEU 150 mg/dL 22/97/9892 1194   HGBUR SMALL (A) 05/13/2020 1131   BILIRUBINUR NEGATIVE 05/13/2020 1131   BILIRUBINUR Negative 08/28/2013 2112   KETONESUR NEGATIVE 05/13/2020 1131   PROTEINUR NEGATIVE 05/13/2020 1131   NITRITE NEGATIVE 05/13/2020 1131   LEUKOCYTESUR NEGATIVE 05/13/2020 1131    LEUKOCYTESUR Negative 08/28/2013 2112    Radiological Exams on Admission: CT Head Wo Contrast  Result Date: 05/13/2020 CLINICAL DATA:  Altered mental status EXAM: CT HEAD WITHOUT CONTRAST TECHNIQUE: Contiguous axial images were obtained from the base of the skull through the vertex without intravenous contrast. COMPARISON:  August 31 2013; brain MRI September 07, 2013 FINDINGS: Brain: There is age related volume loss. There is no intracranial mass, hemorrhage, extra-axial fluid collection, or midline shift. There is rather minimal decreased attenuation in the centra semiovale bilaterally. Elsewhere brain parenchyma appears unremarkable. No acute infarct is evident on this study. Vascular: No hyperdense vessels. There is calcification in the distal right vertebral artery and in each carotid siphon region. Skull: Bony calvarium appears intact. Sinuses/Orbits: There is mucosal thickening and opacification in multiple ethmoid air cells. There is mild mucosal thickening in each anterior sphenoid region. Visualized orbits appear symmetric bilaterally. Other: Visualized mastoid air cells are clear. There is mild debris in each external auditory canal. IMPRESSION: Age related volume loss with slight periventricular small vessel disease. No acute infarct. No mass or hemorrhage. There are foci of arterial vascular calcification at several sites. There are foci paranasal sinus disease. Probable cerumen in each external auditory canal. Electronically Signed   By: Bretta Bang III M.D.   On: 05/13/2020 13:38     Assessment/Plan Principal Problem:   Hepatic encephalopathy (HCC) Active Problems:   Cirrhosis of liver (HCC)   Diabetes mellitus without complication (HCC)   Urinary retention due to benign prostatic hyperplasia   Hypokalemia    Hepatic encephalopathy Patient with a history of liver cirrhosis status post recent TIPS procedure who presents to the ER for evaluation of confusion He has slightly elevated  ammonia levels We will place patient on lactulose 30 g 3 times daily.  Hold for more than 2-3 soft stools    History of liver cirrhosis Maintain low-sodium diet Continue spironolactone, propanolol and furosemide    BPH with urinary retention Patient had a Foley catheter placed in the ER with drainage of about 600 cc of urine Continue Flomax Will need voiding trial prior to discharge    Diabetes mellitus Diet controlled Maintain consistent carbohydrate diet   Hypokalemia Secondary to diuretic therapy Supplement potassium  DVT prophylaxis: SCDs Code Status: DO NOT RESUSCITATE Family Communication: Greater than 50% of time was spent discussing patient's condition and plan of care with his son Bulmaro Feagans  over the phone.  All questions and concerns have been addressed.  He verbalizes understanding and agrees with the plan.  CODE STATUS was discussed and he is a DO NOT RESUSCITATE Disposition Plan: Back to previous home environment Consults called: none Status: Observation    Edu On MD [facility]     05/13/2020, 2:48 PM

## 2020-05-13 NOTE — ED Notes (Signed)
Pt disoriented. Significant swelling noted around penis, pus noted

## 2020-05-14 DIAGNOSIS — L8990 Pressure ulcer of unspecified site, unspecified stage: Secondary | ICD-10-CM | POA: Insufficient documentation

## 2020-05-14 DIAGNOSIS — K7290 Hepatic failure, unspecified without coma: Secondary | ICD-10-CM

## 2020-05-14 LAB — CBC
HCT: 29.9 % — ABNORMAL LOW (ref 39.0–52.0)
Hemoglobin: 9.8 g/dL — ABNORMAL LOW (ref 13.0–17.0)
MCH: 31.9 pg (ref 26.0–34.0)
MCHC: 32.8 g/dL (ref 30.0–36.0)
MCV: 97.4 fL (ref 80.0–100.0)
Platelets: 192 10*3/uL (ref 150–400)
RBC: 3.07 MIL/uL — ABNORMAL LOW (ref 4.22–5.81)
RDW: 18.5 % — ABNORMAL HIGH (ref 11.5–15.5)
WBC: 7.5 10*3/uL (ref 4.0–10.5)
nRBC: 0 % (ref 0.0–0.2)

## 2020-05-14 LAB — BASIC METABOLIC PANEL
Anion gap: 10 (ref 5–15)
BUN: 22 mg/dL (ref 8–23)
CO2: 22 mmol/L (ref 22–32)
Calcium: 8.2 mg/dL — ABNORMAL LOW (ref 8.9–10.3)
Chloride: 101 mmol/L (ref 98–111)
Creatinine, Ser: 1.4 mg/dL — ABNORMAL HIGH (ref 0.61–1.24)
GFR, Estimated: 50 mL/min — ABNORMAL LOW (ref >60)
Glucose, Bld: 359 mg/dL — ABNORMAL HIGH (ref 70–99)
Potassium: 3.8 mmol/L (ref 3.5–5.1)
Sodium: 133 mmol/L — ABNORMAL LOW (ref 135–145)

## 2020-05-14 LAB — GLUCOSE, CAPILLARY
Glucose-Capillary: 337 mg/dL — ABNORMAL HIGH (ref 70–99)
Glucose-Capillary: 375 mg/dL — ABNORMAL HIGH (ref 70–99)
Glucose-Capillary: 312 mg/dL — ABNORMAL HIGH (ref 70–99)
Glucose-Capillary: 182 mg/dL — ABNORMAL HIGH (ref 70–99)

## 2020-05-14 MED ORDER — CHLORHEXIDINE GLUCONATE CLOTH 2 % EX PADS
6.0000 | MEDICATED_PAD | Freq: Every day | CUTANEOUS | Status: DC
  Administered 2020-05-15: 6 via TOPICAL

## 2020-05-14 MED ORDER — LACTULOSE 10 GM/15ML PO SOLN: 10.0000 g | Freq: Two times a day (BID) | ORAL | Status: DC

## 2020-05-14 MED ORDER — INSULIN ASPART 100 UNIT/ML ~~LOC~~ SOLN
0.0000 [IU] | Freq: Every day | SUBCUTANEOUS | Status: DC
  Administered 2020-05-15: 3 [IU] via SUBCUTANEOUS
  Administered 2020-05-16: 2 [IU] via SUBCUTANEOUS
  Filled 2020-05-14 (×2): qty 1

## 2020-05-14 MED ORDER — LACTULOSE 10 GM/15ML PO SOLN
10.0000 g | Freq: Every day | ORAL | Status: DC
  Administered 2020-05-14: 10 g via ORAL
  Filled 2020-05-14: qty 30

## 2020-05-14 MED ORDER — INSULIN ASPART 100 UNIT/ML ~~LOC~~ SOLN
0.0000 [IU] | Freq: Three times a day (TID) | SUBCUTANEOUS | Status: DC
  Administered 2020-05-14: 9 [IU] via SUBCUTANEOUS
  Administered 2020-05-14: 7 [IU] via SUBCUTANEOUS
  Administered 2020-05-15: 2 [IU] via SUBCUTANEOUS
  Administered 2020-05-15: 5 [IU] via SUBCUTANEOUS
  Administered 2020-05-15 – 2020-05-16 (×2): 3 [IU] via SUBCUTANEOUS
  Administered 2020-05-16: 7 [IU] via SUBCUTANEOUS
  Administered 2020-05-16: 9 [IU] via SUBCUTANEOUS
  Filled 2020-05-14 (×8): qty 1

## 2020-05-14 MED ORDER — GLIMEPIRIDE 2 MG PO TABS
2.0000 mg | ORAL_TABLET | Freq: Every day | ORAL | Status: DC
  Administered 2020-05-15 – 2020-05-17 (×3): 2 mg via ORAL
  Filled 2020-05-14 (×4): qty 1

## 2020-05-14 NOTE — Progress Notes (Signed)
[facility]  - [place] [facility]   PATIENT NAME: Kamarrion Stfort    MR#:  [id number]  DATE OF BIRTH:  Nov 17, 1933  SUBJECTIVE:  patient came in from home with increasing confusion. Found to have elevated ammonia. Newly diagnosed cirrhosis of liver with large esophageal variances underwent tips procedure at [facility] two weeks ago.  Eating and tolerating PO diet well. Has had several BMs after couple doses of lactulose given. Patient is awake alert and oriented times two.  REVIEW OF SYSTEMS:   Review of Systems  Constitutional: Negative for chills, fever and weight loss.  HENT: Negative for ear discharge, ear pain and nosebleeds.   Eyes: Negative for blurred vision, pain and discharge.  Respiratory: Negative for sputum production, shortness of breath, wheezing and stridor.   Cardiovascular: Negative for chest pain, palpitations, orthopnea and PND.  Gastrointestinal: Negative for abdominal pain, diarrhea, nausea and vomiting.  Genitourinary: Negative for frequency and urgency.  Musculoskeletal: Negative for back pain and joint pain.  Neurological: Positive for weakness. Negative for sensory change, speech change and focal weakness.  Psychiatric/Behavioral: Negative for depression and hallucinations. The patient is not nervous/anxious.    Tolerating Diet:yes Tolerating PT:   DRUG ALLERGIES:  No Known Allergies  VITALS:  Blood pressure 95/62, pulse 75, temperature 97.8 F (36.6 C), temperature source Oral, resp. rate 19, weight 78.4 kg, SpO2 95 %.  PHYSICAL EXAMINATION:   Physical Exam  GENERAL:  87 y.o.-year-old patient lying in the bed with no acute distress.  HEENT: Head atraumatic, normocephalic. Oropharynx and nasopharynx clear.  LUNGS: Normal breath sounds bilaterally, no wheezing, rales, rhonchi. No use of accessory muscles of respiration.  CARDIOVASCULAR: S1, S2 normal. No murmurs, rubs, or gallops.  ABDOMEN: Soft, nontender, nondistended. Bowel  sounds present. No organomegaly or mass. No ascites EXTREMITIES: No cyanosis, clubbing or edema b/l.    NEUROLOGIC: Cranial nerves II through XII are intact. No focal Motor or sensory deficits b/l.  weak PSYCHIATRIC:  patient is alert and oriented x 2. mood affect normal SKIN: No obvious rash, lesion, or ulcer.   LABORATORY PANEL:  CBC Recent Labs  Lab 05/14/20 0447  WBC 7.5  HGB 9.8*  HCT 29.9*  PLT 192    Chemistries  Recent Labs  Lab 05/13/20 1252 05/14/20 0447  NA  --  133*  K  --  3.8  CL  --  101  CO2  --  22  GLUCOSE  --  359*  BUN  --  22  CREATININE  --  1.40*  CALCIUM  --  8.2*  AST 46*  --   ALT 44  --   ALKPHOS 293*  --   BILITOT 1.3*  --    Cardiac Enzymes No results for input(s): TROPONINI in the last 168 hours. RADIOLOGY:  CT Head Wo Contrast  Result Date: 05/13/2020 CLINICAL DATA:  Altered mental status EXAM: CT HEAD WITHOUT CONTRAST TECHNIQUE: Contiguous axial images were obtained from the base of the skull through the vertex without intravenous contrast. COMPARISON:  August 31 2013; brain MRI September 07, 2013 FINDINGS: Brain: There is age related volume loss. There is no intracranial mass, hemorrhage, extra-axial fluid collection, or midline shift. There is rather minimal decreased attenuation in the centra semiovale bilaterally. Elsewhere brain parenchyma appears unremarkable. No acute infarct is evident on this study. Vascular: No hyperdense vessels. There is calcification in the distal right vertebral artery and in each carotid siphon region. Skull: Bony calvarium appears intact. Sinuses/Orbits: There is  mucosal thickening and opacification in multiple ethmoid air cells. There is mild mucosal thickening in each anterior sphenoid region. Visualized orbits appear symmetric bilaterally. Other: Visualized mastoid air cells are clear. There is mild debris in each external auditory canal. IMPRESSION: Age related volume loss with slight periventricular small vessel  disease. No acute infarct. No mass or hemorrhage. There are foci of arterial vascular calcification at several sites. There are foci paranasal sinus disease. Probable cerumen in each external auditory canal. Electronically Signed   By: Bretta Bang III M.D.   On: 05/13/2020 13:38   ASSESSMENT AND PLAN:  NASIF BOS is a 87 y.o. male with medical history significant for newly diagnosed liver cirrhosis with variceal bleed status post TIPS procedure, history of coronary artery disease status post recent NSTEMI, BPH and diet-controlled diabetes mellitus who was recently discharged from [facility] about 2 weeks ago and has been home with family receiving home health PT.  His son states that over the last couple of days, patient has been confused and so his son brought him to the ER for evaluation.  Acute Hepatic encephalopathy POA -improved mentation -Patient with a history of liver cirrhosis status post recent TIPS procedure who presents to the ER for evaluation of confusion -He has slightly elevated ammonia levels of 72. --on lactulose 30 g 3 times daily--- had several BMs. Will hold lactulose.   History of liver cirrhosis -Maintain low-sodium diet -Continue spironolactone, propanolol and furosemide -patient had paracentesis few weeks ago. -Follow-up with [facility] as outpatient. Has appointment for March 30-  BPH with urinary retention -Patient had a Foley catheter placed in the ER with drainage of about 600 cc of urine--remove foley today -Continue Flomax  Diabetes mellitus-2, uncontrolled/labile in the setting of cirrhosis of liver -- A1c 5.4 -- will start Amaryl 2 mg daily and sliding scale insulin three times a day with meals--- this was discussed with patient's son Samvel Zinn -Maintain consistent carbohydrate diet  Hypokalemia Secondary to diuretic therapy Supplement potassium  Generalized weakness. Physical therapy to see patient  DVT prophylaxis:  SCDs Code Status: DO NOT RESUSCITATE Family Communication: Greater than 50% of time was spent discussing patient's condition and plan of care with his son Senica Crall over the phone.   Disposition Plan: Back to previous home environment Consults called: none Status: Observation  Continue to monitor for one more day. Check ammonia levels tomorrow. Monitor sugars. Likely discharge home with home health PT tomorrow. Discussed with son at length agreeable with plan.    TOTAL TIME TAKING CARE OF THIS PATIENT: *25* minutes.  >50% time spent on counselling and coordination of care  Note: This dictation was prepared with Dragon dictation along with smaller phrase technology. Any transcriptional errors that result from this process are unintentional.  Enedina Finner M.D    [facility]   CC: Primary care physician; Jaclyn Shaggy, MDPatient ID: [name], male   DOB: 10-06-1933, 87 y.o.   MRN: [id number]

## 2020-05-14 NOTE — Care Plan (Signed)
Student nurse attempted to place an IV in the right forearm. IV did not take and arm bruised up immediatly. MD Allena Katz made aware via secure chat. IV placed by writer in the right AC, left AC Iv removed due to infiltration.

## 2020-05-14 NOTE — Care Plan (Signed)
Tele called and made aware of order d/c.

## 2020-05-14 NOTE — Evaluation (Addendum)
Physical Therapy Evaluation Patient Details Name: Elijah [name] MRN: [id number] DOB: 11/03/1933 Today's Date: 05/14/2020   History of Present Illness  Pt admitted to [facility] on 05/13/20 under observation for c/o progressive AMS and increased weakness; pt also with significant swelling around testicles. Significant PMH includes: newly dx liver cirrhosis with variceal bleed s/p TIPS procedure, hx CAD s/p recent NSTEMI, BPH, and DM. Pt recently DC from [facility] about 2wks ago after TIPS procedure. Imaging unremarkable for acute changes.    Clinical Impression  Pt is a 87 year old M admitted to hospital on 2/22 for progressive AMS and weakness. Per chart review, daughter states that pt used to be Ind with mobility/BADL's, but since hospitalization, he has required assistance for all ADL's, IADL's, transfers, and household ambulation with RW. Pt presents with functional weakness, decreased gross balance, decreased activity tolerance, poor motor control/grading/planning/coordination, significant scrotal edema, and poor safety awareness, resulting in impaired functional mobility from baseline. Due to deficits, pt required supervision for bed mobility, min assist for transfers, and max assist to take 1 lateral step at bedside with RW. Due to poor motor control and balance, pt unsafe to ambulate at this time. Deficits limit the pt's ability to safely and independently perform ADL's, transfer, and ambulate. Pt will benefit from acute skilled PT services to address deficits for return to baseline function. At this time, PT recommends SNF at DC to address deficits and improve overall safety with functional mobility prior to return home.  If pt/family decline PT SNF recommendation, will recommend DC home with HHPT, 24/7 care, and +2 assist for transfers/mobility. Pt will also benefit from hospital bed to prevent decubitus ulcers and improve overall safety with bed mobility/transfers, as he required increased  assistance from hospital bed's electric features to safely facilitate mobility.     Follow Up Recommendations SNF;Supervision for mobility/OOB    Equipment Recommendations  Other (comment) (Defer to post acute)    Recommendations for Other Services       Precautions / Restrictions Precautions Precautions: Fall Restrictions Weight Bearing Restrictions: No      Mobility  Bed Mobility Overal bed mobility: Needs Assistance Bed Mobility: Supine to Sit;Sit to Supine     Supine to sit: Supervision;HOB elevated Sit to supine: Mod assist   General bed mobility comments: Supervision with max multimodal cues for supine>sit; HOB elevated. Mod assist for BLE facilitation onto bed for sit>supine transfer.    Transfers Overall transfer level: Needs assistance Equipment used: Rolling walker (2 wheeled) Transfers: Sit to/from Stand Sit to Stand: Min assist         General transfer comment: Min assist for STS transfer from elevated surface in RW. Max multimodal cues for sequencing and hand placement for safety. Poor eccentric control.  Ambulation/Gait Ambulation/Gait assistance: Max assist Gait Distance (Feet):  (1 step)         General Gait Details: Max assist to take 1 lateral step towards HOB in RW. Pt demonstrates L lateral lean and poor motor control/grading/coordination. Despite max multimodal cues, pt unable to take 1 lateral step with LLE, but was able to take 1 lateral step with RLE. Unsafe to continue ambulation at this time.     Balance Overall balance assessment: Needs assistance Sitting-balance support: Bilateral upper extremity supported;Feet supported Sitting balance-Leahy Scale: Poor Sitting balance - Comments: Mild posterior lean requiring CGA-min assist and multimodal cues for correction Postural control: Posterior lean Standing balance support: Bilateral upper extremity supported;During functional activity Standing balance-Leahy Scale: Zero Standing  balance comment: Zero standing balance requiring max assist; pt with L lateral lean                             Pertinent Vitals/Pain Pain Assessment: No/denies pain    Home Living Family/patient expects to be discharged to:: Private residence Living Arrangements: Children Available Help at Discharge: Family;Available PRN/intermittently (Reports nephew is there during the day, and daughter is there at night, but doesn't stay overnight) Type of Home: House Home Access: Stairs to enter Entrance Stairs-Rails: Right;Left;Can reach both Entrance Stairs-Number of Steps: 3 Home Layout: One level Home Equipment: Walker - 2 wheels;Shower seat;Bedside commode;Grab bars - tub/shower;Grab bars - toilet;Cane - quad Additional Comments: lift chair    Prior Function Level of Independence: Needs assistance   Gait / Transfers Assistance Needed: Pt reports needing assist from family for transfers and gait with RW.  ADL's / Homemaking Assistance Needed: Prior to onset of worsening weakness, pt was Ind with BADL's. Since, pt notes he requires assist for all ADL's and IADL's  Comments: Multiple falls in the past year; did not hit head or break bones     Hand Dominance   Dominant Hand: Right    Extremity/Trunk Assessment   Upper Extremity Assessment Upper Extremity Assessment: Overall WFL for tasks assessed (Grossly 4/5)    Lower Extremity Assessment Lower Extremity Assessment: Overall WFL for tasks assessed (Grossly 4/5)    Cervical / Trunk Assessment Cervical / Trunk Assessment: Kyphotic  Communication      Cognition Arousal/Alertness: Awake/alert Behavior During Therapy: WFL for tasks assessed/performed Overall Cognitive Status: Within Functional Limits for tasks assessed                                 General Comments: Pt is A&O x4 and able to follow 100% of simple 2 step commands; intermittently requires increased time for processing of task       General Comments General comments (skin integrity, edema, etc.): Significant scrotal edema    Exercises Other Exercises Other Exercises: Pt able to participate in bed mobility and transfers with grossly supervision-min assist. Pt required max assist to take 1 lateral step with RLE at bedside in RW. Max multimodal cues required for all of mobility. Poor-Zero seated and standing balance. Other Exercises: Pt educated regarding: PT role/POC, DC recommendations, safety with mobility.   Assessment/Plan    PT Assessment Patient needs continued PT services  PT Problem List Decreased strength;Decreased activity tolerance;Decreased balance;Decreased mobility;Cardiopulmonary status limiting activity;Decreased safety awareness;Decreased coordination;Decreased cognition       PT Treatment Interventions DME instruction;Gait training;Functional mobility training;Therapeutic activities;Therapeutic exercise;Balance training;Patient/family education;Stair training;Neuromuscular re-education    PT Goals (Current goals can be found in the Care Plan section)  Acute Rehab PT Goals Patient Stated Goal: to get stronger and go home PT Goal Formulation: With patient Time For Goal Achievement: 05/28/20 Potential to Achieve Goals: Fair    Frequency Min 2X/week    AM-PAC PT "6 Clicks" Mobility  Outcome Measure Help needed turning from your back to your side while in a flat bed without using bedrails?: A Little Help needed moving from lying on your back to sitting on the side of a flat bed without using bedrails?: A Lot Help needed moving to and from a bed to a chair (including a wheelchair)?: Total Help needed standing up from a chair using your arms (e.g., wheelchair or  bedside chair)?: A Little Help needed to walk in hospital room?: Total Help needed climbing 3-5 steps with a railing? : Total 6 Click Score: 11    End of Session Equipment Utilized During Treatment: Gait belt Activity Tolerance: Patient  tolerated treatment well;Patient limited by fatigue Patient left: with call bell/phone within reach;in bed;with bed alarm set;with SCD's reapplied (bed in chair position) Nurse Communication: Mobility status (IV site bleeding) PT Visit Diagnosis: Muscle weakness (generalized) (M62.81);Difficulty in walking, not elsewhere classified (R26.2);Unsteadiness on feet (R26.81);History of falling (Z91.81)    Time: 1027-2536 PT Time Calculation (min) (ACUTE ONLY): 22 min   Charges:   PT Evaluation $PT Eval Moderate Complexity: 1 Mod         Vira Blanco, PT, DPT 3:14 PM,05/14/20

## 2020-05-14 NOTE — Care Management Obs Status (Signed)
MEDICARE OBSERVATION STATUS NOTIFICATION   Patient Details  Name: Elijah [name] MRN: [id number] Date of Birth: 09/19/1933   Medicare Observation Status Notification Given:  Yes    Chapman Fitch, RN 05/14/2020, 1:59 PM

## 2020-05-14 NOTE — Care Management (Signed)
    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   The above medical condition requires: Patient requires the ability to reposition frequently   Head must be elevated greater than: 30 degrees   Bed type Semi-electric   Hoyer Lift Yes   Support Surface: Gel Overlay      05/14/20 1525

## 2020-05-14 NOTE — TOC Initial Note (Addendum)
Transition of Care [facility]) - Initial/Assessment Note    Patient Details  Name: Elijah [name] MRN: [id number] Date of Birth: 11-06-1933  Transition of Care [facility]) CM/SW Contact:    Chapman Fitch, RN Phone Number: 05/14/2020, 2:01 PM  Clinical Narrative:                 Patient admitted from home with encephalopathy Assessment completed with son Gery Pray via phone Patient lives in his own home, daughter lives in the home with him  PCP Arlana Pouch - son provides transportation to appointments [facility] - denies issues obtaining medications  PT eval to be ordered per MD  Patient open with [facility] for PT. Cory with [facility] notified of admission Patient received RW earlier this month    Update:  PT recommending SNF.  Son wishes to take patient home with 24/7 care, resumption of [facility]. Agreeable to hospital bed Referral made to patricia with [facility]   Expected Discharge Plan: Home w Home Health Services Barriers to Discharge: Continued Medical Work up   Patient Goals and CMS Choice        Expected Discharge Plan and Services Expected Discharge Plan: Home w Home Health Services                                              Prior Living Arrangements/Services   Lives with:: Adult Children              Current home services: DME,Home PT    Activities of Daily Living Home Assistive Devices/Equipment: Environmental consultant (specify type) ADL Screening (condition at time of admission) Patient's cognitive ability adequate to safely complete daily activities?: No Is the patient deaf or have difficulty hearing?: Yes Does the patient have difficulty seeing, even when wearing glasses/contacts?: No Does the patient have difficulty concentrating, remembering, or making decisions?: Yes Patient able to express need for assistance with ADLs?: Yes Does the patient have difficulty dressing or bathing?: Yes Independently performs ADLs?: No Communication: Needs  assistance Is this a change from baseline?: Pre-admission baseline Dressing (OT): Needs assistance Is this a change from baseline?: Pre-admission baseline Grooming: Needs assistance Is this a change from baseline?: Pre-admission baseline Feeding: Independent Bathing: Needs assistance Is this a change from baseline?: Pre-admission baseline Toileting: Needs assistance Is this a change from baseline?: Pre-admission baseline In/Out Bed: Needs assistance Is this a change from baseline?: Pre-admission baseline Walks in Home: Needs assistance Is this a change from baseline?: Pre-admission baseline Does the patient have difficulty walking or climbing stairs?: Yes Weakness of Legs: Both Weakness of Arms/Hands: None  Permission Sought/Granted                  Emotional Assessment              Admission diagnosis:  Hepatic encephalopathy (HCC) [K72.90] Patient Active Problem List   Diagnosis Date Noted  . Pressure injury of skin 05/14/2020  . Hepatic encephalopathy (HCC) 05/13/2020  . Diabetes mellitus without complication (HCC)   . Urinary retention due to benign prostatic hyperplasia   . Hypokalemia   . NSTEMI (non-ST elevated myocardial infarction) (HCC) 04/18/2020  . Blood loss anemia 04/18/2020  . Cirrhosis of liver (HCC) 04/18/2020  . GIB (gastrointestinal bleeding) 04/17/2020  . CVA (cerebral infarction) 09/11/2013   PCP:  Jaclyn Shaggy, MD Pharmacy:   [phone] -  [place], [place] - [address] [place] [address] Phone: [phone] Fax: [phone]  [id number] [place], [place] - [address] [place] [address] Phone: [phone] Fax: [phone]     Social Determinants of Health (SDOH) Interventions    Readmission Risk Interventions No flowsheet data found.

## 2020-05-14 NOTE — Progress Notes (Addendum)
Inpatient Diabetes Program Recommendations  AACE/ADA: New Consensus Statement on Inpatient Glycemic Control (2015)  Target Ranges:  Prepandial:   less than 140 mg/dL      Peak postprandial:   less than 180 mg/dL (1-2 hours)      Critically ill patients:  140 - 180 mg/dL   Lab Results  Component Value Date   GLUCAP 337 (H) 05/14/2020   HGBA1C 5.4 04/18/2020    Review of Glycemic Control Results for Elijah [name], Elijah [name] (MRN [id number]) as of 05/14/2020 09:59  Ref. Range 05/13/2020 18:15 05/14/2020 07:36  Glucose-Capillary Latest Ref Range: 70 - 99 mg/dL 625 (H) 638 (H)   Diabetes history: DM 2 Outpatient Diabetes medications:  Amaryl 2 mg daily Current orders for Inpatient glycemic control:  Novolog sensitive tid with meals and HS Inpatient Diabetes Program Recommendations:   Please consider adding Levemir 8 units daily.    Thanks,  Beryl Meager, RN, BC-ADM Inpatient Diabetes Coordinator Pager [phone] (8a-5p)

## 2020-05-15 DIAGNOSIS — K7460 Unspecified cirrhosis of liver: Secondary | ICD-10-CM | POA: Diagnosis present

## 2020-05-15 DIAGNOSIS — L89321 Pressure ulcer of left buttock, stage 1: Secondary | ICD-10-CM | POA: Diagnosis present

## 2020-05-15 DIAGNOSIS — Z794 Long term (current) use of insulin: Secondary | ICD-10-CM | POA: Diagnosis not present

## 2020-05-15 DIAGNOSIS — T502X5A Adverse effect of carbonic-anhydrase inhibitors, benzothiadiazides and other diuretics, initial encounter: Secondary | ICD-10-CM | POA: Diagnosis not present

## 2020-05-15 DIAGNOSIS — K7200 Acute and subacute hepatic failure without coma: Secondary | ICD-10-CM | POA: Diagnosis present

## 2020-05-15 DIAGNOSIS — Y92239 Unspecified place in hospital as the place of occurrence of the external cause: Secondary | ICD-10-CM | POA: Diagnosis not present

## 2020-05-15 DIAGNOSIS — Z79899 Other long term (current) drug therapy: Secondary | ICD-10-CM | POA: Diagnosis not present

## 2020-05-15 DIAGNOSIS — I2510 Atherosclerotic heart disease of native coronary artery without angina pectoris: Secondary | ICD-10-CM | POA: Diagnosis present

## 2020-05-15 DIAGNOSIS — I252 Old myocardial infarction: Secondary | ICD-10-CM | POA: Diagnosis not present

## 2020-05-15 DIAGNOSIS — L89311 Pressure ulcer of right buttock, stage 1: Secondary | ICD-10-CM | POA: Diagnosis present

## 2020-05-15 DIAGNOSIS — K7290 Hepatic failure, unspecified without coma: Secondary | ICD-10-CM | POA: Diagnosis present

## 2020-05-15 DIAGNOSIS — Z20822 Contact with and (suspected) exposure to covid-19: Secondary | ICD-10-CM | POA: Diagnosis present

## 2020-05-15 DIAGNOSIS — E876 Hypokalemia: Secondary | ICD-10-CM | POA: Diagnosis not present

## 2020-05-15 DIAGNOSIS — R338 Other retention of urine: Secondary | ICD-10-CM | POA: Diagnosis present

## 2020-05-15 DIAGNOSIS — Z66 Do not resuscitate: Secondary | ICD-10-CM | POA: Diagnosis present

## 2020-05-15 DIAGNOSIS — E1165 Type 2 diabetes mellitus with hyperglycemia: Secondary | ICD-10-CM | POA: Diagnosis present

## 2020-05-15 DIAGNOSIS — N401 Enlarged prostate with lower urinary tract symptoms: Secondary | ICD-10-CM | POA: Diagnosis present

## 2020-05-15 DIAGNOSIS — Z8673 Personal history of transient ischemic attack (TIA), and cerebral infarction without residual deficits: Secondary | ICD-10-CM | POA: Diagnosis not present

## 2020-05-15 LAB — GLUCOSE, CAPILLARY
Glucose-Capillary: 163 mg/dL — ABNORMAL HIGH (ref 70–99)
Glucose-Capillary: 216 mg/dL — ABNORMAL HIGH (ref 70–99)
Glucose-Capillary: 260 mg/dL — ABNORMAL HIGH (ref 70–99)
Glucose-Capillary: 257 mg/dL — ABNORMAL HIGH (ref 70–99)

## 2020-05-15 LAB — AMMONIA: Ammonia: 134 umol/L — ABNORMAL HIGH (ref 9–35)

## 2020-05-15 MED ORDER — LACTULOSE 10 GM/15ML PO SOLN
20.0000 g | Freq: Two times a day (BID) | ORAL | Status: DC
  Administered 2020-05-15 – 2020-05-16 (×3): 20 g via ORAL
  Filled 2020-05-15 (×3): qty 30

## 2020-05-15 MED ORDER — LACTULOSE 10 GM/15ML PO SOLN
10.0000 g | Freq: Two times a day (BID) | ORAL | Status: DC
  Administered 2020-05-15: 10 g via ORAL
  Filled 2020-05-15: qty 30

## 2020-05-15 MED ORDER — SODIUM CHLORIDE 0.9 % IV BOLUS
250.0000 mL | Freq: Once | INTRAVENOUS | Status: AC
  Administered 2020-05-15: 250 mL via INTRAVENOUS

## 2020-05-15 MED ORDER — SODIUM CHLORIDE 0.9 % IV BOLUS
500.0000 mL | Freq: Once | INTRAVENOUS | Status: AC
  Administered 2020-05-15: 500 mL via INTRAVENOUS

## 2020-05-15 NOTE — Progress Notes (Signed)
Clinical/Bedside Swallow Evaluation Patient Details  Name: Elijah [name] MRN: [id number] Date of Birth: 03-03-1934  Today's Date: 05/15/2020 Time: SLP Start Time (ACUTE ONLY): 1515 SLP Stop Time (ACUTE ONLY): 1535 SLP Time Calculation (min) (ACUTE ONLY): 20 min  Past Medical History:  Past Medical History:  Diagnosis Date   Coronary artery disease    Diabetes mellitus without complication (HCC)    Past Surgical History:  Past Surgical History:  Procedure Laterality Date   ESOPHAGOGASTRODUODENOSCOPY (EGD) WITH PROPOFOL N/A 04/18/2020   Procedure: ESOPHAGOGASTRODUODENOSCOPY (EGD) WITH PROPOFOL;  Surgeon: Regis Bill, MD;  Location: [facility] ENDOSCOPY;  Service: Endoscopy;  Laterality: N/A;   IR ANGIOGRAM SELECTIVE EACH ADDITIONAL VESSEL  04/20/2020   IR ANGIOGRAM SELECTIVE EACH ADDITIONAL VESSEL  04/20/2020   IR ANGIOGRAM SELECTIVE EACH ADDITIONAL VESSEL  04/20/2020   IR EMBO VENOUS NOT HEMORR HEMANG  INC GUIDE ROADMAPPING  04/20/2020   IR PARACENTESIS  04/21/2020   IR US GUIDE VASC ACCESS RIGHT  04/20/2020   IR VENOGRAM RENAL UNI LEFT  04/20/2020   SHOULDER SURGERY Right    HPI:  Patient is an 87 y.o. male admitted from home with confusion, acute hepatic encephalopathy, urinary retention. Recently hospitalized with cirrhosis of liver with large esophageal varices per EGD on 04/18/20, underwent TIPS procedure at [facility] ~2 weeks ago. CT head negative for acute infarct. Hx also noted for CVA in 2015.   Assessment / Plan / Recommendation Clinical Impression   Patient presents with moderate risk for aspiration primarily due to altered mental status and esophageal history, although prior history of CVA, dysphagia are noted as well. Patient responds to simple questions however is easily distracted and does not always respond appropriately (oriented to self but gives name repeatedly when asked further questions). RN reports pt coughing with thin liquids, had difficulty with oral containment  while feeding earlier today. Generalized oral weakness with mouth-open posture, closes briefly but incompletely on command. Appears to have L>R facial weakness. Patient was able to hold cup but needed hand over hand assist, to steady cup and retrieve boluses. Poor awareness, with reduced coordination and containment with thin liquids. No overt signs of aspiration with single sips, but with multiple sips pt had cough x1. Improved oral control when self-feeding (with assist) cup and straw sips of nectar. No overt signs of aspiration even with challenging with consecutive boluses. Able to take bites of puree, groping for additional bites with eyes closed, required cues to maintain attention to and swallow bolus intermittently. At this time recommend full liquids, nectar-thick, with meds crushed in puree. Full supervision; assist with self-feeding as much as possible, only when alert. Position upright during and for 30 minutes after Pos. Reflux precautions. SLP to follow up for tolerance, advancement with improvements in mentation.     SLP Visit Diagnosis: Dysphagia, oropharyngeal phase (R13.12)    Aspiration Risk  Moderate aspiration risk    Diet Recommendation Nectar-thick liquid (full liquids)   Liquid Administration via: Cup;Straw (single sips- full assist and supervision) Medication Administration: Crushed with puree Supervision: Full supervision/cueing for compensatory strategies;Staff to assist with self feeding Compensations: Minimize environmental distractions;Slow rate;Small sips/bites Postural Changes: Seated upright at 90 degrees;Remain upright for at least 30 minutes after po intake    Other  Recommendations Oral Care Recommendations: Oral care before and after PO Other Recommendations: Order thickener from pharmacy;Prohibited food (jello, ice cream, thin soups);Remove water pitcher;Have oral suction available   Follow up Recommendations Skilled Nursing facility  Frequency and  Duration min 2x/week  2 weeks       Prognosis Prognosis for Safe Diet Advancement: Good (with improvements in mentation) Barriers to Reach Goals: Cognitive deficits      Swallow Study   General Date of Onset: 05/13/20 HPI: Patient is an 87 y.o. male admitted from home with confusion, acute hepatic encephalopathy, urinary retention. Recently hospitalized with cirrhosis of liver with large esophageal varices per EGD on 04/18/20, underwent TIPS procedure at [facility]2 weeks ago. CT head negative for acute infarct. Hx also noted for CVA in 2015. Type of Study: Bedside Swallow Evaluation Previous Swallow Assessment: Per SLP notes during prior admission pt had MBS in 2015 recommending dys 1/ nectar liquids; this report was unavailable. Pt advanced to thin liquids prior to d/c from CIR. Diet Prior to this Study: Regular;Thin liquids Temperature Spikes Noted: Yes Respiratory Status: Room air History of Recent Intubation: No Behavior/Cognition: Lethargic/Drowsy;Requires cueing;Distractible Oral Cavity Assessment: Other (comment) (mouth open posture at rest) Oral Care Completed by SLP: Yes Oral Cavity - Dentition: Dentures, top;Dentures, bottom Vision: Functional for self-feeding Self-Feeding Abilities: Needs assist Patient Positioning: Upright in bed Baseline Vocal Quality: Low vocal intensity (slurred speech) Volitional Cough: Cognitively unable to elicit Volitional Swallow: Able to elicit (with delay)    Oral/Motor/Sensory Function Overall Oral Motor/Sensory Function: Generalized oral weakness (left sided facial droop) Facial ROM: Reduced left Facial Symmetry: Abnormal symmetry left Facial Strength:  (reduced bilaterally) Lingual ROM: Other (Comment) (does not lateralize on command) Lingual Symmetry: Within Functional Limits Lingual Strength: Reduced Mandible:  (open mouth resting posture)   Ice Chips Ice chips: Impaired Presentation: Spoon Oral Phase Impairments: Poor awareness of  bolus;Reduced lingual movement/coordination;Reduced labial seal Oral Phase Functional Implications: Prolonged oral transit Pharyngeal Phase Impairments: Other (comments) (none)   Thin Liquid Thin Liquid: Impaired Presentation: Cup;Straw Oral Phase Impairments: Reduced labial seal;Poor awareness of bolus Oral Phase Functional Implications: Prolonged oral transit Pharyngeal  Phase Impairments: Cough - Immediate (cough after multiple sips) Other Comments: disorganized, poor awareness    Nectar Thick Nectar Thick Liquid: Within functional limits Presentation: Cup;Straw Other Comments: better awareness when holding cup with assistance   Honey Thick Honey Thick Liquid: Not tested   Puree Puree: Impaired Presentation: Spoon Oral Phase Impairments: Poor awareness of bolus;Reduced lingual movement/coordination;Reduced labial seal Oral Phase Functional Implications: Prolonged oral transit Pharyngeal Phase Impairments: Other (comments) (none observed)   Solid    Rondel Baton, MS, CCC-SLP Speech-Language Pathologist  Solid: Not tested      Arlana Lindau 05/15/2020,4:05 PM

## 2020-05-15 NOTE — Progress Notes (Signed)
Rapid Response Consult:  Proactive rounding at this patient with [facility] of [place]. Dr. Allena Katz is aware. Patient is A+Ox self only. BP soft (see flow sheet). Pulses +2 X all four extremities. Patient has received IVF bolus. Will continue to monitor patient's VS trend. Bedside RN, Zoia, instructed to call if patient's condition worsens.

## 2020-05-15 NOTE — Plan of Care (Signed)

## 2020-05-15 NOTE — Progress Notes (Signed)
[facility]  - [place] at [facility]   PATIENT NAME: Elijah [name]    MR#:  [id number]  DATE OF BIRTH:  1933/11/23  SUBJECTIVE:  patient came in from home with increasing confusion. Found to have elevated ammonia. 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  Patient very sleepy and lethargic earlier. Went by to check on him again he was being fed by nurse tech. Answered a few questions. Per RN blood pressure 80/40. Will give bolus 250 mL x 1 RN feels patient is not able to swallow thin liquids. REVIEW OF SYSTEMS:   Review of Systems  Unable to perform ROS: Mental status change   Tolerating Diet:yes Tolerating PT:   DRUG ALLERGIES:  No Known Allergies  VITALS:  Blood pressure (!) 91/57, pulse 79, temperature 98.3 F (36.8 C), temperature source Oral, resp. rate 20, height 6\' 3"  (1.905 m), weight 78.4 kg, SpO2 98 %.  PHYSICAL EXAMINATION:   Physical Exam  GENERAL:  87 y.o.-year-old patient lying in the bed with no acute distress.   LUNGS: Normal breath sounds bilaterally, no wheezing, rales, rhonchi. No use of accessory muscles of respiration.  CARDIOVASCULAR: S1, S2 normal. No murmurs, rubs, or gallops.  ABDOMEN: Soft, nontender, nondistended. Bowel sounds present. No organomegaly or mass. No ascites EXTREMITIES: No cyanosis, clubbing or edema b/l.    NEUROLOGIC: moves all extremities well. A bit lethargic today PSYCHIATRIC:  patient is lethargic SKIN: No obvious rash, lesion, or ulcer.   LABORATORY PANEL:  CBC Recent Labs  Lab 05/14/20 0447  WBC 7.5  HGB 9.8*  HCT 29.9*  PLT 192    Chemistries  Recent Labs  Lab 05/13/20 1252 05/14/20 0447  NA  --  133*  K  --  3.8  CL  --  101  CO2  --  22  GLUCOSE  --  359*  BUN  --  22  CREATININE  --  1.40*  CALCIUM  --  8.2*  AST 46*  --   ALT 44  --   ALKPHOS 293*  --   BILITOT 1.3*  --    Cardiac Enzymes No results  for input(s): TROPONINI in the last 168 hours. RADIOLOGY:  No results found. ASSESSMENT AND PLAN:  TAMARA KENYON is a 87 y.o. male with medical history significant for newly diagnosed liver cirrhosis with variceal bleed status post TIPS procedure, history of coronary artery disease status post recent NSTEMI, BPH and diet-controlled diabetes mellitus who was recently discharged from [facility] about 2 weeks ago and has been home with family receiving home health PT.  His son states that over the last couple of days, patient has been confused and so his son brought him to the ER for evaluation.  Acute Hepatic encephalopathy POA -Patient with a history of liver cirrhosis status post recent TIPS procedure who presents to the ER for evaluation of confusion -He has slightly elevated ammonia levels of 72-->134 --on lactulose 20 g bid --2/23--alert and oriented --2/24-- very sleepy lethargic. Ammonia level 134. I have increased lactulose to 20 g BID  History of liver cirrhosis -Maintain low-sodium diet -Continue spironolactone, propanolol and furosemide -patient had paracentesis few weeks ago. -Follow-up with [facility] as outpatient. Has appointment for March 30-  BPH with urinary retention -Patient had a Foley catheter placed in the ER with drainage of about 600 cc of urine--d/c foley -Continue Flomax  Diabetes mellitus-2, uncontrolled/labile in the  setting of cirrhosis of liver -- A1c 5.4 -- will start Amaryl 2 mg daily and sliding scale insulin three times a day with meals--- this was discussed with patient's son Aaban Griep -Maintain consistent carbohydrate diet  Hypokalemia Secondary to diuretic therapy Supplement potassium  Generalized weakness. Physical therapy recommends rehab-- family wants to take patient home with home health.  DVT prophylaxis: SCDs Code Status: DO NOT RESUSCITATE Family Communication: Greater than 50% of time was spent discussing patient's  condition and plan of care with his son Lenton Gendreau over the phone.   Disposition Plan: Back to previous home environment Consults called: none Admit status: inpatient  Patient has become more lethargic given elevated ammonia. Will need to continue lactulose. Received IV fluids for low blood pressure.    TOTAL TIME TAKING CARE OF THIS PATIENT: *25* minutes.  >50% time spent on counselling and coordination of care  Note: This dictation was prepared with Dragon dictation along with smaller phrase technology. Any transcriptional errors that result from this process are unintentional.  Enedina Finner M.D    [facility]   CC: Primary care physician; Jaclyn Shaggy, MDPatient ID: Elijah [name], male   DOB: 01/13/1934, 87 y.o.   MRN: [id number]

## 2020-05-15 NOTE — Progress Notes (Signed)
   05/15/20 1623  Assess: MEWS Score  Temp 98.3 F (36.8 C)  BP (!) 80/40  ECG Heart Rate 77  Resp 20  Assess: MEWS Score  MEWS Temp 0  MEWS Systolic 2  MEWS Pulse 0  MEWS RR 0  MEWS LOC 0  MEWS Score 2  MEWS Score Color Yellow  Assess: if the MEWS score is Yellow or Red  Were vital signs taken at a resting state? Yes  Focused Assessment No change from prior assessment  Early Detection of Sepsis Score *See Row Information* Low  Treat  MEWS Interventions Other (Comment) (Holding scheduled lasix and BP meds.)  Notify: Provider  Provider Name/Title Enedina Finner, MD  Date Provider Notified 05/15/20  Time Provider Notified 1625  Notification Type Page  Notification Reason Other (Comment) (Hypotension post bolus)  Provider response Other (Comment) (Dr. Allena Katz asked to hold BP med and Lasix)  Date of Provider Response 05/15/20  Time of Provider Response 1625

## 2020-05-15 NOTE — Progress Notes (Addendum)
Correction:  Provider Name Enedina Finner, MD.      05/15/20 1455  Assess: MEWS Score  BP (!) 80/40  Assess: MEWS Score  MEWS Temp 0  MEWS Systolic 2  MEWS Pulse 0  MEWS RR 0  MEWS LOC 0  MEWS Score 2  MEWS Score Color Yellow  Assess: if the MEWS score is Yellow or Red  Were vital signs taken at a resting state? Yes  Focused Assessment No change from prior assessment  Early Detection of Sepsis Score *See Row Information* Low  MEWS guidelines implemented *See Row Information* Yes  Treat  MEWS Interventions Administered scheduled meds/treatments  Pain Scale Faces  Faces Pain Scale 0  Take Vital Signs  Increase Vital Sign Frequency  Yellow: Q 2hr X 2 then Q 4hr X 2, if remains yellow, continue Q 4hrs  Escalate  MEWS: Escalate Yellow: discuss with charge nurse/RN and consider discussing with provider and RRT  Notify: Charge Nurse/RN  Name of Charge Nurse/RN Notified Anabelle, RN  Date Charge Nurse/RN Notified 05/15/20  Time Charge Nurse/RN Notified 1455  Notify: Provider  Provider Name/Title Sona, RN  Date Provider Notified 05/15/20  Time Provider Notified 1455  Notification Type Page  Notification Reason Other (Comment) (IV infiltration and need of another bolus)  Provider response See new orders (250 NS bolus)  Date of Provider Response 05/15/20  Time of Provider Response 1500

## 2020-05-16 DIAGNOSIS — K7290 Hepatic failure, unspecified without coma: Secondary | ICD-10-CM | POA: Diagnosis not present

## 2020-05-16 LAB — AEROBIC CULTURE W GRAM STAIN (SUPERFICIAL SPECIMEN)

## 2020-05-16 LAB — GLUCOSE, CAPILLARY
Glucose-Capillary: 222 mg/dL — ABNORMAL HIGH (ref 70–99)
Glucose-Capillary: 321 mg/dL — ABNORMAL HIGH (ref 70–99)
Glucose-Capillary: 329 mg/dL — ABNORMAL HIGH (ref 70–99)
Glucose-Capillary: 214 mg/dL — ABNORMAL HIGH (ref 70–99)

## 2020-05-16 LAB — AMMONIA: Ammonia: 47 umol/L — ABNORMAL HIGH (ref 9–35)

## 2020-05-16 NOTE — Progress Notes (Signed)
Notified Manuela Schwartz regarding patient's low blood pressures. Instructed to monitor because his systolic BP is in the 90's for now.  Arturo Morton

## 2020-05-16 NOTE — Plan of Care (Signed)
  Problem: Clinical Measurements: Goal: Diagnostic test results will improve Outcome: Progressing   

## 2020-05-16 NOTE — Progress Notes (Signed)
Inpatient Diabetes Program Recommendations  AACE/ADA: New Consensus Statement on Inpatient Glycemic Control (2015)  Target Ranges:  Prepandial:   less than 140 mg/dL      Peak postprandial:   less than 180 mg/dL (1-2 hours)      Critically ill patients:  140 - 180 mg/dL   Lab Results  Component Value Date   GLUCAP 321 (H) 05/16/2020   HGBA1C 5.4 04/18/2020    Review of Glycemic Control Results for Elijah [name], Elijah [name] (MRN [id number]) as of 05/16/2020 12:21  Ref. Range 05/15/2020 12:17 05/15/2020 16:26 05/15/2020 21:01 05/16/2020 07:53 05/16/2020 11:58  Glucose-Capillary Latest Ref Range: 70 - 99 mg/dL 846 (H) 659 (H) 935 (H) 222 (H) 321 (H)  Diabetes history: DM 2 Outpatient Diabetes medications:  Amaryl 2 mg daily Current orders for Inpatient glycemic control:  Novolog sensitive tid with meals and HS Amaryl 2 mg daily  Inpatient Diabetes Program Recommendations:    If appropriate, consider adding Levemir 8 units daily.   Thanks  Beryl Meager, RN, BC-ADM Inpatient Diabetes Coordinator Pager [phone] (8a-5p)

## 2020-05-16 NOTE — Progress Notes (Addendum)
Speech Language Pathology Treatment: Dysphagia  Patient Details Name: Elijah [name] MRN: [id number] DOB: Jul 23, 1933 Today's Date: 05/16/2020 Time: 5573-2202 SLP Time Calculation (min) (ACUTE ONLY): 50 min  Assessment / Plan / Recommendation Clinical Impression  Pt seen for ongoing assessment of swallowing. He appears much improved and alert today; verbally responsive and able to follow instructions w/ min-mod cues. Pt is on RA; wbc wnl. Head CT: Age related volume loss with slight periventricular small vessel disease. No acute infarct. Pt has a Baseline of Large Esophageal Varices per EGD on 04/18/20. Currently, he exhibits mild confusion needing general cueing during tasks -- Unsure of pt's Baseline Cognitive functioning d/t natural changes post h/o multiple infarcts/CVA in 2015. Pt is also now Missing his Lower Denture Plate for effective mastication of solids.  Upon meeting pt, he was slouched down in bed w/ open-mouth posture at rest - lower lip hanging open, low. He stated he did not know where the lower Denture plate was but that it "did not fit right anyway". Open-mouth posture at rest can be noted w/ overall dec'd tone and Cognitive decline. Pt was explained general aspiration precautions and agreed verbally to the need for following them especially sitting upright for all oral intake, small sips/bites. Pt assisted w/ positioning d/t not helping himself and d/t overall weakness. He was then given trials of thin liquids VIA CUP, ice chips, purees and soft/broken solids. NO overt clinical s/s of aspiration were noted w/ any consistency; respiratory status remained calm and unlabored, vocal quality clear b/t trials. Pt given tray setup then fed himself and held Cup when drinking following instructions for single, small sips slowly the majority of the time. NO straws were utiilized for better oral control and less air swallowed. Oral phase appeared grossly [facility] for bolus management and timely A-P  transfer for swallowing of food consistencies given; oral clearing achieved w/ all consistencies. Belching occurred frequently during the meal. Recommend upgrade to Dysphagia level 2 diet (minced diet) w/ gravies added to moisten foods; Thin liquids VIA CUP ONLY. Recommend general aspiration precautions; Pills Whole in Puree for better cohesion and safety swallowing; tray setup and positioning assistance for meals -- pt can feed himself but monitor as needed for following aspiration precautions. REFLUX PRECAUTIONS d/t Varices -- NO carbonated sodas/drinks rec'd during meals. ST services will be available for any new needs while admitted. Precautions posted at bedside. This diet consistency would be best and safest for pt d/t the initial medical issues mentioned above. Recommend Supervision w/ preparing meals and swallowing Pills at Home, d/c. Team updated.  Addendum:  Handouts on diet and aspiration precautions recommendations left in room for family for d/c home, as per SW report(plan for pt to d/c home w/ family Supervision).     HPI HPI: Patient is an 87 y.o. male admitted from home with confusion, acute hepatic encephalopathy, urinary retention. Recently hospitalized with cirrhosis of liver with Large Esophageal Varices per EGD on 04/18/20, underwent TIPS procedure at [facility] ~2 weeks ago. CT head negative for acute infarct. Hx also noted for multiple infarcts/CVA in 2015.  Unsure of pt's now Baseline Cognitive status.      SLP Plan  All goals met       Recommendations  Diet recommendations: Dysphagia 2 (fine chop);Thin liquid (moistened; NO straw) Liquids provided via: Cup;No straw Medication Administration: Whole meds with puree (for safer swallowing) Supervision: Patient able to self feed;Intermittent supervision to cue for compensatory strategies (tray setup) Compensations: Minimize environmental distractions;Slow rate;Small  sips/bites;Lingual sweep for clearance of pocketing;Follow  solids with liquid Postural Changes and/or Swallow Maneuvers: Seated upright 90 degrees;Upright 30-60 min after meal;Out of bed for meals                General recommendations:  (Dietician f/u for support) Oral Care Recommendations: Oral care BID;Oral care before and after PO;Staff/trained caregiver to provide oral care (Dentures) Follow up Recommendations:  (TBD by SW, PT) SLP Visit Diagnosis: Dysphagia, oral phase (R13.11) (no bottom dentures; Cognitive status/awareness) Plan: All goals met       GO                  Orinda Kenner, MS, CCC-SLP Speech Language Pathologist Rehab Services [phone] [facility] 05/16/2020, 2:01 PM

## 2020-05-16 NOTE — Progress Notes (Signed)
[facility]  - [place] at [facility]   PATIENT NAME: Elijah [name]    MR#:  [id number]  DATE OF BIRTH:  [birth year]  SUBJECTIVE:  patient came in from home with increasing confusion. Found to have elevated ammonia. Newly diagnosed cirrhosis of liver with large esophageal variaces underwent tips procedure at [facility] two weeks ago.  Patient awake and alert. Fed himself his diet. Much improved from yesterday. Blood pressure soft but  stable. REVIEW OF SYSTEMS:   Review of Systems  Unable to perform ROS: Mental status change   Tolerating Diet:yes Tolerating PT: SNF  DRUG ALLERGIES:  No Known Allergies  VITALS:  Blood pressure (!) 99/56, pulse 72, temperature 97.8 F (36.6 C), temperature source Oral, resp. rate 16, height 6\' 3"  (1.905 m), weight 78.4 kg, SpO2 99 %.  PHYSICAL EXAMINATION:   Physical Exam  GENERAL:  87 y.o.-year-old patient lying in the bed with no acute distress.   LUNGS: Normal breath sounds bilaterally, no wheezing, rales, rhonchi. No use of accessory muscles of respiration.  CARDIOVASCULAR: S1, S2 normal. No murmurs, rubs, or gallops.  ABDOMEN: Soft, nontender, nondistended. Bowel sounds present. No organomegaly or mass. No ascites, chronically enlarged prostate EXTREMITIES: No cyanosis, clubbing or edema b/l.    NEUROLOGIC: . Moves all extremities well. Nonfocal PSYCHIATRIC:  patient is alert SKIN: No obvious rash, lesion, or ulcer.   LABORATORY PANEL:  CBC Recent Labs  Lab 05/14/20 0447  WBC 7.5  HGB 9.8*  HCT 29.9*  PLT 192    Chemistries  Recent Labs  Lab 05/13/20 1252 05/14/20 0447  NA  --  133*  K  --  3.8  CL  --  101  CO2  --  22  GLUCOSE  --  359*  BUN  --  22  CREATININE  --  1.40*  CALCIUM  --  8.2*  AST 46*  --   ALT 44  --   ALKPHOS 293*  --   BILITOT 1.3*  --    Cardiac Enzymes No results for input(s): TROPONINI in the last 168 hours. RADIOLOGY:  No results found. ASSESSMENT AND PLAN:  Elijah [name] is a 87 y.o. male with medical history significant for newly diagnosed liver cirrhosis with variceal bleed status post TIPS procedure, history of coronary artery disease status post recent NSTEMI, BPH and diet-controlled diabetes mellitus who was recently discharged from [facility] about 2 weeks ago and has been home with family receiving home health PT.  His son states that over the last couple of days, patient has been confused and so his son brought him to the ER for evaluation.  Acute Hepatic encephalopathy POA -Patient with a history of liver cirrhosis status post recent TIPS procedure who presents to the ER for evaluation of confusion -He has slightly elevated ammonia levels of 72-->134--47 --on lactulose 20 g bid --2/23--alert and oriented --2/24-- very sleepy lethargic. Ammonia level 134. I have increased lactulose to 20 g BID --2/25-- mentation much improved. Patient feeding himself. Ate pretty good. Blood pressure soft stable.  History of liver cirrhosis -Maintain low-sodium diet -Continue spironolactone, propanolol and furosemide -patient had paracentesis few weeks ago. -Follow-up with [facility] as outpatient. Has appointment for March 30-  BPH with urinary retention chronically enlarged scrotum -Patient had a Foley catheter placed in the ER with drainage of about 600 cc of urine--d/c foley -Continue Flomax  Diabetes mellitus-2, uncontrolled/labile in the setting of cirrhosis of liver -- A1c 5.4 -- will  start Amaryl 2 mg daily and sliding scale insulin three times a day with meals--- this was discussed with patient's son Elijah [name] -Maintain consistent carbohydrate diet  Hypokalemia Secondary to diuretic therapy Supplement potassium  Generalized weakness. Physical therapy recommends rehab-- family wants to take patient home with home health.  DVT prophylaxis: SCDs Code Status: DO NOT RESUSCITATE Family Communication: Greater than 50% of time was  spent discussing patient's condition and plan of care with his son Elijah [name] over the phone 2/25 Disposition Plan: Back to previous home environment likely tomorrow Consults called: none Admit status: inpatient  Continues to show improvement. I will keep him one more day to monitor his mentation and if remains stable will discharge him to home tomorrow. Discussed with son over the phone today.  TOTAL TIME TAKING CARE OF THIS PATIENT: *25* minutes.  >50% time spent on counselling and coordination of care  Note: This dictation was prepared with Dragon dictation along with smaller phrase technology. Any transcriptional errors that result from this process are unintentional.  Enedina Finner M.D    [facility]   CC: Primary care physician; Elijah [name], MDPatient ID: Elijah [name], male   DOB: [birth year], 87 y.o.   MRN: [id number]

## 2020-05-17 DIAGNOSIS — K7290 Hepatic failure, unspecified without coma: Secondary | ICD-10-CM | POA: Diagnosis not present

## 2020-05-17 LAB — GLUCOSE, CAPILLARY: Glucose-Capillary: 165 mg/dL — ABNORMAL HIGH (ref 70–99)

## 2020-05-17 MED ORDER — FUROSEMIDE 40 MG PO TABS: 40.0000 mg | ORAL_TABLET | Freq: Two times a day (BID) | ORAL | 2 refills | Status: AC

## 2020-05-17 MED ORDER — LACTULOSE 10 GM/15ML PO SOLN: 20.0000 g | Freq: Every day | ORAL | 3 refills | Status: AC

## 2020-05-17 MED ORDER — LACTULOSE 10 GM/15ML PO SOLN
20.0000 g | Freq: Every day | ORAL | Status: DC
  Administered 2020-05-17: 20 g via ORAL
  Filled 2020-05-17: qty 30

## 2020-05-17 NOTE — Plan of Care (Signed)
  Problem: Health Behavior/Discharge Planning: Goal: Ability to manage health-related needs will improve Outcome: Progressing   

## 2020-05-17 NOTE — Progress Notes (Signed)
Lower set of dentures were not found. Left message for Patient Experience [phone] to contact Son on Monday. Also contacted Son to give update.   Madie Reno, RN

## 2020-05-17 NOTE — Progress Notes (Signed)
[name] to be D/C'd Home per MD order.  Discussed prescriptions and follow up appointments with the patient's Son. Prescriptions given to patient's son, medication list explained in detail. Patient's son verbalized understanding.  Allergies as of 05/17/2020   No Known Allergies     Medication List    TAKE these medications   furosemide 40 MG tablet Commonly known as: LASIX Take 1 tablet (40 mg total) by mouth 2 (two) times daily.   glimepiride 4 MG tablet Commonly known as: AMARYL Take 2 mg by mouth daily with breakfast.   lactulose 10 GM/15ML solution Commonly known as: CHRONULAC Take 30 mLs (20 g total) by mouth daily.   Melatonin 10 MG Tabs Take 10 mg by mouth at bedtime.   pantoprazole 40 MG tablet Commonly known as: PROTONIX Take 1 tablet (40 mg total) by mouth 2 (two) times daily.   propranolol 10 MG tablet Commonly known as: INDERAL Take 1 tablet (10 mg total) by mouth 2 (two) times daily.   spironolactone 50 MG tablet Commonly known as: ALDACTONE Take 1 tablet (50 mg total) by mouth daily.   tamsulosin 0.4 MG Caps capsule Commonly known as: FLOMAX Take 0.4 mg by mouth at bedtime.            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   The above medical condition requires: Patient requires the ability to reposition frequently   Head must be elevated greater than: 30 degrees   Bed type Semi-electric   Hoyer Lift Yes   Support Surface: Gel Overlay      05/14/20 1525           Discharge Care Instructions  (From admission, onward)         Start     Ordered   05/17/20 0000  Discharge wound care:       Comments: Foam padding   05/17/20 0854          Vitals:   05/17/20 0339 05/17/20 1011  BP:  101/68 (!) 115/59  Pulse: 78 86  Resp: 16 17  Temp: 97.8 F (36.6 C) 98.2 F (36.8 C)  SpO2: 97% 99%    Skin clean, dry and intact without evidence of skin break down, no evidence of skin tears noted. IV catheter discontinued intact. Site without signs and symptoms of complications. Dressing and pressure applied. Pt denies pain at this time. No complaints noted.  An After Visit Summary was printed and given to the patient. Patient escorted via WC, and D/C home via private auto.  Madie Reno, RN

## 2020-05-17 NOTE — Discharge Summary (Signed)
[facility] [place] [facility]   PATIENT NAME: Elijah [name]    MR#:  [id number]  DATE OF BIRTH:  [birth year]  DATE OF ADMISSION:  05/13/2020 ADMITTING PHYSICIAN: Lucile Shutters, MD  DATE OF DISCHARGE: 05/17/2020  PRIMARY CARE PHYSICIAN: Jaclyn Shaggy, MD    ADMISSION DIAGNOSIS:  Hepatic encephalopathy (HCC) [K72.90]  DISCHARGE DIAGNOSIS:  Acute Hepatic Encephalopathy--improved Cirrhosis of liver s/p recent TIPS   SECONDARY DIAGNOSIS:   Past Medical History:  Diagnosis Date  . Coronary artery disease   . Diabetes mellitus without complication [facility])     HOSPITAL COURSE:  Elijah Zaldivar Crawfordis a 86 y.o.malewith medical history significant fornewly diagnosed liver cirrhosis with variceal bleed status post TIPS procedure, history of coronary artery disease status post recent NSTEMI, BPH and diet-controlled diabetes mellitus who was recently discharged from [facility] about 2 weeks ago and has been home with family receiving home health PT. His son states that over the last couple of days,patient has been confused and so his son brought him to the ER for evaluation.  Acute Hepatic encephalopathy POA -Patient with a history of liver cirrhosis status post recent TIPS procedure who presents to the ER for evaluation of confusion -He has slightly elevated ammonia levels of 72-->134--47 --on lactulose 20 g bid --2/23--alert and oriented --2/24-- very sleepy lethargic. Ammonia level 134. I have increased lactulose to 20 g BID --2/25-- mentation much improved. Patient feeding himself. Ate pretty good. Blood pressure soft stable. --2/26--mentation back to normal. Eating good BF  History of liver cirrhosis -Maintain low-sodium diet -Continue spironolactone, propanolol and furosemide -patient had paracentesis few weeks ago. -Follow-up with [facility] as outpatient. Has appointment for March 30-  BPH with urinary retention chronically enlarged  scrotum -Patient had a Foley catheter placed in the ER with drainage of about 600 cc of urine--d/c foley -Continue Flomax  Diabetes mellitus-2, uncontrolled/labile in the setting of cirrhosis of liver -- A1c 5.4 -- will  cont Amaryl 2 mg daily and sliding scale insulin three times a day with meals--- this was discussed with patient's son Elijah [name] -Maintain consistent carbohydrate diet -sugars reasonably ok--variable  Hypokalemia Secondary to diuretic therapy Supplement potassium  Generalized weakness. Physical therapy recommends rehab-- family wants to take patient home with home health. Hospital Bed for DME  DVT prophylaxis:SCDs Code Status:DO NOT RESUSCITATE Family Communication:Greater than 50% of time was spent discussing patient's condition and plan of care with his son Elijah [name] the phone 2/26 Disposition Plan:Back to previous home environment today Consults called:none Admit status: inpatient  Overall improved. D/c to home today CONSULTS OBTAINED:    DRUG ALLERGIES:  No Known Allergies  DISCHARGE MEDICATIONS:   Allergies as of 05/17/2020   No Known Allergies     Medication List    TAKE these medications   furosemide 40 MG tablet Commonly known as: LASIX Take 1 tablet (40 mg total) by mouth 2 (two) times daily.   glimepiride 4 MG tablet Commonly known as: AMARYL Take 2 mg by mouth daily with breakfast.   lactulose 10 GM/15ML solution Commonly known as: CHRONULAC Take 30 mLs (20 g total) by mouth daily.   Melatonin 10 MG Tabs Take 10 mg by mouth at bedtime.   pantoprazole 40 MG tablet Commonly known as: PROTONIX Take 1 tablet (40 mg total) by mouth 2 (two) times daily.   propranolol 10 MG tablet Commonly known as: INDERAL Take 1 tablet (10 mg total) by mouth 2 (two) times daily.  spironolactone 50 MG tablet Commonly known as: ALDACTONE Take 1 tablet (50 mg total) by mouth daily.   tamsulosin 0.4 MG Caps  capsule Commonly known as: FLOMAX Take 0.4 mg by mouth at bedtime.            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   The above medical condition requires: Patient requires the ability to reposition frequently   Head must be elevated greater than: 30 degrees   Bed type Semi-electric   Hoyer Lift Yes   Support Surface: Gel Overlay      05/14/20 1525           Discharge Care Instructions  (From admission, onward)         Start     Ordered   05/17/20 0000  Discharge wound care:       Comments: Foam padding   05/17/20 0854          If you experience worsening of your admission symptoms, develop shortness of breath, life threatening emergency, suicidal or homicidal thoughts you must seek medical attention immediately by calling 911 or calling your MD immediately  if symptoms less severe.  You Must read complete instructions/literature along with all the possible adverse reactions/side effects for all the Medicines you take and that have been prescribed to you. Take any new Medicines after you have completely understood and accept all the possible adverse reactions/side effects.   Please note  You were cared for by a hospitalist during your hospital stay. If you have any questions about your discharge medications or the care you received while you were in the hospital after you are discharged, you can call the unit and asked to speak with the hospitalist on call if the hospitalist that took care of you is not available. Once you are discharged, your primary care physician will handle any further medical issues. Please note that NO REFILLS for any discharge medications will be authorized once you are discharged, as it is imperative  that you return to your primary care physician (or establish a relationship with a primary care physician if you do not have one) for your aftercare needs so that they can reassess your need for medications and monitor your lab values. Today   SUBJECTIVE   Doing well. No new issues per RN  VITAL SIGNS:  Blood pressure 101/68, pulse 78, temperature 97.8 F (36.6 C), temperature source Oral, resp. rate 16, height  (1.905 m), weight 78.4 kg, SpO2 97 %.  I/O:    Intake/Output Summary (Last 24 hours) at 05/17/2020 0856 Last data filed at 05/16/2020 1700 Gross per 24 hour  Intake 780 ml  Output -  Net 780 ml    PHYSICAL EXAMINATION:  GENERAL:  87 y.o.-year-old patient lying in the bed with no acute distress.   LUNGS: Normal breath sounds bilaterally, no wheezing, rales,rhonchi or crepitation. No use of accessory muscles of respiration.  CARDIOVASCULAR: S1, S2 normal. No murmurs, rubs, or gallops.  ABDOMEN: Soft, non-tender, non-distended. Bowel sounds present. Chronically enlarged scrotum EXTREMITIES: No pedal edema, cyanosis, or clubbing.  NEUROLOGIC: grossly nonfocal PSYCHIATRIC: The patient is alert and oriented x 3.  SKIN:  Pressure Ulcer 09/11/13 Stage II -  Partial thickness loss of dermis presenting as a shallow open ulcer with a red, pink wound bed without slough. This is NOT a pressure ulcer; it is moisture associated skin damage and candiditas (Active)  09/11/13 1400  Location: Sacrum  Location Orientation:   Staging: Stage II -  Partial thickness loss of dermis presenting as a shallow open ulcer with a red, pink wound bed without slough.  Wound Description (Comments): This is NOT a pressure ulcer; it is moisture associated skin damage and candiditas  Present on Admission: Yes     Pressure Ulcer 09/11/13 Deep Tissue Injury - Purple or maroon localized area of discolored intact skin or blood-filled blister due to damage of underlying soft tissue from pressure and/or  shear. (Active)  09/11/13 1400  Location: Heel  Location Orientation: Right;Left  Staging: Deep Tissue Injury - Purple or maroon localized area of discolored intact skin or blood-filled blister due to damage of underlying soft tissue from pressure and/or shear.  Wound Description (Comments):   Present on Admission: Yes     Pressure Injury 05/13/20 Buttocks Right;Left;Mid Stage 1 -  Intact skin with non-blanchable redness of a localized area usually over a bony prominence. Stage 1 pressure ulcer non blanchable (Active)  05/13/20 1744  Location: Buttocks  Location Orientation: Right;Left;Mid  Staging: Stage 1 -  Intact skin with non-blanchable redness of a localized area usually over a bony prominence.  Wound Description (Comments): Stage 1 pressure ulcer non blanchable  Present on Admission: Yes        DATA REVIEW:   CBC  Recent Labs  Lab 05/14/20 0447  WBC 7.5  HGB 9.8*  HCT 29.9*  PLT 192    Chemistries  Recent Labs  Lab 05/13/20 1252 05/14/20 0447  NA  --  133*  K  --  3.8  CL  --  101  CO2  --  22  GLUCOSE  --  359*  BUN  --  22  CREATININE  --  1.40*  CALCIUM  --  8.2*  AST 46*  --   ALT 44  --   ALKPHOS 293*  --   BILITOT 1.3*  --     Microbiology Results   Recent Results (from the past 240 hour(s))  Resp Panel by RT-PCR (Flu A&B, Covid) Nasopharyngeal Swab     Status: None   Collection Time: 05/13/20  2:44 PM   Specimen: Nasopharyngeal Swab; Nasopharyngeal(NP) swabs in vial transport medium  Result Value Ref Range Status   SARS Coronavirus 2 by RT PCR NEGATIVE NEGATIVE Final    Comment: (NOTE) SARS-CoV-2 target nucleic acids are NOT DETECTED.  The SARS-CoV-2 RNA is generally detectable in upper respiratory specimens during the acute phase of infection. The lowest concentration of SARS-CoV-2 viral copies this assay can detect is 138 copies/mL. A negative result does not preclude SARS-Cov-2 infection and should not be used as the sole basis for  treatment or other patient management decisions. A negative result may occur with  improper specimen collection/handling, submission of specimen other than nasopharyngeal swab, presence of viral mutation(s) within the areas targeted by this assay, and inadequate number of viral copies(<138 copies/mL). A negative result must be combined with clinical observations, patient history, and epidemiological information. The expected result is Negative.  Fact Sheet for Patients:  BloggerCourse.com  Fact Sheet for Healthcare Providers:  SeriousBroker.it  This test is no t yet approved or cleared by the [place] FDA and  has  been authorized for detection and/or diagnosis of SARS-CoV-2 by FDA under an Emergency Use Authorization (EUA). This EUA will remain  in effect (meaning this test can be used) for the duration of the COVID-19 declaration under Section 564(b)(1) of the Act, 21 U.S.C.section 360bbb-3(b)(1), unless the authorization is terminated  or revoked sooner.       Influenza A by PCR NEGATIVE NEGATIVE Final   Influenza B by PCR NEGATIVE NEGATIVE Final    Comment: (NOTE) The Xpert Xpress SARS-CoV-2/FLU/RSV plus assay is intended as an aid in the diagnosis of influenza from Nasopharyngeal swab specimens and should not be used as a sole basis for treatment. Nasal washings and aspirates are unacceptable for Xpert Xpress SARS-CoV-2/FLU/RSV testing.  Fact Sheet for Patients: BloggerCourse.com  Fact Sheet for Healthcare Providers: SeriousBroker.it  This test is not yet approved or cleared by the [place] FDA and has been authorized for detection and/or diagnosis of SARS-CoV-2 by FDA under an Emergency Use Authorization (EUA). This EUA will remain in effect (meaning this test can be used) for the duration of the COVID-19 declaration under Section 564(b)(1) of the Act, 21  U.S.C. section 360bbb-3(b)(1), unless the authorization is terminated or revoked.  Performed at [facility], [address] [place], [place] [address]   Aerobic Culture w Gram Stain (superficial specimen)     Status: None   Collection Time: 05/13/20  5:34 PM   Specimen: Wound  Result Value Ref Range Status   Specimen Description   Final    WOUND Performed at [facility], [address], [place], [place] [address]    Special Requests   Final    PERN Performed at [facility], [address] [place], [place] [address]    Gram Stain   Final    FEW WBC PRESENT, PREDOMINANTLY PMN ABUNDANT GRAM POSITIVE COCCI ABUNDANT GRAM NEGATIVE RODS ABUNDANT GRAM POSITIVE RODS Performed at [facility], [address], [place], [place] [address]    Culture   Final    FEW PSEUDOMONAS AERUGINOSA FEW ENTEROCOCCUS FAECALIS    Report Status 05/16/2020 FINAL  Final   Organism ID, Bacteria PSEUDOMONAS AERUGINOSA  Final   Organism ID, Bacteria ENTEROCOCCUS FAECALIS  Final      Susceptibility   Enterococcus faecalis - MIC*    AMPICILLIN <=2 SENSITIVE Sensitive     VANCOMYCIN 1 SENSITIVE Sensitive     GENTAMICIN SYNERGY SENSITIVE Sensitive     * FEW ENTEROCOCCUS FAECALIS   Pseudomonas aeruginosa - MIC*    CEFTAZIDIME >=64 RESISTANT Resistant     CIPROFLOXACIN <=0.25 SENSITIVE Sensitive     GENTAMICIN <=1 SENSITIVE Sensitive     IMIPENEM 2 SENSITIVE Sensitive     * FEW PSEUDOMONAS AERUGINOSA    RADIOLOGY:  No results found.   CODE STATUS:     Code Status Orders  (From admission, onward)         Start     Ordered   05/13/20 1436  Do not attempt resuscitation (DNR)  Continuous       Question Answer Comment  In the event of cardiac or respiratory ARREST Do not call a "code blue"   In the event of cardiac or respiratory ARREST Do not perform Intubation, CPR, defibrillation or ACLS   In the event of cardiac or respiratory ARREST Use medication by  any route, position, wound care, and other measures to relive pain and suffering. May use oxygen, suction and manual treatment of airway obstruction as needed  for comfort.   Comments Discussed code status with patient's son Delrae RendBarry Erbe and patient is a DNR      05/13/20 1437        Code Status History    Date Active Date Inactive Code Status Order ID Comments User Context   05/13/2020 1422 05/13/2020 1437 Full Code [id number]  Lucile ShuttersAgbata, Tochukwu, MD ED   04/18/2020 2226 04/27/2020 2124 DNR [id number]  Darlin DropHall, Carole N, DO Inpatient   04/17/2020 2159 04/18/2020 2117 Full Code [id number]  Rust-Chester, Cecelia ByarsBritton L, NP ED   09/11/2013 1430 10/10/2013 1659 Full Code [id number]  Charlton AmorAngiulli, Daniel J, PA-C Inpatient   Advance Care Planning Activity       TOTAL TIME TAKING CARE OF THIS PATIENT: *35* minutes.    Enedina FinnerSona Patel M.D  [facility]    CC: Primary care physician; Jaclyn Shaggyate, Denny C, MD

## 2020-05-17 NOTE — Progress Notes (Signed)
Patient was more alert tonight, oriented to person and place. Was able to take pills whole with water and ask for a bed pan as needed.  Will continue to monitor.  Arturo Morton

## 2020-05-17 NOTE — Discharge Instructions (Signed)
   Diet rec'd at D/C:  Dysphagia level 2 diet (minced diet) w/ gravies added to moisten foods; Thin liquids VIA CUP ONLY. Recommend general aspiration precautions; Pills Whole in Puree for better cohesion and safety swallowing; tray setup and positioning assistance for meals -- pt can feed himself but monitor as needed for following aspiration and Reflux precautions. REFLUX PRECAUTIONS d/t Varices -- NO carbonated sodas/drinks rec'd during meals.

## 2020-05-17 NOTE — Progress Notes (Signed)
Patient missing lower set of dentures. Unfortunately dentures were not found in patient's room. Per Son, patient was brought in with both upper and lower dentures. Will continue to look around in room.   Madie Reno, RN

## 2020-06-20 DEATH — deceased
